# Patient Record
Sex: Male | Born: 1965 | Hispanic: No | Smoking: Former smoker
Health system: Southern US, Community
[De-identification: ages and names within clinical notes are randomized; demographics above are authoritative.]

## PROBLEM LIST (undated history)

## (undated) DIAGNOSIS — E113419 Type 2 diabetes mellitus with severe nonproliferative diabetic retinopathy with macular edema, unspecified eye: Secondary | ICD-10-CM

## (undated) DIAGNOSIS — E119 Type 2 diabetes mellitus without complications: Secondary | ICD-10-CM

## (undated) DIAGNOSIS — R269 Unspecified abnormalities of gait and mobility: Secondary | ICD-10-CM

## (undated) DIAGNOSIS — E1122 Type 2 diabetes mellitus with diabetic chronic kidney disease: Secondary | ICD-10-CM

## (undated) DIAGNOSIS — Z95810 Presence of automatic (implantable) cardiac defibrillator: Secondary | ICD-10-CM

## (undated) DIAGNOSIS — N183 Chronic kidney disease, stage 3 (moderate): Secondary | ICD-10-CM

## (undated) DIAGNOSIS — Z794 Long term (current) use of insulin: Secondary | ICD-10-CM

## (undated) DIAGNOSIS — I129 Hypertensive chronic kidney disease with stage 1 through stage 4 chronic kidney disease, or unspecified chronic kidney disease: Secondary | ICD-10-CM

## (undated) DIAGNOSIS — G2581 Restless legs syndrome: Secondary | ICD-10-CM

## (undated) DIAGNOSIS — E1142 Type 2 diabetes mellitus with diabetic polyneuropathy: Secondary | ICD-10-CM

## (undated) DIAGNOSIS — I499 Cardiac arrhythmia, unspecified: Secondary | ICD-10-CM

## (undated) DIAGNOSIS — E1140 Type 2 diabetes mellitus with diabetic neuropathy, unspecified: Secondary | ICD-10-CM

## (undated) DIAGNOSIS — E785 Hyperlipidemia, unspecified: Secondary | ICD-10-CM

## (undated) DIAGNOSIS — E669 Obesity, unspecified: Secondary | ICD-10-CM

## (undated) DIAGNOSIS — M21371 Foot drop, right foot: Secondary | ICD-10-CM

## (undated) DIAGNOSIS — G5620 Lesion of ulnar nerve, unspecified upper limb: Secondary | ICD-10-CM

## (undated) DIAGNOSIS — F32A Depression, unspecified: Secondary | ICD-10-CM

## (undated) DIAGNOSIS — F329 Major depressive disorder, single episode, unspecified: Secondary | ICD-10-CM

## (undated) DIAGNOSIS — E1169 Type 2 diabetes mellitus with other specified complication: Secondary | ICD-10-CM

## (undated) DIAGNOSIS — L0390 Cellulitis, unspecified: Secondary | ICD-10-CM

## (undated) DIAGNOSIS — E1165 Type 2 diabetes mellitus with hyperglycemia: Secondary | ICD-10-CM

## (undated) DIAGNOSIS — G4762 Sleep related leg cramps: Secondary | ICD-10-CM

## (undated) DIAGNOSIS — K859 Acute pancreatitis without necrosis or infection, unspecified: Secondary | ICD-10-CM

## (undated) DIAGNOSIS — F419 Anxiety disorder, unspecified: Secondary | ICD-10-CM

## (undated) DIAGNOSIS — I2510 Atherosclerotic heart disease of native coronary artery without angina pectoris: Secondary | ICD-10-CM

## (undated) DIAGNOSIS — Z8739 Personal history of other diseases of the musculoskeletal system and connective tissue: Secondary | ICD-10-CM

## (undated) DIAGNOSIS — R4020 Unspecified coma: Secondary | ICD-10-CM

## (undated) DIAGNOSIS — E7800 Pure hypercholesterolemia, unspecified: Secondary | ICD-10-CM

## (undated) DIAGNOSIS — I4901 Ventricular fibrillation: Secondary | ICD-10-CM

## (undated) DIAGNOSIS — R51 Headache: Secondary | ICD-10-CM

## (undated) DIAGNOSIS — N189 Chronic kidney disease, unspecified: Secondary | ICD-10-CM

## (undated) DIAGNOSIS — G4733 Obstructive sleep apnea (adult) (pediatric): Secondary | ICD-10-CM

## (undated) DIAGNOSIS — Z950 Presence of cardiac pacemaker: Secondary | ICD-10-CM

## (undated) DIAGNOSIS — I255 Ischemic cardiomyopathy: Secondary | ICD-10-CM

## (undated) DIAGNOSIS — N184 Chronic kidney disease, stage 4 (severe): Secondary | ICD-10-CM

## (undated) DIAGNOSIS — G5601 Carpal tunnel syndrome, right upper limb: Secondary | ICD-10-CM

## (undated) DIAGNOSIS — H3320 Serous retinal detachment, unspecified eye: Secondary | ICD-10-CM

## (undated) DIAGNOSIS — I10 Essential (primary) hypertension: Secondary | ICD-10-CM

## (undated) DIAGNOSIS — G629 Polyneuropathy, unspecified: Secondary | ICD-10-CM

## (undated) DIAGNOSIS — G4761 Periodic limb movement disorder: Secondary | ICD-10-CM

## (undated) DIAGNOSIS — M21372 Foot drop, left foot: Secondary | ICD-10-CM

## (undated) DIAGNOSIS — E1111 Type 2 diabetes mellitus with ketoacidosis with coma: Secondary | ICD-10-CM

## (undated) DIAGNOSIS — E079 Disorder of thyroid, unspecified: Secondary | ICD-10-CM

## (undated) DIAGNOSIS — I5022 Chronic systolic (congestive) heart failure: Secondary | ICD-10-CM

## (undated) DIAGNOSIS — E1129 Type 2 diabetes mellitus with other diabetic kidney complication: Secondary | ICD-10-CM

## (undated) HISTORY — DX: Major depressive disorder, single episode, unspecified: F32.9

## (undated) HISTORY — DX: Pure hypercholesterolemia, unspecified: E78.00

## (undated) HISTORY — PX: OTHER SURGICAL HISTORY: SHX169

## (undated) HISTORY — DX: Ventricular fibrillation: I49.01

## (undated) HISTORY — DX: Anxiety disorder, unspecified: F41.9

## (undated) HISTORY — DX: Hypertensive chronic kidney disease with stage 1 through stage 4 chronic kidney disease, or unspecified chronic kidney disease: I12.9

## (undated) HISTORY — DX: Unspecified coma: R40.20

## (undated) HISTORY — DX: Hyperlipidemia, unspecified: E78.5

## (undated) HISTORY — DX: Personal history of other diseases of the musculoskeletal system and connective tissue: Z87.39

## (undated) HISTORY — DX: Lesion of ulnar nerve, unspecified upper limb: G56.20

## (undated) HISTORY — DX: Chronic kidney disease, stage 4 (severe): N18.4

## (undated) HISTORY — PX: CARDIAC DEFIBRILLATOR PLACEMENT: SHX171

## (undated) HISTORY — DX: Foot drop, right foot: M21.371

## (undated) HISTORY — PX: CORONARY ARTERY BYPASS GRAFT: SHX141

## (undated) HISTORY — DX: Type 2 diabetes mellitus with diabetic neuropathy, unspecified: E11.40

## (undated) HISTORY — DX: Chronic systolic (congestive) heart failure: I50.22

## (undated) HISTORY — DX: Acute pancreatitis without necrosis or infection, unspecified: K85.90

## (undated) HISTORY — DX: Unspecified abnormalities of gait and mobility: R26.9

## (undated) HISTORY — DX: Type 2 diabetes mellitus with severe nonproliferative diabetic retinopathy with macular edema, unspecified eye: E11.3419

## (undated) HISTORY — DX: Atherosclerotic heart disease of native coronary artery without angina pectoris: I25.10

## (undated) HISTORY — PX: CATARACT EXTRACTION: SUR2

## (undated) HISTORY — DX: Type 2 diabetes mellitus with diabetic chronic kidney disease: E11.22

## (undated) HISTORY — DX: Cellulitis, unspecified: L03.90

## (undated) HISTORY — DX: Depression, unspecified: F32.A

## (undated) HISTORY — DX: Long term (current) use of insulin: Z79.4

## (undated) HISTORY — DX: Type 2 diabetes mellitus with hyperglycemia: E11.65

## (undated) HISTORY — PX: CARDIAC SURGERY: SHX584

## (undated) HISTORY — DX: Sleep related leg cramps: G47.62

## (undated) HISTORY — DX: Ischemic cardiomyopathy: I25.5

## (undated) HISTORY — PX: RETINAL DETACHMENT SURGERY: SHX105

## (undated) HISTORY — PX: APPENDECTOMY: SHX54

## (undated) HISTORY — DX: Periodic limb movement disorder: G47.61

## (undated) HISTORY — DX: Chronic kidney disease, unspecified: N18.9

## (undated) HISTORY — DX: Type 2 diabetes mellitus with diabetic polyneuropathy: E11.42

## (undated) HISTORY — DX: Polyneuropathy, unspecified: G62.9

## (undated) HISTORY — DX: Obstructive sleep apnea (adult) (pediatric): G47.33

## (undated) HISTORY — DX: Carpal tunnel syndrome, right upper limb: G56.01

## (undated) HISTORY — DX: Obesity, unspecified: E66.9

## (undated) HISTORY — DX: Restless legs syndrome: G25.81

## (undated) HISTORY — PX: PACEMAKER PLACEMENT: SHX43

## (undated) HISTORY — DX: Serous retinal detachment, unspecified eye: H33.20

## (undated) HISTORY — DX: Type 2 diabetes mellitus without complications: E11.9

## (undated) HISTORY — DX: Essential (primary) hypertension: I10

## (undated) HISTORY — DX: Type 2 diabetes mellitus with ketoacidosis with coma: E11.11

## (undated) HISTORY — DX: Type 2 diabetes mellitus with other diabetic kidney complication: E11.29

## (undated) HISTORY — DX: Chronic kidney disease, stage 3 (moderate): N18.3

## (undated) HISTORY — DX: Foot drop, left foot: M21.372

## (undated) HISTORY — DX: Type 2 diabetes mellitus with other specified complication: E11.69

## (undated) HISTORY — PX: ULNAR NERVE TRANSPOSITION: SHX2595

---

## 1996-03-06 HISTORY — PX: TONSILLECTOMY: SUR1361

## 2003-02-19 ENCOUNTER — Ambulatory Visit (HOSPITAL_COMMUNITY): Admission: RE | Admit: 2003-02-19 | Discharge: 2003-02-19 | Payer: Self-pay | Admitting: Family Medicine

## 2004-02-15 ENCOUNTER — Ambulatory Visit: Payer: Self-pay | Admitting: Family Medicine

## 2004-05-16 ENCOUNTER — Ambulatory Visit: Payer: Self-pay | Admitting: Family Medicine

## 2005-09-27 ENCOUNTER — Ambulatory Visit: Payer: Self-pay | Admitting: Family Medicine

## 2005-09-28 ENCOUNTER — Encounter (INDEPENDENT_AMBULATORY_CARE_PROVIDER_SITE_OTHER): Payer: Self-pay | Admitting: *Deleted

## 2005-09-28 LAB — CONVERTED CEMR LAB: PSA: 0.31 ng/mL

## 2005-09-29 ENCOUNTER — Ambulatory Visit (HOSPITAL_COMMUNITY): Admission: RE | Admit: 2005-09-29 | Discharge: 2005-09-29 | Payer: Self-pay | Admitting: Family Medicine

## 2005-11-01 ENCOUNTER — Ambulatory Visit: Payer: Self-pay | Admitting: Family Medicine

## 2006-03-06 DIAGNOSIS — K859 Acute pancreatitis without necrosis or infection, unspecified: Secondary | ICD-10-CM

## 2006-03-06 HISTORY — DX: Acute pancreatitis without necrosis or infection, unspecified: K85.90

## 2006-08-06 ENCOUNTER — Ambulatory Visit: Payer: Self-pay | Admitting: Family Medicine

## 2007-02-22 ENCOUNTER — Encounter (INDEPENDENT_AMBULATORY_CARE_PROVIDER_SITE_OTHER): Payer: Self-pay | Admitting: *Deleted

## 2007-03-07 ENCOUNTER — Encounter: Payer: Self-pay | Admitting: Family Medicine

## 2007-07-10 DIAGNOSIS — E785 Hyperlipidemia, unspecified: Secondary | ICD-10-CM | POA: Insufficient documentation

## 2007-07-10 DIAGNOSIS — F17200 Nicotine dependence, unspecified, uncomplicated: Secondary | ICD-10-CM

## 2008-05-04 ENCOUNTER — Ambulatory Visit: Payer: Self-pay | Admitting: Family Medicine

## 2008-05-04 DIAGNOSIS — Z794 Long term (current) use of insulin: Secondary | ICD-10-CM

## 2008-05-04 DIAGNOSIS — E1165 Type 2 diabetes mellitus with hyperglycemia: Secondary | ICD-10-CM

## 2008-05-04 DIAGNOSIS — F418 Other specified anxiety disorders: Secondary | ICD-10-CM | POA: Insufficient documentation

## 2008-05-04 DIAGNOSIS — IMO0001 Reserved for inherently not codable concepts without codable children: Secondary | ICD-10-CM | POA: Insufficient documentation

## 2008-05-04 LAB — HM DIABETES FOOT EXAM

## 2008-05-04 LAB — CONVERTED CEMR LAB: Glucose, Bld: 375 mg/dL

## 2008-05-05 ENCOUNTER — Encounter: Payer: Self-pay | Admitting: Family Medicine

## 2008-05-05 LAB — CONVERTED CEMR LAB: Microalb, Ur: 7.92 mg/dL — ABNORMAL HIGH (ref 0.00–1.89)

## 2008-05-07 ENCOUNTER — Encounter: Payer: Self-pay | Admitting: Family Medicine

## 2008-05-08 ENCOUNTER — Encounter: Payer: Self-pay | Admitting: Family Medicine

## 2008-05-11 LAB — CONVERTED CEMR LAB
ALT: 17 units/L (ref 0–53)
Alkaline Phosphatase: 78 units/L (ref 39–117)
BUN: 17 mg/dL (ref 6–23)
Band Neutrophils: 0 % (ref 0–10)
Basophils Absolute: 0 10*3/uL (ref 0.0–0.1)
Cholesterol: 250 mg/dL — ABNORMAL HIGH (ref 0–200)
Creatinine, Ser: 0.98 mg/dL (ref 0.40–1.50)
Eosinophils Absolute: 0 10*3/uL (ref 0.0–0.7)
Eosinophils Relative: 1 % (ref 0–5)
Indirect Bilirubin: 0.6 mg/dL (ref 0.0–0.9)
MCHC: 33.3 g/dL (ref 30.0–36.0)
MCV: 82.7 fL (ref 78.0–100.0)
Platelets: 172 10*3/uL (ref 150–400)
Total Protein: 7.4 g/dL (ref 6.0–8.3)
Triglycerides: 288 mg/dL — ABNORMAL HIGH (ref <150)
WBC: 7.7 10*3/uL (ref 4.0–10.5)

## 2008-06-02 ENCOUNTER — Encounter: Payer: Self-pay | Admitting: Family Medicine

## 2008-06-15 ENCOUNTER — Telehealth: Payer: Self-pay | Admitting: Family Medicine

## 2008-06-22 ENCOUNTER — Telehealth: Payer: Self-pay | Admitting: Family Medicine

## 2009-08-23 ENCOUNTER — Ambulatory Visit: Payer: Self-pay | Admitting: Internal Medicine

## 2009-08-23 ENCOUNTER — Inpatient Hospital Stay (HOSPITAL_COMMUNITY): Admission: AD | Admit: 2009-08-23 | Discharge: 2009-09-09 | Payer: Self-pay | Admitting: Cardiology

## 2009-08-23 ENCOUNTER — Encounter: Payer: Self-pay | Admitting: Family Medicine

## 2009-08-23 ENCOUNTER — Encounter: Payer: Self-pay | Admitting: Cardiology

## 2009-08-23 ENCOUNTER — Ambulatory Visit: Payer: Self-pay | Admitting: Pulmonary Disease

## 2009-08-24 ENCOUNTER — Ambulatory Visit: Payer: Self-pay | Admitting: Cardiothoracic Surgery

## 2009-08-24 ENCOUNTER — Encounter: Payer: Self-pay | Admitting: Cardiovascular Disease

## 2009-08-25 ENCOUNTER — Encounter: Payer: Self-pay | Admitting: Cardiothoracic Surgery

## 2009-08-25 ENCOUNTER — Ambulatory Visit: Payer: Self-pay | Admitting: Vascular Surgery

## 2009-08-26 ENCOUNTER — Encounter: Payer: Self-pay | Admitting: Cardiovascular Disease

## 2009-08-26 ENCOUNTER — Encounter: Payer: Self-pay | Admitting: Cardiology

## 2009-08-26 DIAGNOSIS — I4901 Ventricular fibrillation: Secondary | ICD-10-CM

## 2009-08-26 HISTORY — DX: Ventricular fibrillation: I49.01

## 2009-08-27 ENCOUNTER — Encounter: Payer: Self-pay | Admitting: Cardiology

## 2009-08-30 ENCOUNTER — Encounter: Payer: Self-pay | Admitting: Cardiology

## 2009-09-01 HISTORY — PX: OTHER SURGICAL HISTORY: SHX169

## 2009-09-08 ENCOUNTER — Encounter: Payer: Self-pay | Admitting: Family Medicine

## 2009-09-09 ENCOUNTER — Encounter: Payer: Self-pay | Admitting: Internal Medicine

## 2009-09-14 ENCOUNTER — Ambulatory Visit: Payer: Self-pay | Admitting: Family Medicine

## 2009-09-14 DIAGNOSIS — R5381 Other malaise: Secondary | ICD-10-CM

## 2009-09-14 DIAGNOSIS — R5383 Other fatigue: Secondary | ICD-10-CM

## 2009-09-15 ENCOUNTER — Encounter: Payer: Self-pay | Admitting: Family Medicine

## 2009-09-20 ENCOUNTER — Encounter: Admission: RE | Admit: 2009-09-20 | Discharge: 2009-09-20 | Payer: Self-pay | Admitting: Cardiothoracic Surgery

## 2009-09-20 ENCOUNTER — Ambulatory Visit: Payer: Self-pay | Admitting: Cardiothoracic Surgery

## 2009-09-20 ENCOUNTER — Encounter: Payer: Self-pay | Admitting: Cardiovascular Disease

## 2009-09-20 ENCOUNTER — Encounter: Payer: Self-pay | Admitting: Internal Medicine

## 2009-09-20 DIAGNOSIS — I2510 Atherosclerotic heart disease of native coronary artery without angina pectoris: Secondary | ICD-10-CM | POA: Insufficient documentation

## 2009-09-23 ENCOUNTER — Ambulatory Visit: Payer: Self-pay

## 2009-09-23 ENCOUNTER — Ambulatory Visit: Payer: Self-pay | Admitting: Internal Medicine

## 2009-09-24 LAB — CONVERTED CEMR LAB
CO2: 32 meq/L (ref 19–32)
Calcium: 8.6 mg/dL (ref 8.4–10.5)
Creatinine, Ser: 1.4 mg/dL (ref 0.4–1.5)
Glucose, Bld: 223 mg/dL — ABNORMAL HIGH (ref 70–99)
Sodium: 138 meq/L (ref 135–145)

## 2009-09-27 ENCOUNTER — Telehealth (INDEPENDENT_AMBULATORY_CARE_PROVIDER_SITE_OTHER): Payer: Self-pay | Admitting: *Deleted

## 2009-09-30 ENCOUNTER — Ambulatory Visit: Payer: Self-pay | Admitting: Cardiothoracic Surgery

## 2009-09-30 ENCOUNTER — Encounter: Admission: RE | Admit: 2009-09-30 | Discharge: 2009-09-30 | Payer: Self-pay | Admitting: Cardiothoracic Surgery

## 2009-09-30 ENCOUNTER — Encounter: Payer: Self-pay | Admitting: Family Medicine

## 2009-10-08 ENCOUNTER — Telehealth: Payer: Self-pay | Admitting: Family Medicine

## 2009-10-08 ENCOUNTER — Telehealth: Payer: Self-pay | Admitting: Internal Medicine

## 2009-10-11 ENCOUNTER — Telehealth: Payer: Self-pay | Admitting: Internal Medicine

## 2009-10-11 ENCOUNTER — Telehealth: Payer: Self-pay | Admitting: Family Medicine

## 2009-10-27 ENCOUNTER — Encounter: Payer: Self-pay | Admitting: Family Medicine

## 2009-11-02 ENCOUNTER — Ambulatory Visit: Payer: Self-pay | Admitting: Cardiovascular Disease

## 2009-11-12 ENCOUNTER — Telehealth: Payer: Self-pay | Admitting: Family Medicine

## 2009-11-12 ENCOUNTER — Telehealth: Payer: Self-pay | Admitting: Internal Medicine

## 2009-11-18 ENCOUNTER — Encounter: Payer: Self-pay | Admitting: Family Medicine

## 2009-11-25 ENCOUNTER — Ambulatory Visit: Payer: Self-pay | Admitting: Cardiovascular Disease

## 2009-12-06 ENCOUNTER — Encounter: Payer: Self-pay | Admitting: Family Medicine

## 2009-12-08 LAB — CONVERTED CEMR LAB
AST: 17 units/L (ref 0–37)
CO2: 27 meq/L (ref 19–32)
Calcium: 9 mg/dL (ref 8.4–10.5)
Chloride: 100 meq/L (ref 96–112)
Glucose, Bld: 99 mg/dL (ref 70–99)
HDL: 30.5 mg/dL — ABNORMAL LOW (ref >39.00)
Potassium: 4.6 meq/L (ref 3.5–5.1)
Sodium: 137 meq/L (ref 135–145)
Total Bilirubin: 0.3 mg/dL (ref 0.3–1.2)
Total CHOL/HDL Ratio: 5

## 2009-12-28 ENCOUNTER — Ambulatory Visit (HOSPITAL_COMMUNITY): Admission: RE | Admit: 2009-12-28 | Discharge: 2009-12-28 | Payer: Self-pay | Admitting: Cardiovascular Disease

## 2009-12-28 ENCOUNTER — Ambulatory Visit: Payer: Self-pay | Admitting: Cardiovascular Disease

## 2009-12-28 ENCOUNTER — Encounter: Payer: Self-pay | Admitting: Cardiovascular Disease

## 2009-12-28 ENCOUNTER — Ambulatory Visit: Payer: Self-pay

## 2009-12-28 DIAGNOSIS — I472 Ventricular tachycardia: Secondary | ICD-10-CM

## 2009-12-28 DIAGNOSIS — I10 Essential (primary) hypertension: Secondary | ICD-10-CM | POA: Insufficient documentation

## 2009-12-30 ENCOUNTER — Encounter: Payer: Self-pay | Admitting: Cardiovascular Disease

## 2010-01-04 ENCOUNTER — Ambulatory Visit: Payer: Self-pay | Admitting: Family Medicine

## 2010-01-14 ENCOUNTER — Telehealth (INDEPENDENT_AMBULATORY_CARE_PROVIDER_SITE_OTHER): Payer: Self-pay | Admitting: *Deleted

## 2010-03-06 DIAGNOSIS — L0390 Cellulitis, unspecified: Secondary | ICD-10-CM

## 2010-03-06 HISTORY — DX: Cellulitis, unspecified: L03.90

## 2010-03-10 ENCOUNTER — Ambulatory Visit
Admission: RE | Admit: 2010-03-10 | Discharge: 2010-03-10 | Payer: Self-pay | Source: Home / Self Care | Attending: Internal Medicine | Admitting: Internal Medicine

## 2010-03-10 ENCOUNTER — Encounter: Payer: Self-pay | Admitting: Internal Medicine

## 2010-03-10 DIAGNOSIS — Z95810 Presence of automatic (implantable) cardiac defibrillator: Secondary | ICD-10-CM | POA: Insufficient documentation

## 2010-04-05 NOTE — Progress Notes (Signed)
Summary: medication assistance  Phone Note Call from Patient   Summary of Call: Patient would like for [place] to try to use the Health Dept. to get him assistance for his medication.  Please advise. Initial call taken by: Curtis Sites,  November 12, 2009 11:30 AM  Follow-up for Phone Call        returned call, no answer Follow-up by: Adella Hare LPN,  November 12, 2009 11:43 AM  Additional Follow-up for Phone Call Additional follow up Details #1::        patient aware will refer Additional Follow-up by: Adella Hare LPN,  November 12, 2009 1:08 PM    Prescriptions: LANTUS 100 UNIT/ML SOLN (INSULIN GLARGINE) 30 units at bedtime  #90 day supp x 3   Entered by:   Adella Hare LPN   Authorized by:   Syliva Overman MD   Signed by:   Adella Hare LPN on 95/28/4132   Method used:   Printed then faxed to ...       [facility] (retail)       [address]       [place], [place]  [address]       Ph: [phone]       Fax: [phone]   RxID:   [id number] LANTUS 100 UNIT/ML SOLN (INSULIN GLARGINE) 30 units at bedtime  #90 day supp x 3   Entered by:   Adella Hare LPN   Authorized by:   Syliva Overman MD   Signed by:   Adella Hare LPN on 60/63/0160   Method used:   Electronically to        [facility] (retail)       [address]       [place], [place]  [address]       Ph: [phone]       Fax: [phone]   RxID:   [id number]  [facility] cancelled

## 2010-04-05 NOTE — Letter (Signed)
Summary: OFFICE NOTES  OFFICE NOTES   Imported By: Lind Guest 09/27/2009 16:41:50  _____________________________________________________________________  External Attachment:    Type:   Image     Comment:   External Document

## 2010-04-05 NOTE — Letter (Signed)
Summary: RX ASSISTANCE  RX ASSISTANCE   Imported By: Lind Guest 10/27/2009 10:59:35  _____________________________________________________________________  External Attachment:    Type:   Image     Comment:   External Document

## 2010-04-05 NOTE — Letter (Signed)
Summary: Discharge Summary  Discharge Summary   Imported By: Lind Guest 09/15/2009 11:37:02  _____________________________________________________________________  External Attachment:    Type:   Image     Comment:   External Document

## 2010-04-05 NOTE — Letter (Signed)
Summary: rx assistance  rx assistance   Imported By: Lind Guest 12/07/2009 13:39:33  _____________________________________________________________________  External Attachment:    Type:   Image     Comment:   External Document

## 2010-04-05 NOTE — Medication Information (Signed)
Summary: Physician Orders   Physician Orders   Imported By: Roderic Ovens 10/04/2009 12:27:03  _____________________________________________________________________  External Attachment:    Type:   Image     Comment:   External Document

## 2010-04-05 NOTE — Letter (Signed)
Summary: crestor  crestor   Imported By: Lind Guest 01/05/2010 11:33:38  _____________________________________________________________________  External Attachment:    Type:   Image     Comment:   External Document

## 2010-04-05 NOTE — Progress Notes (Signed)
Summary: cost of meds is high can they be any gentric  Phone Note Call from Patient Call back at Home Phone [phone] Call back at [phone]    Caller: Patient Reason for Call: Talk to Nurse Details for Reason: The cost of meds is high. can they be any gentric for  crestor, METOPROLOL, AMIODARONE.  Summary of Call: ok for Simvastatin 10mg  daily  Initial call taken by: Lorne Skeens,  October 08, 2009 9:14 AM  Follow-up for Phone Call        Can we change cholesterol med to generic.  No insurance and meds are costing him a ton.  Call in to [facility] in [facility], RN, BSN  October 08, 2009 12:27 PM ok to change to Simvastatin 10mg  daily  called into [facility] in [place] pt aware Dennis Bast, RN, BSN  October 08, 2009 4:39 PM    New/Updated Medications: SIMVASTATIN 10 MG TABS (SIMVASTATIN) one by mouth daily Prescriptions: SIMVASTATIN 10 MG TABS (SIMVASTATIN) one by mouth daily  #30 x 11   Entered by:   Dennis Bast, RN, BSN   Authorized by:   Laren Boom, MD, [facility]   Signed by:   Dennis Bast, RN, BSN on 10/08/2009   Method used:   Electronically to        [facility]  [address] (retail)       [address]       [place], [place]  [address]       Ph: [phone]       Fax: [phone]   RxID:   [id number]

## 2010-04-05 NOTE — Letter (Signed)
Summary: DEMO  DEMO   Imported By: Lind Guest 09/27/2009 16:42:40  _____________________________________________________________________  External Attachment:    Type:   Image     Comment:   External Document

## 2010-04-05 NOTE — Assessment & Plan Note (Signed)
Summary: f up from hos   Vital Signs:  Patient profile:   45 year old male Height:      69 inches Weight:      259 pounds BMI:     38.39 O2 Sat:      95 % Pulse rate:   83 / minute Pulse rhythm:   regular Resp:     16 per minute BP sitting:   120 / 70  (right arm)  Vitals Entered By: Everitt Amber LPN (September 14, 2009 1:57 PM)  Nutrition Counseling: Patient's BMI is greater than 25 and therefore counseled on weight management options. CC: Hospital follow up   CC:  Hospital follow up.  History of Present Illness: pt in for re-esttablishmentof care. He was recently hospitalised for chest pain , which led to a dx of cAD requiring CABG x 4. he wenton to have v fib, and he did have an iCD placed.' He is still experienceing significant fatigue, which is not a surprise, he willstart cardiac rehab once cleared to do so by his cardiologist. He has not been following his meds , labs or healthcare much in the past over 3 years due to lack of health care coverage. When he was last seen by me his diabetes was uncontrolled , as was his lipid status. He was using nicotine products up to the time of his recent hospitalisation. though he reports 'being shaken" by the recnet events, he states since he has in the past been in a near death situation...diabetic coma and pancreatitis, he will not let this stop him from believing that he can recover full function.   Current Medications (verified): 1)  Glipizide 10 Mg Tabs (Glipizide) .... Take 1 Tablet By Mouth Two Times A Day 2)  Amiodarone Hcl 200 Mg Tabs (Amiodarone Hcl) .... Take 1 Tablet By Mouth Two Times A Day 3)  Aspirin 325 Mg Tabs (Aspirin) .... Take 1 Tablet By Mouth Once A Day 4)  Lantus 100 Unit/ml Soln (Insulin Glargine) .... 30 Units At Bedtime 5)  Metoprolol Succinate 25 Mg Xr24h-Tab (Metoprolol Succinate) .... 1/2 Tab Two Times A Day 6)  Oxycodone Hcl 5 Mg Tabs (Oxycodone Hcl) .... Take One Tab Q 3 Hrs As Needed 7)  Crestor 40 Mg Tabs  (Rosuvastatin Calcium) .... Take 1 Tab By Mouth At Bedtime  Allergies (verified): No Known Drug Allergies  Review of Systems      See HPI General:  Complains of fatigue and weakness; denies chills and fever. Eyes:  Denies discharge and red eye. ENT:  Denies hoarseness and nasal congestion. Resp:  Complains of shortness of breath; denies cough and sputum productive. GI:  Denies abdominal pain, constipation, diarrhea, nausea, and vomiting. GU:  Denies dysuria and urinary frequency. MS:  Denies joint pain and stiffness. Derm:  Denies itching, lesion(s), and rash. Neuro:  Denies headaches, seizures, sensation of room spinning, and tingling. Psych:  Complains of anxiety; denies depression, mental problems, suicidal thoughts/plans, thoughts of violence, and unusual visions or sounds. Endo:  Denies excessive thirst and excessive urination. Heme:  Denies abnormal bruising and bleeding. Allergy:  Denies hives or rash and itching eyes.  Physical Exam  General:  Well-developed,overweight,in no acute distress; alert,appropriate and cooperative throughout examination HEENT: No facial asymmetry,  EOMI, No sinus tenderness, TM's Clear, oropharynx  pink and moist.   Chest: Clear to auscultation bilaterally.  CVS: S1, S2, No murmurs, No S3.   Abd: Soft, Nontender.  MS: Adequate ROM spine, hips, shoulders and knees.  Ext: No edema.   CNS: CN 2-12 intact, power tone and sensation normal throughout.   Skin: Intact, no visible lesions or rashes.  Psych: Good eye contact, normal affect.  Memory intact, not anxious or depressed appearing.    Impression & Recommendations:  Problem # 1:  FATIGUE (ICD-780.79) Assessment Deteriorated  Orders: T-Basic Metabolic Panel [id number])  Problem # 2:  DEPRESSION (ICD-311) Assessment: Improved  The following medications were removed from the medication list:    Fluoxetine Hcl 10 Mg Tabs (Fluoxetine hcl) .Marland Kitchen... Take 1 tablet by mouth once a  day  Problem # 3:  NICOTINE ADDICTION (ICD-305.1) Assessment: Comment Only pt using nicotine free substitute in the opast 2 weeks  Problem # 4:  OBESITY (ICD-278.00) Assessment: Comment Only  Ht: 69 (09/14/2009)   Wt: 259 (09/14/2009)   BMI: 38.39 (09/14/2009), low carb diet with reduced calories recommended  Problem # 5:  CAD (ICD-414.00) Assessment: Comment Only  His updated medication list for this problem includes:    Amiodarone Hcl 200 Mg Tabs (Amiodarone hcl) .Marland Kitchen... Take 1 tablet by mouth two times a day    Aspirin 325 Mg Tabs (Aspirin) .Marland Kitchen... Take 1 tablet by mouth once a day    Metoprolol Succinate 25 Mg Xr24h-tab (Metoprolol succinate) .Marland Kitchen... 1/2 tab two times a day recent 3 vessel ds  has ahd cABG and ICD placement secondary to v fib arrest in hiospital  Complete Medication List: 1)  Glipizide 10 Mg Tabs (Glipizide) .... Take 1 tablet by mouth two times a day 2)  Amiodarone Hcl 200 Mg Tabs (Amiodarone hcl) .... Take 1 tablet by mouth two times a day 3)  Aspirin 325 Mg Tabs (Aspirin) .... Take 1 tablet by mouth once a day 4)  Lantus 100 Unit/ml Soln (Insulin glargine) .... 30 units at bedtime 5)  Metoprolol Succinate 25 Mg Xr24h-tab (Metoprolol succinate) .... 1/2 tab two times a day 6)  Oxycodone Hcl 5 Mg Tabs (Oxycodone hcl) .... Take one tab q 3 hrs as needed 7)  Crestor 40 Mg Tabs (Rosuvastatin calcium) .... Take 1 tab by mouth at bedtime  Other Orders: T-Hepatic Function [phone]) T-Lipid Profile [phone]) T- Hemoglobin A1C ([id number])  Patient Instructions: 1)  Please schedule a follow-up appointment in 2.5 months. 2)  You need to lose weight. Consider a lower calorie diet and regular exercise.  3)  Check your blood sugars regularly. If your readings are usually above250: or below 70 you should contact our office. 4)  Continue meds from the hospital 5)  BMP prior to visit, ICD-9: 6)  Hepatic Panel prior to visit, ICD-9:   7)  Lipid Panel prior to visit,  ICD-9:   fasting labs in 2.5 months 8)  HbgA1C prior to visit, ICD-9: 9)  I wil send for crestor for you and attempt to get lantus also we will call when we have samples available 10)  i am thankful that you recivered from so serious an illness

## 2010-04-05 NOTE — Letter (Signed)
Summary: HISTORY AND PHYSICAL  HISTORY AND PHYSICAL   Imported By: Lind Guest 09/27/2009 16:41:25  _____________________________________________________________________  External Attachment:    Type:   Image     Comment:   External Document

## 2010-04-05 NOTE — Letter (Signed)
Summary: CONSULTS  CONSULTS   Imported By: Lind Guest 09/27/2009 16:40:44  _____________________________________________________________________  External Attachment:    Type:   Image     Comment:   External Document

## 2010-04-05 NOTE — Procedures (Signed)
Summary: [facility] per pt call/lg   Current Medications (verified): 1)  Glipizide 10 Mg Tabs (Glipizide) .... Take 1 Tablet By Mouth Two Times A Day 2)  Amiodarone Hcl 200 Mg Tabs (Amiodarone Hcl) .... Take 1 Tablet By Mouth Two Times A Day 3)  Aspirin 325 Mg Tabs (Aspirin) .... Take 1 Tablet By Mouth Once A Day 4)  Lantus 100 Unit/ml Soln (Insulin Glargine) .... 30 Units At Bedtime 5)  Metoprolol Succinate 25 Mg Xr24h-Tab (Metoprolol Succinate) .... 1/2 Tab Two Times A Day 6)  Oxycodone Hcl 5 Mg Tabs (Oxycodone Hcl) .... Take One Tab Q 3 Hrs As Needed 7)  Crestor 40 Mg Tabs (Rosuvastatin Calcium) .... Take 1 Tab By Mouth At Bedtime 8)  Furosemide 80 Mg Tabs (Furosemide) .... Take 1 Tablet By Mouth Two Times A Day  Allergies (verified): No Known Drug Allergies   ICD Specifications Following MD:  Lewayne Bunting, MD     ICD Vendor:  [facility]     ICD Model Number:  E102     ICD Serial Number:  [id number] ICD DOI:  09/08/2009      Lead 1:    Location: RA     DOI: 09/08/2009     Status: active Lead 2:    Location: RV     DOI: 09/08/2009     Model #: 1601     Serial #: [id number]     Status: active  Indications::  VF   ICD Follow Up Battery Voltage:  GOOD V     Charge Time:  8.1 seconds     Battery Est. Longevity:  8.5 YRS Underlying rhythm:  SR   ICD Device Measurements Atrium:  Amplitude: 1.6 mV, Impedance: 463 ohms, Threshold: 0.5 V at 0.4 msec Right Ventricle:  Amplitude: 20.4 mV, Impedance: 543 ohms, Threshold: 0.9 V at 0.4 msec Shock Impedance: 42 ohms   Episodes MS Episodes:  0     Shock:  0     ATP:  0     Nonsustained:  0     Atrial Therapies:  0 Atrial Pacing:  <1%     Ventricular Pacing:  <1%  Brady Parameters Mode DDD     Lower Rate Limit:  50     Upper Rate Limit 120 PAV 260-400     Sensed AV Delay:  260-400  Tachy Zones VF:  210     VT:  180     Next Cardiology Appt Due:  12/20/2009 Tech Comments:  WOUND CHECK--STERI STRIPS REMOVED BY PT.  NO SWELLING OR REDNESS AT  SITE.  NORMAL DEVICE FUNCTION.  NO CHANGES MADE.  ROV IN 12-20-09 W/GT.  PT TO SCHEDULE OV W/DR COOPER FOR HOSP FUP.  Vella Kohler  September 23, 2009 3:35 PM

## 2010-04-05 NOTE — Progress Notes (Signed)
Summary: rx sent to wrong pharmacy  Phone Note Call from Patient   Caller: Patient Reason for Call: Talk to Nurse Summary of Call: pt's rx for simvastatin was sent to the wrong pharmacy-[facility] to [facility] [place] Initial call taken by: Glynda Jaeger,  October 11, 2009 9:51 AM    Prescriptions: SIMVASTATIN 10 MG TABS (SIMVASTATIN) one by mouth daily  #30 x 11   Entered by:   Laurance Flatten CMA   Authorized by:   Laren Boom, MD, [facility]   Signed by:   Laurance Flatten CMA on 10/12/2009   Method used:   Electronically to        [facility]  [address] (retail)       [address]       [place], [place]  [address]       Ph: [phone]       Fax: [phone]   RxID:   [id number]

## 2010-04-05 NOTE — Letter (Signed)
Summary: [facility]   [facility]   Imported By: Marylou Mccoy 10/18/2009 14:08:26  _____________________________________________________________________  External Attachment:    Type:   Image     Comment:   External Document

## 2010-04-05 NOTE — Progress Notes (Signed)
Summary: refill  Phone Note Refill Request Message from:  Patient on November 12, 2009 8:09 AM  Refills Requested: Medication #1:  METOPROLOL TARTRATE 25 MG TABS Take  1/2  tablet by mouth twice a day  Medication #2:  AMIODARONE HCL 200 MG TABS Take one tablet by mouth twice a day. send to [facility] [phone]  Initial call taken by: Judie Grieve,  November 12, 2009 8:10 AM    Prescriptions: AMIODARONE HCL 200 MG TABS (AMIODARONE HCL) Take one tablet by mouth twice a day  #60 x 5   Entered by:   Laurance Flatten CMA   Authorized by:   Laren Boom, MD, [facility]   Signed by:   Laurance Flatten CMA on 11/12/2009   Method used:   Electronically to        [facility] (retail)       [address]       [place], [place]  [address]       Ph: [phone]       Fax: [phone]   RxID:   [id number] METOPROLOL TARTRATE 25 MG TABS (METOPROLOL TARTRATE) Take  1/2  tablet by mouth twice a day  #30 x 5   Entered by:   Laurance Flatten CMA   Authorized by:   Laren Boom, MD, [facility]   Signed by:   Laurance Flatten CMA on 11/12/2009   Method used:   Electronically to        [facility] (retail)       [address]       [place], [place]  [address]       Ph: [phone]       Fax: [phone]   RxID:   [id number]

## 2010-04-05 NOTE — Progress Notes (Signed)
Summary: lantus samples  Phone Note Call from Patient   Summary of Call: [phone] wanted to know if lantus samples came in. Initial call taken by: Curtis Sites,  October 08, 2009 9:21 AM  Follow-up for Phone Call        there are no lantus samples here. I left Marijean Niemann a message to call him as soon as they come Follow-up by: Everitt Amber LPN,  October 08, 2009 10:22 AM

## 2010-04-05 NOTE — Miscellaneous (Signed)
Summary: Orders Update  Clinical Lists Changes  Orders: Added new Test order of TLB-BMP (Basic Metabolic Panel-BMET) ([id number]) - Signed 

## 2010-04-05 NOTE — Letter (Signed)
Summary: Discharge Summary  Discharge Summary   Imported By: Lind Guest 09/15/2009 11:39:10  _____________________________________________________________________  External Attachment:    Type:   Image     Comment:   External Document

## 2010-04-05 NOTE — Cardiovascular Report (Signed)
Summary: Office Visit   Office Visit   Imported By: Roderic Ovens 10/05/2009 15:28:45  _____________________________________________________________________  External Attachment:    Type:   Image     Comment:   External Document

## 2010-04-05 NOTE — Letter (Signed)
Summary: LABS  LABS   Imported By: Lind Guest 09/27/2009 16:42:14  _____________________________________________________________________  External Attachment:    Type:   Image     Comment:   External Document

## 2010-04-05 NOTE — Letter (Signed)
Summary: Historic Patient File  Historic Patient File   Imported By: Lind Guest 09/27/2009 15:49:29  _____________________________________________________________________  External Attachment:    Type:   Image     Comment:   External Document

## 2010-04-05 NOTE — Consult Note (Signed)
Summary: Consultation Report  Consultation Report   Imported By: Debby Freiberg 08/28/2009 12:13:54  _____________________________________________________________________  External Attachment:    Type:   Image     Comment:   External Document

## 2010-04-05 NOTE — Assessment & Plan Note (Signed)
Summary: ROV   Visit Type:  Follow-up Primary Sadae Arrazola:  Matthew Overman MD  CC:  none.  History of Present Illness: 45 year-old male presents for hospital follow-up evaluation after sustaining an out-of-hospital MI. He presented with post-MI CHF and ultimately underwent cardiac cath demonstrating severe 3 vessel CAD. While he was awaiting CABG (for plavix washout), he had a VF arrest leading to multiorgan dysfunction/mechanical ventilation, etc. He was treated aggressively and ultimately was taken for CABG electively following his recovery from the VF arrest. He then underwent ICD placement after CABG for secondary prevention of VT/VF. He has done well since hospital discharge and has no specific complaints at present. He denies chest pain, dyspnea, orthopnea, or PND.  Current Medications (verified): 1)  Glipizide 10 Mg Tabs (Glipizide) .... Take 1 Tablet By Mouth Two Times A Day 2)  Amiodarone Hcl 200 Mg Tabs (Amiodarone Hcl) .... Take 1 Tablet By Mouth Two Times A Day 3)  Aspirin 325 Mg Tabs (Aspirin) .... Take 1 Tablet By Mouth Once A Day 4)  Lantus 100 Unit/ml Soln (Insulin Glargine) .... 30 Units At Bedtime 5)  Metoprolol Tartrate 25 Mg Tabs (Metoprolol Tartrate) .... Take  1/2  Tablet By Mouth Twice A Day 6)  Crestor 40 Mg Tabs (Rosuvastatin Calcium) .... Take One Tablet By Mouth Daily. 7)  L-Carnitine 250 Mg Caps (Levocarnitine) .... Take 1 Capsule By Mouth Once A Day 8)  Multivitamins  Tabs (Multiple Vitamin) .... Take 1 Tablet By Mouth Once A Day 9)  B Complex  Tabs (B Complex Vitamins) .... Take 1 Tablet By Mouth Once A Day  Allergies (verified): No Known Drug Allergies  Past History:  Past medical history reviewed for relevance to current acute and chronic problems.  Past Medical History: Reviewed history from 10/28/2009 and no changes required.  1. Ventricular fibrillation at rest, initial event August 26, 2009.   2. Acute respiratory failure in setting of cardiac arrest  and       pulmonary edema.   3. Encephalopathy, postanoxic agitation.   4. Acute on chronic renal insufficiency postoperatively.   5. Acute on chronic blood loss anemia postoperatively.   6. Volume overload postoperatively.   7. Sleep apnea.   8. History of diabetic ketoacidosis occurring in 1994 where he spent       30 days in a coma.  He developed infection of his leg and       developing pancreatitis during that time.      Review of Systems       Negative except as per HPI   Vital Signs:  Patient profile:   45 year old male Height:      69 inches Weight:      244 pounds BMI:     36.16 Pulse rate:   64 / minute Pulse rhythm:   regular Resp:     18 per minute BP sitting:   130 / 90  (left arm) Cuff size:   large  Vitals Entered By: Vikki Ports (November 02, 2009 12:09 PM)  Physical Exam  General:  Pt is alert and oriented, in no acute distress. HEENT: normal Neck: normal carotid upstrokes without bruits, JVP normal Lungs: CTA CV: RRR without murmur or gallop Abd: soft, NT, positive BS, no bruit, no organomegaly Ext: no clubbing, cyanosis, or edema. peripheral pulses 2+ and equal Skin: warm and dry without rash    EKG  Procedure date:  11/02/2009  Findings:      NSR 65 bpm,  age-indeterminate inferior MI, age-indeterminate lateral infarct.   ICD Specifications Following MD:  Matthew Bunting, MD     ICD Vendor:  [facility] [employer]     ICD Model Number:  E102     ICD Serial Number:  [id number] ICD DOI:  09/08/2009      Lead 1:    Location: RA     DOI: 09/08/2009     Status: active Lead 2:    Location: RV     DOI: 09/08/2009     Model #: [id number]     Serial #: [id number]     Status: active  Indications::  VF   Brady Parameters Mode DDD     Lower Rate Limit:  50     Upper Rate Limit 120 PAV 260-400     Sensed AV Delay:  260-400  Tachy Zones VF:  210     VT:  180     Impression & Recommendations:  Problem # 1:  CAD (ICD-414.00) Pt with recent out of hospital MI,  followed by VF arrest in setting multivessel CAD, ultimately treated with CABG and ICD. He is doing remarkably well at present. Recommend add Lisinopril 5 mg daily for risk reduction in setting of MI and diabetes. Otherwise continue current medical program.  Will reassess LV function with a repeat echocardiogram in 2 months.  His updated medication list for this problem includes:    Aspirin 325 Mg Tabs (Aspirin) .Marland Kitchen... Take 1 tablet by mouth once a day    Metoprolol Tartrate 25 Mg Tabs (Metoprolol tartrate) .Marland Kitchen... Take  1/2  tablet by mouth twice a day    Lisinopril 5 Mg Tabs (Lisinopril) .Marland Kitchen... Take one tablet by mouth daily  Orders: EKG w/ Interpretation (93000) Echocardiogram (Echo)  Problem # 2:  HYPERLIPIDEMIA (ICD-272.4) Need to check lipids and LFT's. Statin was started during his recent hospitalization.  His updated medication list for this problem includes:    Simvastatin 10 Mg Tabs (Simvastatin) .Marland Kitchen... Take one tablet by mouth daily at bedtime  Patient Instructions: 1)  Your physician has recommended you make the following change in your medication:  START Lisinopril 5mg  once a day 2)  Your physician recommends that you schedule a follow-up appointment in: 2 MONTHS 3)  Your physician recommends that you return for a FASTING LIPID, LIVER and BMP in 2 WEEKS (412, 272.0)--Nothing to eat or drink after midnight, lab opens at 8:30  4)  Your physician has requested that you have an echocardiogram in 2 MONTHS.  Echocardiography is a painless test that uses sound waves to create images of your heart. It provides your doctor with information about the size and shape of your heart and how well your heart's chambers and valves are working.  This procedure takes approximately one hour. There are no restrictions for this procedure. Prescriptions: LISINOPRIL 5 MG TABS (LISINOPRIL) Take one tablet by mouth daily  #90 x 3   Entered by:   Julieta Gutting, RN, BSN   Authorized by:   Norva Karvonen,  MD   Signed by:   Julieta Gutting, RN, BSN on 11/02/2009   Method used:   Electronically to        [facility] (retail)       [address]       [place], [place]  [address]       Ph: [phone]       Fax: [phone]   RxID:  [id number]  

## 2010-04-05 NOTE — Assessment & Plan Note (Signed)
Summary: office visit   Vital Signs:  Patient profile:   45 year old male Height:      69 inches Weight:      248.25 pounds BMI:     36.79 O2 Sat:      98 % on Room air Pulse rate:   68 / minute Pulse rhythm:   regular BP sitting:   136 / 84  (left arm)  Vitals Entered By: Mauricia Area CMA (January 04, 2010 10:11 AM)  Nutrition Counseling: Patient's BMI is greater than 25 and therefore counseled on weight management options.  O2 Flow:  Room air CC: follow up   Primary Care Provider:  Syliva Overman MD  CC:  follow up.  History of Present Illness: Pt doing much better, he has had a few episodes of intermittent dizzy spells and does note exertional fatigue however.  Denies recent fever or chills. Denies sinus pressure, nasal congestion , ear pain or sore throat. Denies chest congestion, or cough productive of sputum. Denies , palpitations, PND, orthopnea or leg swelling. Denies abdominal pain, nausea, vomitting, diarrhea or constipation. Denies change in bowel movements or bloody stool. Denies dysuria , frequency, incontinence or hesitancy. Denies  joint pain, swelling, or reduced mobility. Denies headaches, vertigo, seizures. Denies depression, anxiety or insomnia. Denies  rash, lesions, or itch.     Current Medications (verified): 1)  Glipizide 10 Mg Tabs (Glipizide) .... Take 1 Tablet By Mouth Two Times A Day 2)  Aspirin 325 Mg Tabs (Aspirin) .... Take 1 Tablet By Mouth Once A Day 3)  Lantus 100 Unit/ml Soln (Insulin Glargine) .... 30 Units At Bedtime 4)  Metoprolol Tartrate 25 Mg Tabs (Metoprolol Tartrate) .... Take  1/2  Tablet By Mouth Twice A Day 5)  Simvastatin 10 Mg Tabs (Simvastatin) .... Take One Tablet By Mouth Daily At Bedtime 6)  L-Carnitine 250 Mg Caps (Levocarnitine) .... Take 1 Capsule By Mouth Once A Day 7)  Multivitamins  Tabs (Multiple Vitamin) .... Take 1 Tablet By Mouth Once A Day 8)  B Complex  Tabs (B Complex Vitamins) .... Take 1 Tablet By  Mouth Once A Day 9)  Lisinopril 10 Mg Tabs (Lisinopril) .... Take One Tablet By Mouth Daily 10)  Amiodarone Hcl 200 Mg Tabs (Amiodarone Hcl) .... Take One Half Tablet By Mouth Twice A Day 11)  Cinnamon .... 2 Tab Daily 12)  Chromium Piclonate .Marland Kitchen.. 1 Tab Daily  Allergies (verified): No Known Drug Allergies  Review of Systems      See HPI General:  Complains of fatigue. Eyes:  Complains of vision loss-both eyes; denies discharge and eye pain; repports that he notices improvement in his vision.thinks his blood sugars are better. Resp:  Complains of cough; 1 month ago pt had a bad cough with fever or chills, still hS A RPRODUCTIVE COUGH AT TIME THIS IS WHITE, no recent frever or chills. Endo:  Denies excessive thirst and excessive urination; bedtime sugars ar ea t times just a little over 200, oftenat times around 120, has had  hypoglycemic . Heme:  Denies abnormal bruising and bleeding. Allergy:  Denies hives or rash and itching eyes.  Physical Exam  General:  Well-developedobese,in no acute distress; alert,appropriate and cooperative throughout examination HEENT: No facial asymmetry,  EOMI, No sinus tenderness, TM's Clear, oropharynx  pink and moist.   Chest: Clear to auscultation bilaterally.  CVS: S1, S2, No murmurs, No S3.   Abd: Soft, Nontender.  MS: Adequate ROM spine, hips, shoulders and knees.  Ext: No edema.   CNS: CN 2-12 intact, power tone and sensation normal throughout.   Skin: Intact, no visible lesions or rashes.  Psych: Good eye contact, normal affect.  Memory intact, not anxious or depressed appearing.    Impression & Recommendations:  Problem # 1:  HYPERTENSION, BENIGN (ICD-401.1) Assessment Improved  His updated medication list for this problem includes:    Metoprolol Tartrate 25 Mg Tabs (Metoprolol tartrate) .Marland Kitchen... Take  1/2  tablet by mouth twice a day    Lisinopril 10 Mg Tabs (Lisinopril) .Marland Kitchen... Take one tablet by mouth daily  Orders: T-Basic Metabolic  Panel ([id number])  BP today: 136/84 Prior BP: 160/90 (12/28/2009)  Labs Reviewed: K+: 4.6 (11/25/2009) Creat: : 1.2 (11/25/2009)   Chol: 143 (11/25/2009)   HDL: 30.50 (11/25/2009)   LDL: 87 (11/25/2009)   TG: 126.0 (11/25/2009)  Problem # 2:  DIABETES MELLITUS, TYPE II, ON INSULIN (ICD-250.00) Assessment: Improved  His updated medication list for this problem includes:    Glipizide 10 Mg Tabs (Glipizide) .Marland Kitchen... Take 1 tablet by mouth two times a day    Aspirin 325 Mg Tabs (Aspirin) .Marland Kitchen... Take 1 tablet by mouth once a day    Lantus 100 Unit/ml Soln (Insulin glargine) .Marland KitchenMarland KitchenMarland KitchenMarland Kitchen 30 units at bedtime    Lisinopril 10 Mg Tabs (Lisinopril) .Marland Kitchen... Take one tablet by mouth daily  Orders: T- Hemoglobin A1C ([id number])  Problem # 3:  HYPERLIPIDEMIA (ICD-272.4) Assessment: Improved  The following medications were removed from the medication list:    Simvastatin 10 Mg Tabs (Simvastatin) .Marland Kitchen... Take one tablet by mouth daily at bedtime His updated medication list for this problem includes:    Crestor 10 Mg Tabs (Rosuvastatin calcium) .Marland Kitchen... Take 1 tab by mouth at bedtime  Labs Reviewed: SGOT: 17 (11/25/2009)   SGPT: 22 (11/25/2009)   HDL:30.50 (11/25/2009), 37 (05/08/2008)  LDL:87 (11/25/2009), 155 (29/56/2130)  Chol:143 (11/25/2009), 250 (05/08/2008)  Trig:126.0 (11/25/2009), 288 (05/08/2008)  Problem # 4:  NICOTINE ADDICTION (ICD-305.1) Assessment: Unchanged  Complete Medication List: 1)  Glipizide 10 Mg Tabs (Glipizide) .... Take 1 tablet by mouth two times a day 2)  Aspirin 325 Mg Tabs (Aspirin) .... Take 1 tablet by mouth once a day 3)  Lantus 100 Unit/ml Soln (Insulin glargine) .... 30 units at bedtime 4)  Metoprolol Tartrate 25 Mg Tabs (Metoprolol tartrate) .... Take  1/2  tablet by mouth twice a day 5)  L-carnitine 250 Mg Caps (Levocarnitine) .... Take 1 capsule by mouth once a day 6)  Multivitamins Tabs (Multiple vitamin) .... Take 1 tablet by mouth once a day 7)  B Complex Tabs (B  complex vitamins) .... Take 1 tablet by mouth once a day 8)  Lisinopril 10 Mg Tabs (Lisinopril) .... Take one tablet by mouth daily 9)  Amiodarone Hcl 200 Mg Tabs (Amiodarone hcl) .... Take one half tablet by mouth twice a day 10)  Cinnamon  .... 2 tab daily 11)  Chromium Piclonate  .Marland Kitchen.. 1 tab daily 12)  Crestor 10 Mg Tabs (Rosuvastatin calcium) .... Take 1 tab by mouth at bedtime  Other Orders: T-Hepatic Function [phone]) T-Lipid Profile [phone])  Patient Instructions: 1)  Please schedule a follow-up appointment in 4 months. 2)  BMP prior to visit, ICD-9: 3)  Hepatic Panel prior to visit, ICD-9: 4)  Lipid Panel prior to visit, ICD-9:  fasting in december  5)  HbgA1C prior to visit, ICD-9: 6)  pls stop the chewing tobacco. 7)  pls change from simvastatin to crestor which is  a stronger and more effective, check at the health dept Prescriptions: CRESTOR 10 MG TABS (ROSUVASTATIN CALCIUM) Take 1 tab by mouth at bedtime  #90 x 1   Entered and Authorized by:   Syliva Overman MD   Signed by:   Syliva Overman MD on 01/04/2010   Method used:   Print then Give to Patient   RxID:   [id number]    Orders Added: 1)  Est. Patient Level III [83419] 2)  T-Basic Metabolic Panel [62229-79892] 3)  T-Hepatic Function [80076-22960] 4)  T-Lipid Profile [80061-22930] 5)  T- Hemoglobin A1C [83036-23375]    crestor 10mg  two boxes given lot [id number] exp 03/14  Appended Document: office visit Crestor 2 boxes lot [id number] exp 02/14  Lantus 7 boxes lot [id number] exp 01/14

## 2010-04-05 NOTE — Assessment & Plan Note (Signed)
Summary: 2 month rov   Visit Type:  2 months follow up Primary Provider:  Syliva Overman MD  CC:  No complaints.  History of Present Illness: 45 year-old male presents for follow-up evaluation after sustaining an out-of-hospital MI. He presented with post-MI CHF and ultimately underwent cardiac cath demonstrating severe 3 vessel CAD. While he was awaiting CABG (for plavix washout), he had a VF arrest leading to multiorgan dysfunction/mechanical ventilation, etc. He was treated aggressively and ultimately was taken for CABG electively following his recovery from the VF arrest. He then underwent ICD placement after CABG for secondary prevention of VT/VF.   He had a URI a few weeks ago, but otherwise is doing well. Denies chest pain, dyspnea, or edema. Denies orthopnea or PND. No palpitations or syncope.  Current Medications (verified): 1)  Glipizide 10 Mg Tabs (Glipizide) .... Take 1 Tablet By Mouth Two Times A Day 2)  Aspirin 325 Mg Tabs (Aspirin) .... Take 1 Tablet By Mouth Once A Day 3)  Lantus 100 Unit/ml Soln (Insulin Glargine) .... 30 Units At Bedtime 4)  Metoprolol Tartrate 25 Mg Tabs (Metoprolol Tartrate) .... Take  1/2  Tablet By Mouth Twice A Day 5)  Simvastatin 10 Mg Tabs (Simvastatin) .... Take One Tablet By Mouth Daily At Bedtime 6)  L-Carnitine 250 Mg Caps (Levocarnitine) .... Take 1 Capsule By Mouth Once A Day 7)  Multivitamins  Tabs (Multiple Vitamin) .... Take 1 Tablet By Mouth Once A Day 8)  B Complex  Tabs (B Complex Vitamins) .... Take 1 Tablet By Mouth Once A Day 9)  Lisinopril 5 Mg Tabs (Lisinopril) .... Take One Tablet By Mouth Daily 10)  Amiodarone Hcl 200 Mg Tabs (Amiodarone Hcl) .... Take One Tablet By Mouth Twice A Day  Allergies (verified): No Known Drug Allergies  Past History:  Past medical history reviewed for relevance to current acute and chronic problems.  Past Medical History: Reviewed history from 10/28/2009 and no changes required.  1.  Ventricular fibrillation at rest, initial event August 26, 2009.   2. Acute respiratory failure in setting of cardiac arrest and       pulmonary edema.   3. Encephalopathy, postanoxic agitation.   4. Acute on chronic renal insufficiency postoperatively.   5. Acute on chronic blood loss anemia postoperatively.   6. Volume overload postoperatively.   7. Sleep apnea.   8. History of diabetic ketoacidosis occurring in 1994 where he spent       30 days in a coma.  He developed infection of his leg and       developing pancreatitis during that time.      Review of Systems       Negative except as per HPI   Vital Signs:  Patient profile:   45 year old male Height:      69 inches Weight:      245.50 pounds BMI:     36.39 Pulse rate:   58 / minute Resp:     18 per minute BP sitting:   160 / 90  (left arm) Cuff size:   large  Vitals Entered By: Vikki Ports (December 28, 2009 10:58 AM)  Physical Exam  General:  Pt is alert and oriented, obese male, in no acute distress. HEENT: normal Neck: normal carotid upstrokes without bruits, JVP normal Lungs: CTA CV: RRR without murmur or gallop Abd: soft, NT, positive BS, no bruit, no organomegaly Ext: no clubbing, cyanosis, or edema. peripheral pulses 2+ and equal Skin: warm  and dry without rash    EKG  Procedure date:  12/28/2009  Findings:      Sinus brady 58 bpm, left axis age-indeterminate inferior MI.   ICD Specifications Following MD:  Lewayne Bunting, MD     ICD Vendor:  [facility] [employer]     ICD Model Number:  E102     ICD Serial Number:  [id number] ICD DOI:  09/08/2009      Lead 1:    Location: RA     DOI: 09/08/2009     Status: active Lead 2:    Location: RV     DOI: 09/08/2009     Model #: [id number]     Serial #: [id number]     Status: active  Indications::  VF   Brady Parameters Mode DDD     Lower Rate Limit:  50     Upper Rate Limit 120 PAV 260-400     Sensed AV Delay:  260-400  Tachy Zones VF:  210     VT:  180       Impression & Recommendations:  Problem # 1:  CAD (ICD-414.00)  Stable without angina. Continue ASA, metoprolol, and increase lisinopril to 10 mg daily.  His updated medication list for this problem includes:    Aspirin 325 Mg Tabs (Aspirin) .Marland Kitchen... Take 1 tablet by mouth once a day    Metoprolol Tartrate 25 Mg Tabs (Metoprolol tartrate) .Marland Kitchen... Take  1/2  tablet by mouth twice a day    Lisinopril 5 Mg Tabs (Lisinopril) .Marland Kitchen... Take one tablet by mouth daily  Orders: EKG w/ Interpretation (93000)  Problem # 2:  HYPERLIPIDEMIA (ICD-272.4) Advised crestor at last visit but he cannot afford this. Continue simvastatin at low dose as he is on Amiodarone.  His updated medication list for this problem includes:    Simvastatin 10 Mg Tabs (Simvastatin) .Marland Kitchen... Take one tablet by mouth daily at bedtime  CHOL: 143 (11/25/2009)   LDL: 87 (11/25/2009)   HDL: 30.50 (11/25/2009)   TG: 126.0 (11/25/2009)  Problem # 3:  HYPERTENSION, BENIGN (ICD-401.1)  Repeat BP on my check 160/90. He reports white-coat HTN. Increase lisinopril and asked pt to obtain home BP cuff.   His updated medication list for this problem includes:    Aspirin 325 Mg Tabs (Aspirin) .Marland Kitchen... Take 1 tablet by mouth once a day    Metoprolol Tartrate 25 Mg Tabs (Metoprolol tartrate) .Marland Kitchen... Take  1/2  tablet by mouth twice a day    Lisinopril 10 Mg Tabs (Lisinopril) .Marland Kitchen... Take one tablet by mouth daily  BP today: 160/90 Prior BP: 130/90 (11/02/2009)  Labs Reviewed: K+: 4.6 (11/25/2009) Creat: : 1.2 (11/25/2009)   Chol: 143 (11/25/2009)   HDL: 30.50 (11/25/2009)   LDL: 87 (11/25/2009)   TG: 126.0 (11/25/2009)  Orders: EKG w/ Interpretation (93000)  Patient Instructions: 1)  Your physician recommends that you return for a FASTING Lipid, Liver and TSH in DECEMBER---Nothing to eat or drink after midnight (412, 401.9, 272.0) (ORDER GIVEN TO PT) 2)  Your physician has recommended you make the following change in your medication: INCREASE  Lisinopril to 10mg  once a day 3)  Your physician has requested that you regularly monitor and record your blood pressure readings at home.  Please use the same machine at the same time of day to check your readings and record them to bring to your follow-up visit. 4)  Your physician recommends that you schedule a follow-up appointment in: 4 MONTHS Prescriptions: LISINOPRIL 10 MG  TABS (LISINOPRIL) Take one tablet by mouth daily  #90 x 3   Entered by:   Julieta Gutting, RN, BSN   Authorized by:   Norva Karvonen, MD   Signed by:   Julieta Gutting, RN, BSN on 12/28/2009   Method used:   Electronically to        [facility] (retail)       [address]       [place], [place]  [address]       Ph: [phone]       Fax: [phone]   RxID:   [id number]

## 2010-04-05 NOTE — Letter (Signed)
Summary: [facility] office notes  [facility] office notes   Imported By: Curtis Sites 10/05/2009 11:41:52  _____________________________________________________________________  External Attachment:    Type:   Image     Comment:   External Document

## 2010-04-05 NOTE — Progress Notes (Signed)
  Phone Note Call from Patient   Caller: Patient Summary of Call: patient often calls for lantus samples, are we supposed to be getting samples for him? i did not think we were accepting lantus samples anymore Initial call taken by: Adella Hare LPN,  October 11, 2009 11:13 AM  Follow-up for Phone Call        I was waiting to see if [facility] came here if they did I was going to get some for him...bUT he also needso be giovenm th form fro m the manufacturer, he may qualify for free meds Follow-up by: Syliva Overman MD,  October 11, 2009 12:26 PM  Additional Follow-up for Phone Call Additional follow up Details #1::        we are not anticipating any samples, what do i need to tell this patient? Additional Follow-up by: Adella Hare LPN,  October 12, 2009 9:49 AM    Additional Follow-up for Phone Call Additional follow up Details #2::    exactl;y that and see if the company has application that he can fill out himself Follow-up by: Syliva Overman MD,  October 12, 2009 4:42 PM  Additional Follow-up for Phone Call Additional follow up Details #3:: Details for Additional Follow-up Action Taken: Mailed him an assistance application Additional Follow-up by: Everitt Amber LPN,  October 14, 2009 3:51 PM

## 2010-04-05 NOTE — Letter (Signed)
Summary: ASSISTANCE PROGRAM  ASSISTANCE PROGRAM   Imported By: Lind Guest 11/18/2009 09:07:07  _____________________________________________________________________  External Attachment:    Type:   Image     Comment:   External Document

## 2010-04-05 NOTE — Letter (Signed)
Summary: MISC  MISC   Imported By: Lind Guest 09/27/2009 15:48:25  _____________________________________________________________________  External Attachment:    Type:   Image     Comment:   External Document

## 2010-04-05 NOTE — Progress Notes (Signed)
  DDS request recieved sent to [facility]. Cala Bradford Mesiemore  September 27, 2009 10:04 AM

## 2010-04-05 NOTE — Miscellaneous (Signed)
Summary: Device preload  Clinical Lists Changes  Observations: Added new observation of ICD INDICATN: VF (09/23/2009 8:12) Added new observation of ICDLEADSTAT2: active (09/23/2009 8:12) Added new observation of ICDLEADSER2: 332951  (09/23/2009 8:12) Added new observation of ICDLEADMOD2: 0184  (09/23/2009 8:12) Added new observation of ICDLEADLOC2: RV  (09/23/2009 8:12) Added new observation of ICDLEADSTAT1: active  (09/23/2009 8:12) Added new observation of ICDLEADLOC1: RA  (09/23/2009 8:12) Added new observation of ICDLEADDOI2: 09/08/2009  (09/23/2009 8:12) Added new observation of ICDLEADDOI1: 09/08/2009  (09/23/2009 8:12) Added new observation of ICD IMPL DTE: 09/08/2009  (09/23/2009 8:12) Added new observation of ICD SERL#: [id number]  (09/23/2009 8:12) Added new observation of ICD MODL#: E102  (09/23/2009 8:12) Added new observation of ICDMANUFACTR: [employer]  (09/23/2009 8:12) Added new observation of ICD MD: Lewayne Bunting, MD  (09/23/2009 8:12)       ICD Specifications Following MD:  Lewayne Bunting, MD     ICD Vendor:  [facility] [employer]     ICD Model Number:  E102     ICD Serial Number:  [id number] ICD DOI:  09/08/2009      Lead 1:    Location: RA     DOI: 09/08/2009     Status: active Lead 2:    Location: RV     DOI: 09/08/2009     Model #: [id number]     Serial #: [id number]     Status: active  Indications::  VF

## 2010-04-05 NOTE — Progress Notes (Signed)
  Phone Note Other Incoming   Caller: DR SIMPSON Summary of Call: PLEASE CALL PT AND VERIFY THAT HE RECEIVED THE SAMPLES GIVEN TO HIS PARENTS ON 01/13/2010, AND DOCUMENT QUANTITY AND LOT NUMBERS, FOR CRESTOR AND LANTUS Initial call taken by: Syliva Overman MD,  January 14, 2010 5:40 AM  Follow-up for Phone Call        Patient recieved samples, and I have documented Crestor and Lantus lot numbers,expiration dates and amounts given. Follow-up by: Mauricia Area CMA,  January 14, 2010 8:57 AM

## 2010-04-07 NOTE — Cardiovascular Report (Signed)
Summary: Office Visit   Office Visit   Imported By: Roderic Ovens 03/29/2010 13:44:08  _____________________________________________________________________  External Attachment:    Type:   Image     Comment:   External Document

## 2010-04-07 NOTE — Assessment & Plan Note (Signed)
Summary: F3M   Visit Type:  Follow-up Primary Provider:  Syliva Overman MD  CC:  occasional dizziness balance is off.  History of Present Illness: Matthew [name] returns today for followup.  He is a pleasant 45 yo man with premature CAD, s/p MI, h/o VF arrest outside the setting of his MI, as well as sinus bradycardia.  He is s/p DDD ICD.  Since his ICD implant several months ago he has done well.  He has started back exercising and had no problems.  He admits to dietary indiscretion and has gained some weight over the holidays. No ICD shocks or syncope.  Current Medications (verified): 1)  Glipizide 10 Mg Tabs (Glipizide) .... Take 1 Tablet By Mouth Two Times A Day 2)  Aspirin 325 Mg Tabs (Aspirin) .... Take 1 Tablet By Mouth Once A Day 3)  Lantus 100 Unit/ml Soln (Insulin Glargine) .... 30 Units At Bedtime 4)  Metoprolol Tartrate 25 Mg Tabs (Metoprolol Tartrate) .... Take  1/2  Tablet By Mouth Twice A Day 5)  L-Carnitine 250 Mg Caps (Levocarnitine) .... Take 1 Capsule By Mouth Once A Day 6)  Multivitamins  Tabs (Multiple Vitamin) .... Take 1 Tablet By Mouth Once A Day 7)  B Complex  Tabs (B Complex Vitamins) .... Take 1 Tablet By Mouth Once A Day 8)  Lisinopril 10 Mg Tabs (Lisinopril) .... Take One Tablet By Mouth Daily 9)  Amiodarone Hcl 200 Mg Tabs (Amiodarone Hcl) .... Take One Half Tablet By Mouth Once Daily 10)  Cinnamon .... 2 Tab Daily 11)  Chromium Piclonate .Marland Kitchen.. 1 Tab Daily 12)  Crestor 10 Mg Tabs (Rosuvastatin Calcium) .... Take 1 Tab By Mouth At Bedtime  Allergies (verified): No Known Drug Allergies  Past History:  Past Medical History: Last updated: 10/28/2009  1. Ventricular fibrillation at rest, initial event August 26, 2009.   2. Acute respiratory failure in setting of cardiac arrest and       pulmonary edema.   3. Encephalopathy, postanoxic agitation.   4. Acute on chronic renal insufficiency postoperatively.   5. Acute on chronic blood loss anemia postoperatively.     6. Volume overload postoperatively.   7. Sleep apnea.   8. History of diabetic ketoacidosis occurring in 1994 where he spent       30 days in a coma.  He developed infection of his leg and       developing pancreatitis during that time.      Past Surgical History: Last updated: 10/28/2009  Coronary artery  bypass grafting x4 on September 01, 2009.    Tonsillectomy (1998)  Appendectomy   mid 1990's  Status post tonsillectomy.   right sided abdominal cyst removal.   Review of Systems  The patient denies chest pain, syncope, dyspnea on exertion, and peripheral edema.    Vital Signs:  Patient profile:   45 year old male Weight:      252 pounds BMI:     37.35 O2 Sat:      99 % on Room air Pulse rate:   69 / minute BP sitting:   172 / 94  (right arm)  Vitals Entered By: Dreama Saa, CNA (March 10, 2010 9:41 AM)  O2 Flow:  Room air  Physical Exam  General:  Well-developed obese,in no acute distress; alert,appropriate and cooperative throughout examination HEENT: No facial asymmetry,  EOMI, No sinus tenderness, TM's Clear, oropharynx  pink and moist. Well healed ICD incision.  Chest: Clear to auscultation bilaterally.  CVS: S1, S2, No murmurs, No S3.   Abd: Soft, Nontender.  MS: Adequate ROM spine, hips, shoulders and knees.  Ext: No edema.   CNS: CN 2-12 intact, power tone and sensation normal throughout.   Skin: Intact, no visible lesions or rashes.  Psych: Good eye contact, normal affect.  Memory intact, not anxious or depressed appearing.     ICD Specifications Following MD:  Matthew Bunting, MD     ICD Vendor:  [facility] [employer]     ICD Model Number:  E102     ICD Serial Number:  [id number] ICD DOI:  09/08/2009      Lead 1:    Location: RA     DOI: 09/08/2009     Status: active Lead 2:    Location: RV     DOI: 09/08/2009     Model #: 1191     Serial #: [id number]     Status: active  Indications::  VF   ICD Follow Up Remote Check?  No Charge Time:  8.4 seconds      Battery Est. Longevity:  10.5 years Underlying rhythm:  SR ICD Dependent:  No       ICD Device Measurements Atrium:  Amplitude: 4.5 mV, Impedance: 505 ohms, Threshold: 0.5 V at 0.4 msec Right Ventricle:  Amplitude: 17.7 mV, Impedance: 512 ohms, Threshold: 0.9 V at 0.4 msec Shock Impedance: 54 ohms   Episodes MS Episodes:  0     Percent Mode Switch:  0     Coumadin:  No Shock:  0     ATP:  0     Nonsustained:  0     Atrial Pacing:  <1%     Ventricular Pacing:  <1%  Brady Parameters Mode DDD     Lower Rate Limit:  50     Upper Rate Limit 120 PAV 260-400     Sensed AV Delay:  260-400  Tachy Zones VF:  210     VT:  180     Next Remote Date:  06/09/2010     Next Cardiology Appt Due:  03/14/2011 Tech Comments:  Outputs reprogrammed for chronic thresholds.  Device function normal.  Latitude transmissions every 3 months.  ROV 1 year with Dr. Ladona Ridgel in [place]. Altha Harm, LPN  March 10, 2010 10:32 AM  MD Comments:  Agree with above.  Impression & Recommendations:  Problem # 1:  VENTRICULAR TACHYCARDIA (ICD-427.1) He has had no recurrent symptoms and I have asked him to reduce his amiodarone to 100 mg daily. His updated medication list for this problem includes:    Aspirin 325 Mg Tabs (Aspirin) .Marland Kitchen... Take 1 tablet by mouth once a day    Metoprolol Tartrate 25 Mg Tabs (Metoprolol tartrate) .Marland Kitchen... Take  1/2  tablet by mouth twice a day    Lisinopril 10 Mg Tabs (Lisinopril) .Marland Kitchen... Take one tablet by mouth daily    Amiodarone Hcl 200 Mg Tabs (Amiodarone hcl) .Marland Kitchen... Take one half tablet by mouth once daily  Problem # 2:  AUTOMATIC IMPLANTABLE CARDIAC DEFIBRILLATOR SITU (ICD-V45.02) His device is working normally. Will recheck in several months.  Problem # 3:  CAD (ICD-414.00) He denies anginal symptoms. Continue meds as below. His updated medication list for this problem includes:    Aspirin 325 Mg Tabs (Aspirin) .Marland Kitchen... Take 1 tablet by mouth once a day    Metoprolol Tartrate 25 Mg Tabs  (Metoprolol tartrate) .Marland Kitchen... Take  1/2  tablet by mouth twice a day  Lisinopril 10 Mg Tabs (Lisinopril) .Marland Kitchen... Take one tablet by mouth daily  Patient Instructions: 1)  Your physician recommends that you schedule a follow-up appointment in: 1 year 2)  Your physician has recommended you make the following change in your medication: Decrease Amiodarone to 200mg  by mouth once daily  Prescriptions: AMIODARONE HCL 200 MG TABS (AMIODARONE HCL) Take one half tablet by mouth once daily  #30 x 11   Entered by:   Larita Fife Via LPN   Authorized by:   Laren Boom, MD, [facility]   Signed by:   Larita Fife Via LPN on 19/14/7829   Method used:   Electronically to        [facility] (retail)       [address]       [place], [place]  [address]       Ph: [phone]       Fax: [phone]   RxID:   [id number]

## 2010-04-15 ENCOUNTER — Ambulatory Visit: Payer: Self-pay | Admitting: Cardiovascular Disease

## 2010-04-26 ENCOUNTER — Encounter: Payer: Self-pay | Admitting: Family Medicine

## 2010-04-28 ENCOUNTER — Ambulatory Visit: Payer: Self-pay | Admitting: Cardiovascular Disease

## 2010-05-03 NOTE — Letter (Signed)
Summary: rx assistance  rx assistance   Imported By: Lind Guest 04/26/2010 16:37:51  _____________________________________________________________________  External Attachment:    Type:   Image     Comment:   External Document

## 2010-05-05 ENCOUNTER — Ambulatory Visit: Payer: Self-pay | Admitting: Family Medicine

## 2010-05-22 LAB — CBC
HCT: 21.5 % — ABNORMAL LOW (ref 39.0–52.0)
HCT: 22.7 % — ABNORMAL LOW (ref 39.0–52.0)
HCT: 23.8 % — ABNORMAL LOW (ref 39.0–52.0)
HCT: 24.5 % — ABNORMAL LOW (ref 39.0–52.0)
HCT: 25.3 % — ABNORMAL LOW (ref 39.0–52.0)
HCT: 25.4 % — ABNORMAL LOW (ref 39.0–52.0)
HCT: 22.4 % — ABNORMAL LOW (ref 39.0–52.0)
HCT: 25.1 % — ABNORMAL LOW (ref 39.0–52.0)
HCT: 26 % — ABNORMAL LOW (ref 39.0–52.0)
HCT: 29.9 % — ABNORMAL LOW (ref 39.0–52.0)
HCT: 35.8 % — ABNORMAL LOW (ref 39.0–52.0)
HCT: 38.9 % — ABNORMAL LOW (ref 39.0–52.0)
HCT: 39.3 % (ref 39.0–52.0)
HCT: 40.5 % (ref 39.0–52.0)
HCT: 43.8 % (ref 39.0–52.0)
Hemoglobin: 7.4 g/dL — ABNORMAL LOW (ref 13.0–17.0)
Hemoglobin: 7.7 g/dL — ABNORMAL LOW (ref 13.0–17.0)
Hemoglobin: 8.1 g/dL — ABNORMAL LOW (ref 13.0–17.0)
Hemoglobin: 8.3 g/dL — ABNORMAL LOW (ref 13.0–17.0)
Hemoglobin: 8.5 g/dL — ABNORMAL LOW (ref 13.0–17.0)
Hemoglobin: 8.6 g/dL — ABNORMAL LOW (ref 13.0–17.0)
Hemoglobin: 10.1 g/dL — ABNORMAL LOW (ref 13.0–17.0)
Hemoglobin: 12.1 g/dL — ABNORMAL LOW (ref 13.0–17.0)
Hemoglobin: 14.7 g/dL (ref 13.0–17.0)
Hemoglobin: 7.7 g/dL — ABNORMAL LOW (ref 13.0–17.0)
Hemoglobin: 8.7 g/dL — ABNORMAL LOW (ref 13.0–17.0)
Hemoglobin: 9.1 g/dL — ABNORMAL LOW (ref 13.0–17.0)
MCH: 29 pg (ref 26.0–34.0)
MCH: 29.1 pg (ref 26.0–34.0)
MCH: 29.2 pg (ref 26.0–34.0)
MCH: 29.3 pg (ref 26.0–34.0)
MCH: 29.3 pg (ref 26.0–34.0)
MCH: 29.4 pg (ref 26.0–34.0)
MCH: 28.6 pg (ref 26.0–34.0)
MCH: 28.8 pg (ref 26.0–34.0)
MCH: 29 pg (ref 26.0–34.0)
MCH: 29.1 pg (ref 26.0–34.0)
MCH: 29.1 pg (ref 26.0–34.0)
MCH: 29.1 pg (ref 26.0–34.0)
MCH: 29.4 pg (ref 26.0–34.0)
MCH: 29.5 pg (ref 26.0–34.0)
MCHC: 33.5 g/dL (ref 30.0–36.0)
MCHC: 33.9 g/dL (ref 30.0–36.0)
MCHC: 33.9 g/dL (ref 30.0–36.0)
MCHC: 33.9 g/dL (ref 30.0–36.0)
MCHC: 34.1 g/dL (ref 30.0–36.0)
MCHC: 34.3 g/dL (ref 30.0–36.0)
MCHC: 33.5 g/dL (ref 30.0–36.0)
MCHC: 33.7 g/dL (ref 30.0–36.0)
MCHC: 34.2 g/dL (ref 30.0–36.0)
MCHC: 34.6 g/dL (ref 30.0–36.0)
MCHC: 34.7 g/dL (ref 30.0–36.0)
MCHC: 34.7 g/dL (ref 30.0–36.0)
MCHC: 34.8 g/dL (ref 30.0–36.0)
MCV: 85.3 fL (ref 78.0–100.0)
MCV: 85.7 fL (ref 78.0–100.0)
MCV: 85.7 fL (ref 78.0–100.0)
MCV: 86.2 fL (ref 78.0–100.0)
MCV: 86.6 fL (ref 78.0–100.0)
MCV: 86.9 fL (ref 78.0–100.0)
MCV: 83.6 fL (ref 78.0–100.0)
MCV: 84.1 fL (ref 78.0–100.0)
MCV: 84.7 fL (ref 78.0–100.0)
MCV: 85.3 fL (ref 78.0–100.0)
MCV: 85.4 fL (ref 78.0–100.0)
MCV: 85.8 fL (ref 78.0–100.0)
Platelets: 136 10*3/uL — ABNORMAL LOW (ref 150–400)
Platelets: 149 10*3/uL — ABNORMAL LOW (ref 150–400)
Platelets: 150 10*3/uL (ref 150–400)
Platelets: 186 10*3/uL (ref 150–400)
Platelets: 248 10*3/uL (ref 150–400)
Platelets: 334 10*3/uL (ref 150–400)
Platelets: 137 10*3/uL — ABNORMAL LOW (ref 150–400)
Platelets: 150 10*3/uL (ref 150–400)
Platelets: 158 10*3/uL (ref 150–400)
Platelets: 201 10*3/uL (ref 150–400)
Platelets: 244 10*3/uL (ref 150–400)
Platelets: 246 10*3/uL (ref 150–400)
Platelets: 273 10*3/uL (ref 150–400)
RBC: 2.51 MIL/uL — ABNORMAL LOW (ref 4.22–5.81)
RBC: 2.65 MIL/uL — ABNORMAL LOW (ref 4.22–5.81)
RBC: 2.77 MIL/uL — ABNORMAL LOW (ref 4.22–5.81)
RBC: 2.83 MIL/uL — ABNORMAL LOW (ref 4.22–5.81)
RBC: 2.92 MIL/uL — ABNORMAL LOW (ref 4.22–5.81)
RBC: 2.95 MIL/uL — ABNORMAL LOW (ref 4.22–5.81)
RBC: 2.64 MIL/uL — ABNORMAL LOW (ref 4.22–5.81)
RBC: 2.99 MIL/uL — ABNORMAL LOW (ref 4.22–5.81)
RBC: 3.11 MIL/uL — ABNORMAL LOW (ref 4.22–5.81)
RBC: 3.43 MIL/uL — ABNORMAL LOW (ref 4.22–5.81)
RBC: 3.96 MIL/uL — ABNORMAL LOW (ref 4.22–5.81)
RBC: 4.11 MIL/uL — ABNORMAL LOW (ref 4.22–5.81)
RBC: 4.18 MIL/uL — ABNORMAL LOW (ref 4.22–5.81)
RBC: 4.55 MIL/uL (ref 4.22–5.81)
RBC: 5.13 MIL/uL (ref 4.22–5.81)
RDW: 13.5 % (ref 11.5–15.5)
RDW: 14.3 % (ref 11.5–15.5)
RDW: 14.4 % (ref 11.5–15.5)
RDW: 14.5 % (ref 11.5–15.5)
RDW: 14.7 % (ref 11.5–15.5)
RDW: 15 % (ref 11.5–15.5)
RDW: 12.9 % (ref 11.5–15.5)
RDW: 13 % (ref 11.5–15.5)
RDW: 13.1 % (ref 11.5–15.5)
RDW: 13.1 % (ref 11.5–15.5)
RDW: 13.2 % (ref 11.5–15.5)
RDW: 13.2 % (ref 11.5–15.5)
RDW: 13.2 % (ref 11.5–15.5)
WBC: 12.2 10*3/uL — ABNORMAL HIGH (ref 4.0–10.5)
WBC: 14.2 10*3/uL — ABNORMAL HIGH (ref 4.0–10.5)
WBC: 16.9 10*3/uL — ABNORMAL HIGH (ref 4.0–10.5)
WBC: 17.6 10*3/uL — ABNORMAL HIGH (ref 4.0–10.5)
WBC: 17.9 10*3/uL — ABNORMAL HIGH (ref 4.0–10.5)
WBC: 18.1 10*3/uL — ABNORMAL HIGH (ref 4.0–10.5)
WBC: 11.6 10*3/uL — ABNORMAL HIGH (ref 4.0–10.5)
WBC: 12.6 10*3/uL — ABNORMAL HIGH (ref 4.0–10.5)
WBC: 14.2 10*3/uL — ABNORMAL HIGH (ref 4.0–10.5)
WBC: 14.3 10*3/uL — ABNORMAL HIGH (ref 4.0–10.5)
WBC: 16 10*3/uL — ABNORMAL HIGH (ref 4.0–10.5)
WBC: 16.6 10*3/uL — ABNORMAL HIGH (ref 4.0–10.5)
WBC: 17.2 10*3/uL — ABNORMAL HIGH (ref 4.0–10.5)
WBC: 8.4 10*3/uL (ref 4.0–10.5)
WBC: 9 10*3/uL (ref 4.0–10.5)

## 2010-05-22 LAB — URINALYSIS, ROUTINE W REFLEX MICROSCOPIC
Bilirubin Urine: NEGATIVE
Glucose, UA: 100 mg/dL — AB
Ketones, ur: NEGATIVE mg/dL
Nitrite: NEGATIVE
Protein, ur: NEGATIVE mg/dL
Specific Gravity, Urine: 1.008 (ref 1.005–1.030)
Urobilinogen, UA: 1 mg/dL (ref 0.0–1.0)
pH: 7.5 (ref 5.0–8.0)

## 2010-05-22 LAB — BLOOD GAS, ARTERIAL
Acid-Base Excess: 0.9 mmol/L (ref 0.0–2.0)
Acid-Base Excess: 1 mmol/L (ref 0.0–2.0)
Acid-base deficit: 1.1 mmol/L (ref 0.0–2.0)
Acid-base deficit: 1.3 mmol/L (ref 0.0–2.0)
Acid-base deficit: 4.7 mmol/L — ABNORMAL HIGH (ref 0.0–2.0)
Bicarbonate: 17.5 mEq/L — ABNORMAL LOW (ref 20.0–24.0)
Bicarbonate: 21 mEq/L (ref 20.0–24.0)
Bicarbonate: 22.4 mEq/L (ref 20.0–24.0)
Bicarbonate: 24.2 mEq/L — ABNORMAL HIGH (ref 20.0–24.0)
Bicarbonate: 25.1 mEq/L — ABNORMAL HIGH (ref 20.0–24.0)
Bicarbonate: 28.3 mEq/L — ABNORMAL HIGH (ref 20.0–24.0)
Drawn by: 280971
Drawn by: 280971
Drawn by: 280971
FIO2: 0.21 %
FIO2: 0.5 %
FIO2: 0.5 %
FIO2: 1 %
MECHVT: 600 mL
MECHVT: 700 mL
O2 Saturation: 100 %
O2 Saturation: 98.7 %
O2 Saturation: 99.5 %
O2 Saturation: 99.5 %
O2 Saturation: 99.6 %
PEEP: 0.5 cmH2O
PEEP: 0.5 cmH2O
PEEP: 0.5 cmH2O
PEEP: 5 cmH2O
Patient temperature: 89
Patient temperature: 91.4
Patient temperature: 98.6
RATE: 12 resp/min
RATE: 14 resp/min
RATE: 20 resp/min
TCO2: 18.2 mmol/L (ref 0–100)
TCO2: 22.2 mmol/L (ref 0–100)
TCO2: 25.3 mmol/L (ref 0–100)
TCO2: 26.4 mmol/L (ref 0–100)
pCO2 arterial: 20.6 mmHg — ABNORMAL LOW (ref 35.0–45.0)
pCO2 arterial: 26.2 mmHg — ABNORMAL LOW (ref 35.0–45.0)
pCO2 arterial: 30.9 mmHg — ABNORMAL LOW (ref 35.0–45.0)
pCO2 arterial: 39.2 mmHg (ref 35.0–45.0)
pCO2 arterial: 44.3 mmHg (ref 35.0–45.0)
pH, Arterial: 7.347 — ABNORMAL LOW (ref 7.350–7.450)
pH, Arterial: 7.421 (ref 7.350–7.450)
pH, Arterial: 7.465 — ABNORMAL HIGH (ref 7.350–7.450)
pH, Arterial: 7.549 — ABNORMAL HIGH (ref 7.350–7.450)
pH, Arterial: 7.616 (ref 7.350–7.450)
pO2, Arterial: 109 mmHg — ABNORMAL HIGH (ref 80.0–100.0)
pO2, Arterial: 148 mmHg — ABNORMAL HIGH (ref 80.0–100.0)
pO2, Arterial: 185 mmHg — ABNORMAL HIGH (ref 80.0–100.0)
pO2, Arterial: 199 mmHg — ABNORMAL HIGH (ref 80.0–100.0)
pO2, Arterial: 239 mmHg — ABNORMAL HIGH (ref 80.0–100.0)
pO2, Arterial: 253 mmHg — ABNORMAL HIGH (ref 80.0–100.0)

## 2010-05-22 LAB — COMPREHENSIVE METABOLIC PANEL
ALT: 56 U/L — ABNORMAL HIGH (ref 0–53)
AST: 29 U/L (ref 0–37)
Albumin: 2.2 g/dL — ABNORMAL LOW (ref 3.5–5.2)
Albumin: 3.2 g/dL — ABNORMAL LOW (ref 3.5–5.2)
Alkaline Phosphatase: 361 U/L — ABNORMAL HIGH (ref 39–117)
Alkaline Phosphatase: 103 U/L (ref 39–117)
Alkaline Phosphatase: 84 U/L (ref 39–117)
BUN: 29 mg/dL — ABNORMAL HIGH (ref 6–23)
BUN: 13 mg/dL (ref 6–23)
BUN: 15 mg/dL (ref 6–23)
BUN: 20 mg/dL (ref 6–23)
CO2: 27 mEq/L (ref 19–32)
CO2: 17 mEq/L — ABNORMAL LOW (ref 19–32)
CO2: 21 mEq/L (ref 19–32)
CO2: 27 mEq/L (ref 19–32)
Calcium: 8.4 mg/dL (ref 8.4–10.5)
Chloride: 98 mEq/L (ref 96–112)
Chloride: 102 mEq/L (ref 96–112)
Chloride: 103 mEq/L (ref 96–112)
Chloride: 96 mEq/L (ref 96–112)
Creatinine, Ser: 2.11 mg/dL — ABNORMAL HIGH (ref 0.4–1.5)
Creatinine, Ser: 1.05 mg/dL (ref 0.4–1.5)
Creatinine, Ser: 1.19 mg/dL (ref 0.4–1.5)
Creatinine, Ser: 1.23 mg/dL (ref 0.4–1.5)
GFR calc Af Amer: 41 mL/min — ABNORMAL LOW (ref >60)
GFR calc non Af Amer: 34 mL/min — ABNORMAL LOW (ref >60)
GFR calc non Af Amer: >60 mL/min (ref >60)
GFR calc non Af Amer: >60 mL/min (ref >60)
GFR calc non Af Amer: >60 mL/min (ref >60)
Glucose, Bld: 94 mg/dL (ref 70–99)
Glucose, Bld: 165 mg/dL — ABNORMAL HIGH (ref 70–99)
Glucose, Bld: 168 mg/dL — ABNORMAL HIGH (ref 70–99)
Potassium: 4.1 mEq/L (ref 3.5–5.1)
Potassium: 4 mEq/L (ref 3.5–5.1)
Potassium: 4.9 mEq/L (ref 3.5–5.1)
Sodium: 134 mEq/L — ABNORMAL LOW (ref 135–145)
Total Bilirubin: 0.7 mg/dL (ref 0.3–1.2)
Total Bilirubin: 0.4 mg/dL (ref 0.3–1.2)
Total Bilirubin: 0.6 mg/dL (ref 0.3–1.2)
Total Bilirubin: 1.2 mg/dL (ref 0.3–1.2)
Total Protein: 6 g/dL (ref 6.0–8.3)

## 2010-05-22 LAB — BASIC METABOLIC PANEL
BUN: 16 mg/dL (ref 6–23)
BUN: 20 mg/dL (ref 6–23)
BUN: 22 mg/dL (ref 6–23)
BUN: 23 mg/dL (ref 6–23)
BUN: 26 mg/dL — ABNORMAL HIGH (ref 6–23)
BUN: 28 mg/dL — ABNORMAL HIGH (ref 6–23)
BUN: 32 mg/dL — ABNORMAL HIGH (ref 6–23)
BUN: 10 mg/dL (ref 6–23)
BUN: 10 mg/dL (ref 6–23)
BUN: 13 mg/dL (ref 6–23)
BUN: 14 mg/dL (ref 6–23)
BUN: 15 mg/dL (ref 6–23)
BUN: 15 mg/dL (ref 6–23)
BUN: 16 mg/dL (ref 6–23)
BUN: 16 mg/dL (ref 6–23)
BUN: 16 mg/dL (ref 6–23)
BUN: 17 mg/dL (ref 6–23)
BUN: 17 mg/dL (ref 6–23)
BUN: 8 mg/dL (ref 6–23)
CO2: 24 mEq/L (ref 19–32)
CO2: 25 mEq/L (ref 19–32)
CO2: 26 mEq/L (ref 19–32)
CO2: 27 mEq/L (ref 19–32)
CO2: 27 mEq/L (ref 19–32)
CO2: 28 mEq/L (ref 19–32)
CO2: 29 mEq/L (ref 19–32)
CO2: 20 mEq/L (ref 19–32)
CO2: 22 mEq/L (ref 19–32)
CO2: 22 mEq/L (ref 19–32)
CO2: 23 mEq/L (ref 19–32)
CO2: 23 mEq/L (ref 19–32)
CO2: 24 mEq/L (ref 19–32)
CO2: 25 mEq/L (ref 19–32)
CO2: 26 mEq/L (ref 19–32)
CO2: 26 mEq/L (ref 19–32)
CO2: 27 mEq/L (ref 19–32)
CO2: 27 mEq/L (ref 19–32)
CO2: 28 mEq/L (ref 19–32)
Calcium: 7.9 mg/dL — ABNORMAL LOW (ref 8.4–10.5)
Calcium: 7.9 mg/dL — ABNORMAL LOW (ref 8.4–10.5)
Calcium: 8.1 mg/dL — ABNORMAL LOW (ref 8.4–10.5)
Calcium: 8.1 mg/dL — ABNORMAL LOW (ref 8.4–10.5)
Calcium: 8.2 mg/dL — ABNORMAL LOW (ref 8.4–10.5)
Calcium: 8.2 mg/dL — ABNORMAL LOW (ref 8.4–10.5)
Calcium: 8.2 mg/dL — ABNORMAL LOW (ref 8.4–10.5)
Calcium: 8.1 mg/dL — ABNORMAL LOW (ref 8.4–10.5)
Calcium: 8.2 mg/dL — ABNORMAL LOW (ref 8.4–10.5)
Calcium: 8.2 mg/dL — ABNORMAL LOW (ref 8.4–10.5)
Calcium: 8.2 mg/dL — ABNORMAL LOW (ref 8.4–10.5)
Calcium: 8.2 mg/dL — ABNORMAL LOW (ref 8.4–10.5)
Calcium: 8.4 mg/dL (ref 8.4–10.5)
Calcium: 8.4 mg/dL (ref 8.4–10.5)
Calcium: 8.4 mg/dL (ref 8.4–10.5)
Calcium: 8.4 mg/dL (ref 8.4–10.5)
Calcium: 8.5 mg/dL (ref 8.4–10.5)
Calcium: 8.7 mg/dL (ref 8.4–10.5)
Calcium: 8.7 mg/dL (ref 8.4–10.5)
Calcium: 8.7 mg/dL (ref 8.4–10.5)
Calcium: 8.7 mg/dL (ref 8.4–10.5)
Calcium: 8.8 mg/dL (ref 8.4–10.5)
Chloride: 104 mEq/L (ref 96–112)
Chloride: 92 mEq/L — ABNORMAL LOW (ref 96–112)
Chloride: 93 mEq/L — ABNORMAL LOW (ref 96–112)
Chloride: 94 mEq/L — ABNORMAL LOW (ref 96–112)
Chloride: 97 mEq/L (ref 96–112)
Chloride: 98 mEq/L (ref 96–112)
Chloride: 98 mEq/L (ref 96–112)
Chloride: 100 mEq/L (ref 96–112)
Chloride: 105 mEq/L (ref 96–112)
Chloride: 106 mEq/L (ref 96–112)
Chloride: 106 mEq/L (ref 96–112)
Chloride: 106 mEq/L (ref 96–112)
Chloride: 106 mEq/L (ref 96–112)
Chloride: 108 mEq/L (ref 96–112)
Chloride: 98 mEq/L (ref 96–112)
Chloride: 99 mEq/L (ref 96–112)
Creatinine, Ser: 1.53 mg/dL — ABNORMAL HIGH (ref 0.4–1.5)
Creatinine, Ser: 1.61 mg/dL — ABNORMAL HIGH (ref 0.4–1.5)
Creatinine, Ser: 1.87 mg/dL — ABNORMAL HIGH (ref 0.4–1.5)
Creatinine, Ser: 1.92 mg/dL — ABNORMAL HIGH (ref 0.4–1.5)
Creatinine, Ser: 2.4 mg/dL — ABNORMAL HIGH (ref 0.4–1.5)
Creatinine, Ser: 2.43 mg/dL — ABNORMAL HIGH (ref 0.4–1.5)
Creatinine, Ser: 2.49 mg/dL — ABNORMAL HIGH (ref 0.4–1.5)
Creatinine, Ser: 0.89 mg/dL (ref 0.4–1.5)
Creatinine, Ser: 0.93 mg/dL (ref 0.4–1.5)
Creatinine, Ser: 0.95 mg/dL (ref 0.4–1.5)
Creatinine, Ser: 0.96 mg/dL (ref 0.4–1.5)
Creatinine, Ser: 1.01 mg/dL (ref 0.4–1.5)
Creatinine, Ser: 1.03 mg/dL (ref 0.4–1.5)
Creatinine, Ser: 1.11 mg/dL (ref 0.4–1.5)
Creatinine, Ser: 1.11 mg/dL (ref 0.4–1.5)
Creatinine, Ser: 1.16 mg/dL (ref 0.4–1.5)
Creatinine, Ser: 1.27 mg/dL (ref 0.4–1.5)
Creatinine, Ser: 1.36 mg/dL (ref 0.4–1.5)
Creatinine, Ser: 1.37 mg/dL (ref 0.4–1.5)
Creatinine, Ser: 1.39 mg/dL (ref 0.4–1.5)
GFR calc Af Amer: 34 mL/min — ABNORMAL LOW (ref >60)
GFR calc Af Amer: 35 mL/min — ABNORMAL LOW (ref >60)
GFR calc Af Amer: 36 mL/min — ABNORMAL LOW (ref >60)
GFR calc Af Amer: 46 mL/min — ABNORMAL LOW (ref >60)
GFR calc Af Amer: 47 mL/min — ABNORMAL LOW (ref >60)
GFR calc Af Amer: 56 mL/min — ABNORMAL LOW (ref >60)
GFR calc Af Amer: 60 mL/min — ABNORMAL LOW (ref >60)
GFR calc Af Amer: 54 mL/min — ABNORMAL LOW (ref >60)
GFR calc Af Amer: >60 mL/min (ref >60)
GFR calc Af Amer: >60 mL/min (ref >60)
GFR calc Af Amer: >60 mL/min (ref >60)
GFR calc Af Amer: >60 mL/min (ref >60)
GFR calc Af Amer: >60 mL/min (ref >60)
GFR calc Af Amer: >60 mL/min (ref >60)
GFR calc Af Amer: >60 mL/min (ref >60)
GFR calc Af Amer: >60 mL/min (ref >60)
GFR calc Af Amer: >60 mL/min (ref >60)
GFR calc Af Amer: >60 mL/min (ref >60)
GFR calc non Af Amer: 28 mL/min — ABNORMAL LOW (ref >60)
GFR calc non Af Amer: 29 mL/min — ABNORMAL LOW (ref >60)
GFR calc non Af Amer: 29 mL/min — ABNORMAL LOW (ref >60)
GFR calc non Af Amer: 38 mL/min — ABNORMAL LOW (ref >60)
GFR calc non Af Amer: 39 mL/min — ABNORMAL LOW (ref >60)
GFR calc non Af Amer: 47 mL/min — ABNORMAL LOW (ref >60)
GFR calc non Af Amer: 49 mL/min — ABNORMAL LOW (ref >60)
GFR calc non Af Amer: >60 mL/min (ref >60)
GFR calc non Af Amer: >60 mL/min (ref >60)
GFR calc non Af Amer: >60 mL/min (ref >60)
GFR calc non Af Amer: >60 mL/min (ref >60)
GFR calc non Af Amer: >60 mL/min (ref >60)
GFR calc non Af Amer: >60 mL/min (ref >60)
GFR calc non Af Amer: >60 mL/min (ref >60)
GFR calc non Af Amer: >60 mL/min (ref >60)
Glucose, Bld: 156 mg/dL — ABNORMAL HIGH (ref 70–99)
Glucose, Bld: 175 mg/dL — ABNORMAL HIGH (ref 70–99)
Glucose, Bld: 215 mg/dL — ABNORMAL HIGH (ref 70–99)
Glucose, Bld: 56 mg/dL — ABNORMAL LOW (ref 70–99)
Glucose, Bld: 92 mg/dL (ref 70–99)
Glucose, Bld: 94 mg/dL (ref 70–99)
Glucose, Bld: 95 mg/dL (ref 70–99)
Glucose, Bld: 152 mg/dL — ABNORMAL HIGH (ref 70–99)
Glucose, Bld: 158 mg/dL — ABNORMAL HIGH (ref 70–99)
Glucose, Bld: 160 mg/dL — ABNORMAL HIGH (ref 70–99)
Glucose, Bld: 202 mg/dL — ABNORMAL HIGH (ref 70–99)
Glucose, Bld: 218 mg/dL — ABNORMAL HIGH (ref 70–99)
Glucose, Bld: 232 mg/dL — ABNORMAL HIGH (ref 70–99)
Glucose, Bld: 240 mg/dL — ABNORMAL HIGH (ref 70–99)
Glucose, Bld: 245 mg/dL — ABNORMAL HIGH (ref 70–99)
Glucose, Bld: 246 mg/dL — ABNORMAL HIGH (ref 70–99)
Glucose, Bld: 305 mg/dL — ABNORMAL HIGH (ref 70–99)
Glucose, Bld: 536 mg/dL — ABNORMAL HIGH (ref 70–99)
Potassium: 4.1 mEq/L (ref 3.5–5.1)
Potassium: 4.3 mEq/L (ref 3.5–5.1)
Potassium: 4.3 mEq/L (ref 3.5–5.1)
Potassium: 4.8 mEq/L (ref 3.5–5.1)
Potassium: 4.9 mEq/L (ref 3.5–5.1)
Potassium: 5 mEq/L (ref 3.5–5.1)
Potassium: 5.1 mEq/L (ref 3.5–5.1)
Potassium: 3.1 mEq/L — ABNORMAL LOW (ref 3.5–5.1)
Potassium: 3.5 mEq/L (ref 3.5–5.1)
Potassium: 3.7 mEq/L (ref 3.5–5.1)
Potassium: 3.8 mEq/L (ref 3.5–5.1)
Potassium: 3.9 mEq/L (ref 3.5–5.1)
Potassium: 4 mEq/L (ref 3.5–5.1)
Potassium: 4.3 mEq/L (ref 3.5–5.1)
Potassium: 4.3 mEq/L (ref 3.5–5.1)
Potassium: 4.8 mEq/L (ref 3.5–5.1)
Potassium: 4.8 mEq/L (ref 3.5–5.1)
Sodium: 125 mEq/L — ABNORMAL LOW (ref 135–145)
Sodium: 126 mEq/L — ABNORMAL LOW (ref 135–145)
Sodium: 127 mEq/L — ABNORMAL LOW (ref 135–145)
Sodium: 131 mEq/L — ABNORMAL LOW (ref 135–145)
Sodium: 132 mEq/L — ABNORMAL LOW (ref 135–145)
Sodium: 133 mEq/L — ABNORMAL LOW (ref 135–145)
Sodium: 138 mEq/L (ref 135–145)
Sodium: 132 mEq/L — ABNORMAL LOW (ref 135–145)
Sodium: 132 mEq/L — ABNORMAL LOW (ref 135–145)
Sodium: 132 mEq/L — ABNORMAL LOW (ref 135–145)
Sodium: 134 mEq/L — ABNORMAL LOW (ref 135–145)
Sodium: 136 mEq/L (ref 135–145)
Sodium: 137 mEq/L (ref 135–145)
Sodium: 138 mEq/L (ref 135–145)
Sodium: 140 mEq/L (ref 135–145)
Sodium: 144 mEq/L (ref 135–145)

## 2010-05-22 LAB — GLUCOSE, CAPILLARY
Glucose-Capillary: 100 mg/dL — ABNORMAL HIGH (ref 70–99)
Glucose-Capillary: 113 mg/dL — ABNORMAL HIGH (ref 70–99)
Glucose-Capillary: 120 mg/dL — ABNORMAL HIGH (ref 70–99)
Glucose-Capillary: 123 mg/dL — ABNORMAL HIGH (ref 70–99)
Glucose-Capillary: 135 mg/dL — ABNORMAL HIGH (ref 70–99)
Glucose-Capillary: 141 mg/dL — ABNORMAL HIGH (ref 70–99)
Glucose-Capillary: 171 mg/dL — ABNORMAL HIGH (ref 70–99)
Glucose-Capillary: 174 mg/dL — ABNORMAL HIGH (ref 70–99)
Glucose-Capillary: 178 mg/dL — ABNORMAL HIGH (ref 70–99)
Glucose-Capillary: 215 mg/dL — ABNORMAL HIGH (ref 70–99)
Glucose-Capillary: 228 mg/dL — ABNORMAL HIGH (ref 70–99)
Glucose-Capillary: 243 mg/dL — ABNORMAL HIGH (ref 70–99)
Glucose-Capillary: 259 mg/dL — ABNORMAL HIGH (ref 70–99)
Glucose-Capillary: 49 mg/dL — ABNORMAL LOW (ref 70–99)
Glucose-Capillary: 71 mg/dL (ref 70–99)
Glucose-Capillary: 86 mg/dL (ref 70–99)
Glucose-Capillary: 97 mg/dL (ref 70–99)
Glucose-Capillary: 100 mg/dL — ABNORMAL HIGH (ref 70–99)
Glucose-Capillary: 101 mg/dL — ABNORMAL HIGH (ref 70–99)
Glucose-Capillary: 101 mg/dL — ABNORMAL HIGH (ref 70–99)
Glucose-Capillary: 104 mg/dL — ABNORMAL HIGH (ref 70–99)
Glucose-Capillary: 104 mg/dL — ABNORMAL HIGH (ref 70–99)
Glucose-Capillary: 116 mg/dL — ABNORMAL HIGH (ref 70–99)
Glucose-Capillary: 118 mg/dL — ABNORMAL HIGH (ref 70–99)
Glucose-Capillary: 120 mg/dL — ABNORMAL HIGH (ref 70–99)
Glucose-Capillary: 122 mg/dL — ABNORMAL HIGH (ref 70–99)
Glucose-Capillary: 129 mg/dL — ABNORMAL HIGH (ref 70–99)
Glucose-Capillary: 131 mg/dL — ABNORMAL HIGH (ref 70–99)
Glucose-Capillary: 135 mg/dL — ABNORMAL HIGH (ref 70–99)
Glucose-Capillary: 144 mg/dL — ABNORMAL HIGH (ref 70–99)
Glucose-Capillary: 150 mg/dL — ABNORMAL HIGH (ref 70–99)
Glucose-Capillary: 151 mg/dL — ABNORMAL HIGH (ref 70–99)
Glucose-Capillary: 151 mg/dL — ABNORMAL HIGH (ref 70–99)
Glucose-Capillary: 153 mg/dL — ABNORMAL HIGH (ref 70–99)
Glucose-Capillary: 154 mg/dL — ABNORMAL HIGH (ref 70–99)
Glucose-Capillary: 156 mg/dL — ABNORMAL HIGH (ref 70–99)
Glucose-Capillary: 157 mg/dL — ABNORMAL HIGH (ref 70–99)
Glucose-Capillary: 158 mg/dL — ABNORMAL HIGH (ref 70–99)
Glucose-Capillary: 158 mg/dL — ABNORMAL HIGH (ref 70–99)
Glucose-Capillary: 160 mg/dL — ABNORMAL HIGH (ref 70–99)
Glucose-Capillary: 165 mg/dL — ABNORMAL HIGH (ref 70–99)
Glucose-Capillary: 168 mg/dL — ABNORMAL HIGH (ref 70–99)
Glucose-Capillary: 168 mg/dL — ABNORMAL HIGH (ref 70–99)
Glucose-Capillary: 169 mg/dL — ABNORMAL HIGH (ref 70–99)
Glucose-Capillary: 171 mg/dL — ABNORMAL HIGH (ref 70–99)
Glucose-Capillary: 174 mg/dL — ABNORMAL HIGH (ref 70–99)
Glucose-Capillary: 175 mg/dL — ABNORMAL HIGH (ref 70–99)
Glucose-Capillary: 181 mg/dL — ABNORMAL HIGH (ref 70–99)
Glucose-Capillary: 191 mg/dL — ABNORMAL HIGH (ref 70–99)
Glucose-Capillary: 193 mg/dL — ABNORMAL HIGH (ref 70–99)
Glucose-Capillary: 193 mg/dL — ABNORMAL HIGH (ref 70–99)
Glucose-Capillary: 195 mg/dL — ABNORMAL HIGH (ref 70–99)
Glucose-Capillary: 201 mg/dL — ABNORMAL HIGH (ref 70–99)
Glucose-Capillary: 216 mg/dL — ABNORMAL HIGH (ref 70–99)
Glucose-Capillary: 217 mg/dL — ABNORMAL HIGH (ref 70–99)
Glucose-Capillary: 219 mg/dL — ABNORMAL HIGH (ref 70–99)
Glucose-Capillary: 224 mg/dL — ABNORMAL HIGH (ref 70–99)
Glucose-Capillary: 253 mg/dL — ABNORMAL HIGH (ref 70–99)
Glucose-Capillary: 261 mg/dL — ABNORMAL HIGH (ref 70–99)
Glucose-Capillary: 272 mg/dL — ABNORMAL HIGH (ref 70–99)
Glucose-Capillary: 292 mg/dL — ABNORMAL HIGH (ref 70–99)
Glucose-Capillary: 308 mg/dL — ABNORMAL HIGH (ref 70–99)
Glucose-Capillary: 327 mg/dL — ABNORMAL HIGH (ref 70–99)
Glucose-Capillary: 347 mg/dL — ABNORMAL HIGH (ref 70–99)
Glucose-Capillary: 411 mg/dL — ABNORMAL HIGH (ref 70–99)
Glucose-Capillary: 86 mg/dL (ref 70–99)
Glucose-Capillary: 87 mg/dL (ref 70–99)
Glucose-Capillary: 98 mg/dL (ref 70–99)

## 2010-05-22 LAB — POCT I-STAT, CHEM 8
BUN: 11 mg/dL (ref 6–23)
BUN: 16 mg/dL (ref 6–23)
Calcium, Ion: 1.17 mmol/L (ref 1.12–1.32)
Chloride: 106 mEq/L (ref 96–112)
Chloride: 99 mEq/L (ref 96–112)
Creatinine, Ser: 1 mg/dL (ref 0.4–1.5)
Creatinine, Ser: 1.5 mg/dL (ref 0.4–1.5)
Glucose, Bld: 189 mg/dL — ABNORMAL HIGH (ref 70–99)
Potassium: 4.7 mEq/L (ref 3.5–5.1)
Sodium: 138 mEq/L (ref 135–145)
TCO2: 22 mmol/L (ref 0–100)

## 2010-05-22 LAB — MAGNESIUM
Magnesium: 2.5 mg/dL (ref 1.5–2.5)
Magnesium: 1.7 mg/dL (ref 1.5–2.5)
Magnesium: 1.8 mg/dL (ref 1.5–2.5)
Magnesium: 1.8 mg/dL (ref 1.5–2.5)
Magnesium: 1.9 mg/dL (ref 1.5–2.5)
Magnesium: 2.2 mg/dL (ref 1.5–2.5)
Magnesium: 2.5 mg/dL (ref 1.5–2.5)
Magnesium: 2.6 mg/dL — ABNORMAL HIGH (ref 1.5–2.5)

## 2010-05-22 LAB — TSH
TSH: 1.836 u[IU]/mL (ref 0.350–4.500)
TSH: 0.235 u[IU]/mL — ABNORMAL LOW (ref 0.350–4.500)

## 2010-05-22 LAB — CARDIAC PANEL(CRET KIN+CKTOT+MB+TROPI)
Relative Index: INVALID (ref 0.0–2.5)
Total CK: 253 U/L — ABNORMAL HIGH (ref 7–232)
Troponin I: 2.3 ng/mL (ref 0.00–0.06)
Troponin I: 4.05 ng/mL (ref 0.00–0.06)

## 2010-05-22 LAB — PROTIME-INR
INR: 1.07 (ref 0.00–1.49)
INR: 1.1 (ref 0.00–1.49)
INR: 1.49 (ref 0.00–1.49)
Prothrombin Time: 13.8 seconds (ref 11.6–15.2)
Prothrombin Time: 14 seconds (ref 11.6–15.2)
Prothrombin Time: 14.1 seconds (ref 11.6–15.2)
Prothrombin Time: 14.1 seconds (ref 11.6–15.2)
Prothrombin Time: 17.9 seconds — ABNORMAL HIGH (ref 11.6–15.2)

## 2010-05-22 LAB — DIFFERENTIAL
Basophils Absolute: 0 10*3/uL (ref 0.0–0.1)
Basophils Absolute: 0.1 10*3/uL (ref 0.0–0.1)
Basophils Relative: 0 % (ref 0–1)
Basophils Relative: 0 % (ref 0–1)
Eosinophils Relative: 0 % (ref 0–5)
Lymphocytes Relative: 4 % — ABNORMAL LOW (ref 12–46)
Lymphocytes Relative: 6 % — ABNORMAL LOW (ref 12–46)
Lymphs Abs: 0.8 10*3/uL (ref 0.7–4.0)
Lymphs Abs: 1.5 10*3/uL (ref 0.7–4.0)
Monocytes Absolute: 0.6 10*3/uL (ref 0.1–1.0)
Monocytes Absolute: 0.8 10*3/uL (ref 0.1–1.0)
Monocytes Relative: 4 % (ref 3–12)
Neutro Abs: 11 10*3/uL — ABNORMAL HIGH (ref 1.7–7.7)
Neutro Abs: 11.5 10*3/uL — ABNORMAL HIGH (ref 1.7–7.7)
Neutro Abs: 14.4 10*3/uL — ABNORMAL HIGH (ref 1.7–7.7)
Neutrophils Relative %: 84 % — ABNORMAL HIGH (ref 43–77)
Neutrophils Relative %: 90 % — ABNORMAL HIGH (ref 43–77)

## 2010-05-22 LAB — CARBOXYHEMOGLOBIN
Methemoglobin: 1.5 % (ref 0.0–1.5)
O2 Saturation: 62.2 %
Total hemoglobin: 12.1 g/dL — ABNORMAL LOW (ref 13.5–18.0)

## 2010-05-22 LAB — POCT I-STAT 4, (NA,K, GLUC, HGB,HCT)
Glucose, Bld: 115 mg/dL — ABNORMAL HIGH (ref 70–99)
Glucose, Bld: 206 mg/dL — ABNORMAL HIGH (ref 70–99)
Glucose, Bld: 220 mg/dL — ABNORMAL HIGH (ref 70–99)
Glucose, Bld: 241 mg/dL — ABNORMAL HIGH (ref 70–99)
Glucose, Bld: 244 mg/dL — ABNORMAL HIGH (ref 70–99)
HCT: 31 % — ABNORMAL LOW (ref 39.0–52.0)
Hemoglobin: 8.2 g/dL — ABNORMAL LOW (ref 13.0–17.0)
Hemoglobin: 8.8 g/dL — ABNORMAL LOW (ref 13.0–17.0)
Hemoglobin: 9.5 g/dL — ABNORMAL LOW (ref 13.0–17.0)
Potassium: 3.5 mEq/L (ref 3.5–5.1)
Potassium: 3.9 mEq/L (ref 3.5–5.1)
Potassium: 4.7 mEq/L (ref 3.5–5.1)
Sodium: 134 mEq/L — ABNORMAL LOW (ref 135–145)
Sodium: 135 mEq/L (ref 135–145)
Sodium: 136 mEq/L (ref 135–145)
Sodium: 139 mEq/L (ref 135–145)
Sodium: 140 mEq/L (ref 135–145)

## 2010-05-22 LAB — POCT I-STAT 3, ART BLOOD GAS (G3+)
Acid-Base Excess: 2 mmol/L (ref 0.0–2.0)
Acid-Base Excess: 2 mmol/L (ref 0.0–2.0)
Acid-base deficit: 2 mmol/L (ref 0.0–2.0)
Bicarbonate: 22.1 mEq/L (ref 20.0–24.0)
Bicarbonate: 25.1 mEq/L — ABNORMAL HIGH (ref 20.0–24.0)
Bicarbonate: 25.4 mEq/L — ABNORMAL HIGH (ref 20.0–24.0)
Bicarbonate: 27.8 mEq/L — ABNORMAL HIGH (ref 20.0–24.0)
O2 Saturation: 100 %
O2 Saturation: 84 %
O2 Saturation: 88 %
O2 Saturation: 91 %
O2 Saturation: 92 %
Patient temperature: 37.5
TCO2: 23 mmol/L (ref 0–100)
TCO2: 27 mmol/L (ref 0–100)
TCO2: 27 mmol/L (ref 0–100)
TCO2: 28 mmol/L (ref 0–100)
pCO2 arterial: 38.8 mmHg (ref 35.0–45.0)
pCO2 arterial: 39.5 mmHg (ref 35.0–45.0)
pCO2 arterial: 56.5 mmHg — ABNORMAL HIGH (ref 35.0–45.0)
pH, Arterial: 7.256 — ABNORMAL LOW (ref 7.350–7.450)
pH, Arterial: 7.389 (ref 7.350–7.450)
pH, Arterial: 7.417 (ref 7.350–7.450)
pH, Arterial: 7.434 (ref 7.350–7.450)
pO2, Arterial: 51 mmHg — ABNORMAL LOW (ref 80.0–100.0)
pO2, Arterial: 57 mmHg — ABNORMAL LOW (ref 80.0–100.0)
pO2, Arterial: 57 mmHg — ABNORMAL LOW (ref 80.0–100.0)
pO2, Arterial: 64 mmHg — ABNORMAL LOW (ref 80.0–100.0)

## 2010-05-22 LAB — TYPE AND SCREEN: Antibody Screen: NEGATIVE

## 2010-05-22 LAB — LACTIC ACID, PLASMA
Lactic Acid, Venous: 0.7 mmol/L (ref 0.5–2.2)
Lactic Acid, Venous: 1.2 mmol/L (ref 0.5–2.2)
Lactic Acid, Venous: 2.1 mmol/L (ref 0.5–2.2)

## 2010-05-22 LAB — URINE MICROSCOPIC-ADD ON

## 2010-05-22 LAB — PHOSPHORUS
Phosphorus: 2.6 mg/dL (ref 2.3–4.6)
Phosphorus: 4.2 mg/dL (ref 2.3–4.6)

## 2010-05-22 LAB — MRSA PCR SCREENING
MRSA by PCR: NEGATIVE
MRSA by PCR: NEGATIVE

## 2010-05-22 LAB — CULTURE, RESPIRATORY W GRAM STAIN

## 2010-05-22 LAB — HEPARIN LEVEL (UNFRACTIONATED)
Heparin Unfractionated: 0.17 IU/mL — ABNORMAL LOW (ref 0.30–0.70)
Heparin Unfractionated: 0.2 IU/mL — ABNORMAL LOW (ref 0.30–0.70)
Heparin Unfractionated: 0.24 IU/mL — ABNORMAL LOW (ref 0.30–0.70)
Heparin Unfractionated: 0.33 IU/mL (ref 0.30–0.70)
Heparin Unfractionated: 0.38 IU/mL (ref 0.30–0.70)
Heparin Unfractionated: 0.41 IU/mL (ref 0.30–0.70)

## 2010-05-22 LAB — PLATELET INHIBITION P2Y12
P2Y12 % Inhibition: 0 %
Platelet Function  P2Y12: 363 [PRU] (ref 194–418)
Platelet Function Baseline: 305 [PRU] (ref 194–418)

## 2010-05-22 LAB — CREATININE, SERUM
Creatinine, Ser: 1.52 mg/dL — ABNORMAL HIGH (ref 0.4–1.5)
GFR calc Af Amer: >60 mL/min (ref >60)
GFR calc non Af Amer: 50 mL/min — ABNORMAL LOW (ref >60)

## 2010-05-22 LAB — APTT
aPTT: 31 seconds (ref 24–37)
aPTT: 35 seconds (ref 24–37)
aPTT: 36 seconds (ref 24–37)
aPTT: 53 seconds — ABNORMAL HIGH (ref 24–37)

## 2010-05-22 LAB — HEMOGLOBIN A1C: Mean Plasma Glucose: 280 mg/dL — ABNORMAL HIGH (ref <117)

## 2010-05-22 LAB — POCT CARDIAC MARKERS
CKMB, poc: 3.6 ng/mL (ref 1.0–8.0)
Myoglobin, poc: 462 ng/mL (ref 12–200)

## 2010-05-22 LAB — CULTURE, BLOOD (ROUTINE X 2)
Culture: NO GROWTH
Report Status: 6252011

## 2010-05-22 LAB — HEMOGLOBIN AND HEMATOCRIT, BLOOD: HCT: 24.8 % — ABNORMAL LOW (ref 39.0–52.0)

## 2010-05-22 LAB — LIPID PANEL
Cholesterol: 190 mg/dL (ref 0–200)
LDL Cholesterol: 131 mg/dL — ABNORMAL HIGH (ref 0–99)

## 2010-05-22 LAB — BRAIN NATRIURETIC PEPTIDE: Pro B Natriuretic peptide (BNP): 483 pg/mL — ABNORMAL HIGH (ref 0.0–100.0)

## 2010-05-22 LAB — PLATELET COUNT: Platelets: 175 10*3/uL (ref 150–400)

## 2010-05-24 ENCOUNTER — Ambulatory Visit: Payer: Self-pay | Admitting: Family Medicine

## 2010-05-28 ENCOUNTER — Encounter: Payer: Self-pay | Admitting: Cardiovascular Disease

## 2010-06-07 ENCOUNTER — Ambulatory Visit: Payer: Self-pay | Admitting: Cardiovascular Disease

## 2010-06-09 ENCOUNTER — Ambulatory Visit (INDEPENDENT_AMBULATORY_CARE_PROVIDER_SITE_OTHER): Payer: Self-pay | Admitting: *Deleted

## 2010-06-09 DIAGNOSIS — I4901 Ventricular fibrillation: Secondary | ICD-10-CM

## 2010-06-09 DIAGNOSIS — R0989 Other specified symptoms and signs involving the circulatory and respiratory systems: Secondary | ICD-10-CM

## 2010-06-09 DIAGNOSIS — Z95810 Presence of automatic (implantable) cardiac defibrillator: Secondary | ICD-10-CM

## 2010-06-14 NOTE — Progress Notes (Signed)
icd remote check  

## 2010-06-24 ENCOUNTER — Encounter: Payer: Self-pay | Admitting: Family Medicine

## 2010-06-27 ENCOUNTER — Encounter: Payer: Self-pay | Admitting: Family Medicine

## 2010-06-28 ENCOUNTER — Encounter: Payer: Self-pay | Admitting: Family Medicine

## 2010-06-28 ENCOUNTER — Ambulatory Visit (INDEPENDENT_AMBULATORY_CARE_PROVIDER_SITE_OTHER): Payer: Self-pay | Admitting: Family Medicine

## 2010-06-28 VITALS — BP 148/82 | HR 65 | Resp 18 | Ht 69.0 in | Wt 256.1 lb

## 2010-06-28 DIAGNOSIS — R5381 Other malaise: Secondary | ICD-10-CM

## 2010-06-28 DIAGNOSIS — F329 Major depressive disorder, single episode, unspecified: Secondary | ICD-10-CM

## 2010-06-28 DIAGNOSIS — I2510 Atherosclerotic heart disease of native coronary artery without angina pectoris: Secondary | ICD-10-CM

## 2010-06-28 DIAGNOSIS — E785 Hyperlipidemia, unspecified: Secondary | ICD-10-CM

## 2010-06-28 DIAGNOSIS — F17200 Nicotine dependence, unspecified, uncomplicated: Secondary | ICD-10-CM

## 2010-06-28 DIAGNOSIS — E119 Type 2 diabetes mellitus without complications: Secondary | ICD-10-CM

## 2010-06-28 DIAGNOSIS — R5383 Other fatigue: Secondary | ICD-10-CM

## 2010-06-28 DIAGNOSIS — F3289 Other specified depressive episodes: Secondary | ICD-10-CM

## 2010-06-28 MED ORDER — ROSUVASTATIN CALCIUM 10 MG PO TABS: 10.0000 mg | ORAL_TABLET | Freq: Every day | ORAL | Status: DC

## 2010-06-28 MED ORDER — VENLAFAXINE HCL ER 37.5 MG PO CP24: 37.5000 mg | ORAL_CAPSULE | Freq: Every day | ORAL | Status: DC

## 2010-06-28 NOTE — Patient Instructions (Addendum)
F/u in 4months.  hBA1C , lipid, bMP  asap   It is important that you exercise regularly at least 30 minutes 5 times a week. If you develop chest pain, have severe difficulty breathing, or feel very tired, stop exercising immediately and seek medical attention   A healthy diet is rich in fruit, vegetables and whole grains. Poultry fish, nuts and beans are a healthy choice for protein rather then red meat. A low sodium diet and drinking 64 ounces of water daily is generally recommended. Oils and sweet should be limited. Carbohydrates especially for those who are diabetic or overweight, should be limited to 34-45 gram per meal. It is important to eat on a regular schedule, at least 3 times daily. Snacks should be primarily fruits, vegetables or nuts.   New med for depression

## 2010-07-03 ENCOUNTER — Encounter: Payer: Self-pay | Admitting: *Deleted

## 2010-07-04 ENCOUNTER — Ambulatory Visit: Payer: Self-pay | Admitting: Cardiovascular Disease

## 2010-07-10 NOTE — Assessment & Plan Note (Signed)
New diagnosis, pt neither suicidal nor homicidal, a lot of this is situational. Will start med

## 2010-07-10 NOTE — Assessment & Plan Note (Signed)
improved on med. Pt to continue same and follow a low fat diet

## 2010-07-10 NOTE — Assessment & Plan Note (Signed)
Appears stable at this time, however pt never developed chest pain even with an mI

## 2010-07-10 NOTE — Progress Notes (Signed)
  Subjective:    Patient ID: Matthew [name], male    DOB: 1965/06/14, 45 y.o.   MRN: [id number]  HPI  HYPERTENSION Disease Monitoring Blood pressure range-unknown Chest pain- no      Dyspnea- occasionally Medications Compliance- good2no Lightheadedness- no   Edema- no   DIABETES Disease Monitoring Blood Sugar ranges-not testing regularly Polyuria- no New Visual problems- no Medications Compliance- poorHypoglycemic symptoms- no   HYPERLIPIDEMIA Disease Monitoring See symptoms for Hypertension Medications Compliance- fair RUQ pain- no  Muscle aches- no Pt still dipping snuff, states he has been doing this since he was an early teen and little chance of cessation, but is cutting down. Now reports depression over his health, social circumstances, parents' failing health Review of Systems    Denies recent fever or chills. Denies sinus pressure, nasal congestion, ear pain or sore throat. Denies chest congestion, productive cough or wheezing. Denies chest pains, palpitations, paroxysmal nocturnal dyspnea, orthopnea and leg swelling Denies abdominal pain, nausea, vomiting,diarrhea or constipation.  Denies rectal bleeding or change in bowel movement. Denies dysuria, frequency, hesitancy or incontinence. Denies joint pain, swelling and limitation in mobility. Denies headaches, seizure, numbness, or tingling. . Denies skin break down or rash.     Objective:   Physical Exam Patient alert and oriented and in no Cardiopulmonary distress.  HEENT: No facial asymmetry, EOMI, no sinus tenderness, TM's clear, Oropharynx pink and moist.  Neck supple no adenopathy.  Chest: Clear to auscultation bilaterally.  CVS: S1, S2 no murmurs, no S3.  ABD: Soft non tender. Bowel sounds normal.  Ext: No edema  MS: Adequate ROM spine, shoulders, hips and knees.  Skin: Intact, no ulcerations or rash noted.  Psych: Good eye contact, normal affect. Memory intact not anxious but  depressed  appearing and tearful at times CNS: CN 2-12 intact, power, tone and sensation normal throughout.        Assessment & Plan:

## 2010-07-10 NOTE — Assessment & Plan Note (Signed)
Reports decreased use , needs to quit

## 2010-07-10 NOTE — Assessment & Plan Note (Signed)
Uncontrolled, updated lab data needed

## 2010-07-19 NOTE — Assessment & Plan Note (Signed)
OFFICE VISIT   Matthew [name], Matthew [name]  DOB:  1965/06/24                                        September 20, 2009  CHART #:  [id number]   HISTORY:  The patient returns to the office today because of he had  called the office and had noted increased swelling in his lower  extremities.  Dr. Cornelius Moras had started him on 40 of Lasix at the end of last  week.  He has continued to note weeping from his lower extremities.  We  had him come to the office today with a chest x-ray.  He has had some  shortness of breath with exertion, but considering his long complicated  course, he has been getting around relatively well.  He has noted  primarily lower extremity edema bilaterally with weeping serous fluid  both from the vein harvest sites and also even from areas of skin  breakdown in the right leg.   PHYSICAL EXAMINATION:  Today, his blood pressure 138/77, pulse is 79,  respiratory rate is 18, and O2 sats 94%.  His sternum is stable and well  healed.  His defibrillator site has a large number of Steri-Strips on  it, which were not removed, but does not appear to be draining or  infected.  He has slight decreased breath sounds over the left base.  The right is clear.  Both lower extremities have tight edema below the  knees bilaterally and to some degree in the thighs.  There is some  redness around the left endovein harvest site with small amount of  serous fluid draining.   DIAGNOSTIC TESTS:  Chest x-ray done today shows some slight increase of  the left pleural effusion compared to the film at discharge, though it  is not dramatically worse.   The patient had lab work done last week.  Sodium was 137, K was 5.3,  chloride 102, CO2 28, glucose 242, BUN 18, and creatinine 1.32.  The  patient notes that his fingerstick glucoses, they are running around 120  in the mornings and between 150 and 160 in the afternoons.   IMPRESSION:  I have increased the patient's doses of Lasix to  80 mg p.o.  twice a day for 7 days and then reduced it to 80 mg once a day until we  change it.  He was given a prescription for 40 tablets.  In addition, he  was given a prescription for Keflex 500 mg p.o. q.8 h. for 7 days and  given order for later this week on September 23, 2009, when he goes to the  [place] office for wound check on his defibrillator to have a BMET drawn  and check his potassium and creatinine.  I plan to see him back with a  followup chest x-ray on Thursday, September 30, 2009.   Sheliah Plane, MD  Electronically Signed   EG/MEDQ  D:  09/20/2009  T:  09/21/2009  Job:  [id number]   cc:   Veverly Fells. Excell Seltzer, MD

## 2010-07-19 NOTE — Assessment & Plan Note (Signed)
OFFICE VISIT   Matthew [name]  DOB:  1965-03-25                                        September 30, 2009  CHART #:  [id number]   HISTORY:  The patient returns to the office today after his long  complicated course after presenting with out-of-hospital myocardial  infarction, in-hospital cardiac arrest, and subsequent coronary artery  bypass grafting x4 on September 01, 2009.  The patient was seen in the office  about 10 days ago with significant pulmonary vascular overload and had  his Lasix dose increased.  BMET was obtained in Matthew [name] last week  to check his creatinine and potassium showed a creatinine of 1.4 and a K  of 4.3.  Previously, he had been running high potassiums and he also had  a defibrillator placed.  He notes that Dr. Ladona Ridgel is released him to  drive as far as defibrillator is concerned.  Today, he is much improved  from when he was seen 10 days ago.  He still has mild edema at the  ankles, but his legs and calves are markedly improved.  His overall  functional ability is better without significant shortness of breath.   PHYSICAL EXAMINATION:  Today, his blood pressure 130/79, pulse 71,  respiratory rate is 18, O2 sats 98%.  His sternum is stable and well  healed.  Lungs are clear bilaterally.  Lower extremities as noted to  have edema at the ankles, but otherwise much improved.  His endovein  harvest sites are healing well.  The erythema that was at the one left  thigh is now resolved.   The patient has now cut his Lasix dose of 80 mg back to once a day.  He  will complete his current prescription and discontinue should he have  increasing edema, after that he will call to request further  instructions.   PA and lateral chest x-ray again shows defibrillator in good position  with significant improvement on the left pleural effusion and was  present 10 days before.   Overall, the patient has made good progress over the past 10 days,  since  he was last seen in the office.  He was interested in returning to work  with light duty and no lifting, which is okay.  I have warned him about  any heavy lifting over 10-15 pounds for 3 months.  Overall, I am pleased  with his progress especially considering his long and complicated  course.  I have not made him a return appointment to see him but he is  continuing to follow up at [name].  I will be glad to see  him at his or their request at anytime.   Matthew Plane, MD  Electronically Signed   EG/MEDQ  D:  09/30/2009  T:  10/01/2009  Job:  [id number]   cc:   Matthew Fells. Excell Seltzer, MD  Matthew [name], M.D.

## 2010-09-08 ENCOUNTER — Ambulatory Visit (INDEPENDENT_AMBULATORY_CARE_PROVIDER_SITE_OTHER): Payer: Self-pay | Admitting: *Deleted

## 2010-09-08 ENCOUNTER — Other Ambulatory Visit: Payer: Self-pay | Admitting: Internal Medicine

## 2010-09-08 DIAGNOSIS — Z95810 Presence of automatic (implantable) cardiac defibrillator: Secondary | ICD-10-CM

## 2010-09-08 DIAGNOSIS — I472 Ventricular tachycardia, unspecified: Secondary | ICD-10-CM

## 2010-09-08 DIAGNOSIS — I4901 Ventricular fibrillation: Secondary | ICD-10-CM

## 2010-09-10 LAB — REMOTE ICD DEVICE
ATRIAL PACING ICD: 0 pct
CHARGE TIME: 8.5 s
DEV-0020ICD: NEGATIVE
DEVICE MODEL ICD: 168307
FVT: 0
TOT-0006: 20120405000000
TZAT-0001FASTVT: 1
TZAT-0001FASTVT: 2
TZAT-0013FASTVT: 2
TZAT-0018FASTVT: NEGATIVE
TZST-0001FASTVT: 4
TZST-0001FASTVT: 5
TZST-0001FASTVT: 6
TZST-0001FASTVT: 7
TZST-0003FASTVT: 26 J
TZST-0003FASTVT: 36 J
TZST-0003FASTVT: 41 J
TZST-0003FASTVT: 41 J
VENTRICULAR PACING ICD: 0 pct

## 2010-09-12 ENCOUNTER — Emergency Department (HOSPITAL_COMMUNITY): Payer: Self-pay

## 2010-09-12 ENCOUNTER — Encounter (HOSPITAL_COMMUNITY): Payer: Self-pay

## 2010-09-12 ENCOUNTER — Emergency Department (HOSPITAL_COMMUNITY)
Admission: EM | Admit: 2010-09-12 | Discharge: 2010-09-12 | Disposition: A | Discharge status: Home / Self Care | Payer: Self-pay | Attending: Emergency Medicine | Admitting: Emergency Medicine

## 2010-09-12 DIAGNOSIS — Z951 Presence of aortocoronary bypass graft: Secondary | ICD-10-CM | POA: Insufficient documentation

## 2010-09-12 DIAGNOSIS — L0291 Cutaneous abscess, unspecified: Secondary | ICD-10-CM | POA: Insufficient documentation

## 2010-09-12 DIAGNOSIS — E119 Type 2 diabetes mellitus without complications: Secondary | ICD-10-CM | POA: Insufficient documentation

## 2010-09-12 DIAGNOSIS — I4901 Ventricular fibrillation: Secondary | ICD-10-CM | POA: Insufficient documentation

## 2010-09-12 DIAGNOSIS — L0390 Cellulitis, unspecified: Secondary | ICD-10-CM | POA: Insufficient documentation

## 2010-09-12 LAB — DIFFERENTIAL
Basophils Absolute: 0 10*3/uL (ref 0.0–0.1)
Lymphocytes Relative: 15 % (ref 12–46)
Monocytes Relative: 8 % (ref 3–12)
Neutrophils Relative %: 76 % (ref 43–77)

## 2010-09-12 LAB — CBC
HCT: 35.5 % — ABNORMAL LOW (ref 39.0–52.0)
Platelets: 179 10*3/uL (ref 150–400)
RDW: 12.6 % (ref 11.5–15.5)
WBC: 11.8 10*3/uL — ABNORMAL HIGH (ref 4.0–10.5)

## 2010-09-12 LAB — BASIC METABOLIC PANEL
Calcium: 9.4 mg/dL (ref 8.4–10.5)
GFR calc Af Amer: >60 mL/min (ref >60)
GFR calc non Af Amer: >60 mL/min (ref >60)
Potassium: 4.3 mEq/L (ref 3.5–5.1)
Sodium: 134 mEq/L — ABNORMAL LOW (ref 135–145)

## 2010-09-12 LAB — SEDIMENTATION RATE: Sed Rate: 105 mm/hr — ABNORMAL HIGH (ref 0–16)

## 2010-09-12 MED ORDER — CLINDAMYCIN PHOSPHATE 900 MG/6ML IV SOLN
900.0000 mg | Freq: Once | INTRAVENOUS | Status: AC
  Administered 2010-09-12: 900 mg via INTRAVENOUS
  Filled 2010-09-12: qty 6

## 2010-09-12 MED ORDER — KETOROLAC TROMETHAMINE 30 MG/ML IJ SOLN
30.0000 mg | Freq: Once | INTRAMUSCULAR | Status: AC
  Administered 2010-09-12: 30 mg via INTRAVENOUS
  Filled 2010-09-12: qty 1

## 2010-09-12 MED ORDER — HYDROCODONE-ACETAMINOPHEN 5-325 MG PO TABS: 1.0000 | ORAL_TABLET | ORAL | Status: AC | PRN

## 2010-09-12 MED ORDER — SODIUM CHLORIDE 0.9 % IV SOLN
Freq: Once | INTRAVENOUS | Status: AC
  Administered 2010-09-12: 15:00:00 via INTRAVENOUS

## 2010-09-12 MED ORDER — CLINDAMYCIN HCL 300 MG PO CAPS: 300.0000 mg | ORAL_CAPSULE | Freq: Four times a day (QID) | ORAL | Status: AC

## 2010-09-12 NOTE — ED Notes (Signed)
Pt given diabetic tray. Assisted up and to bathroom. No acute distress noted. Pt able to ambulate independently.

## 2010-09-12 NOTE — ED Notes (Signed)
Pt IV site checked, no inflammation or redness noted. Antibiotic complete. NS at [facility]. No acute distress. Pain 5/10 with joint motion in L elbow.

## 2010-09-12 NOTE — ED Notes (Signed)
Pt sent by Urgent care for eval of possibly Septic Elbow. Pt presents with red and swollen left elbow. Pt states approx 1 week ago Saturday pt was bit by insect in the middle of night. Pt symptoms have progressed until pt couldn't tolerate symptoms anymore.

## 2010-09-12 NOTE — ED Notes (Signed)
.  Pt resting in bed. No acute distress noted. Parents at bedside. Medications administered per order. Pt reports pain 5/10 mainly with mvmt of the L elbow. No adverse reaction to medications.

## 2010-09-12 NOTE — ED Provider Notes (Addendum)
History     Chief Complaint  Patient presents with  . Insect Bite   HPI Comments: Patient woke with several sores on his left posterior elbow when sleeping in a cabin one week ago,  At first felt these were insect bites.  The area has become increasingly tender, red and swollen.    Patient is a 45 y.o. male presenting with abscess.  Abscess  This is a new problem. Episode onset: 1 week ago. The onset was gradual. The problem has been gradually worsening. The abscess is present on the left arm. The problem is severe. The abscess is characterized by redness and swelling. Pertinent negatives include no fever.    Past Medical History  Diagnosis Date  . Ventricular fibrillation 08/26/2009  . Acute respiratory failure   . Cardiac arrest   . Pulmonary edema   . Encephalopathy     postanoxic agitation  . Acute on chronic renal insufficiency     postoperatively   . Postoperative anemia due to chronic blood loss   . Volume overload     postoperatively   . Sleep apnea   . Diabetic coma with ketoacidosis     Occured in 1994 where he spent 30 days in a coma . He develpoed infection of his leg and developing pancreatitis during that  time   . Diabetes mellitus     Past Surgical History  Procedure Date  . Coronary artery bypass grafting x4 june 29,2011    x 4  . Tonsillectomy 1998  . Appendectomy mid 1990  . Status  post  tonsillectomy   . Right sided abdominal cyst removal     Family History  Problem Relation Age of Onset  . Stroke      family history   . Diabetes      family history   . Diabetes Mother   . Hypertension Mother   . Hyperlipidemia Mother   . Stroke Father   . Diabetes Sister   . Diabetes Sister     History  Substance Use Topics  . Smoking status: Never Smoker   . Smokeless tobacco: Current User    Types: Snuff  . Alcohol Use: No      Review of Systems  Constitutional: Negative for fever.  HENT: Negative.   Respiratory: Negative.   Cardiovascular:  Negative.   Gastrointestinal: Negative.   Skin: Positive for color change.  Neurological: Negative.   All other systems reviewed and are negative.    Physical Exam  BP 166/85  Pulse 78  Temp(Src) 99.4 F (37.4 C) (Oral)  Resp 20  Ht 5\' 10"  (1.778 m)  Wt 250 lb (113.399 kg)  BMI 35.87 kg/m2  SpO2 98%  Physical Exam  Constitutional: He is oriented to person, place, and time. He appears well-developed and well-nourished.  HENT:  Head: Normocephalic.  Neck: Normal range of motion. Neck supple.  Cardiovascular: Normal rate.   Pulmonary/Chest: Effort normal and breath sounds normal.  Musculoskeletal: He exhibits edema and tenderness.       Arms:      Tender, increased warmth and erythema consistent with cellulitis,  Induration without fluctuance posterior proximal forearm.  Patient does display full flexion at elbow and can pronate and supinate.  Tight sensation with full extension of elbow.  Neurological: He is alert and oriented to person, place, and time.  Skin: There is erythema.    ED Course  Procedures  MDM Spoke with Dr. Hilda Lias regarding care of patient.  Since patient has  ROM,  Not concerned for joint involvement.  Will see in close followup.  Requests sed rate and wound culture,  Recommends posterior splint and sling for comfort,  clinamycin.  Will see pt in office tomorrow.      Candis Musa, PA 09/12/10 2052  Medical screening examination/treatment/procedure(s) were conducted as a shared visit with non-physician practitioner(s) and myself.  I personally evaluated the patient during the encounter   Sunnie Nielsen, MD 10/04/10 [phone]

## 2010-09-15 ENCOUNTER — Encounter (HOSPITAL_COMMUNITY): Payer: Self-pay

## 2010-09-15 LAB — CULTURE, ROUTINE-ABSCESS

## 2010-09-16 NOTE — Progress Notes (Signed)
icd remote check  

## 2010-09-16 NOTE — ED Notes (Signed)
Results received from [facility]. (+) [facility]NC, >/= ED prescription for clindamycin(Cleocin) -> RESISTANT to the same.  Chart to MD office for review.

## 2010-10-28 ENCOUNTER — Encounter: Payer: Self-pay | Admitting: Family Medicine

## 2010-10-31 ENCOUNTER — Encounter: Payer: Self-pay | Admitting: Family Medicine

## 2010-10-31 ENCOUNTER — Other Ambulatory Visit: Payer: Self-pay | Admitting: Internal Medicine

## 2010-10-31 ENCOUNTER — Ambulatory Visit (INDEPENDENT_AMBULATORY_CARE_PROVIDER_SITE_OTHER): Payer: Self-pay | Admitting: Family Medicine

## 2010-10-31 VITALS — BP 138/78 | HR 74 | Ht 69.0 in | Wt 259.0 lb

## 2010-10-31 DIAGNOSIS — F329 Major depressive disorder, single episode, unspecified: Secondary | ICD-10-CM

## 2010-10-31 DIAGNOSIS — E785 Hyperlipidemia, unspecified: Secondary | ICD-10-CM

## 2010-10-31 DIAGNOSIS — I10 Essential (primary) hypertension: Secondary | ICD-10-CM

## 2010-10-31 DIAGNOSIS — I2510 Atherosclerotic heart disease of native coronary artery without angina pectoris: Secondary | ICD-10-CM

## 2010-10-31 DIAGNOSIS — M549 Dorsalgia, unspecified: Secondary | ICD-10-CM

## 2010-10-31 DIAGNOSIS — E119 Type 2 diabetes mellitus without complications: Secondary | ICD-10-CM

## 2010-10-31 DIAGNOSIS — R079 Chest pain, unspecified: Secondary | ICD-10-CM

## 2010-10-31 LAB — GLUCOSE, POCT (MANUAL RESULT ENTRY): POC Glucose: 334

## 2010-10-31 MED ORDER — INSULIN ASPART 100 UNIT/ML ~~LOC~~ SOLN
5.0000 [IU] | Freq: Once | SUBCUTANEOUS | Status: AC
  Administered 2010-10-31: 5 [IU] via SUBCUTANEOUS

## 2010-10-31 NOTE — Patient Instructions (Signed)
F/u in 4 month  You are being referred to cardiology for re -evaluation of your heart based on symptoms you have been having.  Fasting lipid, hepatic and hBA1C as soon as possible.  Pls contact the dept for assistance as discussed

## 2010-10-31 NOTE — Assessment & Plan Note (Signed)
Unstable, increased fatigue with recurrent chest pain and light headedness, needs card re-eval

## 2010-10-31 NOTE — Progress Notes (Signed)
  Subjective:    Patient ID: Matthew [name], male    DOB: [birth year], 45 y.o.   MRN: [id number]  HPI Pt reports that he has not been doing well in the past month. srtates he had 5 spiders bite him, had severe cellulitis of the left forearm which involved the left elbow, he went both to urgent care and orthopedics. 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 C/o poor sleep, on avg 4 hours, long work hours, stressed emotionally Denies sinus pressure, nasal congestion, ear pain or sore throat. Denies chest congestion, productive cough or wheezing. C/o intermittent  chest pains,daily at work with stressed  Out, lightheaded, sometime diaphoresis,palpitations and leg swelling, uses one pillow, and no pND Denies abdominal pain, nausea, vomiting,diarrhea or constipation.   Denies dysuria, frequency, hesitancy or incontinence. Increased  Back pain and stiffness and limitation in mobility for 1 month. Denies headaches, seizures, numbness, or tingling. C/o excessive tearfullness, depression, anxiety and insomnia.overwhelmed due to no insurance Denies skin break down or rash.     Objective:   Physical Exam  Patient alert and oriented and in no cardiopulmonary distress.Anxious and tearful  HEENT: No facial asymmetry, EOMI, no sinus tenderness,  oropharynx pink and moist.  Neck supple no adenopathy.  Chest: Clear to auscultation bilaterally.  CVS: S1, S2 no murmurs, no S3.  ABD: Soft non tender. Bowel sounds normal.  Ext: No edema  MS: Adequate ROM spine, shoulders, hips and knees.  Skin: cracked skin on soles of feet, no active sign of infection  Psych: Good eye contact, normal affect. Memory intact not anxious or depressed appearing.  CNS: CN 2-12 intact, power, tone and sensation normal throughout.       Assessment &  Plan:

## 2010-11-01 ENCOUNTER — Encounter: Payer: Self-pay | Admitting: Physician Assistant

## 2010-11-01 ENCOUNTER — Encounter (INDEPENDENT_AMBULATORY_CARE_PROVIDER_SITE_OTHER): Payer: Self-pay | Admitting: *Deleted

## 2010-11-01 ENCOUNTER — Ambulatory Visit (INDEPENDENT_AMBULATORY_CARE_PROVIDER_SITE_OTHER): Payer: Self-pay | Admitting: Physician Assistant

## 2010-11-01 VITALS — BP 150/90 | HR 73 | Resp 14 | Ht 69.0 in | Wt 257.0 lb

## 2010-11-01 DIAGNOSIS — I2510 Atherosclerotic heart disease of native coronary artery without angina pectoris: Secondary | ICD-10-CM

## 2010-11-01 DIAGNOSIS — I10 Essential (primary) hypertension: Secondary | ICD-10-CM

## 2010-11-01 DIAGNOSIS — I5022 Chronic systolic (congestive) heart failure: Secondary | ICD-10-CM

## 2010-11-01 DIAGNOSIS — R079 Chest pain, unspecified: Secondary | ICD-10-CM

## 2010-11-01 DIAGNOSIS — I472 Ventricular tachycardia: Secondary | ICD-10-CM

## 2010-11-01 DIAGNOSIS — I4901 Ventricular fibrillation: Secondary | ICD-10-CM

## 2010-11-01 DIAGNOSIS — E785 Hyperlipidemia, unspecified: Secondary | ICD-10-CM

## 2010-11-01 DIAGNOSIS — I509 Heart failure, unspecified: Secondary | ICD-10-CM | POA: Insufficient documentation

## 2010-11-01 LAB — BASIC METABOLIC PANEL
CO2: 28 mEq/L (ref 19–32)
Glucose, Bld: 86 mg/dL (ref 70–99)
Potassium: 4 mEq/L (ref 3.5–5.1)
Sodium: 139 mEq/L (ref 135–145)

## 2010-11-01 MED ORDER — NITROGLYCERIN 0.4 MG SL SUBL: 0.4000 mg | SUBLINGUAL_TABLET | SUBLINGUAL | Status: DC | PRN

## 2010-11-01 MED ORDER — METOPROLOL TARTRATE 25 MG PO TABS: 12.5000 mg | ORAL_TABLET | Freq: Two times a day (BID) | ORAL | Status: DC

## 2010-11-01 NOTE — Assessment & Plan Note (Signed)
He is describing a constellation of symptoms including chest pain and shortness of breath as well as erratic heartbeat and near syncope.  My biggest concern is for episodes of ventricular tachycardia.  We were unable to interrogate his device in the office today as there was no staff available.  I discussed his case further with Dr. Excell Seltzer.  We will arrange for him to have a stress Myoview later this week.  We will also arrange for him to have his device interrogated at that visit.  If he is having significant amounts of ventricular tachycardia, we could consider proceeding directly to cardiac catheterization to rule out ischemia as a cause.  I have asked him to increase his Lopressor to twice daily dosing.  Followup in 2 weeks.

## 2010-11-01 NOTE — Assessment & Plan Note (Signed)
He reports "white coat HTN."  His BP was better yesterday at PCPs office.  Increase lopressor as noted.

## 2010-11-01 NOTE — Assessment & Plan Note (Signed)
Proceed with myoview as noted.  Continue ASA and statin.  Follow up in 2 weeks with me or Dr. Excell Seltzer.

## 2010-11-01 NOTE — Progress Notes (Signed)
History of Present Illness: Primary Cardiologist: Dr. Tonny Bollman Primary Electrophysiologist:  Dr. Lewayne Bunting  Matthew [name] is a 45 y.o. male who returns for follow up at the request of his PCP.  He has a history of CAD, status post out of hospital myocardial infarction 6/11.  At that time he presented with post MI systolic heart failure.  He had 3 vessel CAD on cardiac catheterization.  While awaiting CABG, he suffered a ventricular fibrillation arrest that resulted in ventilator dependent respiratory failure and multiorgan system failure.  He eventually recovered and underwent bypass with a L-LAD+Dx, S-OM+dCFX.  He is status post dual-chamber ICD.  He also has a history of diabetes, hyperlipidemia, hypertension and sleep apnea.  Last echo 10/11: Inferoseptal and apical hypokinesis, EF 35%, moderate left atrial enlargement.  He has a very stressful job.  He has been working 20+ hrs a day at times.  He works at a [employer].  He feels that over the last 6 mos or so, he has noted chest tightness across his chest.  It comes on with emotional stress and is associated with dyspnea and nausea.  He has felt near syncopal on a couple of occasions and noted tingling in his hands and feet.  He was able to monitor his pulse one day and it was ranging from 40-120 on his machine.  He denies any shocks from his defibrillator.  He has not had exertional chest pain.  He does note increased DOE.  He is class 2.  No orthopnea or PND.  He does note occasional pedal edema.  His energy level is down.  Of note, he has gained a lot of weight.  He is unsure how his diabetes is controlled.  His sugar was over 300 yesterday at his PCPs office.  No recent A1Cs are available for review.    Past Medical History  Diagnosis Date  . Cardiac arrest - ventricular fibrillation 08/26/2009  . CAD (coronary artery disease)     a. OOH MI 6/11; presented with acute CHF; hosp course c/b VF arrest with VDRF, etc;   b s/p CABG:  L-LAD/Dx, S-OM/dCFX  . Ischemic cardiomyopathy     echo 10/11:  inf-septal and apical HK, EF 35%, mod LAE  . CKD (chronic kidney disease)   . Sleep apnea   . Diabetic coma with ketoacidosis     Occured in 1994 where he spent 30 days in a coma . He develpoed infection of his leg and developing pancreatitis during that  time   . DM2 (diabetes mellitus, type 2)   . Cellulitis 2012    spider bite  . HTN (hypertension)   . HLD (hyperlipidemia)   . Chronic systolic heart failure     Current Outpatient Prescriptions  Medication Sig Dispense Refill  . amiodarone (PACERONE) 200 MG tablet Take 1/2 tablet once a day      . aspirin 325 MG tablet Take 325 mg by mouth daily.        Marland Kitchen b complex vitamins tablet Take 1 tablet by mouth daily.        . Chromium Picolinate 200 MCG CAPS Take 1 capsule by mouth daily.        . Cinnamon 500 MG TABS Take 2 tablets by mouth daily.        Marland Kitchen glipiZIDE (GLUCOTROL) 10 MG tablet Take 10 mg by mouth 2 (two) times daily before a meal.        . insulin glargine (LANTUS) 100  UNIT/ML injection Inject 30 Units into the skin at bedtime.        Marland Kitchen lisinopril (PRINIVIL,ZESTRIL) 10 MG tablet Take 10 mg by mouth daily.        . metoprolol tartrate (LOPRESSOR) 25 MG tablet Take 0.5 tablets (12.5 mg total) by mouth 2 (two) times daily.  30 tablet  6  . Multiple Vitamin (MULTIVITAMIN) tablet Take 1 tablet by mouth daily.        . Omega-3 Fatty Acids (FISH OIL BURP-LESS PO) Take 1 tablet by mouth daily.        . rosuvastatin (CRESTOR) 10 MG tablet Take 10 mg by mouth daily.        Marland Kitchen DISCONTD: metoprolol tartrate (LOPRESSOR) 25 MG tablet Take 1/2 tablet by mouth daily      . DISCONTD: metoprolol tartrate (LOPRESSOR) 25 MG tablet Take 0.5 tablets (12.5 mg total) by mouth 2 (two) times daily. Take 1/2 tablet by mouth daily  30 tablet  6  . nitroGLYCERIN (NITROSTAT) 0.4 MG SL tablet Place 1 tablet (0.4 mg total) under the tongue every 5 (five) minutes as needed for chest pain.  25  tablet  11    Allergies: No Known Allergies  Social history:  Nonsmoker.  No alcohol or drugs  Family history:  Father had a stroke  ROS:  Please see the history of present illness.  He has had a dry cough.  He has had some headaches.  He notes some water brash symptoms off and on.  No dysphagia.  Otherwise, all other systems reviewed and negative.   Vital Signs: BP 150/90  Pulse 73  Resp 14  Ht 5\' 9"  (1.753 m)  Wt 257 lb (116.574 kg)  BMI 37.95 kg/m2 Repeat BP by me on left: 150/80  PHYSICAL EXAM: Well nourished, well developed, in no acute distress HEENT: normal Neck: no JVD Vascular: No carotid bruits Cardiac:  normal S1, S2; RRR; no murmur; No S3 Lungs:  clear to auscultation bilaterally, no wheezing, rhonchi or rales Abd: soft, nontender, no hepatomegaly Ext: Trace bilateral edema Skin: warm and dry Neuro:  CNs 2-12 intact, no focal abnormalities noted Psych: Normal affect  EKG:  Sinus rhythm, heart rate 73, left axis deviation, nonspecific ST-T wave changes, no significant change when compared to prior tracing  ASSESSMENT AND PLAN:

## 2010-11-01 NOTE — Patient Instructions (Addendum)
Your physician recommends that you schedule a follow-up appointment in: 2 WEEKS WITH DR. Excell Seltzer, IF NOT AVAILABLE THEN SCHEDULE WITH SCOTT WEAVER, PA-C SAME DAY DR. Excell Seltzer IS IN THE OFFICE.  PT NEEDS A DEVICE CHECK THE SAME DAY HE IS HAVING GXT MYOVIEW ; PLEASE HAVE DEVICE CHECKED BEFORE DOING THE GXT MYOVIEW, PER SCOTT WEAVER, PA-C.    Your physician has requested that you have en exercise stress myoview DX [phone]. For further information please visit [email or web address] Please follow instruction sheet, as given.  Your physician recommends that you return for lab work in: TODAY BMET, BNP 786.50, 428.22

## 2010-11-01 NOTE — Assessment & Plan Note (Addendum)
He remains on amiodarone and metoprolol.  Increase metoprolol to BID.  Continue amiodarone.  Interrogate device . . . .someone from industry was able to come in and interrogate his device.  Interrogation demonstrated:  No NSVT or VTach or high rate atrial episodes.

## 2010-11-01 NOTE — Assessment & Plan Note (Signed)
He does not appear volume overloaded.  Check a BMET and BNP today.  Start Lasix if needed.  Consider changing to Coreg in the future (will see what his EF is on the [facility]).  He does note a cough.  May need to consider changing his ACE to an ARB.  Also, consider spironolactone in the future as well.

## 2010-11-01 NOTE — Assessment & Plan Note (Signed)
Managed by PCP.  Goal LDL less than 70.  

## 2010-11-02 ENCOUNTER — Telehealth: Payer: Self-pay | Admitting: *Deleted

## 2010-11-02 LAB — ICD DEVICE OBSERVATION
AL THRESHOLD: 0.3 V
BAMS-0002: 8 ms
CHARGE TIME: 8.493 s
DEVICE MODEL ICD: 168307
RV LEAD AMPLITUDE: 17.3 mv
TOT-0001: 2
TZAT-0001FASTVT: 1
TZAT-0001FASTVT: 2
TZAT-0004FASTVT: 8
TZAT-0012FASTVT: 220 ms
TZAT-0013FASTVT: 2
TZAT-0020FASTVT: 1 ms
TZON-0004FASTVT: 2.5
TZST-0001FASTVT: 3
TZST-0001FASTVT: 5
TZST-0001FASTVT: 6
TZST-0001FASTVT: 8
TZST-0003FASTVT: 26 J
TZST-0003FASTVT: 41 J
TZST-0003FASTVT: 41 J

## 2010-11-02 NOTE — Assessment & Plan Note (Signed)
Deteriorated, but want no medication, not suicidal or homicidal

## 2010-11-02 NOTE — Telephone Encounter (Signed)
pt aware of lab results today.  Matthew [name]  

## 2010-11-02 NOTE — Assessment & Plan Note (Signed)
Uncontrolled, pt's compliance as far as diet , blood sugar monitoring and probably medication also is sub optimal. Overwhelmed with psychosocial issues

## 2010-11-02 NOTE — Assessment & Plan Note (Signed)
Multiple symptoms of concern for cardiac decompensation. EKG shows sinus rhythm with no acute ischemia. Urgebnt cardiology referral needed however, based on his established disease

## 2010-11-03 ENCOUNTER — Ambulatory Visit (HOSPITAL_COMMUNITY): Payer: Self-pay | Attending: Cardiovascular Disease | Admitting: Radiology

## 2010-11-03 DIAGNOSIS — I25810 Atherosclerosis of coronary artery bypass graft(s) without angina pectoris: Secondary | ICD-10-CM

## 2010-11-03 DIAGNOSIS — R0602 Shortness of breath: Secondary | ICD-10-CM

## 2010-11-03 DIAGNOSIS — R0609 Other forms of dyspnea: Secondary | ICD-10-CM

## 2010-11-03 DIAGNOSIS — R55 Syncope and collapse: Secondary | ICD-10-CM

## 2010-11-03 DIAGNOSIS — E119 Type 2 diabetes mellitus without complications: Secondary | ICD-10-CM

## 2010-11-03 DIAGNOSIS — R079 Chest pain, unspecified: Secondary | ICD-10-CM | POA: Insufficient documentation

## 2010-11-03 DIAGNOSIS — I2510 Atherosclerotic heart disease of native coronary artery without angina pectoris: Secondary | ICD-10-CM

## 2010-11-03 MED ORDER — TECHNETIUM TC 99M TETROFOSMIN IV KIT
11.0000 | PACK | Freq: Once | INTRAVENOUS | Status: AC | PRN
  Administered 2010-11-03: 11 via INTRAVENOUS

## 2010-11-03 MED ORDER — REGADENOSON 0.4 MG/5ML IV SOLN
0.4000 mg | Freq: Once | INTRAVENOUS | Status: AC
  Administered 2010-11-03: 0.4 mg via INTRAVENOUS

## 2010-11-03 MED ORDER — TECHNETIUM TC 99M TETROFOSMIN IV KIT
33.0000 | PACK | Freq: Once | INTRAVENOUS | Status: AC | PRN
  Administered 2010-11-03: 33 via INTRAVENOUS

## 2010-11-03 NOTE — Progress Notes (Signed)
[facility] SITE 3 NUCLEAR MED [address] [place] [address] [phone]  Cardiology Nuclear Med Study  Matthew [name] is a 45 y.o. male [id number] September 06, 1965   Nuclear Med Background Indication for Stress Test:  Evaluation for Ischemia and Graft Patency History: 06/11 CABGx4, Defibrillator, 10/11Echo: EF 35%, 06/11Heart Catheterization: 3V CAD and 06/11 Myocardial Infarction; Cardiac Arrest Cardiac Risk Factors: Hypertension, IDDM Type 2 and Lipids  Symptoms:  Chest Tightness with Exertion, DOE, Nausea, Near Syncope, Palpitations, Rapid HR and SOB   Nuclear Pre-Procedure Caffeine/Decaff Intake:  None NPO After: 11:00pm   Lungs: clear IV 0.9% NS with Angio Cath:  20g  IV Site: R Hand  IV Started by:  Bonnita Levan, RN  Chest Size (in):  54 Cup Size: n/a  Height: 5\' 9"  (1.753 m)  Weight:  258 lb (117.028 kg)  BMI:  Body mass index is 38.10 kg/(m^2). Tech Comments:  Patient held Metoprolol    Nuclear Med Study 1 or 2 day study: 1 day  Stress Test Type:  Eugenie Birks  Reading MD: Dietrich Pates, MD  Order Authorizing Provider:  S.Weaver/M.Cooper  Resting Radionuclide: Technetium 46m Tetrofosmin  Resting Radionuclide Dose: 11.0 mCi   Stress Radionuclide:  Technetium 1m Tetrofosmin  Stress Radionuclide Dose: 33.0 mCi           Stress Protocol Rest HR: 69 Stress HR: 80  Rest BP: 158/62 Stress BP: 130/62  Exercise Time (min): n/a METS: n/a   Predicted Max HR: 174 bpm % Max HR: 45.98 bpm Rate Pressure Product: 21308   Dose of Adenosine (mg):  n/a Dose of Lexiscan: 0.4 mg  Dose of Atropine (mg): n/a Dose of Dobutamine: n/a mcg/kg/min (at max HR)  Stress Test Technologist: Milana Na, EMT-P  Nuclear Technologist:  Doyne Keel, CNMT     Rest Procedure:  Myocardial perfusion imaging was performed at rest 45 minutes following the intravenous administration of Technetium 67m Tetrofosmin. Rest ECG: NSR  Stress Procedure:  The patient received IV  Lexiscan 0.4 mg over 15-seconds.  Technetium 56m Tetrofosmin injected at 30-seconds.  There were no significant changes with Lexiscan.  Quantitative spect images were obtained after a 45 minute delay. Stress ECG: No significant change from baseline ECG  QPS Raw Data Images:  Soft tissue (diaphragm) underlies the inferior wall. Stress Images: Defect in the inferior/inferolateral wall (base, mid, minimally distal).  Otherwise normal perfusion. Rest Images: Mild improvement in the distal region; otherwise no significant change. Subtraction (SDS):  No significant ischemia. Transient Ischemic Dilatation (Normal <1.22):  1.37 Lung/Heart Ratio (Normal <0.45):  0.41  Quantitative Gated Spect Images QGS EDV:  148 ml QGS ESV:  69 ml QGS cine images:  Basal inferior hypokinesis. QGS EF: 53%  Impression Exercise Capacity:  Lexiscan with no exercise. BP Response:  Normal blood pressure response. Clinical Symptoms:  Mild chest pain/dyspnea. ECG Impression:  No significant ST segment change suggestive of ischemia. Comparison with Prior Nuclear Study: No prior study.  Overall Impression:  Myoview scan with evidence of scar with minimal periinfarct ishemia in the inferior/inferolateral wall.  Cannot exclude some concomitant soft tissue attenuation.

## 2010-11-09 ENCOUNTER — Encounter: Payer: Self-pay | Admitting: Family Medicine

## 2010-11-15 ENCOUNTER — Encounter: Payer: Self-pay | Admitting: Family Medicine

## 2010-11-15 ENCOUNTER — Encounter: Payer: Self-pay | Admitting: *Deleted

## 2010-11-15 ENCOUNTER — Ambulatory Visit (INDEPENDENT_AMBULATORY_CARE_PROVIDER_SITE_OTHER): Payer: Self-pay | Admitting: Family Medicine

## 2010-11-15 VITALS — BP 164/92 | HR 64 | Resp 16 | Ht 69.0 in | Wt 259.4 lb

## 2010-11-15 DIAGNOSIS — I10 Essential (primary) hypertension: Secondary | ICD-10-CM

## 2010-11-15 DIAGNOSIS — E669 Obesity, unspecified: Secondary | ICD-10-CM

## 2010-11-15 DIAGNOSIS — I5022 Chronic systolic (congestive) heart failure: Secondary | ICD-10-CM

## 2010-11-15 DIAGNOSIS — E119 Type 2 diabetes mellitus without complications: Secondary | ICD-10-CM

## 2010-11-15 DIAGNOSIS — F17200 Nicotine dependence, unspecified, uncomplicated: Secondary | ICD-10-CM

## 2010-11-15 DIAGNOSIS — I2510 Atherosclerotic heart disease of native coronary artery without angina pectoris: Secondary | ICD-10-CM

## 2010-11-15 MED ORDER — INSULIN ASPART 100 UNIT/ML ~~LOC~~ SOLN: 3.0000 [IU] | Freq: Once | SUBCUTANEOUS | Status: DC

## 2010-11-15 MED ORDER — LISINOPRIL 20 MG PO TABS: 20.0000 mg | ORAL_TABLET | Freq: Every day | ORAL | Status: DC

## 2010-11-15 NOTE — Progress Notes (Signed)
  Subjective:    Patient ID: Matthew [name], male    DOB: [birth year], 45 y.o.   MRN: [id number]  HPI One day ago felt queezy , light headed, felt 'off" left work took NTG tablets for relief of chest pressure. Feels tired and incapable of work at this time. Reports increased exertional fatigue with chest pain. Denies PND , otrhopnea or dyspnea     Review of Systems See HPI Denies recent fever or chills. Denies sinus pressure, nasal congestion, ear pain or sore throat. Denies chest congestion, productive cough or wheezing. Denies abdominal pain, nausea, vomiting,diarrhea or constipation.   Denies dysuria, frequency, hesitancy or incontinence. Denies joint pain, swelling and limitation in mobility. Denies headaches, seizures, numbness, or tingling. Mild to moderate anxiety and depression over life stresses as well as his health, on no medication, not suicidal or homicidal, wants no medication. Denies skin break down or rash.        Objective:   Physical Exam Patient alert and oriented and in no cardiopulmonary distress.Ill appearing  HEENT: No facial asymmetry, EOMI, no sinus tenderness,  oropharynx pink and moist.  Neck supple no adenopathy.No JVD  Chest: Clear to auscultation bilaterally.decreased air entry  CVS: S1, S2 no murmurs, no S3.  ABD: Soft non tender. Bowel sounds normal.  Ext: No edema  MS: Adequate ROM spine, shoulders, hips and knees.  Skin: Intact, no ulcerations or rash noted.  Psych: Good eye contactflat affect. Memory intact , both anxious and depressed appearing.  CNS: CN 2-12 intact, power, tone and sensation normal throughout.        Assessment & Plan:

## 2010-11-15 NOTE — Patient Instructions (Addendum)
F/u as before.  You need to increase the lantus to start 40 units at bedtime, after 2 weeks if fasting average above 140 then increase to 45 units.  Dose increase on lisinopril effective today, ok to take two 10mg  tabs once daily till done.  Work excuse to return 11/21/2010.  pls continue to reduce snuff use

## 2010-11-18 ENCOUNTER — Encounter: Payer: Self-pay | Admitting: *Deleted

## 2010-11-18 ENCOUNTER — Encounter: Payer: Self-pay | Admitting: Cardiovascular Disease

## 2010-11-18 ENCOUNTER — Ambulatory Visit (INDEPENDENT_AMBULATORY_CARE_PROVIDER_SITE_OTHER): Payer: Self-pay | Admitting: Cardiovascular Disease

## 2010-11-18 VITALS — BP 152/90 | HR 68

## 2010-11-18 DIAGNOSIS — I2510 Atherosclerotic heart disease of native coronary artery without angina pectoris: Secondary | ICD-10-CM

## 2010-11-18 DIAGNOSIS — E785 Hyperlipidemia, unspecified: Secondary | ICD-10-CM

## 2010-11-18 DIAGNOSIS — I10 Essential (primary) hypertension: Secondary | ICD-10-CM

## 2010-11-18 DIAGNOSIS — I472 Ventricular tachycardia: Secondary | ICD-10-CM

## 2010-11-18 LAB — CBC WITH DIFFERENTIAL/PLATELET
Basophils Relative: 0 % (ref 0–1)
Eosinophils Absolute: 0.1 10*3/uL (ref 0.0–0.7)
Eosinophils Relative: 1 % (ref 0–5)
Hemoglobin: 13.2 g/dL (ref 13.0–17.0)
MCH: 28.2 pg (ref 26.0–34.0)
MCHC: 34.3 g/dL (ref 30.0–36.0)
MCV: 82.3 fL (ref 78.0–100.0)
Monocytes Absolute: 0.7 10*3/uL (ref 0.1–1.0)
Monocytes Relative: 7 % (ref 3–12)
Neutrophils Relative %: 66 % (ref 43–77)

## 2010-11-18 LAB — PROTIME-INR: Prothrombin Time: 13.3 seconds (ref 11.6–15.2)

## 2010-11-18 LAB — BASIC METABOLIC PANEL
BUN: 17 mg/dL (ref 6–23)
Glucose, Bld: 274 mg/dL — ABNORMAL HIGH (ref 70–99)
Potassium: 4.8 mEq/L (ref 3.5–5.3)

## 2010-11-18 NOTE — Assessment & Plan Note (Signed)
No device therapies have been delivered. The patient remains on low-dose amiodarone.

## 2010-11-18 NOTE — Assessment & Plan Note (Signed)
The patient is on a statin drug. His last LDL cholesterol in our system one year ago was 49

## 2010-11-18 NOTE — Patient Instructions (Signed)
Your physician recommends that you schedule a follow-up appointment in: 4 weeks  Your physician has requested that you have a cardiac catheterization. Cardiac catheterization is used to diagnose and/or treat various heart conditions. Doctors may recommend this procedure for a number of different reasons. The most common reason is to evaluate chest pain. Chest pain can be a symptom of coronary artery disease (CAD), and cardiac catheterization can show whether plaque is narrowing or blocking your heart's arteries. This procedure is also used to evaluate the valves, as well as measure the blood flow and oxygen levels in different parts of your heart. For further information please visit [email or web address] Please follow instruction sheet, as given. Scheduled for Sept. 18, 2012

## 2010-11-18 NOTE — Assessment & Plan Note (Signed)
The control is suboptimal. Will review his hemodynamics and blood pressure again next week at the time of cardiac catheterization and likely will recommend medication adjustments at that time.

## 2010-11-18 NOTE — Assessment & Plan Note (Addendum)
The patient is now greater than one year out from coronary bypass surgery. He has diabetes and is at risk for early graft failure. His blood pressure, diabetes, and weight are all under poor control. The new onset of cardiovascular symptoms is concerning. I have recommended diagnostic cardiac catheterization plus or minus PCI if required. Risks, alternatives, and indications of the procedure were reviewed in detail with the patient who agrees to proceed. He will continue on his current medical program. If he develops chest pain unresponsive to nitroglycerin he understands to call 911.  Of note, the patient had a lax scan Myoview stress that showed inferior infarction with peri-infarct ischemia. While overall this was a low risk study, his symptoms are of enough concern that I think cardiac catheter is warranted.

## 2010-11-18 NOTE — Progress Notes (Signed)
HPI:  This is a 45 year old gentleman with coronary artery disease presenting for followup evaluation. The patient presented one year ago with an out-of-hospital MI. He ultimately was found to have severe three-vessel coronary artery disease he underwent coronary bypass surgery. He was waiting for bypass surgery he had a ventricular fibrillation arrest and went into cardiogenic shock. He ultimately got through multivessel bypass surgery and then had ICD implantation after his LV function did not recover.  The patient has had no shocks were device therapies.  He has done well until the last several weeks when he has developed recurrent shortness of breath, chest pressure, diaphoresis, and fatigue. He has not had typical exertional chest pressure, but has had sudden onset on a few different occasions of a feeling of pressure across his anterior chest. He has taken nitroglycerin with some relief. He also complains of left leg edema, left arm tingling, and increasing stress and anxiety.  Outpatient Encounter Prescriptions as of 11/18/2010  Medication Sig Dispense Refill  . amiodarone (PACERONE) 200 MG tablet Take 1/2 tablet once a day      . aspirin 325 MG tablet Take 325 mg by mouth daily.        Marland Kitchen b complex vitamins tablet Take 1 tablet by mouth daily.        . Chromium Picolinate 200 MCG CAPS Take 1 capsule by mouth daily.        . Cinnamon 500 MG TABS Take 2 tablets by mouth daily.        Marland Kitchen glipiZIDE (GLUCOTROL) 10 MG tablet Take 10 mg by mouth 2 (two) times daily before a meal.        . insulin glargine (LANTUS) 100 UNIT/ML injection Inject 30 Units into the skin at bedtime.        Marland Kitchen lisinopril (PRINIVIL,ZESTRIL) 20 MG tablet Take 1 tablet (20 mg total) by mouth daily.  30 tablet  11  . metoprolol tartrate (LOPRESSOR) 25 MG tablet Take 0.5 tablets (12.5 mg total) by mouth 2 (two) times daily.  30 tablet  6  . Multiple Vitamin (MULTIVITAMIN) tablet Take 1 tablet by mouth daily.        . nitroGLYCERIN  (NITROSTAT) 0.4 MG SL tablet Place 1 tablet (0.4 mg total) under the tongue every 5 (five) minutes as needed for chest pain.  25 tablet  11  . Omega-3 Fatty Acids (FISH OIL BURP-LESS PO) Take 1 tablet by mouth daily.        . rosuvastatin (CRESTOR) 10 MG tablet Take 10 mg by mouth daily.         Facility-Administered Encounter Medications as of 11/18/2010  Medication Dose Route Frequency Provider Last Rate Last Dose  . insulin aspart (novoLOG) injection 3 Units  3 Units Subcutaneous Once Syliva Overman, MD        No Known Allergies  Past Medical History  Diagnosis Date  . Cardiac arrest - ventricular fibrillation 08/26/2009  . CAD (coronary artery disease)     a. OOH MI 6/11; presented with acute CHF; hosp course c/b VF arrest with VDRF, etc;   b s/p CABG: L-LAD/Dx, S-OM/dCFX  . Ischemic cardiomyopathy     echo 10/11:  inf-septal and apical HK, EF 35%, mod LAE  . CKD (chronic kidney disease)   . Sleep apnea   . Diabetic coma with ketoacidosis     Occured in 1994 where he spent 30 days in a coma . He develpoed infection of his leg and developing pancreatitis during that  time   . DM2 (diabetes mellitus, type 2)   . Cellulitis 2012    spider bite  . HTN (hypertension)   . HLD (hyperlipidemia)   . Chronic systolic heart failure     ROS: Negative except as per HPI  BP 152/90  Pulse 68  PHYSICAL EXAM: Pt is alert and oriented, obese male in NAD HEENT: normal Neck: JVP - normal, carotids 2+= without bruits Lungs: CTA bilaterally CV: RRR without murmur or gallop Abd: soft, NT, Positive BS, no hepatomegaly Ext: no C/C/E, distal pulses intact and equal Skin: warm/dry no rash  EKG:  Normal sinus rhythm with left axis deviation, T wave abnormality consider lateral ischemia. Heart rate 68 beats per minute.  ASSESSMENT AND PLAN:

## 2010-11-22 ENCOUNTER — Ambulatory Visit (HOSPITAL_COMMUNITY): Admission: RE | Admit: 2010-11-22 | Payer: Self-pay | Source: Ambulatory Visit | Admitting: Cardiology

## 2010-11-22 ENCOUNTER — Inpatient Hospital Stay (HOSPITAL_BASED_OUTPATIENT_CLINIC_OR_DEPARTMENT_OTHER)
Admission: RE | Admit: 2010-11-22 | Discharge: 2010-11-22 | Disposition: A | Discharge status: Home / Self Care | Payer: Self-pay | Source: Ambulatory Visit | Attending: Cardiology | Admitting: Cardiology

## 2010-11-22 DIAGNOSIS — R42 Dizziness and giddiness: Secondary | ICD-10-CM | POA: Insufficient documentation

## 2010-11-22 DIAGNOSIS — I2510 Atherosclerotic heart disease of native coronary artery without angina pectoris: Secondary | ICD-10-CM | POA: Insufficient documentation

## 2010-11-22 DIAGNOSIS — E119 Type 2 diabetes mellitus without complications: Secondary | ICD-10-CM | POA: Insufficient documentation

## 2010-11-22 DIAGNOSIS — Z794 Long term (current) use of insulin: Secondary | ICD-10-CM | POA: Insufficient documentation

## 2010-11-22 DIAGNOSIS — R0602 Shortness of breath: Secondary | ICD-10-CM | POA: Insufficient documentation

## 2010-11-22 DIAGNOSIS — Z951 Presence of aortocoronary bypass graft: Secondary | ICD-10-CM | POA: Insufficient documentation

## 2010-11-22 DIAGNOSIS — R079 Chest pain, unspecified: Secondary | ICD-10-CM | POA: Insufficient documentation

## 2010-11-23 LAB — POCT I-STAT GLUCOSE: Glucose, Bld: 246 mg/dL — ABNORMAL HIGH (ref 70–99)

## 2010-12-04 NOTE — Assessment & Plan Note (Signed)
Uncontrolled, pt absolutely needs to have updated labs, dose increase of lantus discussed

## 2010-12-04 NOTE — Assessment & Plan Note (Signed)
Reports deterioration based on symptoms, needs to be re evaluated by cardiology

## 2010-12-04 NOTE — Assessment & Plan Note (Signed)
Still using nicotine daily , but reports reduced use

## 2010-12-04 NOTE — Assessment & Plan Note (Signed)
Reports increased fatigue an the need to use nTG, taken out of work with close card re eval

## 2010-12-04 NOTE — Assessment & Plan Note (Signed)
unchanged Patient re-educated about  the importance of commitment to a  minimum of 150 minutes of exercise per week. The importance of healthy food choices with portion control discussed. Encouraged to start a food diary, count calories and to consider  joining a support group. Sample diet sheets offered. Goals set by the patient for the next several months.    

## 2010-12-08 ENCOUNTER — Encounter: Payer: Self-pay | Admitting: Internal Medicine

## 2010-12-08 ENCOUNTER — Other Ambulatory Visit: Payer: Self-pay | Admitting: Internal Medicine

## 2010-12-08 ENCOUNTER — Encounter: Payer: Self-pay | Admitting: *Deleted

## 2010-12-09 ENCOUNTER — Ambulatory Visit (INDEPENDENT_AMBULATORY_CARE_PROVIDER_SITE_OTHER): Payer: Self-pay | Admitting: *Deleted

## 2010-12-09 DIAGNOSIS — I472 Ventricular tachycardia: Secondary | ICD-10-CM

## 2010-12-09 DIAGNOSIS — Z95810 Presence of automatic (implantable) cardiac defibrillator: Secondary | ICD-10-CM

## 2010-12-15 NOTE — Cardiovascular Report (Signed)
[name], Matthew [name]               ACCOUNT NO.:  [id number]  MEDICAL RECORD NO.:  [id number]  LOCATION:  [facility]                         FACILITY:  [facility]  PHYSICIAN:  Arturo Morton. Riley Kill, MD, FACCDATE OF BIRTH:  1966/02/27  DATE OF PROCEDURE: DATE OF DISCHARGE:                           CARDIAC CATHETERIZATION   INDICATIONS:  This is a 45 year old gentleman who has coronary artery disease, who presented a year ago with an out-of-hospital myocardial infarction, had a catheterization and was referred for bypass surgery. Awaiting for bypass surgery, he had ventricular fibrillation arrest and went into cardiogenic shock.  He ultimately recovered, and underwent early revascularization surgery.  At that time, he had an internal mammary placed to the diagonal and LAD and a vein graft to the circumflex system with 2 sequential marginals.  After this, the patient had about a 50% right coronary stenosis at that time.  His LV had subsequently recovered.  He does have a nuclear scan that shows some peri-infarct ischemia as well as ejection fraction above 50%.  The patient has had dizziness.  He has had some shortness of breath as well as some mild chest discomfort.  In reference to this, it was recommended that he undergo cardiac cath.  The patient does have insulin-requiring diabetes mellitus.  Risks, benefits, and alternatives were discussed and the patient consented to proceed.  PROCEDURES: 1. Left heart catheterization. 2. Selective coronary arteriography. 3. Saphenous vein graft angiography. 4. Selective left internal mammary angiography.  DESCRIPTION OF PROCEDURE:  The patient was brought to the catheterization laboratory.  His laboratories were reviewed and we discussed the case with him.  He was agreeable to proceed.  Through an anterior puncture, femoral artery was entered and a 4-French sheath was placed.  We were able to take views of the left coronary artery and right coronary  artery.  Internal mammary was injected with a 3D RC catheter.  The left vein graft required a left bypass catheter and even this did not quite reach.  However, good angiographic views were obtained.  We measured LV pressures, but because of the patient's diabetes and mild elevation of serum creatinine, we elected not to do ventriculography.  There were no major complications.  HEMODYNAMIC DATA: 1. The central aortic pressure is 114/71, mean 90. 2. LV pressure 141/80. 3. There was not a gradient pullback across aortic valve.  ANGIOGRAPHIC DATA: 1. The left main is free of critical disease. 2. The LAD has severe disease.  There is a 90% stenosis after the     takeoff the smaller first diagonal.  The first diagonal is a small-     caliber vessel that has a typical diabetic appearance, perhaps up     to 70% narrowing proximally, but it is difficult to grade because     of the small caliber and diffuse nature of the disease.  The large     diagonal branch has evidence of competitive filling distally and     has a high-grade proximal stenosis of 95% proximally.  The LAD, as     noted, is diseased. 3. The sequential internal mammary graft to the diagonal and LAD are     widely patent.  There is excellent runoff into the diagonal and LAD     system. 4. The circumflex provides a tiny ramus intermedius without     significant disease.  There is a tiny first marginal and the     circumflex is totally occluded. 5. The saphenous vein graft is a large-caliber graft that goes to 2     marginal branches.  The graft as well as the branches are widely     patent. 6. The right coronary artery is a dominant vessel providing posterior     ascending and small posterolateral system.  There is a 60%-70% area     of focal stenosis in the RCA.  This is proximal to the proximal mid     junction.  The old films are not available to review at the present     time because of the power outage at [facility].  CONCLUSION: 1. Continued patency of the internal mammary to the diagonal and LAD. 2. Continued patency to two branches of the circumflex marginal. 3. Moderate stenosis involving the proximal right coronary artery.  DISPOSITION:  The patient has a variety of symptoms.  These include shortness of breath.  He has had some chest discomfort.  It is not clear that his diabetes is well controlled.  I will review the films with Dr. Excell Seltzer before making a final decision, and we will make arrangements for followup.     Arturo Morton. Riley Kill, MD, [facility]     TDS/MEDQ  D:  11/22/2010  T:  11/22/2010  Job:  [id number]  cc:   CV Laboratory  Electronically Signed by Shawnie Pons MD [facility] on 12/15/2010 05:38:52 AM

## 2010-12-17 LAB — REMOTE ICD DEVICE
ATRIAL PACING ICD: 0 pct
CHARGE TIME: 8.5 s
DEV-0020ICD: NEGATIVE
DEVICE MODEL ICD: 168307
PACEART VT: 0
RV LEAD AMPLITUDE: 16.4 mv
TZAT-0013FASTVT: 2
TZAT-0018FASTVT: NEGATIVE
TZAT-0018FASTVT: NEGATIVE
TZST-0001FASTVT: 3
TZST-0001FASTVT: 4
TZST-0001FASTVT: 7
TZST-0001FASTVT: 8
TZST-0003FASTVT: 26 J
TZST-0003FASTVT: 41 J
TZST-0003FASTVT: 41 J
VENTRICULAR PACING ICD: 0 pct
VF: 0

## 2010-12-19 NOTE — Progress Notes (Signed)
icd remote check  

## 2010-12-21 ENCOUNTER — Encounter: Payer: Self-pay | Admitting: Cardiovascular Disease

## 2010-12-21 ENCOUNTER — Ambulatory Visit (INDEPENDENT_AMBULATORY_CARE_PROVIDER_SITE_OTHER): Payer: Self-pay | Admitting: Cardiovascular Disease

## 2010-12-21 DIAGNOSIS — I2510 Atherosclerotic heart disease of native coronary artery without angina pectoris: Secondary | ICD-10-CM

## 2010-12-21 DIAGNOSIS — I10 Essential (primary) hypertension: Secondary | ICD-10-CM

## 2010-12-21 DIAGNOSIS — E785 Hyperlipidemia, unspecified: Secondary | ICD-10-CM

## 2010-12-21 MED ORDER — HYDROCHLOROTHIAZIDE 25 MG PO TABS: 25.0000 mg | ORAL_TABLET | Freq: Every day | ORAL | Status: DC

## 2010-12-21 NOTE — Assessment & Plan Note (Signed)
Blood pressure control is suboptimal. He is taking lisinopril and metoprolol. Recommend add hydrochlorothiazide 25 mg daily and followup metabolic panel in 2 weeks.

## 2010-12-21 NOTE — Assessment & Plan Note (Signed)
The patient's bypass grafts are patent. He has moderate RCA stenosis which can be managed medically at this time. I do not think his symptoms are related to ischemia or obstructive coronary disease.

## 2010-12-21 NOTE — Patient Instructions (Signed)
Your physician wants you to follow-up in: 4 MONTHS.  You will receive a reminder letter in the mail two months in advance. If you don't receive a letter, please call our office to schedule the follow-up appointment.  Your physician recommends that you return for lab work in: 2 WEEKS (BMP 401.1, 414.01)  Your physician has recommended you make the following change in your medication: START HCTZ 25mg  one by mouth daily

## 2010-12-21 NOTE — Assessment & Plan Note (Signed)
Lipids have been at goal with last LDL 87.

## 2010-12-21 NOTE — Progress Notes (Signed)
HPI:  This is a 45 year old gentleman presenting for followup evaluation. He has coronary artery disease and underwent multivessel coronary bypass surgery in June 2011. He was treated with a LIMA to LAD and diagonal, and a vein graft to the obtuse marginal system. He did not have hemodynamically significant stenoses in the right coronary artery so this vessel was not bypassed. He presented recently with recurrent chest pain and shortness of breath. He underwent repeat cardiac catheterization demonstrating patency of his bypass grafts. There was moderate stenosis noted in the right coronary artery. Otherwise there is no significant change in his coronary anatomy.  He continues to feel poorly. He complains of decreased energy and fatigue, exercise intolerance, shortness of breath, and lightheadedness. He has not had recurrent chest pain. He complains of chronic edema of the left leg ever since his bypass. He denies orthopnea or PND. He has occasional resting palpitations. His work schedule has been erratic as he has shifted between daytime work at nighttime work.  Outpatient Encounter Prescriptions as of 12/21/2010  Medication Sig Dispense Refill  . amiodarone (PACERONE) 200 MG tablet Take 1/2 tablet once a day      . aspirin 325 MG tablet Take 325 mg by mouth daily.        Marland Kitchen b complex vitamins tablet Take 1 tablet by mouth daily.        . Chromium Picolinate 200 MCG CAPS Take 1 capsule by mouth daily.        . Cinnamon 500 MG TABS Take 2 tablets by mouth daily.        Marland Kitchen glipiZIDE (GLUCOTROL) 10 MG tablet Take 10 mg by mouth 2 (two) times daily before a meal.        . insulin glargine (LANTUS) 100 UNIT/ML injection Inject 30 Units into the skin at bedtime.        Marland Kitchen lisinopril (PRINIVIL,ZESTRIL) 20 MG tablet Take 1 tablet (20 mg total) by mouth daily.  30 tablet  11  . metoprolol tartrate (LOPRESSOR) 25 MG tablet Take 0.5 tablets (12.5 mg total) by mouth 2 (two) times daily.  30 tablet  6  . Multiple  Vitamin (MULTIVITAMIN) tablet Take 1 tablet by mouth daily.        . nitroGLYCERIN (NITROSTAT) 0.4 MG SL tablet Place 1 tablet (0.4 mg total) under the tongue every 5 (five) minutes as needed for chest pain.  25 tablet  11  . Omega-3 Fatty Acids (FISH OIL BURP-LESS PO) Take 1 tablet by mouth daily.        . rosuvastatin (CRESTOR) 10 MG tablet Take 10 mg by mouth daily.         Facility-Administered Encounter Medications as of 12/21/2010  Medication Dose Route Frequency Provider Last Rate Last Dose  . insulin aspart (novoLOG) injection 3 Units  3 Units Subcutaneous Once Syliva Overman, MD        No Known Allergies  Past Medical History  Diagnosis Date  . Cardiac arrest - ventricular fibrillation 08/26/2009  . CAD (coronary artery disease)     a. OOH MI 6/11; presented with acute CHF; hosp course c/b VF arrest with VDRF, etc;   b s/p CABG: L-LAD/Dx, S-OM/dCFX  . Ischemic cardiomyopathy     echo 10/11:  inf-septal and apical HK, EF 35%, mod LAE  . CKD (chronic kidney disease)   . Sleep apnea   . Diabetic coma with ketoacidosis     Occured in 1994 where he spent 30 days in a coma .  He develpoed infection of his leg and developing pancreatitis during that  time   . DM2 (diabetes mellitus, type 2)   . Cellulitis 2012    spider bite  . HTN (hypertension)   . HLD (hyperlipidemia)   . Chronic systolic heart failure     ROS: Negative except as per HPI  BP 160/89  Pulse 70  Ht 5\' 9"  (1.753 m)  Wt 265 lb 6.4 oz (120.385 kg)  BMI 39.19 kg/m2  PHYSICAL EXAM: Pt is alert and oriented, morbidly obese male in NAD HEENT: normal Neck: JVP - normal, carotids 2+= without bruits Lungs: CTA bilaterally CV: RRR without murmur or gallop Abd: soft, NT, Positive BS, no hepatomegaly Ext: trace edema on the right lower extremity, 1+ edema on the left. Skin: warm/dry no rash  ASSESSMENT AND PLAN:

## 2011-01-04 ENCOUNTER — Ambulatory Visit (INDEPENDENT_AMBULATORY_CARE_PROVIDER_SITE_OTHER): Payer: Self-pay | Admitting: *Deleted

## 2011-01-04 DIAGNOSIS — I10 Essential (primary) hypertension: Secondary | ICD-10-CM

## 2011-01-04 LAB — BASIC METABOLIC PANEL
BUN: 31 mg/dL — ABNORMAL HIGH (ref 6–23)
CO2: 25 mEq/L (ref 19–32)
Calcium: 9 mg/dL (ref 8.4–10.5)
GFR: 55.02 mL/min — ABNORMAL LOW (ref >60.00)
Glucose, Bld: 486 mg/dL — ABNORMAL HIGH (ref 70–99)

## 2011-01-23 ENCOUNTER — Telehealth: Payer: Self-pay | Admitting: Family Medicine

## 2011-01-23 NOTE — Telephone Encounter (Signed)
Feels bad, has all weekend. Coughing and bringing up whitish yellow phlegm. Eyes burning and has low grade temp. Persistent deep cough and no energy. Advised urgent care

## 2011-01-25 ENCOUNTER — Other Ambulatory Visit: Payer: Self-pay | Admitting: Family Medicine

## 2011-02-13 ENCOUNTER — Ambulatory Visit (INDEPENDENT_AMBULATORY_CARE_PROVIDER_SITE_OTHER): Payer: Self-pay | Admitting: *Deleted

## 2011-02-13 ENCOUNTER — Telehealth: Payer: Self-pay | Admitting: Family Medicine

## 2011-02-13 DIAGNOSIS — I10 Essential (primary) hypertension: Secondary | ICD-10-CM

## 2011-02-13 DIAGNOSIS — R05 Cough: Secondary | ICD-10-CM

## 2011-02-13 LAB — BASIC METABOLIC PANEL
BUN: 28 mg/dL — ABNORMAL HIGH (ref 6–23)
Calcium: 9.1 mg/dL (ref 8.4–10.5)
Chloride: 103 mEq/L (ref 96–112)
Creatinine, Ser: 1.4 mg/dL (ref 0.4–1.5)

## 2011-02-15 ENCOUNTER — Ambulatory Visit (HOSPITAL_COMMUNITY)
Admission: RE | Admit: 2011-02-15 | Discharge: 2011-02-15 | Disposition: A | Discharge status: Home / Self Care | Payer: Self-pay | Source: Ambulatory Visit | Attending: Family Medicine | Admitting: Family Medicine

## 2011-02-15 DIAGNOSIS — R059 Cough, unspecified: Secondary | ICD-10-CM | POA: Insufficient documentation

## 2011-02-15 DIAGNOSIS — R05 Cough: Secondary | ICD-10-CM

## 2011-02-15 NOTE — Telephone Encounter (Signed)
Called pt left message .

## 2011-02-15 NOTE — Telephone Encounter (Signed)
Patient aware. Order sent 

## 2011-02-15 NOTE — Telephone Encounter (Signed)
Cough not quite as frequent. Still a deep cough, still able to hear congestion in chest, more of a off white color. Some bodyaches, no fever. Went to urgent care 3 weeks ago (been going on since thanksgiving) gave zpak and that did not take care of it.

## 2011-02-15 NOTE — Telephone Encounter (Signed)
Called patient left message

## 2011-02-15 NOTE — Telephone Encounter (Signed)
I recommend a CXR if he agrees pls order

## 2011-02-17 ENCOUNTER — Other Ambulatory Visit: Payer: Self-pay

## 2011-02-17 ENCOUNTER — Other Ambulatory Visit: Payer: Self-pay | Admitting: Family Medicine

## 2011-02-17 DIAGNOSIS — R05 Cough: Secondary | ICD-10-CM

## 2011-02-17 MED ORDER — CHLORPHENIRAMINE-HYDROCODONE 8-10 MG/5ML PO LQCR: 5.0000 mL | Freq: Two times a day (BID) | ORAL | Status: DC | PRN

## 2011-02-17 NOTE — Progress Notes (Signed)
Sent in and left message with patient

## 2011-02-20 ENCOUNTER — Telehealth: Payer: Self-pay | Admitting: Cardiovascular Disease

## 2011-02-20 NOTE — Telephone Encounter (Signed)
New problem:  Test results.  

## 2011-02-20 NOTE — Telephone Encounter (Signed)
Notified pt of lab results.  Waymon Budge, LPN

## 2011-02-23 ENCOUNTER — Telehealth: Payer: Self-pay

## 2011-02-23 NOTE — Telephone Encounter (Signed)
Called in to [place]. Left message making patient aware

## 2011-02-23 NOTE — Telephone Encounter (Signed)
pls send in apothecary 8 ounces, no refills 1 teasp every 8 hrs as needed

## 2011-02-23 NOTE — Telephone Encounter (Signed)
Still coughing and couldn't afford the tussionex. Wants to know if we can send in something cheaper, like apothecary syrup

## 2011-02-27 ENCOUNTER — Encounter: Payer: Self-pay | Admitting: Family Medicine

## 2011-03-06 ENCOUNTER — Encounter: Payer: Self-pay | Admitting: Family Medicine

## 2011-03-10 ENCOUNTER — Encounter: Payer: Self-pay | Admitting: Family Medicine

## 2011-03-10 ENCOUNTER — Encounter: Payer: Self-pay | Admitting: Internal Medicine

## 2011-03-13 ENCOUNTER — Ambulatory Visit (INDEPENDENT_AMBULATORY_CARE_PROVIDER_SITE_OTHER): Payer: Self-pay | Admitting: Family Medicine

## 2011-03-13 VITALS — BP 132/76 | HR 76 | Resp 18

## 2011-03-13 DIAGNOSIS — I2510 Atherosclerotic heart disease of native coronary artery without angina pectoris: Secondary | ICD-10-CM

## 2011-03-13 DIAGNOSIS — I10 Essential (primary) hypertension: Secondary | ICD-10-CM

## 2011-03-13 DIAGNOSIS — J40 Bronchitis, not specified as acute or chronic: Secondary | ICD-10-CM

## 2011-03-13 DIAGNOSIS — E119 Type 2 diabetes mellitus without complications: Secondary | ICD-10-CM

## 2011-03-13 MED ORDER — METOPROLOL TARTRATE 25 MG PO TABS: 25.0000 mg | ORAL_TABLET | Freq: Two times a day (BID) | ORAL | Status: DC

## 2011-03-13 MED ORDER — DOXYCYCLINE HYCLATE 100 MG PO TABS: 100.0000 mg | ORAL_TABLET | Freq: Two times a day (BID) | ORAL | Status: AC

## 2011-03-13 MED ORDER — BENZONATATE 100 MG PO CAPS: 100.0000 mg | ORAL_CAPSULE | Freq: Four times a day (QID) | ORAL | Status: DC | PRN

## 2011-03-13 NOTE — Patient Instructions (Signed)
F/u in 3 months.  Please get HBa1C as soon as possible.  Decongestants and antibiotic, doxycycline have been prescribed for your cough.   STOP lisinopril, I think this is contributing to the cough.  Dose increase on metoprolol to 25 mg one twice daily. Do not increase dose if you are taking pacerone, and call back and let me know   Increase lantus to 60 units daily.  Check blood sugars at least twice daily until they are controlled please, log book is provided.  All the best for 2013!

## 2011-03-14 MED ORDER — HYDROCHLOROTHIAZIDE 25 MG PO TABS: 25.0000 mg | ORAL_TABLET | Freq: Every day | ORAL | Status: DC

## 2011-03-14 MED ORDER — GLIPIZIDE 10 MG PO TABS: 10.0000 mg | ORAL_TABLET | Freq: Two times a day (BID) | ORAL | Status: DC

## 2011-03-14 MED ORDER — ROSUVASTATIN CALCIUM 10 MG PO TABS: 10.0000 mg | ORAL_TABLET | Freq: Every day | ORAL | Status: DC

## 2011-03-26 NOTE — Assessment & Plan Note (Addendum)
Uncontrolled non compliant with diet, not testing, currently financially challenged, but the situation is improving

## 2011-03-27 ENCOUNTER — Encounter: Payer: Self-pay | Admitting: Family Medicine

## 2011-03-27 NOTE — Progress Notes (Signed)
  Subjective:    Patient ID: Matthew [name], male    DOB: 01/02/1966, 46 y.o.   MRN: [id number]  HPI The PT is here for follow up and re-evaluation of chronic medical conditions, medication management and review of any available recent lab and radiology data.  Preventive health is updated, specifically  Cancer screening and Immunization.   Questions or concerns regarding consultations or procedures which the PT has had in the interim are  addressed.  C/o chronic cough which is dry and tickle in back of throat, no fever or chills, at times he produces dark green sputum. Still not testing blood sugars regulalrly, short on supplies, eating habits are still undesirable also, he plans to work on both   Review of Systems See HPI Denies recent fever or chills. Denies sinus pressure, nasal congestion, ear pain or sore throat.  Intermittent chest pains, recently had negative cardiac eval for recurrent disease Denies abdominal pain, nausea, vomiting,diarrhea or constipation.   Denies dysuria, frequency, hesitancy or incontinence. Denies joint pain, swelling and limitation in mobility. Denies headaches, seizures, numbness, or tingling. Denies depression, anxiety or insomnia. Denies skin break down or rash.        Objective:   Physical Exam Patient alert and oriented and in no cardiopulmonary distress.  HEENT: No facial asymmetry, EOMI, no sinus tenderness,  oropharynx pink and moist.  Neck supple no adenopathy.  Chest: Decreased air entry in bases with few crackles CVS: S1, S2 no murmurs, no S3.  ABD: Soft non tender. Bowel sounds normal.  Ext: No edema  MS: Adequate ROM spine, shoulders, hips and knees.  Skin: Intact, no ulcerations or rash noted.  Psych: Good eye contact, normal affect. Memory intact not anxious or depressed appearing.  CNS: CN 2-12 intact, power, tone and sensation normal throughout.        Assessment & Plan:

## 2011-04-11 ENCOUNTER — Ambulatory Visit (INDEPENDENT_AMBULATORY_CARE_PROVIDER_SITE_OTHER): Payer: 59 | Admitting: *Deleted

## 2011-04-11 ENCOUNTER — Encounter: Payer: Self-pay | Admitting: Internal Medicine

## 2011-04-11 DIAGNOSIS — I472 Ventricular tachycardia, unspecified: Secondary | ICD-10-CM

## 2011-04-11 DIAGNOSIS — Z95810 Presence of automatic (implantable) cardiac defibrillator: Secondary | ICD-10-CM

## 2011-04-20 NOTE — Progress Notes (Signed)
Remote icd check  

## 2011-05-03 ENCOUNTER — Encounter: Payer: Self-pay | Admitting: *Deleted

## 2011-05-11 ENCOUNTER — Encounter: Payer: Self-pay | Admitting: Family Medicine

## 2011-05-11 ENCOUNTER — Ambulatory Visit (HOSPITAL_COMMUNITY)
Admission: RE | Admit: 2011-05-11 | Discharge: 2011-05-11 | Disposition: A | Discharge status: Home / Self Care | Payer: 59 | Source: Ambulatory Visit | Attending: Family Medicine | Admitting: Family Medicine

## 2011-05-11 ENCOUNTER — Ambulatory Visit (INDEPENDENT_AMBULATORY_CARE_PROVIDER_SITE_OTHER): Payer: 59 | Admitting: Family Medicine

## 2011-05-11 VITALS — BP 130/74 | HR 81 | Resp 18 | Ht 69.0 in | Wt 267.1 lb

## 2011-05-11 DIAGNOSIS — F329 Major depressive disorder, single episode, unspecified: Secondary | ICD-10-CM

## 2011-05-11 DIAGNOSIS — J0100 Acute maxillary sinusitis, unspecified: Secondary | ICD-10-CM

## 2011-05-11 DIAGNOSIS — J40 Bronchitis, not specified as acute or chronic: Secondary | ICD-10-CM | POA: Insufficient documentation

## 2011-05-11 DIAGNOSIS — E119 Type 2 diabetes mellitus without complications: Secondary | ICD-10-CM

## 2011-05-11 DIAGNOSIS — E669 Obesity, unspecified: Secondary | ICD-10-CM

## 2011-05-11 DIAGNOSIS — E785 Hyperlipidemia, unspecified: Secondary | ICD-10-CM

## 2011-05-11 DIAGNOSIS — I10 Essential (primary) hypertension: Secondary | ICD-10-CM

## 2011-05-11 MED ORDER — CLARITHROMYCIN 500 MG PO TABS: 500.0000 mg | ORAL_TABLET | Freq: Two times a day (BID) | ORAL | Status: AC

## 2011-05-11 MED ORDER — BENZONATATE 100 MG PO CAPS: 100.0000 mg | ORAL_CAPSULE | Freq: Four times a day (QID) | ORAL | Status: DC | PRN

## 2011-05-11 MED ORDER — CEFTRIAXONE SODIUM 1 G IJ SOLR
500.0000 mg | Freq: Once | INTRAMUSCULAR | Status: AC
  Administered 2011-05-11: 500 mg via INTRAMUSCULAR

## 2011-05-11 NOTE — Progress Notes (Signed)
  Subjective:    Patient ID: Matthew [name], male    DOB: 1965/06/10, 46 y.o.   MRN: [id number]  HPI  4 day h/o voice loss, fatigue, cough , productive sputum, sore throat,facial pressure ear pressure, difficulty breathing due to nasal congestion, feels as if he will fall backward when  He goes stand, chills, and fever. States co worker advised that her child had recently been diagnosed with whooping cough. Blood sugars remain uncontrolled. Concern voiced about reduced quality and quantity of ejaculate in recent months . Improvement in depression since he has again got improved job stability and health insurance coverage  Review of Systems See HPI Denies chest pains, palpitations and leg swelling Denies abdominal pain, nausea, vomiting,diarrhea or constipation.   Denies dysuria, frequency, hesitancy or incontinence. Denies joint pain, swelling and limitation in mobility. Denies headaches, seizures, numbness, or tingling. Denies uncontrolled  Depression or  anxiety has  Insomnia from excessive cough interfering with sleep Denies skin break down or rash.         Objective:   Physical Exam Patient alert and oriented and in no cardiopulmonary distress.  HEENT: No facial asymmetry, EOMI, left maxillary  sinus tenderness,  oropharynx pink and moist.  Neck supple no adenopathy.  Chest: decreased air entry, scattered crackles, no wheezes  CVS: S1, S2 no murmurs, no S3.  ABD: Soft non tender. Bowel sounds normal.  Ext: No edema  MS: Adequate ROM spine, shoulders, hips and knees.  Skin: Intact, no ulcerations or rash noted.  Psych: Good eye contact, normal affect. Memory intact not anxious or depressed appearing.  CNS: CN 2-12 intact, power, tone and sensation normal throughout.        Assessment & Plan:

## 2011-05-11 NOTE — Patient Instructions (Signed)
F/u in 2 month  You are being treated for sinusitis and bronchitis.  Rocephin 500mg  iM in the office, decongestants and antibiotics are sent to the pharmacy.  CXR today.  cBC and diff, cmp and EGFR, lipid, hBA1C , TSh , PSA today, non fasting  You will get a coupon for crestor and also for the insulin you use  Work excuse from 03/07 to return 05/15/2011  I will check into your specific concerns re need for colonscopy and the other and get back to you

## 2011-05-12 DIAGNOSIS — J0100 Acute maxillary sinusitis, unspecified: Secondary | ICD-10-CM | POA: Insufficient documentation

## 2011-05-12 LAB — CBC WITH DIFFERENTIAL/PLATELET
Basophils Absolute: 0 10*3/uL (ref 0.0–0.1)
Basophils Relative: 1 % (ref 0–1)
Eosinophils Absolute: 0.1 10*3/uL (ref 0.0–0.7)
Eosinophils Relative: 2 % (ref 0–5)
HCT: 40.1 % (ref 39.0–52.0)
Hemoglobin: 13.4 g/dL (ref 13.0–17.0)
Lymphocytes Relative: 33 % (ref 12–46)
Lymphs Abs: 2.4 10*3/uL (ref 0.7–4.0)
MCH: 26.9 pg (ref 26.0–34.0)
MCHC: 33.4 g/dL (ref 30.0–36.0)
MCV: 80.5 fL (ref 78.0–100.0)
Monocytes Absolute: 0.6 10*3/uL (ref 0.1–1.0)
Monocytes Relative: 8 % (ref 3–12)
Neutro Abs: 4.2 10*3/uL (ref 1.7–7.7)
Neutrophils Relative %: 57 % (ref 43–77)
Platelets: 139 10*3/uL — ABNORMAL LOW (ref 150–400)
RBC: 4.98 MIL/uL (ref 4.22–5.81)
RDW: 13.6 % (ref 11.5–15.5)
WBC: 7.3 10*3/uL (ref 4.0–10.5)

## 2011-05-12 LAB — COMPLETE METABOLIC PANEL WITHOUT GFR
ALT: 23 U/L (ref 0–53)
AST: 20 U/L (ref 0–37)
Albumin: 3.9 g/dL (ref 3.5–5.2)
Alkaline Phosphatase: 89 U/L (ref 39–117)
BUN: 24 mg/dL — ABNORMAL HIGH (ref 6–23)
CO2: 26 meq/L (ref 19–32)
Calcium: 9.1 mg/dL (ref 8.4–10.5)
Chloride: 98 meq/L (ref 96–112)
Creat: 1.51 mg/dL — ABNORMAL HIGH (ref 0.50–1.35)
GFR, Est African American: 63 mL/min
GFR, Est Non African American: 54 mL/min — ABNORMAL LOW
Glucose, Bld: 341 mg/dL — ABNORMAL HIGH (ref 70–99)
Potassium: 4.2 meq/L (ref 3.5–5.3)
Sodium: 136 meq/L (ref 135–145)
Total Bilirubin: 0.5 mg/dL (ref 0.3–1.2)
Total Protein: 6.6 g/dL (ref 6.0–8.3)

## 2011-05-12 LAB — LIPID PANEL
Cholesterol: 168 mg/dL (ref 0–200)
HDL: 30 mg/dL — ABNORMAL LOW
LDL Cholesterol: 67 mg/dL (ref 0–99)
Total CHOL/HDL Ratio: 5.6 ratio
Triglycerides: 353 mg/dL — ABNORMAL HIGH
VLDL: 71 mg/dL — ABNORMAL HIGH (ref 0–40)

## 2011-05-12 LAB — HEMOGLOBIN A1C
Hgb A1c MFr Bld: 12.9 % — ABNORMAL HIGH
Mean Plasma Glucose: 324 mg/dL — ABNORMAL HIGH

## 2011-05-12 LAB — TSH: TSH: 1.219 u[IU]/mL (ref 0.350–4.500)

## 2011-05-12 NOTE — Assessment & Plan Note (Signed)
Uncontrolled IDDM deteriorating, complicated by nephropathy and  CAD refer to endo for management

## 2011-05-12 NOTE — Assessment & Plan Note (Signed)
Persistently elevated triglycerides, low fat diet discussed and encouraged. Continue crestor

## 2011-05-12 NOTE — Assessment & Plan Note (Signed)
Acute sinus infection, antibiotic prescribed

## 2011-05-12 NOTE — Progress Notes (Signed)
Addended by: Kandis Fantasia B on: 05/12/2011 02:56 PM   Modules accepted: Orders

## 2011-05-12 NOTE — Assessment & Plan Note (Signed)
Controlled, no change in medication  

## 2011-05-12 NOTE — Assessment & Plan Note (Signed)
Acute bronchitis, h/o recent second hand exposure to pertussis, will prescribe biaxin

## 2011-05-12 NOTE — Assessment & Plan Note (Signed)
Improved, work situation has improved

## 2011-05-31 ENCOUNTER — Encounter: Payer: Self-pay | Admitting: Internal Medicine

## 2011-06-07 ENCOUNTER — Encounter: Payer: Self-pay | Admitting: Internal Medicine

## 2011-07-11 ENCOUNTER — Ambulatory Visit (INDEPENDENT_AMBULATORY_CARE_PROVIDER_SITE_OTHER): Payer: 59 | Admitting: Family Medicine

## 2011-07-11 ENCOUNTER — Encounter: Payer: Self-pay | Admitting: Physician Assistant

## 2011-07-11 ENCOUNTER — Encounter: Payer: Self-pay | Admitting: Family Medicine

## 2011-07-11 VITALS — BP 140/90 | HR 83 | Resp 16 | Ht 69.0 in | Wt 272.0 lb

## 2011-07-11 DIAGNOSIS — F329 Major depressive disorder, single episode, unspecified: Secondary | ICD-10-CM

## 2011-07-11 DIAGNOSIS — I2510 Atherosclerotic heart disease of native coronary artery without angina pectoris: Secondary | ICD-10-CM

## 2011-07-11 DIAGNOSIS — E785 Hyperlipidemia, unspecified: Secondary | ICD-10-CM

## 2011-07-11 DIAGNOSIS — R079 Chest pain, unspecified: Secondary | ICD-10-CM

## 2011-07-11 DIAGNOSIS — E1065 Type 1 diabetes mellitus with hyperglycemia: Secondary | ICD-10-CM

## 2011-07-11 DIAGNOSIS — I10 Essential (primary) hypertension: Secondary | ICD-10-CM

## 2011-07-11 MED ORDER — VENLAFAXINE HCL ER 37.5 MG PO CP24: 37.5000 mg | ORAL_CAPSULE | Freq: Every day | ORAL | Status: DC

## 2011-07-11 NOTE — Progress Notes (Signed)
  Subjective:    Patient ID: Matthew [name], male    DOB: 22-Feb-1966, 46 y.o.   MRN: [id number]  HPI Pt in with primary c/o recurrent substernal and left chest pain on 2 separate occasions this past weekend requiring NTG for relief. He has anxiety about this as he has established CAD and has had CABG. C/o increased and uncontrolled stress and anxiety on the job, poor concentration, feels overwhelmed and anxious and has increased chest pain while on the job also, this has worsened in the past 2 to 4 weeks Reports fluctuation in his sugars, improve however, but has also had episodes when it dropped below 70, still working with consitent eating   Review of Systems See HPI Denies recent fever or chills. Denies sinus pressure, nasal congestion, ear pain or sore throat. Denies chest congestion, productive cough or wheezing. Denies abdominal pain, nausea, vomiting,diarrhea or constipation.   Denies dysuria, frequency, hesitancy or incontinence. Denies joint pain, swelling and limitation in mobility. Denies headaches, seizures, numbness, or tingling.  Denies skin break down or rash.        Objective:   Physical Exam Patient alert and oriented and in no cardiopulmonary distress.  HEENT: No facial asymmetry, EOMI, no sinus tenderness,  oropharynx pink and moist.  Neck supple no adenopathy.  Chest: Clear to auscultation bilaterally.No reproducible chest wall pain  CVS: S1, S2 no murmurs, no S3. EKG in office: NSR, no acute ischemia  ABD: Soft non tender. Bowel sounds normal.  Ext: No edema  MS: Adequate ROM spine, shoulders, hips and knees.  Skin: Intact, no ulcerations or rash noted.  Psych: Good eye contact, normal affect. Memory intact  anxious and  depressed appearing.  CNS: CN 2-12 intact, power, tone and sensation normal throughout.        Assessment & Plan:

## 2011-07-11 NOTE — Assessment & Plan Note (Addendum)
Substernal; burning recurrent , high CAD risk

## 2011-07-11 NOTE — Patient Instructions (Addendum)
F/U in 2 months    I am happy that you are seeing the endocrinologist  About your diabetes, this wil help  If you have severe chest pain, that does not respond to nitroglycerin you need to go to the emergency dept.  You will be out on medical leave from May 7 to return May 13,2013.  Medication is sent in for anxiety  You are being referred for cardiology re eval on an urgent basis, since you have had severe chest pain on 2 occasions in the past 4 days, last Friday and Monday night when you needed NTG

## 2011-07-13 ENCOUNTER — Encounter: Payer: Self-pay | Admitting: Physician Assistant

## 2011-07-13 ENCOUNTER — Ambulatory Visit (INDEPENDENT_AMBULATORY_CARE_PROVIDER_SITE_OTHER): Payer: 59 | Admitting: Physician Assistant

## 2011-07-13 VITALS — BP 136/78 | HR 83 | Ht 69.0 in | Wt 271.0 lb

## 2011-07-13 DIAGNOSIS — F419 Anxiety disorder, unspecified: Secondary | ICD-10-CM

## 2011-07-13 DIAGNOSIS — I2510 Atherosclerotic heart disease of native coronary artery without angina pectoris: Secondary | ICD-10-CM

## 2011-07-13 DIAGNOSIS — R079 Chest pain, unspecified: Secondary | ICD-10-CM

## 2011-07-13 DIAGNOSIS — I2589 Other forms of chronic ischemic heart disease: Secondary | ICD-10-CM

## 2011-07-13 DIAGNOSIS — F411 Generalized anxiety disorder: Secondary | ICD-10-CM

## 2011-07-13 DIAGNOSIS — I255 Ischemic cardiomyopathy: Secondary | ICD-10-CM

## 2011-07-13 DIAGNOSIS — I10 Essential (primary) hypertension: Secondary | ICD-10-CM

## 2011-07-13 LAB — BASIC METABOLIC PANEL
BUN: 19 mg/dL (ref 6–23)
CO2: 28 mEq/L (ref 19–32)
Chloride: 102 mEq/L (ref 96–112)
Creatinine, Ser: 1.3 mg/dL (ref 0.4–1.5)
Glucose, Bld: 249 mg/dL — ABNORMAL HIGH (ref 70–99)

## 2011-07-13 LAB — CBC WITH DIFFERENTIAL/PLATELET
Eosinophils Relative: 0.9 % (ref 0.0–5.0)
Lymphocytes Relative: 18.6 % (ref 12.0–46.0)
Lymphs Abs: 1.8 10*3/uL (ref 0.7–4.0)
MCHC: 32.9 g/dL (ref 30.0–36.0)
MCV: 82.4 fl (ref 78.0–100.0)
Monocytes Relative: 6.4 % (ref 3.0–12.0)
Neutro Abs: 7.1 10*3/uL (ref 1.4–7.7)
Platelets: 143 10*3/uL — ABNORMAL LOW (ref 150.0–400.0)
RBC: 5 Mil/uL (ref 4.22–5.81)

## 2011-07-13 LAB — TROPONIN I: Troponin I: <0.3 ng/mL (ref <0.30)

## 2011-07-13 MED ORDER — FAMOTIDINE 20 MG PO TABS: ORAL_TABLET | ORAL | Status: DC

## 2011-07-13 NOTE — Patient Instructions (Signed)
Your physician recommends that you schedule a follow-up appointment in: 2 WEEKS WITH DR. Excell Seltzer  Your physician has recommended you make the following change in your medication: START IMDUR 30 MG DAILY; START PEPCID 20 MG TAKE 1 TABLET TWICE DAILY X 3 WEEKS THEN TAKE ONLY AS NEEDED FOR CHEST DISCOMFORT AND HEARTBURN  Your physician recommends that you return for lab work in: BMET, CBC W/DIFF, AND STAT TROPONIN I  NO OTHER CHANGES WERE MADE TODAY

## 2011-07-13 NOTE — Progress Notes (Signed)
[address] [place], [place]  [address] Phone: [phone] Fax:  [phone]  Date:  07/13/2011   Name:  Matthew [name]   DOB:  03-05-1966   MRN:  [id number]  PCP:  Syliva Overman, MD, MD  Primary Cardiologist:  Dr. Tonny Bollman  Primary Electrophysiologist:  Dr. Lewayne Bunting    History of Present Illness: Matthew [name] is a 46 y.o. male who returns for evaluation of chest pain.    He has a history of CAD, s/p OOH MI 6/11. At that time he presented with post MI systolic CHF. He had 3 vessel CAD on cardiac catheterization. While awaiting CABG, he suffered a VF arrest that resulted in VDRF and multiorgan system failure.  After recovery underwent CABG (L-LAD+Dx, S-OM+dCFX). He is status post dual-chamber ICD.  Other PMHx: DM2, hyperlipidemia, HTN and OSA.   Last echo 10/11: Inferoseptal and apical hypokinesis, EF 35%, moderate left atrial enlargement.   Presented in 10/2010 with CP and dyspnea.  Nuclear scan 8/12:  Inferior and inf-lat scar with minimal peri-infarct ischemia, EF 53%. Set up for cardiac cath.  LHC 9/12:  LAD 90%, pD1 (small) 70%, prox large Dx 95%, L-LAD and Dx ok, CFX occluded, S-OM and CFX ok, RCA prox to prox/mid 60-70%.  Medical management was recommended by Dr. Tonny Bollman.  Last seen in 12/2010 with plans for 4 mos follow up.     Saw PCP 2 days ago and noted CP.  Referred back for follow up.  States that he had pain in his chest last Friday and again on Monday.  His work is somewhat stressful.  He describes his pain as an ache.  He took nitroglycerin x2 with relief.  His symptoms on Monday were somewhat worse.  He had burning that persisted for several hours after taking nitroglycerin.  He did note some nausea and diaphoresis as well as left hand tingling and shortness of breath.  He denies any exertional chest pain.  He denies exertional shortness of breath.  Denies syncope.  Denies orthopnea, PND.  He has some mild pedal edema, left  greater than the right.  No recent travels.  No hemoptysis.  Potassium  Date/Time Value Range Status  05/11/2011  4:28 PM 4.2  3.5-5.3 (mEq/L) Final     Creat  Date/Time Value Range Status  05/11/2011  4:28 PM 1.51* 0.50-1.35 (mg/dL) Final     Creatinine, Ser  Date/Time Value Range Status  02/13/2011  2:52 PM 1.4  0.4-1.5 (mg/dL) Final     ALT  Date/Time Value Range Status  05/11/2011  4:28 PM 23  0-53 (U/L) Final     TSH  Date/Time Value Range Status  05/11/2011  4:28 PM 1.219  0.350-4.500 (uIU/mL) Final     Hemoglobin  Date/Time Value Range Status  05/11/2011  4:28 PM 13.4  13.0-17.0 (g/dL) Final    Past Medical History  Diagnosis Date  . Cardiac arrest - ventricular fibrillation 08/26/2009  . CAD (coronary artery disease)     a. OOH MI 6/11; presented with acute CHF; hosp course c/b VF arrest with VDRF, etc;   b s/p CABG: L-LAD/Dx, S-OM/dCFX;  c.Nuclear scan 8/12:  Inferior and inf-lat scar with minimal peri-infarct ischemia, EF 53%.; d.LHC 9/12:  LAD 90%, pD1 (small) 70%, prox large Dx 95%, L-LAD and Dx ok, CFX occluded, S-OM and CFX ok, RCA prox to prox/mid 60-70%.  Medical management     . Ischemic cardiomyopathy     echo 10/11:  inf-septal and apical HK, EF 35%, mod LAE  . CKD (chronic kidney disease)   . Sleep apnea   . Diabetic coma with ketoacidosis     Occured in 1994 where he spent 30 days in a coma . He develpoed infection of his leg and developing pancreatitis during that  time   . DM2 (diabetes mellitus, type 2)   . Cellulitis 2012    spider bite  . HTN (hypertension)   . HLD (hyperlipidemia)   . Chronic systolic heart failure     Current Outpatient Prescriptions  Medication Sig Dispense Refill  . aspirin 325 MG tablet Take 325 mg by mouth daily.        Marland Kitchen b complex vitamins tablet Take 1 tablet by mouth daily.        . benzonatate (TESSALON PERLES) 100 MG capsule Take 1 capsule (100 mg total) by mouth every 6 (six) hours as needed for cough.  30 capsule  0    . chlorpheniramine-hydrocodone (TUSSIONEX) 8-10 MG/5ML suspension Take 5 mLs by mouth every 12 (twelve) hours as needed for cough.  120 mL  0  . chlorpheniramine-hydrocodone (TUSSIONEX) 8-10 MG/5ML suspension Take 5 mLs by mouth every 12 (twelve) hours as needed for cough.  120 mL  0  . Chromium Picolinate 200 MCG CAPS Take 1 capsule by mouth daily.        . Cinnamon 500 MG TABS Take 2 tablets by mouth daily.        Marland Kitchen glipiZIDE (GLUCOTROL) 10 MG tablet Take 1 tablet (10 mg total) by mouth 2 (two) times daily before a meal.  180 tablet  2  . hydrochlorothiazide (HYDRODIURIL) 25 MG tablet Take 1 tablet (25 mg total) by mouth daily.  90 tablet  3  . insulin glargine (LANTUS SOLOSTAR) 100 UNIT/ML injection Inject 60 Units into the skin at bedtime.  10 mL  12  . metoprolol tartrate (LOPRESSOR) 25 MG tablet Take 1 tablet (25 mg total) by mouth 2 (two) times daily.  60 tablet  11  . Multiple Vitamin (MULTIVITAMIN) tablet Take 1 tablet by mouth daily.        . nitroGLYCERIN (NITROSTAT) 0.4 MG SL tablet Place 1 tablet (0.4 mg total) under the tongue every 5 (five) minutes as needed for chest pain.  25 tablet  11  . Omega-3 Fatty Acids (FISH OIL BURP-LESS PO) Take 1 tablet by mouth daily.        . rosuvastatin (CRESTOR) 10 MG tablet Take 1 tablet (10 mg total) by mouth daily.  90 tablet  3  . venlafaxine XR (EFFEXOR XR) 37.5 MG 24 hr capsule Take 1 capsule (37.5 mg total) by mouth daily.  30 capsule  2   Current Facility-Administered Medications  Medication Dose Route Frequency Provider Last Rate Last Dose  . insulin aspart (novoLOG) injection 3 Units  3 Units Subcutaneous Once Kerri Perches, MD        Allergies: Allergies  Allergen Reactions  . Ace Inhibitors Cough    History  Substance Use Topics  . Smoking status: Never Smoker   . Smokeless tobacco: Current User    Types: Snuff  . Alcohol Use: No     ROS:  Please see the history of present illness.   He does note frequent belching  and water brash symptoms.  Denies melena or hematochezia.  Denies weight loss.  Denies dysphagia or odynophagia.  All other systems reviewed and negative.   PHYSICAL EXAM: VS:  BP 136/78  Pulse 83  Ht 5\' 9"  (1.753 m)  Wt 271 lb (122.925 kg)  BMI 40.02 kg/m2 Well nourished, well developed, in no acute distress HEENT: normal Neck: no JVD Cardiac:  normal S1, S2; RRR; no murmur Lungs:  clear to auscultation bilaterally, no wheezing, rhonchi or rales Abd: soft, nontender, no hepatomegaly Ext: no edema Skin: warm and dry Neuro:  CNs 2-12 intact, no focal abnormalities noted  EKG:  Sinus rhythm, heart rate 83, left axis deviation, nonspecific ST-T wave changes, no change when compared to prior tracing   ASSESSMENT AND PLAN:  1.  Chest pain   -  Somewhat atypical.  Did have prolonged symptoms on Monday.  PCP took out of work due to stress and started Effexor.  Also notes water brash.   -  Check Troponin today.  Send to ED if +   -  May have small vessel disease.  Start Imdur 30 mg QD.  He does not take PDE inhibitors.     -  Start Pepcid 20 mg BID.  Take for 3-4 weeks, then PRN.  Consider referral to GI.     -  Follow up in 2 weeks with Dr. Tonny Bollman   2.  Coronary artery disease   -  Symptoms atypical.   -  Cath in late 2012 with patent grafts and mod RCA disease - for med Rx.   -  Start Imdur 30 mg QD and follow up in 2 weeks.   -  Check Troponin and send to hospital if abnormal.    3.  Anxiety    -  Likely culprit.   -  Agree with SSRI.  4.  Ischemic cardiomyopathy   -  EF improved on nuclear study.   -  No signs of volume overload.  5.  Hypertension    -  Controlled.  Continue current therapy.   6.  VTach s/p AICD   -  Follow up with Dr. Lewayne Bunting as scheduled.     SignedTereso Newcomer, PA-C  11:20 AM 07/13/2011

## 2011-07-14 ENCOUNTER — Telehealth: Payer: Self-pay | Admitting: *Deleted

## 2011-07-14 ENCOUNTER — Telehealth: Payer: Self-pay | Admitting: Internal Medicine

## 2011-07-14 MED ORDER — ISOSORBIDE MONONITRATE ER 30 MG PO TB24: 30.0000 mg | ORAL_TABLET | Freq: Every day | ORAL | Status: DC

## 2011-07-14 NOTE — Telephone Encounter (Signed)
Patient saw Tereso Newcomer yesterday. I will forward to [employer].

## 2011-07-14 NOTE — Telephone Encounter (Signed)
pt notified of lab results and to watch dietary K+ appt w/PA 5/21

## 2011-07-14 NOTE — Telephone Encounter (Signed)
rx sent in today for imdur 30 mg daily.pt aware rx sent in today.

## 2011-07-14 NOTE — Telephone Encounter (Signed)
rx sent in today for imdur 30 mg daily.pt aware rx sent in today. 

## 2011-07-14 NOTE — Telephone Encounter (Signed)
Message copied by Tarri Fuller on Fri Jul 14, 2011 11:53 AM ------      Message from: [place], [place] T      Created: Thu Jul 13, 2011  5:35 PM       Creatinine ok      K+ high normal        -  Decrease dietary K+      Hgb ok      Plt count stable      Tereso Newcomer, PA-C  5:35 PM 07/13/2011

## 2011-07-14 NOTE — Telephone Encounter (Signed)
Please return call to patient at [phone] regarding imdore med that was prescribed 07/13/11 by [facility]. Patient also received new RX for pepcide which has been filled already, but imdore RX ordered at the same time was not.    Verified preferred is correct [facility]

## 2011-07-16 NOTE — Assessment & Plan Note (Signed)
Increased and uncontrolled, pt to start medication, wok excuse for  5 days also. Not suicidal or homicidal, but focus and concentration is poor

## 2011-07-16 NOTE — Assessment & Plan Note (Signed)
Recent inc in nTG use, aggravated by emotional and mental stress, but high risk for ACS, work excuse and cardilogy re eval

## 2011-07-16 NOTE — Assessment & Plan Note (Signed)
Uncotroled, low fat diet encouraged, will consider increasing dose of crestor

## 2011-07-16 NOTE — Assessment & Plan Note (Addendum)
Uncontrolled, no change in medication  

## 2011-07-16 NOTE — Assessment & Plan Note (Signed)
Being treated by endo and reports improvement in blood sugars , and generally feels  better

## 2011-07-19 ENCOUNTER — Encounter: Payer: Self-pay | Admitting: Internal Medicine

## 2011-07-19 ENCOUNTER — Ambulatory Visit (INDEPENDENT_AMBULATORY_CARE_PROVIDER_SITE_OTHER): Payer: 59 | Admitting: Internal Medicine

## 2011-07-19 VITALS — BP 122/78 | HR 92 | Ht 69.0 in | Wt 268.0 lb

## 2011-07-19 DIAGNOSIS — I472 Ventricular tachycardia: Secondary | ICD-10-CM

## 2011-07-19 DIAGNOSIS — I5022 Chronic systolic (congestive) heart failure: Secondary | ICD-10-CM

## 2011-07-19 DIAGNOSIS — Z95810 Presence of automatic (implantable) cardiac defibrillator: Secondary | ICD-10-CM

## 2011-07-19 LAB — ICD DEVICE OBSERVATION
AL AMPLITUDE: 3.1 mv
ATRIAL PACING ICD: 1 pct
HV IMPEDENCE: 57 Ohm
RV LEAD IMPEDENCE ICD: 525 Ohm
TZAT-0013FASTVT: 2
TZAT-0018FASTVT: NEGATIVE
TZST-0001FASTVT: 3
TZST-0001FASTVT: 7
TZST-0001FASTVT: 8
TZST-0003FASTVT: 26 J
TZST-0003FASTVT: 41 J
TZST-0003FASTVT: 41 J
VENTRICULAR PACING ICD: 1 pct

## 2011-07-19 NOTE — Patient Instructions (Signed)
Your physician wants you to follow-up in: December with Dr. Ladona Ridgel. You will receive a reminder letter in the mail two months in advance. If you don't receive a letter, please call our office to schedule the follow-up appointment.  Your physician recommends that you continue on your current medications as directed. Please refer to the Current Medication list given to you today.

## 2011-07-19 NOTE — Assessment & Plan Note (Signed)
His device is working normally. We'll plan recheck in several months. Estimated battery longevity is 10 years.

## 2011-07-19 NOTE — Progress Notes (Signed)
HPI [name] returns today for followup. He is a very pleasant 46 year old man with an ischemic cardiomyopathy, diabetes, a history of pancreatitis, dyslipidemia, and morbid obesity. He is status post prophylactic ICD implantation. The patient denies chest pain or shortness of breath. He has had no ICD shocks. He admits to weight gain and difficulty controlling his diabetes. Allergies  Allergen Reactions  . Ace Inhibitors Cough     Current Outpatient Prescriptions  Medication Sig Dispense Refill  . aspirin 325 MG tablet Take 325 mg by mouth daily.        Marland Kitchen b complex vitamins tablet Take 1 tablet by mouth daily.        . Chromium Picolinate 200 MCG CAPS Take 1 capsule by mouth daily.        . Cinnamon 500 MG TABS Take 2 tablets by mouth daily.        . famotidine (PEPCID) 20 MG tablet Take 1 tablet twice daily x 3 weeks the take only as needed for chest discomfort and heartburn  60 tablet  3  . hydrochlorothiazide (HYDRODIURIL) 25 MG tablet Take 1 tablet (25 mg total) by mouth daily.  90 tablet  3  . insulin glargine (LANTUS SOLOSTAR) 100 UNIT/ML injection Inject 60 Units into the skin at bedtime.  10 mL  12  . insulin lispro (HUMALOG) 100 UNIT/ML injection Inject 20 Units into the skin 3 (three) times daily before meals.      . isosorbide mononitrate (IMDUR) 30 MG 24 hr tablet Take 1 tablet (30 mg total) by mouth daily.  30 tablet  11  . Liraglutide (VICTOZA) 18 MG/3ML SOLN Inject 1.8 mLs into the skin daily.      . metoprolol tartrate (LOPRESSOR) 25 MG tablet Take 1 tablet (25 mg total) by mouth 2 (two) times daily.  60 tablet  11  . Multiple Vitamin (MULTIVITAMIN) tablet Take 1 tablet by mouth daily.        . nitroGLYCERIN (NITROSTAT) 0.4 MG SL tablet Place 1 tablet (0.4 mg total) under the tongue every 5 (five) minutes as needed for chest pain.  25 tablet  11  . Omega-3 Fatty Acids (FISH OIL BURP-LESS PO) Take 1 tablet by mouth daily.        . rosuvastatin (CRESTOR) 10 MG tablet Take 1  tablet (10 mg total) by mouth daily.  90 tablet  3  . venlafaxine XR (EFFEXOR XR) 37.5 MG 24 hr capsule Take 1 capsule (37.5 mg total) by mouth daily.  30 capsule  2     Past Medical History  Diagnosis Date  . Cardiac arrest - ventricular fibrillation 08/26/2009  . CAD (coronary artery disease)     a. OOH MI 6/11; presented with acute CHF; hosp course c/b VF arrest with VDRF, etc;   b s/p CABG: L-LAD/Dx, S-OM/dCFX;  c.Nuclear scan 8/12:  Inferior and inf-lat scar with minimal peri-infarct ischemia, EF 53%.; d.LHC 9/12:  LAD 90%, pD1 (small) 70%, prox large Dx 95%, L-LAD and Dx ok, CFX occluded, S-OM and CFX ok, RCA prox to prox/mid 60-70%.  Medical management     . Ischemic cardiomyopathy     echo 10/11:  inf-septal and apical HK, EF 35%, mod LAE  . CKD (chronic kidney disease)   . Sleep apnea   . Diabetic coma with ketoacidosis     Occured in 1994 where he spent 30 days in a coma . He develpoed infection of his leg and developing pancreatitis during that  time   .  DM2 (diabetes mellitus, type 2)   . Cellulitis 2012    spider bite  . HTN (hypertension)   . HLD (hyperlipidemia)   . Chronic systolic heart failure     ROS:   All systems reviewed and negative except as noted in the HPI.   Past Surgical History  Procedure Date  . Coronary artery bypass grafting x4 june 29,2011    x 4  . Tonsillectomy 1998  . Appendectomy mid 1990  . Status  post  tonsillectomy   . Right sided abdominal cyst removal      Family History  Problem Relation Age of Onset  . Stroke      family history   . Diabetes      family history   . Diabetes Mother   . Hypertension Mother   . Hyperlipidemia Mother   . Stroke Father   . Diabetes Sister   . Diabetes Sister      History   Social History  . Marital Status: Divorced    Spouse Name: N/A    Number of Children: N/A  . Years of Education: N/A   Occupational History  . Not on file.   Social History Main Topics  . Smoking status: Never  Smoker   . Smokeless tobacco: Current User    Types: Snuff  . Alcohol Use: No  . Drug Use: No  . Sexually Active: Not on file   Other Topics Concern  . Not on file   Social History Narrative  . No narrative on file     BP 122/78  Pulse 92  Ht 5\' 9"  (1.753 m)  Wt 268 lb (121.564 kg)  BMI 39.58 kg/m2  Physical Exam:  Well appearing obese, middle-age man NAD HEENT: Unremarkable Neck:  No JVD, no thyromegally Lungs:  Clear with no wheezes, rales, or rhonchi. HEART:  Regular rate rhythm, no murmurs, no rubs, no clicks Abd:  soft, obese, positive bowel sounds, no organomegally, no rebound, no guarding Ext:  2 plus pulses, no edema, no cyanosis, no clubbing Skin:  No rashes no nodules Neuro:  CN II through XII intact, motor grossly intact  DEVICE  Normal device function.  See PaceArt for details.   Assess/Plan:

## 2011-07-19 NOTE — Assessment & Plan Note (Signed)
He has had no sustained ventricular arrhythmias. We'll follow up. No change in medical therapy.

## 2011-07-19 NOTE — Assessment & Plan Note (Signed)
His symptoms are currently well-controlled. His heart failure is class II. He will continue his current medical therapy and maintain a low-sodium diet.

## 2011-07-21 ENCOUNTER — Encounter: Payer: Self-pay | Admitting: Family Medicine

## 2011-07-21 ENCOUNTER — Ambulatory Visit (INDEPENDENT_AMBULATORY_CARE_PROVIDER_SITE_OTHER): Payer: 59 | Admitting: Family Medicine

## 2011-07-21 VITALS — BP 148/90 | HR 82 | Resp 16 | Ht 69.0 in | Wt 269.0 lb

## 2011-07-21 DIAGNOSIS — F329 Major depressive disorder, single episode, unspecified: Secondary | ICD-10-CM

## 2011-07-21 DIAGNOSIS — R079 Chest pain, unspecified: Secondary | ICD-10-CM

## 2011-07-21 DIAGNOSIS — F419 Anxiety disorder, unspecified: Secondary | ICD-10-CM

## 2011-07-21 DIAGNOSIS — F3289 Other specified depressive episodes: Secondary | ICD-10-CM

## 2011-07-21 DIAGNOSIS — F439 Reaction to severe stress, unspecified: Secondary | ICD-10-CM

## 2011-07-21 DIAGNOSIS — F411 Generalized anxiety disorder: Secondary | ICD-10-CM

## 2011-07-21 DIAGNOSIS — Z733 Stress, not elsewhere classified: Secondary | ICD-10-CM

## 2011-07-21 MED ORDER — LORAZEPAM 1 MG PO TABS: 1.0000 mg | ORAL_TABLET | Freq: Two times a day (BID) | ORAL | Status: DC | PRN

## 2011-07-21 NOTE — Progress Notes (Signed)
  Subjective:    Patient ID: Matthew [name], male    DOB: 1966/03/03, 46 y.o.   MRN: [id number]  HPI Patient presents with increased anxiety for the past week. He had 2 episodes of anxiety attacks during his job. Things are very stressful between him and his boss at work. He was started on Effexor about 2 weeks ago. His anxiety attacks causes chest palpitations and difficulty breathing, he states at times this is difficult to tell if it is his anxiety or true cardiac pain because of his previous heart attacks. He feels stressed to the max. His parents had to pick him up from work yesterday after an anxiety attack,He is afraid his work will suffer and then he will lose his job. Treated for anxiety and depression in the past after his heart attack and heart surgery  Review of Systems  - per above  GEN- denies fatigue, fever, weight loss,weakness, recent illness HEENT- denies eye drainage, change in vision, nasal discharge, CVS- denies chest pain, +palpitations RESP- denies SOB, cough, wheeze ABD- denies N/V, change in stools, abd pain Neuro- denies headache, dizziness, syncope, seizure activity          Objective:   Physical Exam GEN- NAD, alert and oriented x3,obese CVS- RRR, no murmur RESP-CTAB EXT- Trace pedal  edema Pulses- Radial, DP- 2+ Psych- stressed appearing, anxious appearing, not depressed, normal speech, good eye contact, no hallucinations, no apparent SI, normal thought process       Assessment & Plan:

## 2011-07-21 NOTE — Patient Instructions (Signed)
Continue effexor Use ativan at bedtime, you can take an extra during the day as needed for anxiety  F/U in 1 week Out of work for 1 week

## 2011-07-23 ENCOUNTER — Encounter: Payer: Self-pay | Admitting: Family Medicine

## 2011-07-23 DIAGNOSIS — F439 Reaction to severe stress, unspecified: Secondary | ICD-10-CM | POA: Insufficient documentation

## 2011-07-23 DIAGNOSIS — F419 Anxiety disorder, unspecified: Secondary | ICD-10-CM | POA: Insufficient documentation

## 2011-07-23 NOTE — Assessment & Plan Note (Addendum)
Ativan to aide in stress and sleep, rtc 1 week for recheck before going back to work  approx 20 minutes spent on couseling

## 2011-07-23 NOTE — Assessment & Plan Note (Signed)
Recently started on effexor, will continue at current dose, add low dose benzo, out of work to [employer]. Pt not harmful to self

## 2011-07-23 NOTE — Assessment & Plan Note (Signed)
His chest pain and palpitations have been in setting of his anxiety, he was seen by his cardiologist a few days ago, with good report

## 2011-07-25 ENCOUNTER — Ambulatory Visit: Payer: 59 | Admitting: Physician Assistant

## 2011-07-25 ENCOUNTER — Ambulatory Visit: Payer: 59 | Admitting: Nurse Practitioner

## 2011-07-28 ENCOUNTER — Encounter: Payer: Self-pay | Admitting: Family Medicine

## 2011-07-28 ENCOUNTER — Ambulatory Visit (INDEPENDENT_AMBULATORY_CARE_PROVIDER_SITE_OTHER): Payer: 59 | Admitting: Family Medicine

## 2011-07-28 VITALS — BP 150/90 | HR 95 | Resp 16 | Ht 69.0 in | Wt 277.4 lb

## 2011-07-28 DIAGNOSIS — Z733 Stress, not elsewhere classified: Secondary | ICD-10-CM

## 2011-07-28 DIAGNOSIS — F419 Anxiety disorder, unspecified: Secondary | ICD-10-CM

## 2011-07-28 DIAGNOSIS — F329 Major depressive disorder, single episode, unspecified: Secondary | ICD-10-CM

## 2011-07-28 DIAGNOSIS — I10 Essential (primary) hypertension: Secondary | ICD-10-CM

## 2011-07-28 DIAGNOSIS — F439 Reaction to severe stress, unspecified: Secondary | ICD-10-CM

## 2011-07-28 DIAGNOSIS — G4700 Insomnia, unspecified: Secondary | ICD-10-CM | POA: Insufficient documentation

## 2011-07-28 DIAGNOSIS — F3289 Other specified depressive episodes: Secondary | ICD-10-CM

## 2011-07-28 DIAGNOSIS — F411 Generalized anxiety disorder: Secondary | ICD-10-CM

## 2011-07-28 MED ORDER — ZOLPIDEM TARTRATE 10 MG PO TABS: 10.0000 mg | ORAL_TABLET | Freq: Every evening | ORAL | Status: DC | PRN

## 2011-07-28 NOTE — Assessment & Plan Note (Signed)
Trial of ambien at bedtime

## 2011-07-28 NOTE — Assessment & Plan Note (Signed)
Unchanged, continue effexor and ativan

## 2011-07-28 NOTE — Assessment & Plan Note (Signed)
Elevated BP today, he is quite anxious about returning to work, no change in meds,

## 2011-07-28 NOTE — Progress Notes (Signed)
  Subjective:    Patient ID: Matthew [name], male    DOB: 05/02/1965, 46 y.o.   MRN: [id number]  HPI  Patient here to follow by last visit. He was seen one week ago and taken out of work secondary to stress and anxiety. He was started on Effexor recently. Ativan was added at her last visit to help calm him down as well as aid in sleep. He still cannot sleep often wakes up in the middle night thinking about his job and things that he may be missing. He is also afraid he may lose his job. He's been trying to call and check in however it appears that he's been getting negative for marks from his boss and another coworker. He still feels very anxious about going to work. He understands that he needs to find a new job because his current situation is not good for his health.   Review of Systems -per above      Objective:   Physical Exam GEN- NAD, alert and oriented x3,obese Psych- stressed appearing, anxious appearing, not depressed, normal speech, good eye contact, no hallucinations, no apparent SI, normal thought process   15 minute spent on counseling      Assessment & Plan:

## 2011-07-28 NOTE — Patient Instructions (Signed)
Try the ambien for sleep  You can use the ativan during the day, try breaking it in half Continue your other medications F/U 2 weeks

## 2011-07-28 NOTE — Assessment & Plan Note (Addendum)
Unchanged, no SI, he will f/u in 2 weeks if no improvement with environment and how he is handling things will discuss counseling

## 2011-07-28 NOTE — Assessment & Plan Note (Signed)
While he did have some time to himself this past week to reflect on things he was still quite stressed and anxious about returning to work and often having nightmares regarding work situations. I advised him I cannot fix his work situation. He wants to go back with work restrictions stating his environment needs to stay calm and low stress. As I cannot find any concrete restrictions regarding stress,  I will send him back with reduced part-time work PE sent back and so this will be a less stressful environment.

## 2011-08-02 ENCOUNTER — Telehealth: Payer: Self-pay | Admitting: Family Medicine

## 2011-08-03 ENCOUNTER — Telehealth: Payer: Self-pay | Admitting: Family Medicine

## 2011-08-03 NOTE — Telephone Encounter (Signed)
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

## 2011-08-07 ENCOUNTER — Ambulatory Visit: Payer: 59 | Admitting: Physician Assistant

## 2011-08-07 ENCOUNTER — Ambulatory Visit: Payer: 59 | Admitting: Nurse Practitioner

## 2011-08-11 ENCOUNTER — Ambulatory Visit (INDEPENDENT_AMBULATORY_CARE_PROVIDER_SITE_OTHER): Payer: 59 | Admitting: Family Medicine

## 2011-08-11 ENCOUNTER — Encounter: Payer: Self-pay | Admitting: Family Medicine

## 2011-08-11 VITALS — BP 144/86 | HR 84 | Resp 18 | Ht 69.0 in | Wt 276.0 lb

## 2011-08-11 DIAGNOSIS — F411 Generalized anxiety disorder: Secondary | ICD-10-CM

## 2011-08-11 DIAGNOSIS — G4700 Insomnia, unspecified: Secondary | ICD-10-CM

## 2011-08-11 DIAGNOSIS — F329 Major depressive disorder, single episode, unspecified: Secondary | ICD-10-CM

## 2011-08-11 DIAGNOSIS — I10 Essential (primary) hypertension: Secondary | ICD-10-CM

## 2011-08-11 DIAGNOSIS — F419 Anxiety disorder, unspecified: Secondary | ICD-10-CM

## 2011-08-11 DIAGNOSIS — F3289 Other specified depressive episodes: Secondary | ICD-10-CM

## 2011-08-11 MED ORDER — LORAZEPAM 1 MG PO TABS: 1.0000 mg | ORAL_TABLET | Freq: Two times a day (BID) | ORAL | Status: DC | PRN

## 2011-08-11 NOTE — Patient Instructions (Signed)
Continue current medications Keep previous appointment in July with Dr.Simpson

## 2011-08-13 ENCOUNTER — Encounter: Payer: Self-pay | Admitting: Family Medicine

## 2011-08-13 NOTE — Assessment & Plan Note (Signed)
BP improved, no change to meds

## 2011-08-13 NOTE — Assessment & Plan Note (Signed)
Doing well with time off, we will have to see what happens when he returns to work,we discussed setting limits and taking mental breaks as needed. He will continue ativan as needed. If he deteriorates in work setting would refer to psych for counseling/therapy

## 2011-08-13 NOTE — Progress Notes (Signed)
  Subjective:    Patient ID: Matthew [name], male    DOB: October 26, 1965, 46 y.o.   MRN: [id number]  HPI  Pt here to f/u anxiety, doing well on effexor, ativan helps him relax he does not need to use very often, Remus Loffler gets him about 5 hours of sleep interrupted. He did not receive letter from last week and decided to stay off another week, he plans to return with full duties on Monday.  He hit his leg a week ago and has a scab, would like me to look at this, no pus from area.  Review of Systems   GEN- denies fatigue, fever, weight loss,weakness, recent illness HEENT- denies eye drainage, change in vision, nasal discharge, CVS- denies chest pain, palpitations RESP- denies SOB, cough, wheeze ABD- denies N/V, change in stools, abd pain Psych-+anxiety,denies depression      Objective:   Physical Exam GEN- NAD, alert and oriented x 3  Skin- RIght skin- dime size scab with mild erythema surrounding, no fluctuance, no drainage, non tender Psych-normal affect, not overly anxious, appears more relaxed today       Assessment & Plan:

## 2011-08-13 NOTE — Assessment & Plan Note (Signed)
Sleep improved continue [place]

## 2011-08-13 NOTE — Assessment & Plan Note (Signed)
Continue effexor, he is doing much better on this currently

## 2011-08-14 NOTE — Telephone Encounter (Signed)
Faxed would not go through gave to father on 6.10.13 to give to son

## 2011-08-22 ENCOUNTER — Ambulatory Visit: Payer: 59 | Admitting: Physician Assistant

## 2011-09-08 ENCOUNTER — Other Ambulatory Visit: Payer: Self-pay | Admitting: Family Medicine

## 2011-09-18 ENCOUNTER — Encounter: Payer: Self-pay | Admitting: Family Medicine

## 2011-09-18 ENCOUNTER — Ambulatory Visit (INDEPENDENT_AMBULATORY_CARE_PROVIDER_SITE_OTHER): Payer: 59 | Admitting: Family Medicine

## 2011-09-18 VITALS — BP 140/90 | HR 90 | Resp 16 | Ht 69.0 in

## 2011-09-18 DIAGNOSIS — I10 Essential (primary) hypertension: Secondary | ICD-10-CM

## 2011-09-18 DIAGNOSIS — F419 Anxiety disorder, unspecified: Secondary | ICD-10-CM

## 2011-09-18 DIAGNOSIS — F411 Generalized anxiety disorder: Secondary | ICD-10-CM

## 2011-09-18 DIAGNOSIS — E1065 Type 1 diabetes mellitus with hyperglycemia: Secondary | ICD-10-CM

## 2011-09-18 DIAGNOSIS — F329 Major depressive disorder, single episode, unspecified: Secondary | ICD-10-CM

## 2011-09-18 DIAGNOSIS — E785 Hyperlipidemia, unspecified: Secondary | ICD-10-CM

## 2011-09-18 DIAGNOSIS — I5022 Chronic systolic (congestive) heart failure: Secondary | ICD-10-CM

## 2011-09-18 MED ORDER — ROSUVASTATIN CALCIUM 10 MG PO TABS: 10.0000 mg | ORAL_TABLET | Freq: Every day | ORAL | Status: DC

## 2011-09-18 MED ORDER — VENLAFAXINE HCL ER 75 MG PO CP24: ORAL_CAPSULE | ORAL | Status: DC

## 2011-09-18 MED ORDER — LOSARTAN POTASSIUM 50 MG PO TABS: 50.0000 mg | ORAL_TABLET | Freq: Every day | ORAL | Status: DC

## 2011-09-18 MED ORDER — BUSPIRONE HCL 5 MG PO TABS: 5.0000 mg | ORAL_TABLET | Freq: Three times a day (TID) | ORAL | Status: DC

## 2011-09-18 MED ORDER — ZOLPIDEM TARTRATE 10 MG PO TABS: 10.0000 mg | ORAL_TABLET | Freq: Every evening | ORAL | Status: DC | PRN

## 2011-09-18 MED ORDER — LORAZEPAM 1 MG PO TABS: 1.0000 mg | ORAL_TABLET | Freq: Two times a day (BID) | ORAL | Status: DC | PRN

## 2011-09-18 NOTE — Patient Instructions (Addendum)
F/u in 8 to 10 weeks.  Dose increase on effexor, also new medication for anxiety , buspar 3 times daily, not habit form. Use ativan sparingly , only if needed for uncontrolled anxiety. Goal is to discontinue the ativan  Blood pressure is elevated, losartan daily for this and renal protection.  It is important that you exercise regularly at least 30 minutes 5 times a week. If you develop chest pain, have severe difficulty breathing, or feel very tired, stop exercising immediately and seek medical attention   A healthy diet is rich in fruit, vegetables and whole grains. Poultry fish, nuts and beans are a healthy choice for protein rather then red meat. A low sodium diet and drinking 64 ounces of water daily is generally recommended. Oils and sweet should be limited. Carbohydrates especially for those who are diabetic or overweight, should be limited to 34-45 gram per meal. It is important to eat on a regular schedule, at least 3 times daily. Snacks should be primarily fruits, vegetables or nuts.  Crestor samples are provided

## 2011-09-22 NOTE — Assessment & Plan Note (Addendum)
Elevated at this visit, losartan is added for improved control

## 2011-09-22 NOTE — Assessment & Plan Note (Signed)
Uncontrolled and somewhat disabling, start buspar

## 2011-09-22 NOTE — Assessment & Plan Note (Signed)
Improved on medication , but still has difficulty dealing with the stress of life, will increase medication dose

## 2011-09-22 NOTE — Assessment & Plan Note (Signed)
Reports marked fluctuations in blood sugar still, he is currently followed by endo

## 2011-09-25 NOTE — Progress Notes (Signed)
  Subjective:    Patient ID: Matthew [name], male    DOB: 1965-12-31, 46 y.o.   MRN: [id number]  HPI Pt is in for f/u of his chronic medical conditions. He reports improvement in his blood sugars now that he is in the care of an endocrinologist, but still finds it challenging to be diligent with both diet and blood sugar testing as required. He has no complaint today of chest pain or excessive fatigue. He still is experiencing uncontrolled anxiety and depression issues. He denies suicidal or homicidal thought but feel "on edge" and is easily stressed on the job in particular , but overall the challenges of life are still overwhelming.   Review of Systems See HPI Denies recent fever or chills. Denies sinus pressure, nasal congestion, ear pain or sore throat. Denies chest congestion, productive cough or wheezing. Denies chest pains, palpitations and leg swelling Denies abdominal pain, nausea, vomiting,diarrhea or constipation.   Denies dysuria, frequency, hesitancy or incontinence. Denies joint pain, swelling and limitation in mobility. Denies headaches, seizures, numbness, or tingling. . Denies skin break down or rash.        Objective:   Physical Exam Patient alert and oriented and in no cardiopulmonary distress.  HEENT: No facial asymmetry, EOMI, no sinus tenderness,  oropharynx pink and moist.  Neck supple no adenopathy.  Chest: Clear to auscultation bilaterally.  CVS: S1, S2 no murmurs, no S3.  ABD: Soft non tender. Bowel sounds normal.  Ext: No edema  MS: Adequate ROM spine, shoulders, hips and knees.  Skin: Intact, no ulcerations or rash noted.  Psych: Good eye contact, normal affect. Memory intact mildly  anxious and  depressed appearing.  CNS: CN 2-12 intact, power, tone and sensation normal throughout.        Assessment & Plan:

## 2011-09-25 NOTE — Assessment & Plan Note (Signed)
Stable at this time 

## 2011-09-25 NOTE — Assessment & Plan Note (Signed)
Elevated triglycerides, low fat diet discussed and encouraged, crestor samples provided

## 2011-09-27 ENCOUNTER — Other Ambulatory Visit (HOSPITAL_COMMUNITY): Payer: Self-pay | Admitting: "Endocrinology

## 2011-09-27 DIAGNOSIS — E0590 Thyrotoxicosis, unspecified without thyrotoxic crisis or storm: Secondary | ICD-10-CM

## 2011-09-28 ENCOUNTER — Ambulatory Visit (HOSPITAL_COMMUNITY): Payer: 59

## 2011-09-29 ENCOUNTER — Other Ambulatory Visit (HOSPITAL_COMMUNITY): Payer: 59

## 2011-10-02 ENCOUNTER — Encounter (HOSPITAL_COMMUNITY)
Admission: RE | Admit: 2011-10-02 | Discharge: 2011-10-02 | Disposition: A | Discharge status: Home / Self Care | Payer: 59 | Source: Ambulatory Visit | Attending: "Endocrinology | Admitting: "Endocrinology

## 2011-10-02 ENCOUNTER — Encounter (HOSPITAL_COMMUNITY): Payer: Self-pay

## 2011-10-02 DIAGNOSIS — R946 Abnormal results of thyroid function studies: Secondary | ICD-10-CM | POA: Insufficient documentation

## 2011-10-02 DIAGNOSIS — E0590 Thyrotoxicosis, unspecified without thyrotoxic crisis or storm: Secondary | ICD-10-CM | POA: Insufficient documentation

## 2011-10-02 MED ORDER — SODIUM IODIDE I 131 CAPSULE
7.1000 | Freq: Once | INTRAVENOUS | Status: AC | PRN
  Administered 2011-10-02: 7.1 via ORAL

## 2011-10-03 ENCOUNTER — Encounter (HOSPITAL_COMMUNITY)
Admission: RE | Admit: 2011-10-03 | Discharge: 2011-10-03 | Disposition: A | Discharge status: Home / Self Care | Payer: 59 | Source: Ambulatory Visit | Attending: "Endocrinology | Admitting: "Endocrinology

## 2011-10-03 MED ORDER — SODIUM PERTECHNETATE TC 99M INJECTION
10.0000 | Freq: Once | INTRAVENOUS | Status: AC | PRN
  Administered 2011-10-03: 10 via INTRAVENOUS

## 2011-10-19 ENCOUNTER — Ambulatory Visit (INDEPENDENT_AMBULATORY_CARE_PROVIDER_SITE_OTHER): Payer: 59 | Admitting: *Deleted

## 2011-10-19 ENCOUNTER — Encounter: Payer: Self-pay | Admitting: Internal Medicine

## 2011-10-19 DIAGNOSIS — I472 Ventricular tachycardia: Secondary | ICD-10-CM

## 2011-10-19 DIAGNOSIS — I4729 Other ventricular tachycardia: Secondary | ICD-10-CM

## 2011-10-20 LAB — REMOTE ICD DEVICE
AL AMPLITUDE: 1.9 mv
ATRIAL PACING ICD: 0 pct
CHARGE TIME: 8.7 s
DEV-0020ICD: NEGATIVE
DEVICE MODEL ICD: 168307
HV IMPEDENCE: 54 Ohm
PACEART VT: 0
RV LEAD AMPLITUDE: 15.1 mv
TZAT-0001FASTVT: 1
TZAT-0013FASTVT: 2
TZAT-0018FASTVT: NEGATIVE
TZAT-0018FASTVT: NEGATIVE
TZST-0001FASTVT: 3
TZST-0001FASTVT: 4
TZST-0001FASTVT: 8
TZST-0003FASTVT: 26 J
TZST-0003FASTVT: 41 J
TZST-0003FASTVT: 41 J
VENTRICULAR PACING ICD: 0 pct

## 2011-10-30 ENCOUNTER — Encounter (HOSPITAL_COMMUNITY): Payer: Self-pay | Admitting: *Deleted

## 2011-10-30 ENCOUNTER — Emergency Department (HOSPITAL_COMMUNITY)
Admission: EM | Admit: 2011-10-30 | Discharge: 2011-10-30 | Disposition: A | Discharge status: Home / Self Care | Payer: 59 | Attending: Emergency Medicine | Admitting: Emergency Medicine

## 2011-10-30 ENCOUNTER — Emergency Department (HOSPITAL_COMMUNITY): Payer: 59

## 2011-10-30 DIAGNOSIS — E119 Type 2 diabetes mellitus without complications: Secondary | ICD-10-CM | POA: Insufficient documentation

## 2011-10-30 DIAGNOSIS — R209 Unspecified disturbances of skin sensation: Secondary | ICD-10-CM | POA: Insufficient documentation

## 2011-10-30 DIAGNOSIS — Z9089 Acquired absence of other organs: Secondary | ICD-10-CM | POA: Insufficient documentation

## 2011-10-30 DIAGNOSIS — I129 Hypertensive chronic kidney disease with stage 1 through stage 4 chronic kidney disease, or unspecified chronic kidney disease: Secondary | ICD-10-CM | POA: Insufficient documentation

## 2011-10-30 DIAGNOSIS — Z794 Long term (current) use of insulin: Secondary | ICD-10-CM | POA: Insufficient documentation

## 2011-10-30 DIAGNOSIS — N189 Chronic kidney disease, unspecified: Secondary | ICD-10-CM | POA: Insufficient documentation

## 2011-10-30 DIAGNOSIS — E079 Disorder of thyroid, unspecified: Secondary | ICD-10-CM | POA: Insufficient documentation

## 2011-10-30 DIAGNOSIS — R42 Dizziness and giddiness: Secondary | ICD-10-CM | POA: Insufficient documentation

## 2011-10-30 DIAGNOSIS — Z951 Presence of aortocoronary bypass graft: Secondary | ICD-10-CM | POA: Insufficient documentation

## 2011-10-30 DIAGNOSIS — I2510 Atherosclerotic heart disease of native coronary artery without angina pectoris: Secondary | ICD-10-CM | POA: Insufficient documentation

## 2011-10-30 DIAGNOSIS — Z7982 Long term (current) use of aspirin: Secondary | ICD-10-CM | POA: Insufficient documentation

## 2011-10-30 DIAGNOSIS — E785 Hyperlipidemia, unspecified: Secondary | ICD-10-CM | POA: Insufficient documentation

## 2011-10-30 HISTORY — DX: Disorder of thyroid, unspecified: E07.9

## 2011-10-30 LAB — COMPREHENSIVE METABOLIC PANEL
ALT: 49 U/L (ref 0–53)
CO2: 25 mEq/L (ref 19–32)
Calcium: 9.6 mg/dL (ref 8.4–10.5)
Chloride: 99 mEq/L (ref 96–112)
Creatinine, Ser: 1.2 mg/dL (ref 0.50–1.35)
GFR calc Af Amer: 82 mL/min — ABNORMAL LOW (ref >90)
GFR calc non Af Amer: 70 mL/min — ABNORMAL LOW (ref >90)
Glucose, Bld: 213 mg/dL — ABNORMAL HIGH (ref 70–99)
Sodium: 132 mEq/L — ABNORMAL LOW (ref 135–145)
Total Bilirubin: 0.4 mg/dL (ref 0.3–1.2)

## 2011-10-30 LAB — CBC WITH DIFFERENTIAL/PLATELET
Eosinophils Relative: 2 % (ref 0–5)
HCT: 34.7 % — ABNORMAL LOW (ref 39.0–52.0)
Lymphocytes Relative: 25 % (ref 12–46)
Lymphs Abs: 1.8 10*3/uL (ref 0.7–4.0)
MCV: 79.8 fL (ref 78.0–100.0)
Monocytes Absolute: 0.5 10*3/uL (ref 0.1–1.0)
Neutro Abs: 4.8 10*3/uL (ref 1.7–7.7)
RBC: 4.35 MIL/uL (ref 4.22–5.81)
WBC: 7.2 10*3/uL (ref 4.0–10.5)

## 2011-10-30 MED ORDER — LORAZEPAM 2 MG/ML IJ SOLN
0.5000 mg | Freq: Once | INTRAMUSCULAR | Status: AC
  Administered 2011-10-30: 0.5 mg via INTRAVENOUS
  Filled 2011-10-30 (×2): qty 1

## 2011-10-30 MED ORDER — METOCLOPRAMIDE HCL 5 MG/ML IJ SOLN
10.0000 mg | Freq: Once | INTRAMUSCULAR | Status: AC
  Administered 2011-10-30: 10 mg via INTRAVENOUS
  Filled 2011-10-30: qty 2

## 2011-10-30 MED ORDER — ONDANSETRON HCL 4 MG PO TABS: 4.0000 mg | ORAL_TABLET | Freq: Four times a day (QID) | ORAL | Status: AC | PRN

## 2011-10-30 MED ORDER — ONDANSETRON HCL 4 MG/2ML IJ SOLN
4.0000 mg | Freq: Once | INTRAMUSCULAR | Status: AC
  Administered 2011-10-30: 4 mg via INTRAVENOUS
  Filled 2011-10-30: qty 2

## 2011-10-30 MED ORDER — MECLIZINE HCL 25 MG PO TABS
25.0000 mg | ORAL_TABLET | Freq: Four times a day (QID) | ORAL | Status: AC | PRN
Start: 2011-10-30 — End: 2011-11-09

## 2011-10-30 MED ORDER — MECLIZINE HCL 12.5 MG PO TABS
25.0000 mg | ORAL_TABLET | Freq: Once | ORAL | Status: AC
  Administered 2011-10-30: 25 mg via ORAL
  Filled 2011-10-30: qty 2

## 2011-10-30 MED ORDER — DIPHENHYDRAMINE HCL 50 MG/ML IJ SOLN
25.0000 mg | Freq: Once | INTRAMUSCULAR | Status: AC
  Administered 2011-10-30: 25 mg via INTRAVENOUS
  Filled 2011-10-30: qty 1
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
## 2011-10-30 NOTE — ED Notes (Addendum)
Patient relates being under a lot of work stress now.  He has had slight difficulty getting words out correctly for couple days.  Numbness in face and hands started last evening and has continued today.  He has had recent hx of some "anxiety attacks" and has been worked up by Dr. Fransico Him for hyperthyroidism.  He has an AICD pacer.

## 2011-10-30 NOTE — ED Notes (Signed)
Pt with numbness to face and to hands, started yesterday, unable to focus as usual

## 2011-10-30 NOTE — ED Notes (Signed)
Patient with no complaints at this time. Respirations even and unlabored. Skin warm/dry. Discharge instructions reviewed with patient at this time. Patient given opportunity to voice concerns/ask questions. IV removed per policy and band-aid applied to site. Patient discharged at this time and left Emergency Department with steady gait.  

## 2011-10-30 NOTE — ED Notes (Signed)
Patient states numbness is better.

## 2011-10-31 ENCOUNTER — Ambulatory Visit: Payer: 59 | Admitting: Family Medicine

## 2011-11-01 ENCOUNTER — Encounter: Payer: Self-pay | Admitting: Family Medicine

## 2011-11-01 ENCOUNTER — Ambulatory Visit (INDEPENDENT_AMBULATORY_CARE_PROVIDER_SITE_OTHER): Payer: 59 | Admitting: Family Medicine

## 2011-11-01 VITALS — BP 122/80 | HR 99 | Resp 15 | Ht 69.0 in | Wt 269.4 lb

## 2011-11-01 DIAGNOSIS — R209 Unspecified disturbances of skin sensation: Secondary | ICD-10-CM

## 2011-11-01 DIAGNOSIS — F439 Reaction to severe stress, unspecified: Secondary | ICD-10-CM

## 2011-11-01 DIAGNOSIS — F329 Major depressive disorder, single episode, unspecified: Secondary | ICD-10-CM

## 2011-11-01 DIAGNOSIS — F419 Anxiety disorder, unspecified: Secondary | ICD-10-CM

## 2011-11-01 DIAGNOSIS — E1065 Type 1 diabetes mellitus with hyperglycemia: Secondary | ICD-10-CM

## 2011-11-01 DIAGNOSIS — R200 Anesthesia of skin: Secondary | ICD-10-CM | POA: Insufficient documentation

## 2011-11-01 DIAGNOSIS — E785 Hyperlipidemia, unspecified: Secondary | ICD-10-CM

## 2011-11-01 DIAGNOSIS — F411 Generalized anxiety disorder: Secondary | ICD-10-CM

## 2011-11-01 DIAGNOSIS — R42 Dizziness and giddiness: Secondary | ICD-10-CM

## 2011-11-01 DIAGNOSIS — F430 Acute stress reaction: Secondary | ICD-10-CM

## 2011-11-01 DIAGNOSIS — R0989 Other specified symptoms and signs involving the circulatory and respiratory systems: Secondary | ICD-10-CM

## 2011-11-01 DIAGNOSIS — I10 Essential (primary) hypertension: Secondary | ICD-10-CM

## 2011-11-01 DIAGNOSIS — R202 Paresthesia of skin: Secondary | ICD-10-CM | POA: Insufficient documentation

## 2011-11-01 NOTE — Patient Instructions (Addendum)
F/\U in October, please call with any concerns prior to this.  You are referred to ENT re dizziness.  You are referred for ultrasound of neck arteries to ensure no blockage   You are referred to hand surgeon re weakness , numbness and tingling.  You are referred to [facility] re stress, anxiety and ability to work.  Please call Dr Fransico Him re low blood sugars, if you have difficulty with a response , if you notify this office I will directly contact him also on your behalf.  Re permanent disabilty this is through social services

## 2011-11-01 NOTE — Assessment & Plan Note (Addendum)
pls refer to ENT in [facility] asap appt for vertigo x 6 months which is disabling

## 2011-11-01 NOTE — Progress Notes (Signed)
  Subjective:    Patient ID: Matthew [name], male    DOB: [birth year], 46 y.o.   MRN: [id number]  HPI Intermittent recurrent vertigo x 6 months, has not seen ENT , up to this time and has not filled script for meclizine  Hears and feels pulsations in head and neck and arms  states he  Had  low sugar at work last Friday , nearly passedout, wants to getout of work for his medical conditions, which are overwhelming and uncontrolled, also has significant amt of stress on the job , anxiety and depression remain and are disabling.He is not suicidal or homicidal Numbness in hands and feet, shakes hands for relief waking up at night x 3 month, also wekaness in hands noted   Review of Systems See HPI Denies recent fever or chills. Denies sinus pressure, nasal congestion, ear pain or sore throat. Denies chest congestion, productive cough or wheezing. Intermittent  chest pains,denies  Palpitations, PND , orthopnea  and leg swelling Denies abdominal pain, nausea, vomiting,diarrhea or constipation.   Denies dysuria, frequency, hesitancy or incontinence. Denies joint pain, swelling and limitation in mobility. Denies skin break down or rash.        Objective:   Physical Exam Patient alert and oriented and in no cardiopulmonary distress.  HEENT: No facial asymmetry, EOMI, no sinus tenderness,  oropharynx pink and moist.  Neck supple no adenopathy.  Chest: Clear to auscultation bilaterally.  CVS: S1, S2 no murmurs, no S3.  ABD: Soft non tender. Bowel sounds normal.  Ext: No edema  MS: Adequate ROM spine, shoulders, hips and knees.Tinnel's sign positive bilaterally, also weakness in grip bilaterally and wasting of thenar eminence  Skin: Intact, no ulcerations or rash noted.  Psych: Good eye contact, blunted  affect. Memory intact  anxious and  depressed appearing.  CNS: CN 2-12 intact       Assessment & Plan:

## 2011-11-02 ENCOUNTER — Ambulatory Visit (HOSPITAL_COMMUNITY)
Admission: RE | Admit: 2011-11-02 | Discharge: 2011-11-02 | Disposition: A | Discharge status: Home / Self Care | Payer: 59 | Source: Ambulatory Visit | Attending: Family Medicine | Admitting: Family Medicine

## 2011-11-02 DIAGNOSIS — R0989 Other specified symptoms and signs involving the circulatory and respiratory systems: Secondary | ICD-10-CM | POA: Insufficient documentation

## 2011-11-02 DIAGNOSIS — E119 Type 2 diabetes mellitus without complications: Secondary | ICD-10-CM | POA: Insufficient documentation

## 2011-11-03 ENCOUNTER — Telehealth: Payer: Self-pay | Admitting: Family Medicine

## 2011-11-06 NOTE — Assessment & Plan Note (Signed)
Reports improvement in blood sugars, now followed by endo

## 2011-11-06 NOTE — Assessment & Plan Note (Signed)
Controlled, no change in medication DASH diet and commitment to daily physical activity for a minimum of 30 minutes discussed and encouraged, as a part of hypertension management. The importance of attaining a healthy weight is also discussed.  

## 2011-11-06 NOTE — Assessment & Plan Note (Signed)
Uncontrolled interfering with ability to work, will refer to behavioral health

## 2011-11-06 NOTE — Assessment & Plan Note (Signed)
Uncontrolled will refer to behavioral health, pt reports diminished ability to work

## 2011-11-06 NOTE — Telephone Encounter (Signed)
noted 

## 2011-11-06 NOTE — Assessment & Plan Note (Signed)
Deteriorating with weakness and pt states pain awakens him at night at times, refer to hand surgeon

## 2011-11-06 NOTE — Assessment & Plan Note (Signed)
Hyperlipidemia:Low fat diet discussed and encouraged.  Will f/u most recent labs

## 2011-11-06 NOTE — Assessment & Plan Note (Signed)
Carotid doppler to further evaluate , pt has symptoms suggestive of cerebrovascular disease , and has established CAD

## 2011-11-13 ENCOUNTER — Ambulatory Visit (INDEPENDENT_AMBULATORY_CARE_PROVIDER_SITE_OTHER): Payer: 59 | Admitting: Psychiatry

## 2011-11-13 ENCOUNTER — Encounter (HOSPITAL_COMMUNITY): Payer: Self-pay | Admitting: Psychiatry

## 2011-11-13 DIAGNOSIS — F411 Generalized anxiety disorder: Secondary | ICD-10-CM

## 2011-11-13 DIAGNOSIS — F419 Anxiety disorder, unspecified: Secondary | ICD-10-CM

## 2011-11-17 NOTE — Patient Instructions (Signed)
Discussed orally 
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
## 2011-11-20 ENCOUNTER — Ambulatory Visit: Payer: 59 | Admitting: Family Medicine

## 2011-11-20 ENCOUNTER — Ambulatory Visit (INDEPENDENT_AMBULATORY_CARE_PROVIDER_SITE_OTHER): Payer: 59 | Admitting: Psychiatry

## 2011-11-20 DIAGNOSIS — F329 Major depressive disorder, single episode, unspecified: Secondary | ICD-10-CM

## 2011-11-20 DIAGNOSIS — F419 Anxiety disorder, unspecified: Secondary | ICD-10-CM

## 2011-11-20 DIAGNOSIS — F411 Generalized anxiety disorder: Secondary | ICD-10-CM

## 2011-11-20 DIAGNOSIS — F32A Depression, unspecified: Secondary | ICD-10-CM

## 2011-11-21 NOTE — Patient Instructions (Signed)
Discussed orally 

## 2011-11-21 NOTE — Progress Notes (Signed)
Patient:  Matthew [name]   DOB: 1965-03-10  MR Number: [id number]  Location: [facility]:  [address] [place],  [place], [address]  Start: Monday 11/20/2011 9:00 AM End: Monday 11/19/2011 9:50 AM  Provider/Observer:     Florencia Reasons, MSW, LCSW   Chief Complaint:      Chief Complaint  Patient presents with  . Anxiety  . Depression    Reason For Service:     The patient was referred for services by primary care physician Dr. Lodema Hong due to patient experiencing anxiety and stress. The patient reports working at [employer] where the work demands are stressful and unpredictable. 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 Patient reports this is third time he has worked for [employer]. He is salaried and works very long hours and states that he always does whatever is asked. He reports being suspended 2 weeks ago without any explanation and then suddenly being asked to return to work after customers started requesting to work only with patient rather than other employees. Patient reports experiencing significant anxiety and episodes at work that include breaking out in a sweat, shaking, heart beating fast, dizziness, breathing difficulty, difficulty talking, and hands going numb. He states the more he tries to control it, the worse it gets. He reports these episodes began about 6 or 7 months ago and are debilitating. Patient reports a history of having 2 heart attacks resulting in quadruple bypass surgery 2 years ago and now has a defibrillator and a pacemaker. Patient fears he is having heart problems again when he is having these episodes. Patient is seen for follow up appointment today.   Interventions Strategy:  Supportive therapy  Participation Level:   Active  Participation Quality:  Appropriate      Behavioral Observation:  Casual, Alert, and Tearful.   Current Psychosocial Factors: The  patient is working a very stressful work environment.  Content of Session:   Reviewing symptoms, establishing rapport, exploring relaxation techniques, exploring options, discussing possible referral for medication evaluation  Current Status:   Patient reports continued anxiety, depression, sleep difficulty, racing thoughts, memory difficulty, excessive worrying, low energy, and poor concentration.  Patient Progress:   Poor. Patient reports continued stress and anxiety regarding his job. He also is experiencing nightmares about his job and reports dreading to go to work due to stress. Per patient's report, the owner and office manager both give the patient unreasonable assignments and deadlines. He also is on call 24 /7 and has to carry a cell phone/pager that he is expected to respond to immediately per patient's report. He reports living in constant fear of being reprimanded and losing his job. He's also been asked by one of co-owners to do unethical actions. Patient reports wanting to look for another job but having difficulty accessing the Internet unless he uses his parents' computer. He reports not wanting to disturb his parents. Therapist works with patient to explore ways to adjust his schedule to allow time for job hunting. Therapist also works with patient to explore relaxation techniques such as diaphragmatic breathing and meditation. Therapist and patient also discuss possible referral for medication for evaluation and will discuss more at next appointment.  Target Goals:   Establishing rapport, decrease anxiety  Last Reviewed:     Goals Addressed Today:    Establishing rapport, decrease anxiety  Impression/Diagnosis: The patient presents with significant symptoms of anxiety that  began 6 or 7 months ago. Patient reports having episodes where he experiences breaking out in a sweat, shaking, heart beating fast, dizziness, breathing difficulty, difficulty talking, and hands going numb.  Patient also experiences sadness, mood swings, and sleep difficulty. Patient also presents with multiple health issues including hypertension, diabetes, hyper lipidemia, thyroid disease, and chronic systolic heart failure. Diagnoses: Anxiety disorder, rule out panic disorder without agoraphobia, depressive disorder NOS     Diagnosis:  Axis I:  1. Anxiety disorder   2. Depressive disorder             Axis II: Deferred

## 2011-11-27 ENCOUNTER — Ambulatory Visit (INDEPENDENT_AMBULATORY_CARE_PROVIDER_SITE_OTHER): Payer: 59 | Admitting: Psychiatry

## 2011-11-27 DIAGNOSIS — F329 Major depressive disorder, single episode, unspecified: Secondary | ICD-10-CM

## 2011-11-27 DIAGNOSIS — F411 Generalized anxiety disorder: Secondary | ICD-10-CM

## 2011-11-27 DIAGNOSIS — F419 Anxiety disorder, unspecified: Secondary | ICD-10-CM

## 2011-11-27 NOTE — Progress Notes (Signed)
Patient:  Matthew [name]   DOB: [birth year]  MR Number: [id number]  Location: [facility]:  [address] [place],  [place], [address]  Start: Monday 11/27/2011 9:00 AM End: Monday 11/27/2011 9:50 AM  Provider/Observer:     Florencia Reasons, MSW, LCSW   Chief Complaint:      Chief Complaint  Patient presents with  . Anxiety  . Depression    Reason For Service:     The patient was referred for services by primary care physician Dr. Lodema Hong due to patient experiencing anxiety and stress. The patient reports working at [employer] where the work demands are stressful and unpredictable. 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 Patient reports this is third time he has worked for [employer]. He is salaried and works very long hours and states that he always does whatever is asked. He reports being suspended 2 weeks ago without any explanation and then suddenly being asked to return to work after customers started requesting to work only with patient rather than other employees. Patient reports experiencing significant anxiety and episodes at work that include breaking out in a sweat, shaking, heart beating fast, dizziness, breathing difficulty, difficulty talking, and hands going numb. He states the more he tries to control it, the worse it gets. He reports these episodes began about 6 or 7 months ago and are debilitating. Patient reports a history of having 2 heart attacks resulting in quadruple bypass surgery 2 years ago and now has a defibrillator and a pacemaker. Patient fears he is having heart problems again when he is having these episodes. Patient is seen for follow up appointment today.   Interventions Strategy:  Supportive therapy  Participation Level:   Active  Participation Quality:  Appropriate      Behavioral Observation:  Casual, Alert, and Tearful.   Current Psychosocial Factors: The  patient is working a very stressful work environment.  Content of Session:   Reviewing symptoms, exploring relaxation techniques, exploring options, discussing referral for medication evaluation  Current Status:   Patient reports continued anxiety, depression, sleep difficulty, racing thoughts, memory difficulty, excessive worrying, low energy, and poor concentration.  Patient Progress:   Poor. Patient reports continued stress and anxiety regarding his job. He reports trying to use diaphragmatic breathing as well as meditation but this has provided only minimal relief. The patient continues to struggle with scheduling time for job hunting. 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 The patient agrees to see Dr. Lolly Mustache for a medication evaluation.  He does not wish to continue pursuing psychotherapy at this time.  He and therapist agree  to close patient's case. He is encouraged to contact this practice should he decided to pursue psychotherapy in the future  Target Goals:    decrease anxiety  Last Reviewed:     Goals Addressed Today:    decrease anxiety  Impression/Diagnosis: The patient presents with significant symptoms of anxiety that began 6 or 7 months ago. Patient reports having episodes where he experiences breaking out in a sweat, shaking, heart beating fast, dizziness, breathing difficulty, difficulty talking, and hands going numb. Patient also experiences sadness, mood swings, and sleep difficulty. Patient also presents with multiple health issues including hypertension, diabetes, hyper lipidemia, thyroid disease, and chronic systolic heart failure. Diagnoses:  Anxiety disorder, rule out panic disorder without agoraphobia, depressive disorder NOS     Diagnosis:  Axis I:  1. Anxiety disorder   2. Depressive disorder             Axis II: Deferred

## 2011-11-27 NOTE — Patient Instructions (Signed)
Discussed orally 

## 2011-12-14 ENCOUNTER — Ambulatory Visit (HOSPITAL_COMMUNITY): Payer: Self-pay | Admitting: Psychiatry

## 2011-12-20 ENCOUNTER — Ambulatory Visit: Payer: 59 | Admitting: Family Medicine

## 2011-12-27 ENCOUNTER — Encounter: Payer: Self-pay | Admitting: Family Medicine

## 2012-01-02 ENCOUNTER — Ambulatory Visit (HOSPITAL_COMMUNITY): Payer: 59 | Admitting: Psychiatry

## 2012-01-25 ENCOUNTER — Ambulatory Visit (INDEPENDENT_AMBULATORY_CARE_PROVIDER_SITE_OTHER): Payer: 59 | Admitting: *Deleted

## 2012-01-25 DIAGNOSIS — I472 Ventricular tachycardia: Secondary | ICD-10-CM

## 2012-01-25 DIAGNOSIS — I4729 Other ventricular tachycardia: Secondary | ICD-10-CM

## 2012-01-25 DIAGNOSIS — Z95810 Presence of automatic (implantable) cardiac defibrillator: Secondary | ICD-10-CM

## 2012-01-31 ENCOUNTER — Encounter: Payer: Self-pay | Admitting: Pulmonary Disease

## 2012-02-02 ENCOUNTER — Ambulatory Visit (INDEPENDENT_AMBULATORY_CARE_PROVIDER_SITE_OTHER): Payer: 59 | Admitting: Pulmonary Disease

## 2012-02-02 ENCOUNTER — Encounter: Payer: Self-pay | Admitting: Pulmonary Disease

## 2012-02-02 VITALS — BP 118/64 | HR 65 | Temp 98.3°F | Ht 69.0 in | Wt 265.0 lb

## 2012-02-02 DIAGNOSIS — G4733 Obstructive sleep apnea (adult) (pediatric): Secondary | ICD-10-CM

## 2012-02-02 HISTORY — DX: Obstructive sleep apnea (adult) (pediatric): G47.33

## 2012-02-02 LAB — REMOTE ICD DEVICE
AL AMPLITUDE: 1.6 mv
AL IMPEDENCE ICD: 479 Ohm
CHARGE TIME: 8.7 s
DEVICE MODEL ICD: 168307
HV IMPEDENCE: 54 Ohm
PACEART VT: 0
RV LEAD AMPLITUDE: 14.2 mv
RV LEAD IMPEDENCE ICD: 502 Ohm
TOT-0006: 20130815000000
TZAT-0001FASTVT: 1
TZAT-0001FASTVT: 2
TZAT-0013FASTVT: 2
TZAT-0018FASTVT: NEGATIVE
TZON-0003FASTVT: 333.3 ms
TZST-0001FASTVT: 5
TZST-0001FASTVT: 6
TZST-0003FASTVT: 26 J
TZST-0003FASTVT: 36 J
TZST-0003FASTVT: 41 J

## 2012-02-02 NOTE — Patient Instructions (Addendum)
Will set up for a sleep study, and will arrange followup once the results are available.  

## 2012-02-02 NOTE — Progress Notes (Signed)
  Subjective:    Patient ID: Matthew [name], male    DOB: 1965/08/06, 46 y.o.   MRN: [id number]  HPI The patient is a 46 year old male who been asked to see for possible obstructive sleep apnea.  The patient has been having issues with fuzziness during the day as well as decreased cognition.  He has also had some atypical headaches.  He gives a history for loud snoring, and has been told by observers that he has an abnormal breathing pattern during sleep.  He will typically awaken to 3 times a night, and feels unrested in the mornings at least 70% of the time.  He does note some sleep pressure at times during the day with activity, and this is most common after lunch.  He denies any sleepiness issues in the evening, but can get sleepy driving longer distances.  The patient states that his weight is up 40 pounds over the last 2 years, and his Epworth score today is abnormal at 14  Sleep Questionnaire: What time do you typically go to bed?( Between what hours) 11 pm How long does it take you to fall asleep? 30 minutes How many times during the night do you wake up? 3 What time do you get out of bed to start your day? 0630 Do you drive or operate heavy machinery in your occupation? No How much has your weight changed (up or down) over the past two years? (In pounds) 40 lb (18.144 kg) Have you ever had a sleep study before? No Do you currently use CPAP? No Do you wear oxygen at any time? No    Review of Systems  Constitutional: Positive for unexpected weight change. Negative for fever.  HENT: Positive for sore throat, sneezing, trouble swallowing and dental problem. Negative for ear pain, nosebleeds, congestion, rhinorrhea, postnasal drip and sinus pressure.   Eyes: Negative for redness and itching.  Respiratory: Positive for cough and shortness of breath. Negative for chest tightness and wheezing.   Cardiovascular: Positive for leg swelling. Negative for palpitations.  Gastrointestinal: Negative for  nausea and vomiting.  Genitourinary: Negative for dysuria.  Musculoskeletal: Positive for joint swelling.  Skin: Negative for rash.  Neurological: Positive for headaches.  Hematological: Does not bruise/bleed easily.  Psychiatric/Behavioral: Positive for dysphoric mood. The patient is nervous/anxious.        Objective:   Physical Exam Constitutional:  Obese male, no acute distress  HENT:  Nares patent without discharge  Oropharynx without exudate, palate and uvula are elongated.   Eyes:  Perrla, eomi, no scleral icterus  Neck:  No JVD, no TMG  Cardiovascular:  Normal rate, regular rhythm, no rubs or gallops.  No murmurs        Intact distal pulses  Pulmonary :  Normal breath sounds, no stridor or respiratory distress   No rales, rhonchi, or wheezing  Abdominal:  Soft, nondistended, bowel sounds present.  No tenderness noted.   Musculoskeletal:  1+ lower extremity edema noted.  Lymph Nodes:  No cervical lymphadenopathy noted  Skin:  No cyanosis noted  Neurologic:  Mildly sleepy, appropriate, moves all 4 extremities without obvious deficit.         Assessment & Plan:

## 2012-02-02 NOTE — Assessment & Plan Note (Signed)
The patient's history is suggestive of clinically significant sleep disordered breathing.  He is also obese with a large neck, and has abnormal upper airway anatomy.  A long discussion with him about the pathophysiology of sleep apnea, including its impact to his quality of life and cardiovascular health.  At this point, I think he needs to have a sleep study done for evaluation, and the patient is agreeable.

## 2012-02-12 ENCOUNTER — Other Ambulatory Visit: Payer: Self-pay | Admitting: Orthopedic Surgery

## 2012-02-14 ENCOUNTER — Encounter (HOSPITAL_COMMUNITY): Payer: Self-pay | Admitting: Respiratory Therapy

## 2012-02-16 ENCOUNTER — Ambulatory Visit (HOSPITAL_COMMUNITY): Payer: 59 | Admitting: Psychiatry

## 2012-02-19 ENCOUNTER — Encounter (HOSPITAL_COMMUNITY)
Admission: RE | Admit: 2012-02-19 | Discharge: 2012-02-19 | Disposition: A | Discharge status: Home / Self Care | Payer: 59 | Source: Ambulatory Visit | Attending: Orthopedic Surgery | Admitting: Orthopedic Surgery

## 2012-02-19 ENCOUNTER — Encounter (HOSPITAL_COMMUNITY): Payer: Self-pay

## 2012-02-19 HISTORY — DX: Anxiety disorder, unspecified: F41.9

## 2012-02-19 HISTORY — DX: Headache: R51

## 2012-02-19 LAB — BASIC METABOLIC PANEL
Chloride: 101 mEq/L (ref 96–112)
Creatinine, Ser: 1.46 mg/dL — ABNORMAL HIGH (ref 0.50–1.35)
GFR calc Af Amer: 64 mL/min — ABNORMAL LOW (ref >90)
GFR calc non Af Amer: 55 mL/min — ABNORMAL LOW (ref >90)
Potassium: 4.9 mEq/L (ref 3.5–5.1)

## 2012-02-19 LAB — CBC
HCT: 39 % (ref 39.0–52.0)
MCHC: 34.9 g/dL (ref 30.0–36.0)
Platelets: 144 10*3/uL — ABNORMAL LOW (ref 150–400)
RDW: 13.8 % (ref 11.5–15.5)
WBC: 9.4 10*3/uL (ref 4.0–10.5)

## 2012-02-19 LAB — SURGICAL PCR SCREEN
MRSA, PCR: NEGATIVE
Staphylococcus aureus: POSITIVE — AB

## 2012-02-19 MED ORDER — CHLORHEXIDINE GLUCONATE 4 % EX LIQD: 60.0000 mL | Freq: Once | CUTANEOUS | Status: DC

## 2012-02-19 NOTE — Pre-Procedure Instructions (Signed)
20 CRIT [address]MSKI  02/19/2012   Your procedure is scheduled on:  Thursday December 19 at 1530 PM  Report to [facility] at 1330 PM.  Call this number if you have problems the morning of surgery: [phone]   Remember:   Do not eat food or drink:After Midnight.Wednesday      Take these medicines the morning of surgery with A SIP OF WATER: Buspar, Isosorbide mononitrate ,Lorazepam, Metoprolol,and Effexor XR.  Stop Aspirin and herbal medicines. No insulin.   Do not wear jewelry.  Do not wear lotions. You may wear deodorant.   Men may shave face and neck.  Do not bring valuables to the hospital.  Contacts, dentures or bridgework may not be worn into surgery.  Leave suitcase in the car. After surgery it may be brought to your room.  For patients admitted to the hospital, checkout time is 11:00 AM the day of discharge.   Patients discharged the day of surgery will not be allowed to drive home.    Special Instructions: Shower using CHG 2 nights before surgery and the night before surgery.  If you shower the day of surgery use CHG.  Use special wash - you have one bottle of CHG for all showers.  You should use approximately 1/3 of the bottle for each shower.   Please read over the following fact sheets that you were given: Pain Booklet, Coughing and Deep Breathing, MRSA Information and Surgical Site Infection Prevention

## 2012-02-19 NOTE — Progress Notes (Signed)
Dr Lewayne Bunting [phone]. Orders faxed to office for ICD. Pt surgery 02/22/12. Device [employer].

## 2012-02-20 NOTE — Consult Note (Addendum)
Anesthesia chart review: Patient is a 46 year old male scheduled for right carpal, release and right cubital tunnel release, ulnar nerve decompression and anterior transposition as needed on 02/22/2012 by Dr. Amanda Pea.   History includes nonsmoker, morbid obesity (BMI 40), CAD s/p CABG (sequential LIMA to DIAG and LAD, sequential SVG to OM and distal CX) 09/01/09, ischemic cardiomyopathy, v-fib arrest 08/2009 s/p dual chamber [employer] ICD 09/08/09, chronic systolic CHF, HTN, CKD, DM type 2, HLD, anxiety, thyroid disease (not specified), diabetic coma and pancreatitis in '94.  He is currently being evaluated by Dr. Marcelyn Bruins for possible OSA, but it does not appear that he has had a formal sleep study as of yet. PCP is Dr. Syliva Overman.  Cardiologist is Dr. Lewayne Bunting, last visit on 07/21/11.  His ICD device was working normally, there was no sustained ventricular arrhythmias, and CHF class II symptoms were felt well controlled. No change in medical therapy was made.  (He has also been seen by Dr. Tonny Bollman, last visit 12/21/10 following a cardiac cath that showed new moderate stenosis in the RCA, but otherwise no significant change in his anatomy.  Medical therapy was recommended at that time.)  Dr. Ladona Ridgel cleared patient for this procedure with "low risk."  EKG on 10/30/11 showed NSR, LAD, possible lateral infarct (age undetermined).  Overall, I think it is stable when compared to his last EKG at [facility] on 10/30/11.  Cardiac cath on 11/22/10 showed: 1. Continued patency of the internal mammary to the diagonal and LAD.  2. Continued patency to two branches of the circumflex marginal.  3. Moderate stenosis involving the proximal right coronary artery, 60-70% at the proximal to mid proximal junction. Medical therapy was ultimately recommended.  Nuclear stress test on 11/04/10 showed: Myoview scan with evidence of scar with minimal periinfarct ishemia in the  inferior/inferolateral wall. Cannot exclude some concomitant soft tissue attenuation.  EF 53%.  Basal inferior hypokinesis.  It was felt to be a low risk study.  Echo on 12/28/09 showed inferoseptal and apical hypokinesis,  estimated EF 35%. Left atrium moderately dilated. No atrial septal defect or PFO noted. Trivial mitral regurgitation. Mild tricuspid regurgitation.  Chest x-ray on 10/30/2011 showed low lung volumes without acute findings.  Pre-operative labs noted.  Cr 1.46, stable since 02/2011.  Non-fasting glucose 302.  His last A1C on 05/11/11 was elevated at 12.9.   I reviewed history with Anesthesiologist Dr. Noreene Larsson (prior to receiving clearance note).  If no new CV/CHF symptoms then anticipate patient can proceed.    Shonna Chock, PA-C 02/20/12 1800

## 2012-02-21 MED ORDER — CEFAZOLIN SODIUM-DEXTROSE 2-3 GM-% IV SOLR
2.0000 g | INTRAVENOUS | Status: AC
  Administered 2012-02-22: 2 g via INTRAVENOUS
  Filled 2012-02-21: qty 50

## 2012-02-22 ENCOUNTER — Encounter (HOSPITAL_COMMUNITY): Payer: Self-pay | Admitting: Vascular Surgery

## 2012-02-22 ENCOUNTER — Encounter (HOSPITAL_COMMUNITY)
Admission: RE | Disposition: A | Discharge status: Home / Self Care | Payer: Self-pay | Source: Ambulatory Visit | Attending: Orthopedic Surgery

## 2012-02-22 ENCOUNTER — Ambulatory Visit (HOSPITAL_COMMUNITY): Payer: 59 | Admitting: Vascular Surgery

## 2012-02-22 ENCOUNTER — Encounter (HOSPITAL_COMMUNITY): Payer: Self-pay | Admitting: *Deleted

## 2012-02-22 ENCOUNTER — Ambulatory Visit (HOSPITAL_COMMUNITY)
Admission: RE | Admit: 2012-02-22 | Discharge: 2012-02-22 | Disposition: A | Discharge status: Home / Self Care | Payer: 59 | Source: Ambulatory Visit | Attending: Orthopedic Surgery | Admitting: Orthopedic Surgery

## 2012-02-22 DIAGNOSIS — G5600 Carpal tunnel syndrome, unspecified upper limb: Secondary | ICD-10-CM | POA: Insufficient documentation

## 2012-02-22 DIAGNOSIS — N189 Chronic kidney disease, unspecified: Secondary | ICD-10-CM | POA: Insufficient documentation

## 2012-02-22 DIAGNOSIS — I2510 Atherosclerotic heart disease of native coronary artery without angina pectoris: Secondary | ICD-10-CM | POA: Insufficient documentation

## 2012-02-22 DIAGNOSIS — Z794 Long term (current) use of insulin: Secondary | ICD-10-CM | POA: Insufficient documentation

## 2012-02-22 DIAGNOSIS — M2420 Disorder of ligament, unspecified site: Secondary | ICD-10-CM | POA: Insufficient documentation

## 2012-02-22 DIAGNOSIS — E119 Type 2 diabetes mellitus without complications: Secondary | ICD-10-CM | POA: Insufficient documentation

## 2012-02-22 DIAGNOSIS — Z01812 Encounter for preprocedural laboratory examination: Secondary | ICD-10-CM | POA: Insufficient documentation

## 2012-02-22 DIAGNOSIS — E785 Hyperlipidemia, unspecified: Secondary | ICD-10-CM | POA: Insufficient documentation

## 2012-02-22 DIAGNOSIS — F411 Generalized anxiety disorder: Secondary | ICD-10-CM | POA: Insufficient documentation

## 2012-02-22 DIAGNOSIS — G5610 Other lesions of median nerve, unspecified upper limb: Secondary | ICD-10-CM | POA: Insufficient documentation

## 2012-02-22 DIAGNOSIS — Z7982 Long term (current) use of aspirin: Secondary | ICD-10-CM | POA: Insufficient documentation

## 2012-02-22 DIAGNOSIS — M629 Disorder of muscle, unspecified: Secondary | ICD-10-CM | POA: Insufficient documentation

## 2012-02-22 DIAGNOSIS — Z823 Family history of stroke: Secondary | ICD-10-CM | POA: Insufficient documentation

## 2012-02-22 DIAGNOSIS — Z8249 Family history of ischemic heart disease and other diseases of the circulatory system: Secondary | ICD-10-CM | POA: Insufficient documentation

## 2012-02-22 DIAGNOSIS — I129 Hypertensive chronic kidney disease with stage 1 through stage 4 chronic kidney disease, or unspecified chronic kidney disease: Secondary | ICD-10-CM | POA: Insufficient documentation

## 2012-02-22 DIAGNOSIS — I2589 Other forms of chronic ischemic heart disease: Secondary | ICD-10-CM | POA: Insufficient documentation

## 2012-02-22 DIAGNOSIS — E079 Disorder of thyroid, unspecified: Secondary | ICD-10-CM | POA: Insufficient documentation

## 2012-02-22 DIAGNOSIS — Z833 Family history of diabetes mellitus: Secondary | ICD-10-CM | POA: Insufficient documentation

## 2012-02-22 HISTORY — PX: CARPAL TUNNEL WITH CUBITAL TUNNEL: SHX5608

## 2012-02-22 LAB — GLUCOSE, CAPILLARY: Glucose-Capillary: 238 mg/dL — ABNORMAL HIGH (ref 70–99)

## 2012-02-22 SURGERY — RELEASE, CARPAL TUNNEL AND CUBITAL TUNNEL
Anesthesia: General | Site: Wrist | Laterality: Right | Wound class: Clean

## 2012-02-22 MED ORDER — LACTATED RINGERS IV SOLN
INTRAVENOUS | Status: DC | PRN
  Administered 2012-02-22: 15:00:00 via INTRAVENOUS

## 2012-02-22 MED ORDER — PROMETHAZINE HCL 25 MG/ML IJ SOLN: 6.2500 mg | INTRAMUSCULAR | Status: DC | PRN

## 2012-02-22 MED ORDER — OXYCODONE-ACETAMINOPHEN 5-325 MG PO TABS: 2.0000 | ORAL_TABLET | ORAL | Status: DC | PRN

## 2012-02-22 MED ORDER — BUPIVACAINE HCL 0.25 % IJ SOLN
INTRAMUSCULAR | Status: DC | PRN
  Administered 2012-02-22: 5 mL

## 2012-02-22 MED ORDER — MIDAZOLAM HCL 5 MG/5ML IJ SOLN
INTRAMUSCULAR | Status: DC | PRN
  Administered 2012-02-22: 1 mg via INTRAVENOUS

## 2012-02-22 MED ORDER — ROPIVACAINE HCL 5 MG/ML IJ SOLN
INTRAMUSCULAR | Status: DC | PRN
  Administered 2012-02-22: 150 mg via EPIDURAL

## 2012-02-22 MED ORDER — PROPOFOL INFUSION 10 MG/ML OPTIME
INTRAVENOUS | Status: DC | PRN
  Administered 2012-02-22: 75 ug/kg/min via INTRAVENOUS

## 2012-02-22 MED ORDER — BUPIVACAINE HCL (PF) 0.25 % IJ SOLN
INTRAMUSCULAR | Status: AC
  Filled 2012-02-22: qty 30

## 2012-02-22 MED ORDER — ONDANSETRON HCL 4 MG/2ML IJ SOLN
INTRAMUSCULAR | Status: DC | PRN
  Administered 2012-02-22: 4 mg via INTRAVENOUS

## 2012-02-22 MED ORDER — LACTATED RINGERS IV SOLN
INTRAVENOUS | Status: DC
  Administered 2012-02-22: 15:00:00 via INTRAVENOUS

## 2012-02-22 MED ORDER — FENTANYL CITRATE 0.05 MG/ML IJ SOLN
INTRAMUSCULAR | Status: DC | PRN
  Administered 2012-02-22: 50 ug via INTRAVENOUS

## 2012-02-22 MED ORDER — FENTANYL CITRATE 0.05 MG/ML IJ SOLN
100.0000 ug | Freq: Once | INTRAMUSCULAR | Status: AC
  Administered 2012-02-22: 100 ug via INTRAVENOUS

## 2012-02-22 MED ORDER — MIDAZOLAM HCL 2 MG/2ML IJ SOLN
INTRAMUSCULAR | Status: AC
  Filled 2012-02-22: qty 2

## 2012-02-22 MED ORDER — HYDROMORPHONE HCL PF 1 MG/ML IJ SOLN: 0.2500 mg | INTRAMUSCULAR | Status: DC | PRN

## 2012-02-22 MED ORDER — BUPIVACAINE-EPINEPHRINE 0.25% -1:200000 IJ SOLN
INTRAMUSCULAR | Status: DC | PRN
  Administered 2012-02-22: 15 mL

## 2012-02-22 MED ORDER — DEXAMETHASONE SODIUM PHOSPHATE 4 MG/ML IJ SOLN
INTRAMUSCULAR | Status: DC | PRN
  Administered 2012-02-22: 10 mg

## 2012-02-22 MED ORDER — SODIUM CHLORIDE 0.45 % IV SOLN: INTRAVENOUS | Status: DC

## 2012-02-22 MED ORDER — FENTANYL CITRATE 0.05 MG/ML IJ SOLN
INTRAMUSCULAR | Status: AC
  Filled 2012-02-22: qty 2

## 2012-02-22 MED ORDER — MIDAZOLAM HCL 5 MG/ML IJ SOLN
2.0000 mg | Freq: Once | INTRAMUSCULAR | Status: AC
  Administered 2012-02-22: 2 mg via INTRAVENOUS

## 2012-02-22 MED ORDER — OXYCODONE HCL 5 MG/5ML PO SOLN: 5.0000 mg | Freq: Once | ORAL | Status: DC | PRN

## 2012-02-22 MED ORDER — BUPIVACAINE-EPINEPHRINE PF 0.25-1:200000 % IJ SOLN
INTRAMUSCULAR | Status: AC
  Filled 2012-02-22: qty 30

## 2012-02-22 MED ORDER — OXYCODONE HCL 5 MG PO TABS: 5.0000 mg | ORAL_TABLET | Freq: Once | ORAL | Status: DC | PRN

## 2012-02-22 MED ORDER — LIDOCAINE HCL (CARDIAC) 20 MG/ML IV SOLN
INTRAVENOUS | Status: DC | PRN
  Administered 2012-02-22: 50 mg via INTRAVENOUS

## 2012-02-22 SURGICAL SUPPLY — 47 items
BANDAGE ELASTIC 3 VELCRO ST LF (GAUZE/BANDAGES/DRESSINGS) ×3 IMPLANT
BANDAGE ELASTIC 4 VELCRO ST LF (GAUZE/BANDAGES/DRESSINGS) ×2 IMPLANT
BANDAGE GAUZE ELAST BULKY 4 IN (GAUZE/BANDAGES/DRESSINGS) ×2 IMPLANT
CLOTH BEACON ORANGE TIMEOUT ST (SAFETY) ×2 IMPLANT
CORDS BIPOLAR (ELECTRODE) ×2 IMPLANT
COVER SURGICAL LIGHT HANDLE (MISCELLANEOUS) ×2 IMPLANT
CUFF TOURNIQUET SINGLE 18IN (TOURNIQUET CUFF) ×2 IMPLANT
CUFF TOURNIQUET SINGLE 24IN (TOURNIQUET CUFF) IMPLANT
DRAPE SURG 17X23 STRL (DRAPES) ×2 IMPLANT
DRSG ADAPTIC 3X8 NADH LF (GAUZE/BANDAGES/DRESSINGS) ×1 IMPLANT
EVACUATOR 1/8 PVC DRAIN (DRAIN) IMPLANT
GAUZE XEROFORM 1X8 LF (GAUZE/BANDAGES/DRESSINGS) ×2 IMPLANT
GLOVE BIOGEL M STRL SZ7.5 (GLOVE) ×2 IMPLANT
GLOVE SS BIOGEL STRL SZ 8 (GLOVE) ×1 IMPLANT
GLOVE SUPERSENSE BIOGEL SZ 8 (GLOVE) ×1
GOWN PREVENTION PLUS XLARGE (GOWN DISPOSABLE) ×2 IMPLANT
GOWN STRL NON-REIN LRG LVL3 (GOWN DISPOSABLE) ×4 IMPLANT
GOWN STRL REIN XL XLG (GOWN DISPOSABLE) ×4 IMPLANT
KIT BASIN OR (CUSTOM PROCEDURE TRAY) ×2 IMPLANT
KIT ROOM TURNOVER OR (KITS) ×2 IMPLANT
LOOP VESSEL MAXI BLUE (MISCELLANEOUS) IMPLANT
NDL HYPO 25GX1X1/2 BEV (NEEDLE) IMPLANT
NEEDLE HYPO 25GX1X1/2 BEV (NEEDLE) ×2 IMPLANT
NS IRRIG 1000ML POUR BTL (IV SOLUTION) ×2 IMPLANT
PACK ORTHO EXTREMITY (CUSTOM PROCEDURE TRAY) ×2 IMPLANT
PAD ARMBOARD 7.5X6 YLW CONV (MISCELLANEOUS) ×4 IMPLANT
PAD CAST 3X4 CTTN HI CHSV (CAST SUPPLIES) IMPLANT
PAD CAST 4YDX4 CTTN HI CHSV (CAST SUPPLIES) ×2 IMPLANT
PADDING CAST COTTON 3X4 STRL (CAST SUPPLIES) ×2
PADDING CAST COTTON 4X4 STRL (CAST SUPPLIES) ×2
SOLUTION BETADINE 4OZ (MISCELLANEOUS) ×2 IMPLANT
SPONGE GAUZE 4X4 12PLY (GAUZE/BANDAGES/DRESSINGS) ×2 IMPLANT
SPONGE SCRUB IODOPHOR (GAUZE/BANDAGES/DRESSINGS) ×2 IMPLANT
STOCKINETTE TUBULAR SYNTH 4IN (CAST SUPPLIES) ×1 IMPLANT
SUCTION FRAZIER TIP 10 FR DISP (SUCTIONS) ×1 IMPLANT
SUT PROLENE 4 0 PS 2 18 (SUTURE) ×2 IMPLANT
SUT VIC AB 2-0 CT1 27 (SUTURE) ×2
SUT VIC AB 2-0 CT1 TAPERPNT 27 (SUTURE) IMPLANT
SUT VIC AB 3-0 FS2 27 (SUTURE) ×1 IMPLANT
SYR CONTROL 10ML LL (SYRINGE) ×1 IMPLANT
SYSTEM CHEST DRAIN TLS 7FR (DRAIN) IMPLANT
TOWEL OR 17X24 6PK STRL BLUE (TOWEL DISPOSABLE) ×2 IMPLANT
TOWEL OR 17X26 10 PK STRL BLUE (TOWEL DISPOSABLE) ×2 IMPLANT
TUBE CONNECTING 12X1/4 (SUCTIONS) ×1 IMPLANT
TUBE EVACUATION TLS (MISCELLANEOUS) ×2 IMPLANT
UNDERPAD 30X30 INCONTINENT (UNDERPADS AND DIAPERS) ×2 IMPLANT
WATER STERILE IRR 1000ML POUR (IV SOLUTION) ×2 IMPLANT

## 2012-02-22 NOTE — Preoperative (Signed)
Beta Blockers   Reason not to administer Beta Blockers:Not Applicable 

## 2012-02-22 NOTE — Transfer of Care (Signed)
Immediate Anesthesia Transfer of Care Note  Patient: Matthew [name]  Procedure(s) Performed: Procedure(s) (LRB) with comments: CARPAL TUNNEL WITH CUBITAL TUNNEL (Right) - Right Carpal Tunnel Release/Right Cubital Tunnel Release and Transposition if Necessary with Flexor Pronator Release  Patient Location: PACU  Anesthesia Type:MAC and Regional  Level of Consciousness: awake, alert  and oriented  Airway & Oxygen Therapy: Patient Spontanous Breathing and Patient connected to nasal cannula oxygen  Post-op Assessment: Report given to PACU RN, Post -op Vital signs reviewed and stable and Patient moving all extremities X 4  Post vital signs: Reviewed and stable  Complications: No apparent anesthesia complications

## 2012-02-22 NOTE — Op Note (Signed)
Dict # [id number] Dominica Severin Md

## 2012-02-22 NOTE — Anesthesia Preprocedure Evaluation (Signed)
Anesthesia Evaluation    Reviewed: Allergy & Precautions, H&P , NPO status , Patient's Chart, lab work & pertinent test results  Airway       Dental   Pulmonary sleep apnea ,          Cardiovascular hypertension, Pt. on medications + CAD and + CABG + dysrhythmias Ventricular Fibrillation     Neuro/Psych PSYCHIATRIC DISORDERS Anxiety Depression    GI/Hepatic negative GI ROS, Neg liver ROS,   Endo/Other  diabetes, Type obesity  Renal/GU Renal InsufficiencyRenal disease     Musculoskeletal   Abdominal   Peds  Hematology   Anesthesia Other Findings   Reproductive/Obstetrics                           Anesthesia Physical Anesthesia Plan  ASA: III  Anesthesia Plan: MAC   Post-op Pain Management:    Induction:   Airway Management Planned:   Additional Equipment:   Intra-op Plan:   Post-operative Plan:   Informed Consent:   Plan Discussed with:   Anesthesia Plan Comments:         Anesthesia Quick Evaluation

## 2012-02-22 NOTE — H&P (Signed)
Matthew [name] is an 46 y.o. male.   Chief Complaint:Presents for R CTR and CUTR HPI: Marland KitchenMarland KitchenPatient presents for evaluation and treatment of the of their upper extremity predicament. The patient denies neck back chest or of abdominal pain. The patient notes that they have no lower extremity problems. The patient from primarily complains of the upper extremity pain noted.  Past Medical History  Diagnosis Date  . Cardiac arrest - ventricular fibrillation 08/26/2009  . CAD (coronary artery disease)     a. OOH MI 6/11; presented with acute CHF; hosp course c/b VF arrest with VDRF, etc;   b s/p CABG: L-LAD/Dx, S-OM/dCFX;  c.Nuclear scan 8/12:  Inferior and inf-lat scar with minimal peri-infarct ischemia, EF 53%.; d.LHC 9/12:  LAD 90%, pD1 (small) 70%, prox large Dx 95%, L-LAD and Dx ok, CFX occluded, S-OM and CFX ok, RCA prox to prox/mid 60-70%.  Medical management     . Ischemic cardiomyopathy     echo 10/11:  inf-septal and apical HK, EF 35%, mod LAE  . CKD (chronic kidney disease)   . Diabetic coma with ketoacidosis     Occured in 1994 where he spent 30 days in a coma . He develpoed infection of his leg and developing pancreatitis during that  time   . DM2 (diabetes mellitus, type 2)   . Cellulitis 2012    spider bite  . HTN (hypertension)   . HLD (hyperlipidemia)   . Chronic systolic heart failure   . Thyroid disease   . Coma   . Headache     sinus  . Anxiety     stress    Past Surgical History  Procedure Date  . Coronary artery bypass grafting x4 june 29,2011    x 4  . Tonsillectomy 1998  . Appendectomy mid 1990  . Right sided abdominal cyst removal     Family History  Problem Relation Age of Onset  . Cancer Maternal Grandmother   . Allergies Mother   . Diabetes Mother   . Hypertension Mother   . Hyperlipidemia Mother   . Stroke Father   . Diabetes Sister   . Diabetes Sister   . Allergies Father   . Allergies Sister    Social History:  reports that he has never smoked.  His smokeless tobacco use includes Snuff. He reports that he drinks alcohol. He reports that he does not use illicit drugs.  Allergies:  Allergies  Allergen Reactions  . Ace Inhibitors Cough    Medications Prior to Admission  Medication Sig Dispense Refill  . aspirin 325 MG tablet Take 325 mg by mouth daily.        . busPIRone (BUSPAR) 5 MG tablet Take 5 mg by mouth 2 (two) times daily.      . Chromium Picolinate 200 MCG CAPS Take 1 capsule by mouth daily.        . Cinnamon 500 MG TABS Take 2 tablets by mouth daily.        Marland Kitchen gabapentin (NEURONTIN) 300 MG capsule Take 300 mg by mouth At bedtime.      . hydrochlorothiazide (HYDRODIURIL) 25 MG tablet Take 1 tablet (25 mg total) by mouth daily.  90 tablet  3  . insulin glargine (LANTUS SOLOSTAR) 100 UNIT/ML injection Inject 60 Units into the skin at bedtime.  10 mL  12  . insulin lispro (HUMALOG) 100 UNIT/ML injection Inject 20 Units into the skin 3 (three) times daily before meals.       Marland Kitchen  isosorbide mononitrate (IMDUR) 30 MG 24 hr tablet Take 1 tablet (30 mg total) by mouth daily.  30 tablet  11  . LORazepam (ATIVAN) 1 MG tablet Take 1 tablet (1 mg total) by mouth 2 (two) times daily as needed for anxiety.  45 tablet  1  . losartan (COZAAR) 50 MG tablet Take 1 tablet (50 mg total) by mouth daily.  30 tablet  3  . metoprolol tartrate (LOPRESSOR) 25 MG tablet Take 1 tablet (25 mg total) by mouth 2 (two) times daily.  60 tablet  11  . Multiple Vitamin (MULTIVITAMIN) tablet Take 1 tablet by mouth daily.        . nitroGLYCERIN (NITROSTAT) 0.4 MG SL tablet Place 1 tablet (0.4 mg total) under the tongue every 5 (five) minutes as needed for chest pain.  25 tablet  11  . Omega-3 Fatty Acids (FISH OIL BURP-LESS PO) Take 1 tablet by mouth daily.        . rosuvastatin (CRESTOR) 10 MG tablet Take 1 tablet (10 mg total) by mouth daily.  42 tablet  0  . venlafaxine XR (EFFEXOR-XR) 75 MG 24 hr capsule Dose increase effective 09/18/2011  30 capsule  3  .  zolpidem (AMBIEN) 10 MG tablet Take 1 tablet (10 mg total) by mouth at bedtime as needed for sleep.  30 tablet  2    Results for orders placed during the hospital encounter of 02/22/12 (from the past 48 hour(s))  GLUCOSE, CAPILLARY     Status: Abnormal   Collection Time   02/22/12  1:49 PM      Component Value Range Comment   Glucose-Capillary 268 (*) 70 - 99 mg/dL   GLUCOSE, CAPILLARY     Status: Abnormal   Collection Time   02/22/12  2:52 PM      Component Value Range Comment   Glucose-Capillary 246 (*) 70 - 99 mg/dL    No results found.  Review of Systems  Constitutional: Negative.   HENT: Negative.   Respiratory: Negative.   Cardiovascular: Negative.   Gastrointestinal: Negative.   Skin: Negative.   Neurological: Negative.   Psychiatric/Behavioral: Negative.     Blood pressure 133/79, pulse 75, temperature 98.3 F (36.8 C), temperature source Oral, resp. rate 16, SpO2 100.00%. Physical Exam..The patient is alert and oriented in no acute distress the patient complains of pain in the affected upper extremity. The patient is noted to have a normal HEENT exam. Lung fields show equal chest expansion and no shortness of breath abdomen exam is nontender without distention. Lower extremity examination does not show any fracture dislocation or blood clot symptoms. Pelvis is stable neck and back are stable and nontender  Presents for CTR and CUTR rt due to chronic compression  Assessment/Plan .Marland KitchenWe are planning surgery for your upper extremity. The risk and benefits of surgery include risk of bleeding infection anesthesia damage to normal structures and failure of the surgery to accomplish its intended goals of relieving symptoms and restoring function with this in mind we'll going to proceed. I have specifically discussed with the patient the pre-and postoperative regime and the does and don'ts and risk and benefits in great detail. Risk and benefits of surgery also include risk of  dystrophy chronic nerve pain failure of the healing process to go onto completion and other inherent risks of surgery The relavent the pathophysiology of the disease/injury process, as well as the alternatives for treatment and postoperative course of action has been discussed in great detail with  the patient who desires to proceed.  We will do everything in our power to help you (the patient) restore function to the upper extremity. Is a pleasure to see this patient today.   Karen Chafe 02/22/2012, 3:23 PM

## 2012-02-22 NOTE — Anesthesia Procedure Notes (Signed)
Anesthesia Regional Block:  Supraclavicular block  Pre-Anesthetic Checklist: ,, timeout performed, Correct Patient, Correct Site, Correct Laterality, Correct Procedure, Correct Position, site marked, Risks and benefits discussed,  Surgical consent,  Pre-op evaluation,  At surgeon's request and post-op pain management  Laterality: Right  Prep: chloraprep       Needles:  Injection technique: Single-shot  Needle Type: Echogenic Stimulator Needle     Needle Length: 5cm 5 cm Needle Gauge: 22 and 22 G    Additional Needles:  Procedures: ultrasound guided (picture in chart) and nerve stimulator Supraclavicular block  Nerve Stimulator or Paresthesia:  Response: bicep contraction, 0.45 mA,   Additional Responses:   Narrative:  Start time: 02/22/2012 3:03 PM End time: 02/22/2012 3:13 PM Injection made incrementally with aspirations every 5 mL.  Performed by: Personally  Anesthesiologist: J. Adonis Huguenin, MD  Additional Notes: Functioning IV was confirmed and monitors applied.  A 50mm 22ga echogenic arrow stimulator was used. Sterile prep and drape,hand hygiene and sterile gloves were used.Ultrasound guidance: relevant anatomy identified, needle position confirmed, local anesthetic spread visualized around nerve(s)., vascular puncture avoided.  Image printed for medical record.  Negative aspiration and negative test dose prior to incremental administration of local anesthetic. The patient tolerated the procedure well.  Interscalene brachial plexus block

## 2012-02-22 NOTE — Progress Notes (Signed)
Pt alert and oriented blood sugar drawn 238 . It was 246 pre op., up to bathroom with assistance voided , feels no pain right arm remains numb able to move fingers and feel sensation,hand warm and color pink Instructions given to patient and mother using teach back

## 2012-02-22 NOTE — Anesthesia Postprocedure Evaluation (Signed)
  Anesthesia Post-op Note  Patient: Matthew [name]  Procedure(s) Performed: Procedure(s) (LRB) with comments: CARPAL TUNNEL WITH CUBITAL TUNNEL (Right) - Right Carpal Tunnel Release/Right Cubital Tunnel Release and Transposition if Necessary with Flexor Pronator Release  Patient Location: PACU  Anesthesia Type:General  Level of Consciousness: awake  Airway and Oxygen Therapy: Patient Spontanous Breathing  Post-op Pain: mild  Post-op Assessment: Post-op Vital signs reviewed  Post-op Vital Signs: Reviewed  Complications: No apparent anesthesia complications

## 2012-02-23 NOTE — Op Note (Signed)
Matthew [name], Matthew [name]               ACCOUNT NO.:  [id number]  MEDICAL RECORD NO.:  [id number]  LOCATION:                                 FACILITY:  PHYSICIAN:  Dionne Ano. Donovan Gatchel, M.D.DATE OF BIRTH:  14-May-1965  DATE OF PROCEDURE: DATE OF DISCHARGE:                              OPERATIVE REPORT   PREOPERATIVE DIAGNOSES: 1. Carpal tunnel syndrome, right upper extremity. 2. Cubital tunnel syndrome, right upper extremity.  POSTOPERATIVE DIAGNOSES: 1. Carpal tunnel syndrome, right upper extremity. 2. Cubital tunnel syndrome, right upper extremity.  PROCEDURE: 1. Right limited open carpal tunnel release. 2. Right cubital tunnel release (ulnar nerve decompression at the     elbow with resection of an anconeus epitrochlearis.  SURGEON:  Dominica Severin, M.D.  ASSISTANT:  Karie Chimera, P.A.-C.  COMPLICATION:  None.  ANESTHESIA:  Block with IV sedation.  INDICATIONS:  This is a pleasant male, 46 years of age who presents with the above-mentioned diagnosis.  I have counseled in regard to risks and benefits of surgery including risks of infection, bleeding, anesthesia, damage to normal structures, and failure of surgery to accomplish its intended goals of relieving symptoms and restoring function.  With this in mind, the patient desires to proceed.  I have discussed with him the diabetic neuropathy, multiple medical problems, and other issues contributing to his upper extremity predicament.  He understands this as a reasonable expectation of the surgery goals, findings, etc.  OPERATIVE PROCEDURE:  The patient was seen by myself and Anesthesia, and Dr. Krista Blue presided over block anesthesia.  Following this, he was taken to the operative suite and underwent a monitored anesthesia care approach for sedation and was prepped and draped in usual sterile fashion.  This was over seen by myself and Mr. Wynona Neat.  Following this he had a final time-out called.  The arm was elevated and  tourniquet was insufflated to 250 mmHg.  A 1-cm incision was made __________ transcarpal ligament.  Dissection was carried down.  Palmar fascia incised.  __________ transcarpal ligament release under 4.0 loupe magnification.  Following this, distal proximal dissection was carried out until adequate room was available for __________ device followed by sliding blunt tip scissors to the remaining leaflet of transverse carpal ligament proximally and in the portions of the antebrachial fascia.  This was all done under direct vision with 4.0 loupe magnification.  The patient tolerated this well.  He was completely released from median nerve hyperemic intact.  There were no complicating features.  Following this, I then performed very careful and cautious approach to the elbow.  Posterior medial incision was made.  Dissection was carried out and following this, I released the arcade of Struthers, medial intermuscular septum, Osbornes ligament, 2 heads of the FCU antecubital tunnel.  He had an anconeus epitrochlearis, so I went ahead and resected the anconeus.  Following this, he was placed through a range of motion. Nerve subluxation tendencies looked excellent.  I was quite pleased with this in the findings.  Following this, tourniquet was deflated.  Hemostasis was secured with bipolar electrocautery.  Wounds were closed with Prolene.  Sensorcaine was placed __________ postop analgesia as he did have some incomplete features to  his block.  I used 15 mL with epinephrine of Sensorcaine at the elbow and a 5 mL Sensorcaine without epinephrine in the wrist.  He tolerated the procedure well.  Sterile dressing was applied.  We are going to see him back in the office in 12-14 days.  Do's and do not's have been discussed and all questions have been encouraged and answered.  This was a noncomplicated carpal and cubital tunnel release performed at the wrist and elbow region respectively without  difficulty.     Dionne Ano. Amanda Pea, M.D.     [facility]  D:  02/22/2012  T:  02/23/2012  Job:  [id number]

## 2012-02-26 ENCOUNTER — Encounter (HOSPITAL_COMMUNITY): Payer: Self-pay | Admitting: Orthopedic Surgery

## 2012-03-08 ENCOUNTER — Ambulatory Visit (HOSPITAL_BASED_OUTPATIENT_CLINIC_OR_DEPARTMENT_OTHER): Payer: 59

## 2012-03-15 ENCOUNTER — Ambulatory Visit (HOSPITAL_COMMUNITY): Payer: 59 | Admitting: Psychiatry

## 2012-03-18 ENCOUNTER — Ambulatory Visit (HOSPITAL_COMMUNITY): Payer: 59 | Admitting: Psychiatry

## 2012-03-22 ENCOUNTER — Telehealth: Payer: Self-pay | Admitting: Family Medicine

## 2012-03-22 MED ORDER — OSELTAMIVIR PHOSPHATE 75 MG PO CAPS: 75.0000 mg | ORAL_CAPSULE | Freq: Two times a day (BID) | ORAL | Status: AC

## 2012-03-22 NOTE — Telephone Encounter (Signed)
C/o sore throat, weakness, cough/congestion producing phlegm (unsure of the color) chills and bodyaches. Was exposed to the flu by his parents who have both been sick recently. Wants to know if he can have some tamiflu sent in  [facility] -wants a call back

## 2012-03-22 NOTE — Telephone Encounter (Signed)
Tamiflu called in

## 2012-03-22 NOTE — Telephone Encounter (Signed)
Spoke with pt he will pick up meds if able to afford. Given red flags for er this weekend, no SOB currently, parents have flu

## 2012-04-02 ENCOUNTER — Encounter: Payer: Self-pay | Admitting: Internal Medicine

## 2012-04-04 ENCOUNTER — Other Ambulatory Visit: Payer: Self-pay | Admitting: Family Medicine

## 2012-04-05 ENCOUNTER — Ambulatory Visit (HOSPITAL_BASED_OUTPATIENT_CLINIC_OR_DEPARTMENT_OTHER): Payer: 59

## 2012-04-25 ENCOUNTER — Other Ambulatory Visit: Payer: Self-pay | Admitting: Internal Medicine

## 2012-04-25 ENCOUNTER — Ambulatory Visit (INDEPENDENT_AMBULATORY_CARE_PROVIDER_SITE_OTHER): Payer: 59 | Admitting: *Deleted

## 2012-04-25 DIAGNOSIS — I4729 Other ventricular tachycardia: Secondary | ICD-10-CM

## 2012-04-25 DIAGNOSIS — I472 Ventricular tachycardia: Secondary | ICD-10-CM

## 2012-04-25 DIAGNOSIS — Z95810 Presence of automatic (implantable) cardiac defibrillator: Secondary | ICD-10-CM

## 2012-04-29 LAB — REMOTE ICD DEVICE
AL IMPEDENCE ICD: 501 Ohm
CHARGE TIME: 8.7 s
HV IMPEDENCE: 53 Ohm
RV LEAD AMPLITUDE: 14.3 mv
TZAT-0001FASTVT: 1
TZAT-0013FASTVT: 2
TZAT-0018FASTVT: NEGATIVE
TZST-0001FASTVT: 3
TZST-0003FASTVT: 41 J
TZST-0003FASTVT: 41 J
VENTRICULAR PACING ICD: 0 pct
VF: 0

## 2012-05-15 ENCOUNTER — Encounter: Payer: Self-pay | Admitting: *Deleted

## 2012-05-16 ENCOUNTER — Encounter: Payer: Self-pay | Admitting: Internal Medicine

## 2012-06-14 ENCOUNTER — Other Ambulatory Visit: Payer: Self-pay | Admitting: Family Medicine

## 2012-06-14 ENCOUNTER — Telehealth: Payer: Self-pay | Admitting: Family Medicine

## 2012-06-14 MED ORDER — PRAVASTATIN SODIUM 80 MG PO TABS: 80.0000 mg | ORAL_TABLET | Freq: Every day | ORAL | Status: DC

## 2012-06-14 NOTE — Telephone Encounter (Signed)
Patient aware.

## 2012-06-14 NOTE — Telephone Encounter (Signed)
30 day supply only of pravastatin 80mg  written , pls send after you spk with him

## 2012-09-16 ENCOUNTER — Emergency Department (HOSPITAL_COMMUNITY): Payer: PRIVATE HEALTH INSURANCE

## 2012-09-16 ENCOUNTER — Encounter (HOSPITAL_COMMUNITY): Payer: Self-pay | Admitting: *Deleted

## 2012-09-16 ENCOUNTER — Observation Stay (HOSPITAL_COMMUNITY)
Admission: EM | Admit: 2012-09-16 | Discharge: 2012-09-17 | Disposition: A | Discharge status: Home / Self Care | Payer: PRIVATE HEALTH INSURANCE | Attending: Family Medicine | Admitting: Family Medicine

## 2012-09-16 DIAGNOSIS — I5022 Chronic systolic (congestive) heart failure: Secondary | ICD-10-CM

## 2012-09-16 DIAGNOSIS — I2589 Other forms of chronic ischemic heart disease: Secondary | ICD-10-CM | POA: Insufficient documentation

## 2012-09-16 DIAGNOSIS — I10 Essential (primary) hypertension: Secondary | ICD-10-CM | POA: Diagnosis present

## 2012-09-16 DIAGNOSIS — I2510 Atherosclerotic heart disease of native coronary artery without angina pectoris: Secondary | ICD-10-CM | POA: Diagnosis present

## 2012-09-16 DIAGNOSIS — Z951 Presence of aortocoronary bypass graft: Secondary | ICD-10-CM | POA: Insufficient documentation

## 2012-09-16 DIAGNOSIS — IMO0001 Reserved for inherently not codable concepts without codable children: Secondary | ICD-10-CM | POA: Diagnosis present

## 2012-09-16 DIAGNOSIS — E785 Hyperlipidemia, unspecified: Secondary | ICD-10-CM | POA: Insufficient documentation

## 2012-09-16 DIAGNOSIS — I509 Heart failure, unspecified: Secondary | ICD-10-CM | POA: Diagnosis present

## 2012-09-16 DIAGNOSIS — R079 Chest pain, unspecified: Principal | ICD-10-CM | POA: Diagnosis present

## 2012-09-16 DIAGNOSIS — Z95810 Presence of automatic (implantable) cardiac defibrillator: Secondary | ICD-10-CM | POA: Diagnosis present

## 2012-09-16 DIAGNOSIS — E119 Type 2 diabetes mellitus without complications: Secondary | ICD-10-CM | POA: Insufficient documentation

## 2012-09-16 LAB — BASIC METABOLIC PANEL
CO2: 29 mEq/L (ref 19–32)
Calcium: 9.5 mg/dL (ref 8.4–10.5)
Chloride: 95 mEq/L — ABNORMAL LOW (ref 96–112)
GFR calc Af Amer: 79 mL/min — ABNORMAL LOW (ref >90)
Sodium: 133 mEq/L — ABNORMAL LOW (ref 135–145)

## 2012-09-16 LAB — TROPONIN I
Troponin I: <0.3 ng/mL (ref <0.30)
Troponin I: <0.3 ng/mL (ref <0.30)

## 2012-09-16 LAB — GLUCOSE, CAPILLARY
Glucose-Capillary: 265 mg/dL — ABNORMAL HIGH (ref 70–99)
Glucose-Capillary: 350 mg/dL — ABNORMAL HIGH (ref 70–99)

## 2012-09-16 LAB — CBC
MCV: 81.8 fL (ref 78.0–100.0)
Platelets: 134 10*3/uL — ABNORMAL LOW (ref 150–400)
RBC: 5 MIL/uL (ref 4.22–5.81)
WBC: 8.5 10*3/uL (ref 4.0–10.5)

## 2012-09-16 MED ORDER — SODIUM CHLORIDE 0.9 % IJ SOLN: 3.0000 mL | Freq: Two times a day (BID) | INTRAMUSCULAR | Status: DC

## 2012-09-16 MED ORDER — ACETAMINOPHEN 650 MG RE SUPP: 650.0000 mg | Freq: Four times a day (QID) | RECTAL | Status: DC | PRN

## 2012-09-16 MED ORDER — ASPIRIN 325 MG PO TABS
325.0000 mg | ORAL_TABLET | Freq: Every day | ORAL | Status: DC
  Administered 2012-09-17: 325 mg via ORAL
  Filled 2012-09-16: qty 1

## 2012-09-16 MED ORDER — INSULIN ASPART 100 UNIT/ML ~~LOC~~ SOLN
0.0000 [IU] | Freq: Three times a day (TID) | SUBCUTANEOUS | Status: DC
  Administered 2012-09-17: 7 [IU] via SUBCUTANEOUS
  Administered 2012-09-17: 5 [IU] via SUBCUTANEOUS

## 2012-09-16 MED ORDER — ACETAMINOPHEN 325 MG PO TABS: 650.0000 mg | ORAL_TABLET | Freq: Four times a day (QID) | ORAL | Status: DC | PRN

## 2012-09-16 MED ORDER — ONDANSETRON HCL 4 MG/2ML IJ SOLN: 4.0000 mg | Freq: Four times a day (QID) | INTRAMUSCULAR | Status: DC | PRN

## 2012-09-16 MED ORDER — SODIUM CHLORIDE 0.9 % IJ SOLN: 3.0000 mL | INTRAMUSCULAR | Status: DC | PRN

## 2012-09-16 MED ORDER — NITROGLYCERIN 0.4 MG SL SUBL
0.4000 mg | SUBLINGUAL_TABLET | SUBLINGUAL | Status: DC | PRN
Start: 2012-09-16 — End: 2012-09-17

## 2012-09-16 MED ORDER — SODIUM CHLORIDE 0.9 % IJ SOLN
3.0000 mL | Freq: Two times a day (BID) | INTRAMUSCULAR | Status: DC
  Administered 2012-09-16: 3 mL via INTRAVENOUS

## 2012-09-16 MED ORDER — METHYLCELLULOSE 1 % OP SOLN
1.0000 [drp] | OPHTHALMIC | Status: DC | PRN
  Filled 2012-09-16: qty 15

## 2012-09-16 MED ORDER — HYDROCHLOROTHIAZIDE 25 MG PO TABS
25.0000 mg | ORAL_TABLET | Freq: Every day | ORAL | Status: DC
  Administered 2012-09-17: 25 mg via ORAL
  Filled 2012-09-16: qty 1

## 2012-09-16 MED ORDER — ONDANSETRON HCL 4 MG PO TABS: 4.0000 mg | ORAL_TABLET | Freq: Four times a day (QID) | ORAL | Status: DC | PRN

## 2012-09-16 MED ORDER — MORPHINE SULFATE 2 MG/ML IJ SOLN: 2.0000 mg | INTRAMUSCULAR | Status: DC | PRN

## 2012-09-16 MED ORDER — BUSPIRONE HCL 5 MG PO TABS
5.0000 mg | ORAL_TABLET | Freq: Two times a day (BID) | ORAL | Status: DC
  Administered 2012-09-16 – 2012-09-17 (×2): 5 mg via ORAL
  Filled 2012-09-16 (×2): qty 1

## 2012-09-16 MED ORDER — GABAPENTIN 300 MG PO CAPS
300.0000 mg | ORAL_CAPSULE | Freq: Every day | ORAL | Status: DC
  Administered 2012-09-16: 300 mg via ORAL
  Filled 2012-09-16: qty 1

## 2012-09-16 MED ORDER — ALUM & MAG HYDROXIDE-SIMETH 200-200-20 MG/5ML PO SUSP: 30.0000 mL | Freq: Four times a day (QID) | ORAL | Status: DC | PRN

## 2012-09-16 MED ORDER — INSULIN GLARGINE 100 UNIT/ML ~~LOC~~ SOLN
60.0000 [IU] | Freq: Every day | SUBCUTANEOUS | Status: DC
  Administered 2012-09-16: 60 [IU] via SUBCUTANEOUS
  Filled 2012-09-16 (×2): qty 0.6

## 2012-09-16 MED ORDER — INSULIN ASPART 100 UNIT/ML ~~LOC~~ SOLN: 10.0000 [IU] | Freq: Three times a day (TID) | SUBCUTANEOUS | Status: DC

## 2012-09-16 MED ORDER — ATORVASTATIN CALCIUM 20 MG PO TABS: 20.0000 mg | ORAL_TABLET | Freq: Every day | ORAL | Status: DC

## 2012-09-16 MED ORDER — ENOXAPARIN SODIUM 40 MG/0.4ML ~~LOC~~ SOLN
40.0000 mg | SUBCUTANEOUS | Status: DC
  Administered 2012-09-16: 40 mg via SUBCUTANEOUS
  Filled 2012-09-16: qty 0.4

## 2012-09-16 MED ORDER — SODIUM CHLORIDE 0.9 % IV SOLN: 250.0000 mL | INTRAVENOUS | Status: DC | PRN

## 2012-09-16 MED ORDER — LOSARTAN POTASSIUM 50 MG PO TABS
50.0000 mg | ORAL_TABLET | Freq: Every day | ORAL | Status: DC
  Administered 2012-09-17: 50 mg via ORAL
  Filled 2012-09-16: qty 1

## 2012-09-16 MED ORDER — METOPROLOL TARTRATE 25 MG PO TABS
25.0000 mg | ORAL_TABLET | Freq: Two times a day (BID) | ORAL | Status: DC
  Administered 2012-09-16 – 2012-09-17 (×2): 25 mg via ORAL
  Filled 2012-09-16 (×2): qty 1

## 2012-09-16 NOTE — ED Provider Notes (Addendum)
History  This chart was scribed for Matthew Jakes, MD by Matthew [name], ED Scribe. This [name] was seen in room [facility]2/AP[facility]2 and Matthew [name] care was started at 12:43 PM.  CSN: [id number]  Arrival date & time 09/16/12  1125   First MD Initiated Contact with [name] 09/16/12 1243     Chief Complaint  [name] presents with  . Chest Pain    [name] is a 47 y.o. male presenting with chest pain. Matthew history is provided by Matthew [name]. No language interpreter was used.  Chest Pain Pain location:  L chest Pain quality: tightness   Pain radiates to:  L arm Pain radiates to Matthew back: no   Duration:  8 hours Progression:  Improving Chronicity:  New Context: at rest   Relieved by:  Nitroglycerin and aspirin Worsened by:  Nothing tried Associated symptoms: back pain (chronic), headache (from NTG), nausea and shortness of breath   Associated symptoms: no abdominal pain, no cough, no fever and not vomiting     HPI Comments: Matthew [name] is a 47 y.o. male who presents to [facility] Emergency Department complaining of left-sided CP described as tightness with associated paresthesia in Matthew left arm, SOB and nausea upon waking around 5 AM this morning. He denies having symptoms last night. He rates his tightness a 6 out of 10 at its worst and a 2 or 3 out of 10 currently. He states that he took 2 NTG with improvement and he reports states that Matthew nausea has since resolved. He denies that he has to take NTG frequently. He has a h/o CAD, CABG and defibrillator surgery 2 years ago. He denies having any stents. He takes 325 mg ASA daily and took on this morning.  Cardiologist is Dr. Ladona [name] with Matthew [name]   PCP is Dr. Lodema [name]  Past Medical History  Diagnosis Date  . Cardiac arrest - ventricular fibrillation 08/26/2009  . CAD (coronary artery disease)     a. OOH MI 6/11; presented with acute CHF; hosp course c/b VF arrest with VDRF, etc;   b s/p CABG: L-LAD/Dx, S-OM/dCFX;  c.Nuclear scan 8/12:   Inferior and inf-lat scar with minimal peri-infarct ischemia, EF 53%.; d.LHC 9/12:  LAD 90%, pD1 (small) 70%, prox large Dx 95%, L-LAD and Dx ok, CFX occluded, S-OM and CFX ok, RCA prox to prox/mid 60-70%.  Medical management     . Ischemic cardiomyopathy     echo 10/11:  inf-septal and apical HK, EF 35%, mod LAE  . Diabetic coma with ketoacidosis     Occured in 1994 where he spent 30 days in a coma . He develpoed infection of his leg and developing pancreatitis during that  time   . DM2 (diabetes mellitus, type 2)   . Cellulitis 2012    spider bite  . HTN (hypertension)   . HLD (hyperlipidemia)   . Chronic systolic heart failure   . Thyroid disease   . Coma   . Headache(784.0)     sinus  . Anxiety     stress  . CKD (chronic kidney disease)    Past Surgical History  Procedure Laterality Date  . Coronary artery bypass grafting x4  june 29,2011    x 4  . Tonsillectomy  1998  . Appendectomy  mid 1990  . Right sided abdominal cyst removal    . Carpal tunnel with cubital tunnel  02/22/2012    Procedure: CARPAL TUNNEL WITH CUBITAL TUNNEL;  Surgeon: Matthew Severin, MD;  Location: [facility]  OR;  Service: Orthopedics;  Laterality: Right;  Right Carpal Tunnel Release/Right Cubital Tunnel Release and Transposition if Necessary with Flexor Pronator Release  . Cardiac surgery     Family History  Problem Relation Age of Onset  . Cancer Maternal Grandmother   . Allergies Mother   . Diabetes Mother   . Hypertension Mother   . Hyperlipidemia Mother   . Stroke Father   . Diabetes Sister   . Diabetes Sister   . Allergies Father   . Allergies Sister    History  Substance Use Topics  . Smoking status: Never Smoker   . Smokeless tobacco: Current User    Types: Snuff  . Alcohol Use: Yes     Comment: rarely    Review of Systems  Constitutional: Negative for fever and chills.  HENT: Positive for neck pain (ongoing). Negative for congestion, sore throat and rhinorrhea.   Eyes: Positive for  visual disturbance (retinal hemorrhage in Matthew left eye).  Respiratory: Positive for shortness of breath. Negative for cough.   Cardiovascular: Positive for chest pain and leg swelling (ongoing).  Gastrointestinal: Positive for nausea and diarrhea (yesterday, >10 episodes ). Negative for vomiting and abdominal pain.  Genitourinary: Negative for dysuria.  Musculoskeletal: Positive for back pain (chronic).  Skin: Negative for rash.  Neurological: Positive for headaches (from NTG).  Hematological: Does not bruise/bleed easily.  Psychiatric/Behavioral: Negative for confusion.    Allergies  Ace inhibitors  Home Medications   Current Outpatient Rx  Name  Route  Sig  Dispense  Refill  . aspirin 325 MG tablet   Oral   Take 325 mg by mouth daily.           . busPIRone (BUSPAR) 5 MG tablet   Oral   Take 5 mg by mouth 2 (two) times daily.         . Chromium Picolinate 200 MCG CAPS   Oral   Take 200 mcg by mouth daily.          . Cinnamon 500 MG TABS   Oral   Take 1,000 mg by mouth daily.          Marland Kitchen gabapentin (NEURONTIN) 300 MG capsule   Oral   Take 300 mg by mouth At bedtime.         . hydrochlorothiazide (HYDRODIURIL) 25 MG tablet   Oral   Take 25 mg by mouth daily.         . insulin glargine (LANTUS SOLOSTAR) 100 UNIT/ML injection   Subcutaneous   Inject 60 Units into Matthew skin at bedtime.   10 mL   12     Dose increase effective 03/13/2011   . insulin lispro (HUMALOG) 100 UNIT/ML injection   Subcutaneous   Inject 20 Units into Matthew skin 3 (three) times daily before meals.          . Liraglutide (VICTOZA) 18 MG/3ML SOPN   Subcutaneous   Inject 1.2 mg into Matthew skin daily.         Marland Kitchen LORazepam (ATIVAN) 1 MG tablet   Oral   Take 1 mg by mouth daily.         Marland Kitchen losartan (COZAAR) 50 MG tablet   Oral   Take 1 tablet (50 mg total) by mouth daily.   30 tablet   3   . methylcellulose (ARTIFICIAL TEARS) 1 % ophthalmic solution   Both Eyes   Place 1  drop into both eyes 2 (two) times daily as needed (  dry eyes).         . metoprolol tartrate (LOPRESSOR) 25 MG tablet   Oral   Take 25 mg by mouth 2 (two) times daily.         . Multiple Vitamin (MULTIVITAMIN) tablet   Oral   Take 1 tablet by mouth daily.           . nitroGLYCERIN (NITROSTAT) 0.4 MG SL tablet   Sublingual   Place 0.4 mg under Matthew tongue every 5 (five) minutes as needed for chest pain.         . Omega-3 Fatty Acids (FISH OIL BURP-LESS PO)   Oral   Take 1 tablet by mouth daily.           . rosuvastatin (CRESTOR) 10 MG tablet   Oral   Take 1 tablet (10 mg total) by mouth daily.   42 tablet   0   . metoprolol tartrate (LOPRESSOR) 25 MG tablet      TAKE ONE TABLET BY MOUTH TWICE DAILY   60 tablet   10    Triage Vitals: BP 167/93  Pulse 93  Temp(Src) 98.6 F (37 C) (Oral)  Resp 22  SpO2 99%  Physical Exam  Nursing note and vitals reviewed. Constitutional: He is oriented to person, place, and time. He appears well-developed and well-nourished. No distress.  HENT:  Head: Normocephalic and atraumatic.  Mouth/Throat: Oropharynx is clear and moist.  Eyes: Conjunctivae and EOM are normal. Pupils are equal, round, and reactive to light.  Sclera are clear  Neck: Neck supple. No tracheal deviation present.  Cardiovascular: Normal rate and regular rhythm.   No murmur heard. Pulses:      Dorsalis pedis pulses are 2+ on Matthew right side, and 2+ on Matthew left side.  Pulmonary/Chest: Effort normal and breath sounds normal. No respiratory distress. He has no wheezes. He exhibits no tenderness.  Abdominal: Soft. Bowel sounds are normal. He exhibits no distension. There is no tenderness.  Musculoskeletal: Normal range of motion. He exhibits edema (trace edema to Matthew left, no edema on Matthew right).  Lymphadenopathy:    He has no cervical adenopathy.  Neurological: He is alert and oriented to person, place, and time. No cranial nerve deficit.  Pt able to move both  sets of fingers and toes  Skin: Skin is warm and dry. No rash noted.  Psychiatric: He has a normal mood and affect. His behavior is normal.    ED Course  Procedures (including critical care time)  DIAGNOSTIC STUDIES: Oxygen Saturation is 99% on room air, normal by my interpretation.    COORDINATION OF CARE: 1:14 PM-Discussed treatment plan which includes CXR, CBC panel, BMP and troponin with pt at bedside and pt agreed to plan. Advised pt that admission is probable.   Labs Reviewed  BASIC METABOLIC PANEL - Abnormal; Notable for Matthew following:    Sodium 133 (*)    Chloride 95 (*)    Glucose, Bld 463 (*)    BUN 26 (*)    GFR calc non Af Amer 69 (*)    GFR calc Af Amer 79 (*)    All other components within normal limits  CBC - Abnormal; Notable for Matthew following:    Platelets 134 (*)    All other components within normal limits  TROPONIN I   Dg Chest 2 View  09/16/2012   *RADIOLOGY REPORT*  Clinical Data: Chest pain and pressure, history MI  CHEST - 2 VIEW  Comparison: 10/30/2011  Findings: Left subclavian AICD leads project at Matthew right atrium and right ventricle. Enlargement of cardiac silhouette post CABG. Mediastinal contours and pulmonary vascularity normal. Lungs clear. No pleural effusion, pneumothorax or acute osseous findings.  IMPRESSION: Enlargement of cardiac silhouette post CABG and AICD. No acute abnormalities.   Original Report Authenticated By: Ulyses Southward, M.D.   Results for orders placed during Matthew hospital encounter of 09/16/12  TROPONIN I      Result Value Range   Troponin I <0.30  <0.30 ng/mL  BASIC METABOLIC PANEL      Result Value Range   Sodium 133 (*) 135 - 145 mEq/L   Potassium 4.1  3.5 - 5.1 mEq/L   Chloride 95 (*) 96 - 112 mEq/L   CO2 29  19 - 32 mEq/L   Glucose, Bld 463 (*) 70 - 99 mg/dL   BUN 26 (*) 6 - 23 mg/dL   Creatinine, Ser 1.91  0.50 - 1.35 mg/dL   Calcium 9.5  8.4 - 47.8 mg/dL   GFR calc non Af Amer 69 (*) >90 mL/min   GFR calc Af Amer  79 (*) >90 mL/min  CBC      Result Value Range   WBC 8.5  4.0 - 10.5 K/uL   RBC 5.00  4.22 - 5.81 MIL/uL   Hemoglobin 14.6  13.0 - 17.0 g/dL   HCT 29.5  62.1 - 30.8 %   MCV 81.8  78.0 - 100.0 fL   MCH 29.2  26.0 - 34.0 pg   MCHC 35.7  30.0 - 36.0 g/dL   RDW 65.7  84.6 - 96.2 %   Platelets 134 (*) 150 - 400 K/uL    Date: 09/16/2012  Rate: 86  Rhythm: normal sinus rhythm and premature ventricular contractions (PVC)  QRS Axis: normal  Intervals: normal  ST/T Wave abnormalities: nonspecific ST/T changes  Conduction Disutrbances:left anterior fascicular block  Narrative Interpretation:   Old EKG Reviewed: unchanged No significant changes compared to EKG of 10/30/2011   [name] repeat blood sugar is now down to 256 without any intervention. [facility] cardiology is okay with admission here will contact Matthew hospitalist team for admission. Second troponin is pending. Current time is 4:18 PM   1. Chest pain     MDM  [name] followed by [facility] cardiology. [name] with onset of chest pain or chest tightness at 5 AM this morning. Pain has essentially resolved. [name] had some shortness of breath diaphoresis did take aspirin 325 mg at home and did take 2 sublingual nitroglycerin Matthew nitroglycerin improved a little bit. Matthew [name] arrived to Matthew emergency apartment pain was 2/10. No specific treatment given in Matthew emergency department is essentially gone now. First troponin was negative that was at noon. We'll discuss with [facility] cardiology about admission here her or whether [facility]. [name] has significant coronary artery disease had CABG 2 years ago and defibrillator pacemaker placed 2 years ago. EKG without any acute changes. Basic labs without significant abnormalities other than marked hyperglycemia. [name] is a known diabetic no evidence of acidosis.  I personally performed Matthew services described in this documentation, which was scribed in my presence. Matthew recorded information has been  reviewed and is accurate.     Matthew Jakes, MD 09/16/12 1547  Matthew Jakes, MD 09/16/12 1618  Addendum: As stated above discussed with [facility] cardiology okay for admission here. First troponin was done about 7 hours after Matthew onset of Matthew chest pain. [name] is now chest  pain-free. Blood sugar improved significantly without intervention. Blood sugar now down to 256. Discussed with hospitalist meeting team will do to obs telemetry team 1.  Matthew Jakes, MD 09/16/12 [phone]

## 2012-09-16 NOTE — ED Notes (Signed)
Onset 5 am with chest "tightness" took ntg x2 and sl relief .

## 2012-09-16 NOTE — H&P (Signed)
History and Physical  Matthew [name] RUE:[id number] DOB: 1966/02/09 DOA: 09/16/2012  Referring physician: Dr. Deretha Emory PCP: Syliva Overman, MD  Cardiologist is Dr. Ladona Ridgel with [facility]   Chief Complaint: Chest pain  HPI:  47 year old man presented to the emergency department with chest pain. His pain was eventually resolved with nitroglycerin and has not recurred. He was felt to be stable, discussed with cardiology at [facility] and referred for admission for cardiac rule out.  Patient has a history of ischemic cardiomyopathy, coronary artery disease disease, chronic systolic dysfunction, status post pacemaker placement, status post ICD insertion, CABG 2011. He has had no further cardiac events since that time that he is aware of. He woke up this morning and had some chest discomfort which he rates 6-7/10 this is associated with left arm numbness and perhaps some mild shortness of breath. He took nitroglycerin but did not get immediate relief. He came to the emergency department where eventually his chest pain subsided as did the associated nausea. He took aspirin at home this morning.  In the emergency department he was noted to be afebrile with stable vital signs. EKG was nonacute. He did not require any pain medication or nitroglycerin in the emergency department. Basic metabolic panel was unremarkable, troponin x2 negative, CBC unremarkable, chest x-ray unremarkable.  Review of Systems:  Negative for fever, recent visual changes, sore throat, rash, new muscle aches, dysuria, bleeding, n/v/abdominal pain.  Past Medical History  Diagnosis Date  . Cardiac arrest - ventricular fibrillation 08/26/2009  . CAD (coronary artery disease)     a. OOH MI 6/11; presented with acute CHF; hosp course c/b VF arrest with VDRF, etc;   b s/p CABG: L-LAD/Dx, S-OM/dCFX;  c.Nuclear scan 8/12:  Inferior and inf-lat scar with minimal peri-infarct ischemia, EF 53%.; d.LHC 9/12:  LAD 90%, pD1 (small) 70%, prox  large Dx 95%, L-LAD and Dx ok, CFX occluded, S-OM and CFX ok, RCA prox to prox/mid 60-70%.  Medical management     . Ischemic cardiomyopathy     echo 10/11:  inf-septal and apical HK, EF 35%, mod LAE  . Diabetic coma with ketoacidosis     Occured in 1994 where he spent 30 days in a coma . He develpoed infection of his leg and developing pancreatitis during that  time   . DM2 (diabetes mellitus, type 2)   . Cellulitis 2012    spider bite  . HTN (hypertension)   . HLD (hyperlipidemia)   . Chronic systolic heart failure   . Thyroid disease   . Coma   . Headache(784.0)     sinus  . Anxiety     stress  . CKD (chronic kidney disease)     Past Surgical History  Procedure Laterality Date  . Coronary artery bypass grafting x4  june 29,2011    x 4  . Tonsillectomy  1998  . Appendectomy  mid 1990  . Right sided abdominal cyst removal    . Carpal tunnel with cubital tunnel  02/22/2012    Procedure: CARPAL TUNNEL WITH CUBITAL TUNNEL;  Surgeon: Dominica Severin, MD;  Location: [facility] OR;  Service: Orthopedics;  Laterality: Right;  Right Carpal Tunnel Release/Right Cubital Tunnel Release and Transposition if Necessary with Flexor Pronator Release  . Cardiac surgery    . Pacemaker insertion    . Icd generator change      Social History:  reports that he has never smoked. His smokeless tobacco use includes Snuff. He reports that  drinks alcohol. He  reports that he does not use illicit drugs.  Allergies  Allergen Reactions  . Ace Inhibitors Cough    Family History  Problem Relation Age of Onset  . Cancer Maternal Grandmother   . Allergies Mother   . Diabetes Mother   . Hypertension Mother   . Hyperlipidemia Mother   . Stroke Father   . Diabetes Sister   . Diabetes Sister   . Allergies Father   . Allergies Sister      Prior to Admission medications   Medication Sig Start Date End Date Taking? Authorizing Provider  aspirin 325 MG tablet Take 325 mg by mouth daily.     Yes Historical  Provider, MD  busPIRone (BUSPAR) 5 MG tablet Take 5 mg by mouth 2 (two) times daily. 09/18/11 09/17/12 Yes Kerri Perches, MD  Chromium Picolinate 200 MCG CAPS Take 200 mcg by mouth daily.    Yes Historical Provider, MD  Cinnamon 500 MG TABS Take 1,000 mg by mouth daily.    Yes Historical Provider, MD  gabapentin (NEURONTIN) 300 MG capsule Take 300 mg by mouth At bedtime. 01/16/12  Yes Historical Provider, MD  hydrochlorothiazide (HYDRODIURIL) 25 MG tablet Take 25 mg by mouth daily.   Yes Historical Provider, MD  insulin glargine (LANTUS SOLOSTAR) 100 UNIT/ML injection Inject 60 Units into the skin at bedtime. 03/13/11  Yes Kerri Perches, MD  insulin lispro (HUMALOG) 100 UNIT/ML injection Inject 20 Units into the skin 3 (three) times daily before meals.    Yes Historical Provider, MD  Liraglutide (VICTOZA) 18 MG/3ML SOPN Inject 1.2 mg into the skin daily.   Yes Historical Provider, MD  LORazepam (ATIVAN) 1 MG tablet Take 1 mg by mouth daily.   Yes Historical Provider, MD  losartan (COZAAR) 50 MG tablet Take 1 tablet (50 mg total) by mouth daily. 09/18/11 09/17/12 Yes Kerri Perches, MD  methylcellulose (ARTIFICIAL TEARS) 1 % ophthalmic solution Place 1 drop into both eyes 2 (two) times daily as needed (dry eyes).   Yes Historical Provider, MD  metoprolol tartrate (LOPRESSOR) 25 MG tablet Take 25 mg by mouth 2 (two) times daily.   Yes Historical Provider, MD  Multiple Vitamin (MULTIVITAMIN) tablet Take 1 tablet by mouth daily.     Yes Historical Provider, MD  nitroGLYCERIN (NITROSTAT) 0.4 MG SL tablet Place 0.4 mg under the tongue every 5 (five) minutes as needed for chest pain. 11/01/10 09/16/12 Yes Scott Moishe Spice, PA-C  Omega-3 Fatty Acids (FISH OIL BURP-LESS PO) Take 1 tablet by mouth daily.     Yes Historical Provider, MD  rosuvastatin (CRESTOR) 10 MG tablet Take 1 tablet (10 mg total) by mouth daily. 09/18/11  Yes Kerri Perches, MD  metoprolol tartrate (LOPRESSOR) 25 MG tablet TAKE ONE  TABLET BY MOUTH TWICE DAILY 04/04/12   Kerri Perches, MD   Physical Exam: Filed Vitals:   09/16/12 1342 09/16/12 1409 09/16/12 1522 09/16/12 1608  BP: 129/77 136/67 121/78 145/89  Pulse: 80 68 76 78  Temp:      TempSrc:      Resp:  17 14 18   SpO2: 94% 95% 98% 96%    General: Examined in emergency department. Appears calm and comfortable. Well-appearing. Eyes: Pupils equal, round, react to light. Wears glasses. Normal lids, irises. ENT: Grossly normal hearing. Lips and tongue appear unremarkable. Neck: No lymphadenopathy or masses. No thyromegaly. Cardiovascular: Regular rate and rhythm. No murmur, rub, gallop. No lower extremity edema. Respiratory: Clear to auscultation bilaterally. No wheezes,  rales, rhonchi. Normal respiratory effort. Abdomen: Soft, nontender, nondistended. Skin: No rash, erythema or induration seen. Musculoskeletal: Grossly normal tone all extremities. Psychiatric: Grossly normal mood and affect. Speech fluent and appropriate. Neurologic: Grossly normal.  Wt Readings from Last 3 Encounters:  02/19/12 123.8 kg (272 lb 14.9 oz)  02/02/12 120.203 kg (265 lb)  11/01/11 122.199 kg (269 lb 6.4 oz)    Labs on Admission:  Basic Metabolic Panel:  Recent Labs Lab 09/16/12 1210  NA 133*  K 4.1  CL 95*  CO2 29  GLUCOSE 463*  BUN 26*  CREATININE 1.22  CALCIUM 9.5    CBC:  Recent Labs Lab 09/16/12 1210  WBC 8.5  HGB 14.6  HCT 40.9  MCV 81.8  PLT 134*    Cardiac Enzymes:  Recent Labs Lab 09/16/12 1210 09/16/12 1556  TROPONINI <0.30 <0.30   CBG:  Recent Labs Lab 09/16/12 1605  GLUCAP 265*     Radiological Exams on Admission: Dg Chest 2 View  09/16/2012   *RADIOLOGY REPORT*  Clinical Data: Chest pain and pressure, history MI  CHEST - 2 VIEW  Comparison: 10/30/2011  Findings: Left subclavian AICD leads project at the right atrium and right ventricle. Enlargement of cardiac silhouette post CABG. Mediastinal contours and pulmonary  vascularity normal. Lungs clear. No pleural effusion, pneumothorax or acute osseous findings.  IMPRESSION: Enlargement of cardiac silhouette post CABG and AICD. No acute abnormalities.   Original Report Authenticated By: Ulyses Southward, M.D.    EKG: Independently reviewed. Normal sinus rhythm, left anterior fascicular block. No acute changes.   Principal Problem:   Chest pain, unspecified Active Problems:   Diabetes mellitus, insulin dependent (IDDM), uncontrolled   HYPERTENSION, BENIGN   Coronary atherosclerosis of native coronary artery   AUTOMATIC IMPLANTABLE CARDIAC DEFIBRILLATOR SITU   Chronic systolic heart failure   Assessment/Plan 1. Chest pain with typical features: Currently pain-free and troponin negative x2. No acute EKG changes. Plan observation overnight, serial cardiac enzymes, cardiology consultation in the morning. 2. History of coronary artery disease, CABG 3. Ischemic cardiomyopathy, chronic systolic congestive heart failure: Appears well compensated. Continue Cozaar, beta blocker, Crestor. 4. Status post pacemaker, ICD insertion 5. Diabetes mellitus type 2: Sliding scale insulin, Lantus, meal coverage. 6. Hypertension, hyperlipidemia: Appears stable.  Code Status: Full code  DVT prophylaxis: Lovenox Family Communication: None present Disposition Plan/Anticipated LOS: Observation. 24-48 hours.  Time spent: 50 minutes  Brendia Sacks, MD  [facility] Pager [phone] 09/16/2012, 5:54 PM

## 2012-09-17 LAB — BASIC METABOLIC PANEL
Chloride: 96 mEq/L (ref 96–112)
GFR calc Af Amer: 70 mL/min — ABNORMAL LOW (ref >90)
GFR calc non Af Amer: 60 mL/min — ABNORMAL LOW (ref >90)
Potassium: 4.2 mEq/L (ref 3.5–5.1)
Sodium: 133 mEq/L — ABNORMAL LOW (ref 135–145)

## 2012-09-17 LAB — GLUCOSE, CAPILLARY

## 2012-09-17 MED ORDER — NITROGLYCERIN 0.4 MG SL SUBL: 0.4000 mg | SUBLINGUAL_TABLET | SUBLINGUAL | Status: DC | PRN

## 2012-09-17 MED ORDER — POLYVINYL ALCOHOL 1.4 % OP SOLN: 1.0000 [drp] | Freq: Every day | OPHTHALMIC | Status: DC | PRN

## 2012-09-17 NOTE — Progress Notes (Signed)
Inpatient Diabetes Program Recommendations  AACE/ADA: New Consensus Statement on Inpatient Glycemic Control (2013)  Target Ranges:  Prepandial:   less than 140 mg/dL      Peak postprandial:   less than 180 mg/dL (1-2 hours)      Critically ill patients:  140 - 180 mg/dL  Results for ZEBEDIAH, BEEZLEY (MRN [id number]) as of 09/17/2012 09:38  Ref. Range 09/16/2012 12:10 09/17/2012 02:43  Glucose Latest Range: 70-99 mg/dL 409 (H) 811 (H)   Results for DARTANYAN, DEASIS (MRN [id number]) as of 09/17/2012 09:38  Ref. Range 09/16/2012 16:05 09/16/2012 21:37 09/17/2012 07:27  Glucose-Capillary Latest Range: 70-99 mg/dL 213 (H) 086 (H) 578 (H)    Inpatient Diabetes Program Recommendations Correction (SSI): Please increase Novolog correction to Resistant scale. HgbA1C: Please order an A1C to determine glycemic control over the past 2-3 months.  Note: Patient has a history of diabetes and takes Lantus 60 units QHS, Humalog 20 units TID, and Victoza 1.2 mg daily at home for diabetes management.  Currently, patient is ordered to receive Lantus 60 units QHS, Novolog 0-9 AC, and Novolog 10 units TID meal coverage for inpatient glycemic control.  Currently patient is NPO, if patient is not ordered a diet, please change frequency of CBGs and Novolog correction to Q4H while NPO.  Please increase Novolog correction to resistant scale and order an A1C.  Last A1C in the chart was 12.9% on 05/11/11.  If hyperglycemia persists with increase in Novolog scale, will likely need to increase basal insulin.  Will continue to follow.  Thanks, Orlando Penner, RN, MSN, CCRN Diabetes Coordinator Inpatient Diabetes Program [phone]

## 2012-09-17 NOTE — Progress Notes (Signed)
[facility] PROGRESS NOTE  AURON TADROS XBJ:[id number] DOB: 14-Aug-1965 DOA: 09/16/2012 PCP: Syliva Overman, MD Cardiologist: Lewayne Bunting, MD  Assessment/Plan: 1. Chest pain with typical features: No recurrence. Pain-free. Troponins negative. No acute EKG changes. By history it appears patient has chronic stable angina. Discussed with his cardiologist Dr. Jeanelle Malling agree that no further evaluations indicated at this point and the patient in followup in the next 2 weeks. 2. History of coronary artery disease, CABG 3. History of ischemic cardiomyopathy, chronic systolic dose of heart failure: Will compensated. Continue Cozaar, beta blocker, Crestor. 4. Status post pacemaker, ICD insertion 5. Diabetes mellitus type 2: Until insulin, Lantus, meal coverage. 6. Hypertension, hyperlipidemia: Stable.   Discharge home today  Code Status: Full code DVT prophylaxis: Lovenox Family Communication: None present Disposition Plan: As above  Brendia Sacks, MD  [facility]  Pager [phone] If 7PM-7AM, please contact night-coverage at [email or web address], password [facility] 09/17/2012, 1:05 PM  LOS: 1 day   Clinical Summary: 47 year old man presented to the emergency department with chest pain. His pain was eventually resolved with nitroglycerin and has not recurred. He was felt to be stable, discussed with cardiology at [facility] and referred for admission for cardiac rule out.  Consultants:  None   Procedures:  None   HPI/Subjective: Feels well. No recurrent chest pain. No trouble breathing. Hungry. He does report intermittent angina at home for which he takes nitroglycerin.  Objective: Filed Vitals:   09/16/12 1730 09/16/12 1835 09/16/12 2156 09/17/12 0546  BP: 140/68 143/96 152/81 123/76  Pulse: 75 83 86 75  Temp:  98 F (36.7 C) 97.9 F (36.6 C) 97.6 F (36.4 C)  TempSrc:  Oral Oral Oral  Resp: 17  20 20   Height:  5\' 9"  (1.753 m)    Weight:  123.378 kg (272 lb)     SpO2: 96% 94% 92% 97%    Intake/Output Summary (Last 24 hours) at 09/17/12 1305 Last data filed at 09/17/12 0700  Gross per 24 hour  Intake    240 ml  Output      0 ml  Net    240 ml     Filed Weights   09/16/12 1835  Weight: 123.378 kg (272 lb)    Exam:   Afebrile, vital signs stable. No hypoxia.  Appears well. Calm and comfortable.  Cardiovascular: Rate and rhythm. No murmur, rub, gallop. Minimal lower extremity edema.  Respiratory: Clear to auscultation bilaterally. No wheezes, rales, rhonchi. Normal respiratory effort.  Psychiatric: Grossly normal mood and affect. Speech fluent and appropriate.  Data Reviewed:  CBG stable  BMP unremarkable  Troponins negative  Pending studies:   None  Scheduled Meds: . aspirin  325 mg Oral Daily  . atorvastatin  20 mg Oral q1800  . busPIRone  5 mg Oral BID  . enoxaparin (LOVENOX) injection  40 mg Subcutaneous Q24H  . gabapentin  300 mg Oral QHS  . hydrochlorothiazide  25 mg Oral Daily  . insulin aspart  0-9 Units Subcutaneous TID WC  . insulin aspart  10 Units Subcutaneous TID WC  . insulin glargine  60 Units Subcutaneous QHS  . losartan  50 mg Oral Daily  . metoprolol tartrate  25 mg Oral BID  . sodium chloride  3 mL Intravenous Q12H  . sodium chloride  3 mL Intravenous Q12H   Continuous Infusions:   Principal Problem:   Chest pain, unspecified Active Problems:   Diabetes mellitus, insulin dependent (IDDM), uncontrolled   HYPERTENSION, BENIGN  Coronary atherosclerosis of native coronary artery   AUTOMATIC IMPLANTABLE CARDIAC DEFIBRILLATOR SITU   Chronic systolic heart failure

## 2012-09-17 NOTE — Discharge Summary (Signed)
Physician Discharge Summary  ROLLO FARQUHAR ZOX:[id number] DOB: 07-22-1965 DOA: 09/16/2012  PCP: Syliva Overman, MD Cardiologist: Lewayne Bunting, MD  Admit date: 09/16/2012 Discharge date: 09/17/2012  Recommendations for Outpatient Follow-up:  1. Followup chest pain as clinically indicated  Follow-up Information   Follow up with Syliva Overman, MD In 2 weeks.   Contact information:   [address], [address] [place] [address] [phone]       Follow up with Lewayne Bunting, MD. (office will contact you for appointment, 2 weeks)    Contact information:   [address] [place] [address] [phone]      Discharge Diagnoses:  1. Chest pain with typical features, suspect chronic stable angina 2. History coronary artery disease, CABG, ischemic cardiomyopathy, pacemaker insertion, ICD insertion 3. Diabetes mellitus type 2  Discharge Condition: Improved Disposition: Home  Diet recommendation: Heart healthy diabetic diet  Filed Weights   09/16/12 1835  Weight: 123.378 kg (272 lb)   History of present illness:  47 year old man presented to the emergency department with chest pain. His pain was eventually resolved with nitroglycerin and has not recurred. He was felt to be stable, discussed with cardiology at [facility] and referred for admission for cardiac rule out.  Hospital Course:  Mr. Tremont was admitted for further observation. He had no recurrence of chest pain. Serial troponins were negative. Telemetry was unremarkable. He does report intermittent chest pain over time prior to admission which is relieved with nitroglycerin. It appears she has chronic stable angina case discussed with his primary cardiologist by telephone, no further evaluation recommended, followup in the office in about 2 weeks. Discussed with patient the importance of returning for evaluation for recurrent chest pain unrelieved with nitroglycerin. Other issues as below.  1. Chest  pain with typical features: No recurrence. Pain-free. Troponins negative. No acute EKG changes. By history it appears patient has chronic stable angina. Discussed with his cardiologist Dr. Jeanelle Malling agree that no further evaluation indicated at this point and the patient can followup in the next 2 weeks. 2. History of coronary artery disease, CABG 3. History of ischemic cardiomyopathy, chronic systolic dose of heart failure: Will compensated. Continue Cozaar, beta blocker, Crestor. 4. Status post pacemaker, ICD insertion 5. Diabetes mellitus type 2: Until insulin, Lantus, meal coverage. 6. Hypertension, hyperlipidemia: Stable.  Consultants:  None  Procedures:  None   Discharge Instructions  Discharge Orders   Future Orders Complete By Expires     Activity as tolerated - No restrictions  As directed     Diet - low sodium heart healthy  As directed     Diet Carb Modified  As directed     Discharge instructions  As directed     Comments:      Your hospitalization was discussed with your cardiologist Dr. Ladona Ridgel by telephone. He does not recommend any further evaluation but would like to see you in the office in 2 weeks. Call your physician or seek immediate medical attention for recurrent chest pain or worsening of condition.        Medication List         aspirin 325 MG tablet  Take 325 mg by mouth daily.     busPIRone 5 MG tablet  Commonly known as:  BUSPAR  Take 5 mg by mouth 2 (two) times daily.     Chromium Picolinate 200 MCG Caps  Take 200 mcg by mouth daily.     Cinnamon 500 MG Tabs  Take 1,000 mg by mouth daily.     FISH OIL BURP-LESS PO  Take 1 tablet by mouth daily.     gabapentin 300 MG capsule  Commonly known as:  NEURONTIN  Take 300 mg by mouth At bedtime.     hydrochlorothiazide 25 MG tablet  Commonly known as:  HYDRODIURIL  Take 25 mg by mouth daily.     insulin lispro 100 UNIT/ML injection  Commonly known as:  HUMALOG  Inject 20 Units into the skin 3  (three) times daily before meals.     LANTUS SOLOSTAR 100 UNIT/ML injection  Generic drug:  insulin glargine  Inject 60 Units into the skin at bedtime.     LORazepam 1 MG tablet  Commonly known as:  ATIVAN  Take 1 mg by mouth daily.     losartan 50 MG tablet  Commonly known as:  COZAAR  Take 1 tablet (50 mg total) by mouth daily.     methylcellulose 1 % ophthalmic solution  Commonly known as:  ARTIFICIAL TEARS  Place 1 drop into both eyes 2 (two) times daily as needed (dry eyes).     metoprolol tartrate 25 MG tablet  Commonly known as:  LOPRESSOR  Take 25 mg by mouth 2 (two) times daily.     multivitamin tablet  Take 1 tablet by mouth daily.     nitroGLYCERIN 0.4 MG SL tablet  Commonly known as:  NITROSTAT  Place 0.4 mg under the tongue every 5 (five) minutes as needed for chest pain.     nitroGLYCERIN 0.4 MG SL tablet  Commonly known as:  NITROSTAT  Place 1 tablet (0.4 mg total) under the tongue every 5 (five) minutes as needed for chest pain.     rosuvastatin 10 MG tablet  Commonly known as:  CRESTOR  Take 1 tablet (10 mg total) by mouth daily.     VICTOZA 18 MG/3ML Sopn  Generic drug:  Liraglutide  Inject 1.2 mg into the skin daily.       Allergies  Allergen Reactions  . Ace Inhibitors Cough    The results of significant diagnostics from this hospitalization (including imaging, microbiology, ancillary and laboratory) are listed below for reference.    Significant Diagnostic Studies: Dg Chest 2 View  09/16/2012   *RADIOLOGY REPORT*  Clinical Data: Chest pain and pressure, history MI  CHEST - 2 VIEW  Comparison: 10/30/2011  Findings: Left subclavian AICD leads project at the right atrium and right ventricle. Enlargement of cardiac silhouette post CABG. Mediastinal contours and pulmonary vascularity normal. Lungs clear. No pleural effusion, pneumothorax or acute osseous findings.  IMPRESSION: Enlargement of cardiac silhouette post CABG and AICD. No acute  abnormalities.   Original Report Authenticated By: Ulyses Southward, M.D.    Labs: Basic Metabolic Panel:  Recent Labs Lab 09/16/12 1210 09/17/12 0243  NA 133* 133*  K 4.1 4.2  CL 95* 96  CO2 29 33*  GLUCOSE 463* 304*  BUN 26* 22  CREATININE 1.22 1.36*  CALCIUM 9.5 9.1   CBC:  Recent Labs Lab 09/16/12 1210  WBC 8.5  HGB 14.6  HCT 40.9  MCV 81.8  PLT 134*   Cardiac Enzymes:  Recent Labs Lab 09/16/12 1210 09/16/12 1556 09/16/12 2102 09/17/12 0242 09/17/12 1021  TROPONINI <0.30 <0.30 <0.30 <0.30 <0.30   CBG:  Recent Labs Lab 09/16/12 1605 09/16/12 2137 09/17/12 0727 09/17/12 1126  GLUCAP 265* 350* 302* 288*    Principal Problem:   Chest pain, unspecified Active Problems:  Diabetes mellitus, insulin dependent (IDDM), uncontrolled   HYPERTENSION, BENIGN   Coronary atherosclerosis of native coronary artery   AUTOMATIC IMPLANTABLE CARDIAC DEFIBRILLATOR SITU   Chronic systolic heart failure   Time coordinating discharge: 25 minutes  Signed:  Brendia Sacks, MD [facility] 09/17/2012, 1:18 PM

## 2012-09-17 NOTE — Progress Notes (Signed)
Pt discharged home today per Dr. Irene Limbo. Pt's IV site D/C'd and WNL. Pt's VS stable at this time. Pt provided with home medication list, discharge instructions. Verbalized understanding. Pt left floor in via WC in stable condition accompanied by NT.

## 2012-10-02 ENCOUNTER — Encounter: Payer: Self-pay | Admitting: Family Medicine

## 2012-10-02 ENCOUNTER — Ambulatory Visit (INDEPENDENT_AMBULATORY_CARE_PROVIDER_SITE_OTHER): Payer: PRIVATE HEALTH INSURANCE | Admitting: Family Medicine

## 2012-10-02 ENCOUNTER — Other Ambulatory Visit: Payer: Self-pay | Admitting: Family Medicine

## 2012-10-02 VITALS — BP 118/72 | HR 84 | Resp 18 | Ht 69.0 in

## 2012-10-02 DIAGNOSIS — F439 Reaction to severe stress, unspecified: Secondary | ICD-10-CM

## 2012-10-02 DIAGNOSIS — Z733 Stress, not elsewhere classified: Secondary | ICD-10-CM

## 2012-10-02 DIAGNOSIS — E1065 Type 1 diabetes mellitus with hyperglycemia: Secondary | ICD-10-CM

## 2012-10-02 DIAGNOSIS — F411 Generalized anxiety disorder: Secondary | ICD-10-CM

## 2012-10-02 DIAGNOSIS — F419 Anxiety disorder, unspecified: Secondary | ICD-10-CM

## 2012-10-02 DIAGNOSIS — E785 Hyperlipidemia, unspecified: Secondary | ICD-10-CM

## 2012-10-02 DIAGNOSIS — IMO0002 Reserved for concepts with insufficient information to code with codable children: Secondary | ICD-10-CM

## 2012-10-02 DIAGNOSIS — I10 Essential (primary) hypertension: Secondary | ICD-10-CM

## 2012-10-02 DIAGNOSIS — Z125 Encounter for screening for malignant neoplasm of prostate: Secondary | ICD-10-CM

## 2012-10-02 MED ORDER — BUSPIRONE HCL 5 MG PO TABS: 5.0000 mg | ORAL_TABLET | Freq: Three times a day (TID) | ORAL | Status: DC

## 2012-10-02 NOTE — Progress Notes (Signed)
  Subjective:    Patient ID: Matthew [name], male    DOB: Mar 29, 1965, 47 y.o.   MRN: [id number]  HPI Pt in for follow up recent hospitalization for unstable angina, presented with chest pain, no evidence of ACS during hospitalization  States that in the 90's, around 1994 he was misdiagnosed as a diabetic and was in a coma  For 31 days, in [place]  Has a h/o multiple episodes of head trauma as well Now reports that he was recently written up for memory issues, and feels that some of this is not only a result of previous head trauma, but also his /o coma. Seriously looking into disability, with the prodding of his family , reportedly, due to multiple debilitating and uncontrolled medical conditions all of which are potentially life threatening States he feels overwhelmed and stressed out espescialy with his deteriorating health  Anxiety re job is excessive, dealing with it becoming an increasing challenge Vision loss in left eye x 4 month, retinal hemmorhage Right carpal tunnel and  Right elbow surgery January 2014, has left upper extremity to be done Medcation compliance is an ongoing challenge due to financial reasons reportedly   Review of Systems See HPI Denies recent fever or chills. Denies sinus pressure, nasal congestion, ear pain or sore throat. Denies chest congestion, productive cough or wheezing. Denies leg swelling , pNd or orthopnea Denies abdominal pain, nausea, vomiting,diarrhea or constipation.   Denies dysuria, frequency, hesitancy or incontinence. Denies joint pain, swelling and limitation in mobility. Denies headaches, has reduced sensation in left foot . Denies skin break down or rash.        Objective:   Physical Exam  Patient alert and oriented and in no cardiopulmonary distress.Anxious   HEENT: No facial asymmetry, EOMI, no sinus tenderness,  oropharynx pink and moist.  Neck supple no adenopathy.  Chest: Clear to auscultation bilaterally.  CVS: S1,  S2 no murmurs, no S3.No JVd  ABD: Soft non tender. Bowel sounds normal.  Ext: No edema  MS: Adequate ROM spine, shoulders, hips and knees.  Skin: Intact, no ulcerations or rash noted.  Psych: Good eye contact, . Memory  Grossly intact, anxious mildly  depressed appearing.  CNS: CN 2-12 intact, power, normal throughout.       Assessment & Plan:

## 2012-10-02 NOTE — Patient Instructions (Addendum)
F/u in 3 month, call if you need me before  I do believe that you should move forward with disability application based on your health history  Labs today and appt with Dr Fransico Him as soon as possible , HBA1C, lipid, cmp and EGFR, HBA1C PSA  New for anxiety is buspar 3 times daily

## 2012-10-03 LAB — COMPLETE METABOLIC PANEL WITH GFR
AST: 16 U/L (ref 0–37)
Albumin: 4 g/dL (ref 3.5–5.2)
BUN: 23 mg/dL (ref 6–23)
CO2: 33 mEq/L — ABNORMAL HIGH (ref 19–32)
Calcium: 10.2 mg/dL (ref 8.4–10.5)
Chloride: 105 mEq/L (ref 96–112)
GFR, Est African American: 70 mL/min
GFR, Est Non African American: 61 mL/min
Glucose, Bld: 76 mg/dL (ref 70–99)
Potassium: 4.5 mEq/L (ref 3.5–5.3)

## 2012-10-03 LAB — HEMOGLOBIN A1C
Hgb A1c MFr Bld: 11.7 % — ABNORMAL HIGH (ref <5.7)
Mean Plasma Glucose: 289 mg/dL — ABNORMAL HIGH (ref <117)

## 2012-10-03 LAB — MICROALBUMIN / CREATININE URINE RATIO: Microalb, Ur: 151.44 mg/dL — ABNORMAL HIGH (ref 0.00–1.89)

## 2012-10-03 LAB — LIPID PANEL: Cholesterol: 266 mg/dL — ABNORMAL HIGH (ref 0–200)

## 2012-10-06 ENCOUNTER — Other Ambulatory Visit: Payer: Self-pay | Admitting: Family Medicine

## 2012-10-06 ENCOUNTER — Telehealth: Payer: Self-pay | Admitting: Family Medicine

## 2012-10-06 NOTE — Telephone Encounter (Signed)
Please send me a f/u message on this call

## 2012-10-06 NOTE — Assessment & Plan Note (Signed)
Uncontrolled, med compliancxe an issue will need to contact pt to se if he as any medication available , encourage him to apply for help from the manufacturer

## 2012-10-06 NOTE — Assessment & Plan Note (Signed)
Uncontrolled, needs to re establish with endocrine. Pt with all the complications of uncontrolled diabetes, cardiovascular  Disease, heart disease, nephropathy, retinal disease, neurologic disease

## 2012-10-06 NOTE — Telephone Encounter (Signed)
Please contact pt to review his labs. THIS IS IMPORTANT. Let him know blood sugar is uncontrolled and he needs to make and keep appt with Dr Fransico Him Cholesterol is very high, i suspect he has no medication taking. He can collect coupon for crestor and any samples we may have , 20mg  dose preferred He is to let me know if unable to afford crestor with the coupon will send in pravachol instead. Need to reduce fried and fatty foods I am entering the crestor histoically pls send in after you spk with him Let him know that he is spilling protein in his urine since blood sugar is uncontrolled , so it is vital he see endo and get blood sugar addressed. Let hin know that I sent a special message to dr Fransico Him asking trha hee see hinm asap  Liver function is normal

## 2012-10-06 NOTE — Assessment & Plan Note (Signed)
Controlled, no change in medication  

## 2012-10-06 NOTE — Assessment & Plan Note (Signed)
Deteriorate, as health worsens , pt is finding it increasingly challenging to manage his stress Increase buspar dose with limited supply of ativan for uncontrolled anxiety

## 2012-10-06 NOTE — Assessment & Plan Note (Signed)
uncontrolled , reports marked increase and intends to look into disability which on medical grounds I believe that he does need

## 2012-10-07 NOTE — Telephone Encounter (Signed)
pls either check pharmacy or have him confirm the dose, old med list said 10mg  , his numbers are extremely high , he will need a dose increase to 40mg  , pls let me know so I can make the change,lthanks

## 2012-10-07 NOTE — Telephone Encounter (Signed)
He was out of the crestor for about a week but now he is taking it again. Will contact Nida for an appt asap

## 2012-10-08 ENCOUNTER — Other Ambulatory Visit: Payer: Self-pay | Admitting: Family Medicine

## 2012-10-08 MED ORDER — PRAVASTATIN SODIUM 80 MG PO TABS: 80.0000 mg | ORAL_TABLET | Freq: Every evening | ORAL | Status: DC

## 2012-10-08 NOTE — Telephone Encounter (Signed)
Needs to talk to HR on his job re short term disabilty coverage. Will not need to come back in  Standard cost of form completion applies , and is collected at front before form is completed

## 2012-10-08 NOTE — Telephone Encounter (Signed)
Noted, I will send in pravachol 80 mg daily, pls let him know

## 2012-10-08 NOTE — Telephone Encounter (Signed)
Goes back to work tomorrow and wants to apply for disability and wants to know what he needs to do as far as going out of work first on short term disability or getting a letter from you or if he has to come back to the office he is willing to do that also. He understands he cannot get disability as long as he is working

## 2012-10-08 NOTE — Telephone Encounter (Signed)
He is on the crestor 10mg  and wants to change to a generic med so its cheaper.  [facility] [employer]

## 2012-10-09 ENCOUNTER — Telehealth: Payer: Self-pay

## 2012-10-09 NOTE — Telephone Encounter (Signed)
Patient aware.

## 2012-10-09 NOTE — Telephone Encounter (Signed)
Dropped of ST disability form today. States he needs it back asap because he is unable to work. I told him it could take up to 7 days and he wants to know if he can have a work note to take him out until his forms can be completed. He states he cannot work like this. Please advise

## 2012-10-09 NOTE — Telephone Encounter (Signed)
pls fill in form as far as his medical problems, heart diseasse with pacmaker, uncontrolled diabetes etc as far as you are able I will take a look at it after the pts  Have all been seen

## 2012-10-10 NOTE — Telephone Encounter (Signed)
Noted  

## 2012-10-14 ENCOUNTER — Telehealth: Payer: Self-pay | Admitting: Family Medicine

## 2012-10-16 ENCOUNTER — Encounter: Payer: Self-pay | Admitting: Internal Medicine

## 2012-10-16 ENCOUNTER — Ambulatory Visit (INDEPENDENT_AMBULATORY_CARE_PROVIDER_SITE_OTHER): Payer: PRIVATE HEALTH INSURANCE | Admitting: Internal Medicine

## 2012-10-16 VITALS — BP 174/89 | HR 78 | Ht 69.0 in | Wt 274.6 lb

## 2012-10-16 DIAGNOSIS — Z95810 Presence of automatic (implantable) cardiac defibrillator: Secondary | ICD-10-CM

## 2012-10-16 DIAGNOSIS — I472 Ventricular tachycardia: Secondary | ICD-10-CM

## 2012-10-16 DIAGNOSIS — I5022 Chronic systolic (congestive) heart failure: Secondary | ICD-10-CM

## 2012-10-16 DIAGNOSIS — E669 Obesity, unspecified: Secondary | ICD-10-CM

## 2012-10-16 LAB — ICD DEVICE OBSERVATION
AL AMPLITUDE: 2.6 mv
DEV-0020ICD: NEGATIVE
RV LEAD IMPEDENCE ICD: 492 Ohm
RV LEAD THRESHOLD: 0.4 V
TZAT-0001FASTVT: 2
TZAT-0018FASTVT: NEGATIVE
TZST-0001FASTVT: 3
TZST-0001FASTVT: 5
TZST-0001FASTVT: 8
TZST-0003FASTVT: 36 J
TZST-0003FASTVT: 41 J

## 2012-10-16 MED ORDER — FUROSEMIDE 40 MG PO TABS: 40.0000 mg | ORAL_TABLET | Freq: Every day | ORAL | Status: DC

## 2012-10-16 NOTE — Assessment & Plan Note (Signed)
He has had no sustained ventricular arrhythmias or ICD shocks. No change in medical therapy.

## 2012-10-16 NOTE — Telephone Encounter (Signed)
Papers are completed. Pt collected this pm

## 2012-10-16 NOTE — Assessment & Plan Note (Signed)
I discuss importance of weight loss, and he will try to increase his physical activity and eat less.

## 2012-10-16 NOTE — Assessment & Plan Note (Signed)
His [employer] ICD is working normally. We'll plan to recheck in several months. 

## 2012-10-16 NOTE — Patient Instructions (Signed)
Your physician recommends that you schedule a follow-up appointment in: 3 months with device clinic   Your physician wants you to follow-up in: 12 months with Dr Court Joy will receive a reminder letter in the mail two months in advance. If you don't receive a letter, please call our office to schedule the follow-up appointment.   Your physician has recommended you make the following change in your medication:  1) Start Furosemide 40mg  daily 2) STOP HCTZ

## 2012-10-16 NOTE — Progress Notes (Signed)
HPI [name] returns today for followup. He is a pleasant 47 yo man with a h/o an ICM, chronic systolic heart failure, nonsustained ventricular tachycardia, morbid obesity, and diabetes. In the interim, he continues to experience class II heart failure. He also has nonexertional chest discomfort, which is not been thought to be due to coronary disease or unstable angina.no recent ICD shocks and no syncope. He does get dyspnea with exertion. Allergies  Allergen Reactions  . Ace Inhibitors Cough     Current Outpatient Prescriptions  Medication Sig Dispense Refill  . aspirin 325 MG tablet Take 325 mg by mouth daily.        . busPIRone (BUSPAR) 5 MG tablet Take 10 mg by mouth 3 (three) times daily.      . Chromium Picolinate 200 MCG CAPS Take 200 mcg by mouth daily.       . Cinnamon 500 MG TABS Take 1,000 mg by mouth daily.       Marland Kitchen gabapentin (NEURONTIN) 300 MG capsule Take 300 mg by mouth At bedtime.      . insulin glargine (LANTUS SOLOSTAR) 100 UNIT/ML injection Inject 60 Units into the skin at bedtime.  10 mL  12  . insulin lispro (HUMALOG) 100 UNIT/ML injection Inject 20 Units into the skin 3 (three) times daily before meals.       . Liraglutide (VICTOZA) 18 MG/3ML SOPN Inject 1.2 mg into the skin daily.      Marland Kitchen losartan (COZAAR) 50 MG tablet Take 1 tablet (50 mg total) by mouth daily.  30 tablet  3  . methylcellulose (ARTIFICIAL TEARS) 1 % ophthalmic solution Place 1 drop into both eyes 2 (two) times daily as needed (dry eyes).      . metoprolol tartrate (LOPRESSOR) 25 MG tablet Take 25 mg by mouth 2 (two) times daily.      . Multiple Vitamin (MULTIVITAMIN) tablet Take 1 tablet by mouth daily.        . nitroGLYCERIN (NITROSTAT) 0.4 MG SL tablet Place 0.4 mg under the tongue every 5 (five) minutes as needed for chest pain.      . Omega-3 Fatty Acids (FISH OIL BURP-LESS PO) Take 1 tablet by mouth daily.        . pravastatin (PRAVACHOL) 80 MG tablet Take 1 tablet (80 mg total) by mouth every  evening.  30 tablet  11  . furosemide (LASIX) 40 MG tablet Take 1 tablet (40 mg total) by mouth daily.  90 tablet  3   No current facility-administered medications for this visit.     Past Medical History  Diagnosis Date  . Cardiac arrest - ventricular fibrillation 08/26/2009  . CAD (coronary artery disease)     a. OOH MI 6/11; presented with acute CHF; hosp course c/b VF arrest with VDRF, etc;   b s/p CABG: L-LAD/Dx, S-OM/dCFX;  c.Nuclear scan 8/12:  Inferior and inf-lat scar with minimal peri-infarct ischemia, EF 53%.; d.LHC 9/12:  LAD 90%, pD1 (small) 70%, prox large Dx 95%, L-LAD and Dx ok, CFX occluded, S-OM and CFX ok, RCA prox to prox/mid 60-70%.  Medical management     . Ischemic cardiomyopathy     echo 10/11:  inf-septal and apical HK, EF 35%, mod LAE  . Diabetic coma with ketoacidosis     Occured in 1994 where he spent 30 days in a coma . He develpoed infection of his leg and developing pancreatitis during that  time   . DM2 (diabetes mellitus, type 2)   .  Cellulitis 2012    spider bite  . HTN (hypertension)   . HLD (hyperlipidemia)   . Chronic systolic heart failure   . Thyroid disease   . Coma   . Headache(784.0)     sinus  . Anxiety     stress  . CKD (chronic kidney disease)     ROS:   All systems reviewed and negative except as noted in the HPI.   Past Surgical History  Procedure Laterality Date  . Coronary artery bypass grafting x4  june 29,2011    x 4  . Tonsillectomy  1998  . Appendectomy  mid 1990  . Right sided abdominal cyst removal    . Carpal tunnel with cubital tunnel  02/22/2012    Procedure: CARPAL TUNNEL WITH CUBITAL TUNNEL;  Surgeon: Dominica Severin, MD;  Location: [facility] OR;  Service: Orthopedics;  Laterality: Right;  Right Carpal Tunnel Release/Right Cubital Tunnel Release and Transposition if Necessary with Flexor Pronator Release  . Cardiac surgery    . Pacemaker insertion       Family History  Problem Relation Age of Onset  . Cancer  Maternal Grandmother   . Allergies Mother   . Diabetes Mother   . Hypertension Mother   . Hyperlipidemia Mother   . Stroke Father   . Diabetes Sister   . Diabetes Sister   . Allergies Father   . Allergies Sister      History   Social History  . Marital Status: Divorced    Spouse Name: N/A    Number of Children: N/A  . Years of Education: N/A   Occupational History  . staffing    Social History Main Topics  . Smoking status: Never Smoker   . Smokeless tobacco: Current User    Types: Snuff  . Alcohol Use: Yes     Comment: rarely  . Drug Use: No  . Sexual Activity: No   Other Topics Concern  . Not on file   Social History Narrative  . No narrative on file     BP 174/89  Pulse 78  Ht 5\' 9"  (1.753 m)  Wt 274 lb 9.6 oz (124.558 kg)  BMI 40.53 kg/m2  Physical Exam:  obese appearing 47 year old man,NAD HEENT: Unremarkable Neck:  7 cm JVD, no thyromegally Back:  No CVA tenderness Lungs:  Clear with no wheezes, rales, or rhonchi. HEART:  Regular rate rhythm, no murmurs, no rubs, no clicks Abd:  Soft, obese, positive bowel sounds, no organomegally, no rebound, no guarding Ext:  2 plus pulses, no edema, no cyanosis, no clubbing Skin:  No rashes no nodules Neuro:  CN II through XII intact, motor grossly intact   DEVICE  Normal device function.  See PaceArt for details.   Assess/Plan:

## 2012-10-16 NOTE — Assessment & Plan Note (Signed)
His chronic systolic heart failure is class IIb. He is limited by his morbid obesity as well. I've recommended that we discontinue hydrochlorothiazide today and switch him to Lasix 40 mg daily.

## 2012-10-17 ENCOUNTER — Other Ambulatory Visit: Payer: Self-pay | Admitting: Family Medicine

## 2012-10-17 ENCOUNTER — Telehealth: Payer: Self-pay | Admitting: Family Medicine

## 2012-10-17 DIAGNOSIS — R413 Other amnesia: Secondary | ICD-10-CM

## 2012-10-17 DIAGNOSIS — F411 Generalized anxiety disorder: Secondary | ICD-10-CM

## 2012-10-17 DIAGNOSIS — F418 Other specified anxiety disorders: Secondary | ICD-10-CM

## 2012-10-17 NOTE — Telephone Encounter (Signed)
Patient is aware and stated that he will go to the appointment and patient has to call and schedule his appointment and he is aware

## 2012-10-17 NOTE — Telephone Encounter (Signed)
Pls contact pt and explain that he needs to be followed by mental health also based on medical history and prolonged stay out of work. This will benefit him I will be referring him both to the therapist  [facility]) and also to psychiatry for medication management of his anxiety , since out of control. Pls explain that this will most likely  also help to support the length of time needed to be out of work as well as improve his overall health Memory  Can also be formally evaluated by therapist , and I am referring him for this also. He c/o memory loss

## 2012-11-05 ENCOUNTER — Ambulatory Visit (INDEPENDENT_AMBULATORY_CARE_PROVIDER_SITE_OTHER): Payer: Self-pay | Admitting: Psychology

## 2012-11-05 DIAGNOSIS — F329 Major depressive disorder, single episode, unspecified: Secondary | ICD-10-CM

## 2012-11-05 DIAGNOSIS — F411 Generalized anxiety disorder: Secondary | ICD-10-CM

## 2012-11-05 DIAGNOSIS — F419 Anxiety disorder, unspecified: Secondary | ICD-10-CM

## 2012-11-14 ENCOUNTER — Ambulatory Visit (INDEPENDENT_AMBULATORY_CARE_PROVIDER_SITE_OTHER): Payer: Self-pay | Admitting: Psychology

## 2012-11-14 DIAGNOSIS — F419 Anxiety disorder, unspecified: Secondary | ICD-10-CM

## 2012-11-14 DIAGNOSIS — F329 Major depressive disorder, single episode, unspecified: Secondary | ICD-10-CM

## 2012-11-14 DIAGNOSIS — F411 Generalized anxiety disorder: Secondary | ICD-10-CM

## 2012-11-19 ENCOUNTER — Telehealth: Payer: Self-pay | Admitting: Family Medicine

## 2012-11-19 NOTE — Telephone Encounter (Signed)
Please advise.  Are we suppose to be referring the the health department for assistance with meds.  And which meds.

## 2012-11-19 NOTE — Telephone Encounter (Signed)
pls speak directly with pt and document what he needs help with and go through the regularprocess

## 2012-11-21 ENCOUNTER — Other Ambulatory Visit: Payer: Self-pay

## 2012-11-21 DIAGNOSIS — E119 Type 2 diabetes mellitus without complications: Secondary | ICD-10-CM

## 2012-11-21 MED ORDER — INSULIN GLARGINE 100 UNIT/ML ~~LOC~~ SOLN: 60.0000 [IU] | Freq: Every day | SUBCUTANEOUS | Status: DC

## 2012-11-21 MED ORDER — INSULIN LISPRO 100 UNIT/ML ~~LOC~~ SOLN: 20.0000 [IU] | Freq: Three times a day (TID) | SUBCUTANEOUS | Status: DC

## 2012-11-21 MED ORDER — LIRAGLUTIDE 18 MG/3ML ~~LOC~~ SOPN: 1.2000 mg | PEN_INJECTOR | Freq: Every day | SUBCUTANEOUS | Status: DC

## 2012-11-21 NOTE — Telephone Encounter (Signed)
All diabetic injections sent to health department.  Will await to hear back from patient as to if he would like other rx  Sent.

## 2012-11-21 NOTE — Telephone Encounter (Signed)
Called number listed.  No option to leave message.  Will refer meds to county.

## 2012-11-22 ENCOUNTER — Telehealth: Payer: Self-pay | Admitting: Family Medicine

## 2012-11-22 NOTE — Telephone Encounter (Signed)
i did not call patient and I don't see any current lab results either

## 2012-11-27 ENCOUNTER — Telehealth: Payer: Self-pay | Admitting: Internal Medicine

## 2012-11-27 NOTE — Telephone Encounter (Signed)
LMTCB 2:49 pm/PE

## 2012-11-27 NOTE — Telephone Encounter (Signed)
New Problem   Pt is considereing social security disability and wanted Dr. Ladona Ridgel to be aware of this decision.. Also request a call back to discuss what paperwork needs to be completed within our office to push this transaction.

## 2012-11-27 NOTE — Telephone Encounter (Signed)
Patient called back 3:00 pm. Patient states he has already began the process of SSDI with Social Security. He has retained Candice Apply, Attorney-at-Law. He wanted Dr. Ladona Ridgel to be aware and to know that Dr. Ladona Ridgel has his permission to respond to SSI or Attorney Apple for inquiries or medical notes. Patient states he has completed the Release of Information and Consents forms. Routed to Dr. Ladona Ridgel.

## 2012-11-28 ENCOUNTER — Ambulatory Visit (INDEPENDENT_AMBULATORY_CARE_PROVIDER_SITE_OTHER): Payer: Self-pay | Admitting: Psychology

## 2012-11-28 ENCOUNTER — Encounter (HOSPITAL_COMMUNITY): Payer: Self-pay | Admitting: Psychology

## 2012-11-28 ENCOUNTER — Encounter: Payer: Self-pay | Admitting: Family Medicine

## 2012-11-28 ENCOUNTER — Telehealth: Payer: Self-pay | Admitting: Family Medicine

## 2012-11-28 DIAGNOSIS — F4001 Agoraphobia with panic disorder: Secondary | ICD-10-CM

## 2012-11-28 DIAGNOSIS — F411 Generalized anxiety disorder: Secondary | ICD-10-CM

## 2012-11-28 DIAGNOSIS — F419 Anxiety disorder, unspecified: Secondary | ICD-10-CM

## 2012-11-28 DIAGNOSIS — H547 Unspecified visual loss: Secondary | ICD-10-CM | POA: Insufficient documentation

## 2012-11-28 DIAGNOSIS — F329 Major depressive disorder, single episode, unspecified: Secondary | ICD-10-CM
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
## 2012-11-28 NOTE — Telephone Encounter (Signed)
Script written pls copy for scan and pt may collect
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
## 2012-11-28 NOTE — Telephone Encounter (Signed)
Patient aware.

## 2012-11-28 NOTE — Telephone Encounter (Signed)
Patient aware and has been contacted by Blima Singer at the [facility]

## 2012-12-05 ENCOUNTER — Other Ambulatory Visit: Payer: Self-pay

## 2012-12-05 DIAGNOSIS — E119 Type 2 diabetes mellitus without complications: Secondary | ICD-10-CM

## 2012-12-05 MED ORDER — INSULIN GLARGINE 100 UNIT/ML SOLOSTAR PEN: PEN_INJECTOR | SUBCUTANEOUS | Status: DC

## 2012-12-05 MED ORDER — ROSUVASTATIN CALCIUM 10 MG PO TABS: ORAL_TABLET | ORAL | Status: DC

## 2012-12-05 MED ORDER — METOPROLOL TARTRATE 25 MG PO TABS: 25.0000 mg | ORAL_TABLET | Freq: Two times a day (BID) | ORAL | Status: DC

## 2012-12-10 ENCOUNTER — Telehealth: Payer: Self-pay | Admitting: Family Medicine

## 2012-12-10 MED ORDER — INSULIN DETEMIR 100 UNIT/ML ~~LOC~~ SOLN: SUBCUTANEOUS | Status: DC

## 2012-12-10 NOTE — Telephone Encounter (Signed)
rx sent for 2 vials from [facility]

## 2012-12-11 ENCOUNTER — Other Ambulatory Visit: Payer: Self-pay

## 2012-12-11 MED ORDER — INSULIN DETEMIR 100 UNIT/ML ~~LOC~~ SOLN: SUBCUTANEOUS | Status: DC

## 2012-12-19 ENCOUNTER — Encounter (HOSPITAL_COMMUNITY): Payer: Self-pay | Admitting: Psychology

## 2012-12-19 ENCOUNTER — Ambulatory Visit (INDEPENDENT_AMBULATORY_CARE_PROVIDER_SITE_OTHER): Payer: PRIVATE HEALTH INSURANCE | Admitting: Psychology

## 2012-12-19 DIAGNOSIS — F329 Major depressive disorder, single episode, unspecified: Secondary | ICD-10-CM

## 2012-12-19 DIAGNOSIS — F4001 Agoraphobia with panic disorder: Secondary | ICD-10-CM
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
## 2013-01-07 ENCOUNTER — Encounter: Payer: Self-pay | Admitting: Family Medicine

## 2013-01-07 ENCOUNTER — Ambulatory Visit (INDEPENDENT_AMBULATORY_CARE_PROVIDER_SITE_OTHER): Payer: PRIVATE HEALTH INSURANCE | Admitting: Family Medicine

## 2013-01-07 ENCOUNTER — Encounter (INDEPENDENT_AMBULATORY_CARE_PROVIDER_SITE_OTHER): Payer: Self-pay

## 2013-01-07 VITALS — BP 126/82 | HR 97 | Resp 18 | Ht 69.0 in

## 2013-01-07 DIAGNOSIS — Z23 Encounter for immunization: Secondary | ICD-10-CM

## 2013-01-07 DIAGNOSIS — I5022 Chronic systolic (congestive) heart failure: Secondary | ICD-10-CM

## 2013-01-07 DIAGNOSIS — E1129 Type 2 diabetes mellitus with other diabetic kidney complication: Secondary | ICD-10-CM

## 2013-01-07 DIAGNOSIS — E785 Hyperlipidemia, unspecified: Secondary | ICD-10-CM

## 2013-01-07 DIAGNOSIS — E1065 Type 1 diabetes mellitus with hyperglycemia: Secondary | ICD-10-CM

## 2013-01-07 DIAGNOSIS — F329 Major depressive disorder, single episode, unspecified: Secondary | ICD-10-CM

## 2013-01-07 DIAGNOSIS — F411 Generalized anxiety disorder: Secondary | ICD-10-CM

## 2013-01-07 DIAGNOSIS — F419 Anxiety disorder, unspecified: Secondary | ICD-10-CM

## 2013-01-07 MED ORDER — FLUOXETINE HCL (PMDD) 10 MG PO CAPS: 1.0000 | ORAL_CAPSULE | Freq: Every day | ORAL | Status: DC

## 2013-01-07 MED ORDER — TEMAZEPAM 15 MG PO CAPS: 15.0000 mg | ORAL_CAPSULE | Freq: Every day | ORAL | Status: DC

## 2013-01-07 NOTE — Progress Notes (Signed)
  Subjective:    Patient ID: Matthew [name], male    DOB: 1965-05-05, 47 y.o.   MRN: [id number]  HPI The PT is here for follow up and re-evaluation of chronic medical conditions, medication management and review of any available recent lab and radiology data.  Preventive health is updated, specifically  Cancer screening and Immunization.   Has been keeping appointments with endocrinology, blood sugars are slightly lower than in the past and he actually feels better. States that blood sugar is seldom over 250 . 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 Being followed by psychology, which he finds helpful, allowing him a professional to help him as he copes with the stress of poor health, and declining ability to work, which he absolutely would like to do. No recent episodes of chest pain, or palpitations.  Lives on his own, and states he is able to get his food safely, does admit that at times he feels very depressed, still has poor sleep, but states he would not commit suicide, "loves life too much" espescialy having nearly lost it on more than one occasion     Review of Systems See HPI Denies recent fever or chills. Denies sinus pressure, nasal congestion, ear pain or sore throat. Denies chest congestion, productive cough or wheezing. Denies chest pains, palpitations and leg swelling Denies abdominal pain, nausea, vomiting,diarrhea or constipation.   Denies dysuria, frequency, hesitancy or incontinence. Denies joint pain, swelling and limitation in mobility.  Denies skin break down or rash.        Objective:   Physical Exam  Patient alert and oriented and in no cardiopulmonary distress.  HEENT: No facial asymmetry, EOMI, no sinus tenderness,  oropharynx pink and moist.  Neck  supple no adenopathy.  Chest: Clear to auscultation bilaterally.  CVS: S1, S2 no murmurs, no S3.  ABD: Soft non tender.  Ext: No edema  MS: Adequate ROM spine, shoulders, hips and knees.  Skin: Intact, no ulcerations or rash noted.  Psych: Good eye contact,blunted affect. not anxious mildly depressed appearing.Becomes slightly tearful during interview  WUJ:[id number] vision grossly, in right eye, under the care of opthalmology. Power normal throughout      Assessment & Plan:

## 2013-01-07 NOTE — Patient Instructions (Signed)
F/u in 8 to 10 weeks , call if you need me before  Flu vaccine today  New for depression is fluoxetine 10mg  one daily, and new for sleep is restoril 1 at bedtime.Call if there  Are any problems Please continue buspar as before Your health is improving gradually , now that you are able to focus your enery and time on it, so keep pressing on

## 2013-01-07 NOTE — Assessment & Plan Note (Addendum)
PHQ 9 score of 17 on 11/04./2014 Pt to start fluoxetine and restoril, continue buspar as before, also therapy Not suicidal or homicidal

## 2013-01-09 ENCOUNTER — Ambulatory Visit (INDEPENDENT_AMBULATORY_CARE_PROVIDER_SITE_OTHER): Payer: Self-pay | Admitting: Psychology

## 2013-01-09 ENCOUNTER — Encounter (HOSPITAL_COMMUNITY): Payer: Self-pay | Admitting: Psychology

## 2013-01-09 DIAGNOSIS — F411 Generalized anxiety disorder: Secondary | ICD-10-CM

## 2013-01-09 DIAGNOSIS — F4001 Agoraphobia with panic disorder: Secondary | ICD-10-CM

## 2013-01-09 DIAGNOSIS — F419 Anxiety disorder, unspecified: Secondary | ICD-10-CM

## 2013-01-09 DIAGNOSIS — F329 Major depressive disorder, single episode, unspecified: Secondary | ICD-10-CM

## 2013-01-09 NOTE — Progress Notes (Signed)
Patient:  Matthew [name]   DOB: [birth year]  MR Number: [id number]  Location: [facility] [address] [place] [address] Dept: [phone]  Start: 11 AM End: 12 PM  Provider/Observer:     Hershal Coria PSYD  Chief Complaint:      Chief Complaint  Patient presents with  . Anxiety  . Stress    Reason For Service:     The patient was referred because of issues related to anxiety and depression. Dr. Lodema Hong referred him for psychotherapeutic interventions. The patient reports that he is dealing with sleeplessness, anxiety, and restlessness. The patient reports that the symptoms have been progressively worsening. The patient reports that he has developed severe neuropathy and also suffered a detached retina. The patient reports that both of his eyes have been causing a great deal of difficulty. 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 The patient reports that he started having anxiety attacks when he would recall experiences of his heart attack. The patient also describes history of multiple head traumas including the head trauma provide accident as well as potential anoxic events. The patient is currently on short-term disability and is just filled out his paperwork for Social Security disability. The patient lost his last because of severe memory issues and anxiety issues.    Interventions Strategy:  Cognitive/behavioral psychotherapeutic interventions  Participation Level:   Active  Participation Quality:  Appropriate      Behavioral Observation:  Well Groomed, Alert, and Appropriate.   Current Psychosocial Factors: Patient's mother had thyroid taken out and  in hosp. And sister has be in hosp with cardiopulmonary disease.   Content of Session:   Reviewed current symptoms and continued work on therapeutic interventions.   Current Status:   The patient reports that stress and anxiety have continued but that the panic events have reduced.  He reports that his vision is improving some in the left eye  Patient Progress:    stable  Target Goals:    target goals include reducing the overall symptoms of anxiety and agitation as well as symptoms of depression as well as help with coping skills particular and his numerous severe medical issues.  Last Reviewed:    01/09/2013  Goals Addressed Today:     Worked on issues related to better coping skills and strategies.  Impression/Diagnosis:   the patient has a number of significant medical issues that he is coping with it has been having anxiety and depression prior and has been exacerbated by his current difficulties. He has developed some panic attacks and anxiety as well. We'll set the patient for individual psychotherapeutic interventions.   Diagnosis:    Axis I: Panic disorder with agoraphobia  Depressive disorder  Anxiety disorder

## 2013-01-11 NOTE — Assessment & Plan Note (Signed)
Uncontrolled Hyperlipidemia:Low fat diet discussed and encouraged.  Needs rept lipid profile

## 2013-01-11 NOTE — Assessment & Plan Note (Signed)
Improved , continue current dose of buspar

## 2013-01-11 NOTE — Assessment & Plan Note (Signed)
Patient advised to reduce carb and sweets, commit to regular physical activity, take meds as prescribed, test blood as directed, and attempt to lose weight, to improve blood sugar control. Pt to keep appt and follow closely with endo, reports improved blood sugars

## 2013-01-11 NOTE — Assessment & Plan Note (Signed)
Asymptomatic and stable currently

## 2013-01-16 ENCOUNTER — Ambulatory Visit (INDEPENDENT_AMBULATORY_CARE_PROVIDER_SITE_OTHER): Payer: PRIVATE HEALTH INSURANCE | Admitting: *Deleted

## 2013-01-16 ENCOUNTER — Encounter: Payer: Self-pay | Admitting: Internal Medicine

## 2013-01-16 DIAGNOSIS — I472 Ventricular tachycardia: Secondary | ICD-10-CM

## 2013-01-16 LAB — MDC_IDC_ENUM_SESS_TYPE_INCLINIC
Lead Channel Impedance Value: 495 Ohm
Lead Channel Pacing Threshold Amplitude: 0.3 V
Lead Channel Pacing Threshold Amplitude: 1.2 V
Lead Channel Pacing Threshold Pulse Width: 0.4 ms
Lead Channel Sensing Intrinsic Amplitude: 1.8 mV
Lead Channel Sensing Intrinsic Amplitude: 22.9 mV
Lead Channel Setting Pacing Amplitude: 2 V
Lead Channel Setting Pacing Amplitude: 2.4 V
Lead Channel Setting Pacing Pulse Width: 0.4 ms
Lead Channel Setting Sensing Sensitivity: 0.5 mV

## 2013-01-16 NOTE — Progress Notes (Signed)
ICD check in clinic. Normal device function. Thresholds and sensing consistent with previous device measurements. Impedance trends stable over time. 1 NSVT. No mode switches. Histogram distribution appropriate for patient and level of activity. No changes made this session. Device programmed at appropriate safety margins. Device programmed to optimize intrinsic conduction. Estimated longevity 9 years.  Patient education completed including shock plan. Alert tones/vibration demonstrated for patient.

## 2013-01-23 ENCOUNTER — Other Ambulatory Visit: Payer: Self-pay

## 2013-01-23 DIAGNOSIS — F329 Major depressive disorder, single episode, unspecified: Secondary | ICD-10-CM

## 2013-01-23 MED ORDER — FLUOXETINE HCL (PMDD) 10 MG PO CAPS: 1.0000 | ORAL_CAPSULE | Freq: Every day | ORAL | Status: DC

## 2013-01-23 MED ORDER — TEMAZEPAM 15 MG PO CAPS: 15.0000 mg | ORAL_CAPSULE | Freq: Every day | ORAL | Status: DC

## 2013-02-04 ENCOUNTER — Telehealth: Payer: Self-pay | Admitting: Family Medicine

## 2013-02-04 ENCOUNTER — Other Ambulatory Visit: Payer: Self-pay | Admitting: Family Medicine

## 2013-02-04 MED ORDER — FLUOXETINE HCL 20 MG PO CAPS: 20.0000 mg | ORAL_CAPSULE | Freq: Every day | ORAL | Status: DC

## 2013-02-04 NOTE — Telephone Encounter (Signed)
Going through disability info with pt. Last worked 10/09/2012 Had left eye surgery for retinal detachment in august which was repeated in October 2014, has 20/50 vision in left eye. Had right eye retinal detatchment in September 2014, surgery planned for 03/2013. Blood sugar still uncontrolled, seeing endocrine  Depression still severe, since last visit his sister needed CABG, Mother had hand infection, seeing therapist, will send msg for increasing fluoxetine to 20mg  dose as he is only on 10 mg will go ahead and call him back and advise the dose increase effective today  Ongoing issue with CAD. Anticipate 9 months out , return to work date tentativiley documented for Jul 09, 2013

## 2013-02-07 ENCOUNTER — Ambulatory Visit (HOSPITAL_COMMUNITY): Payer: Self-pay | Admitting: Psychology

## 2013-02-25 ENCOUNTER — Ambulatory Visit (HOSPITAL_COMMUNITY): Payer: Self-pay | Admitting: Psychology

## 2013-03-04 ENCOUNTER — Ambulatory Visit: Payer: BC Managed Care – PPO | Admitting: Family Medicine

## 2013-03-04 ENCOUNTER — Encounter (INDEPENDENT_AMBULATORY_CARE_PROVIDER_SITE_OTHER): Payer: Self-pay

## 2013-03-20 ENCOUNTER — Encounter: Payer: Self-pay | Admitting: Family Medicine

## 2013-03-20 ENCOUNTER — Encounter (INDEPENDENT_AMBULATORY_CARE_PROVIDER_SITE_OTHER): Payer: Self-pay

## 2013-03-20 ENCOUNTER — Ambulatory Visit (INDEPENDENT_AMBULATORY_CARE_PROVIDER_SITE_OTHER): Payer: BC Managed Care – PPO | Admitting: Family Medicine

## 2013-03-20 VITALS — BP 136/78 | HR 78 | Resp 18 | Ht 69.0 in | Wt 278.0 lb

## 2013-03-20 DIAGNOSIS — F419 Anxiety disorder, unspecified: Secondary | ICD-10-CM

## 2013-03-20 DIAGNOSIS — R0989 Other specified symptoms and signs involving the circulatory and respiratory systems: Secondary | ICD-10-CM

## 2013-03-20 DIAGNOSIS — G4700 Insomnia, unspecified: Secondary | ICD-10-CM

## 2013-03-20 DIAGNOSIS — E1129 Type 2 diabetes mellitus with other diabetic kidney complication: Secondary | ICD-10-CM

## 2013-03-20 DIAGNOSIS — I10 Essential (primary) hypertension: Secondary | ICD-10-CM

## 2013-03-20 DIAGNOSIS — M25551 Pain in right hip: Secondary | ICD-10-CM

## 2013-03-20 DIAGNOSIS — F418 Other specified anxiety disorders: Secondary | ICD-10-CM

## 2013-03-20 DIAGNOSIS — R296 Repeated falls: Secondary | ICD-10-CM

## 2013-03-20 DIAGNOSIS — M25559 Pain in unspecified hip: Secondary | ICD-10-CM

## 2013-03-20 DIAGNOSIS — E1165 Type 2 diabetes mellitus with hyperglycemia: Principal | ICD-10-CM

## 2013-03-20 DIAGNOSIS — F341 Dysthymic disorder: Secondary | ICD-10-CM

## 2013-03-20 DIAGNOSIS — I5022 Chronic systolic (congestive) heart failure: Secondary | ICD-10-CM

## 2013-03-20 DIAGNOSIS — Z23 Encounter for immunization: Secondary | ICD-10-CM

## 2013-03-20 DIAGNOSIS — R42 Dizziness and giddiness: Secondary | ICD-10-CM

## 2013-03-20 DIAGNOSIS — Z9181 History of falling: Secondary | ICD-10-CM

## 2013-03-20 DIAGNOSIS — F411 Generalized anxiety disorder: Secondary | ICD-10-CM

## 2013-03-20 DIAGNOSIS — H547 Unspecified visual loss: Secondary | ICD-10-CM

## 2013-03-20 DIAGNOSIS — I2510 Atherosclerotic heart disease of native coronary artery without angina pectoris: Secondary | ICD-10-CM

## 2013-03-20 DIAGNOSIS — IMO0002 Reserved for concepts with insufficient information to code with codable children: Secondary | ICD-10-CM

## 2013-03-20 DIAGNOSIS — E785 Hyperlipidemia, unspecified: Secondary | ICD-10-CM

## 2013-03-20 DIAGNOSIS — E1121 Type 2 diabetes mellitus with diabetic nephropathy: Secondary | ICD-10-CM

## 2013-03-20 NOTE — Patient Instructions (Addendum)
F/u in 2.5 month, call if you need me befopre  Congrats on improved hBA1C, keep it up, we will send for your labs from dr Dorris Fetch today  You are referred to psychiatrist for management of depression.  You will be referred to othopedics for evaluation and management of right hip  You will be referred to Neurologist for disequilibrium with repeated falls  Increase fluoxetine to 40mg  once daily, take two 20mg  tablets at one time, until yoyu see the psychiatrist , who will take over your mental care   Increase restoril to 30mg  at bedtime for help with, take TWO 15 mg capsules, again , psychiatry will take over your care once established

## 2013-03-20 NOTE — Progress Notes (Signed)
   Subjective:    Patient ID: Matthew [name], male    DOB: 12-13-1965, 48 y.o.   MRN: [id number]  HPI Pt has to postponed needed right eye surgery for financial reasons, will be doing injection and laser treatments in the interim, ,  Able to see a blur from right eye, only peripheral no central vision from right eye. Left eye has about 20/50, which just barely allows him to do day driving, the left eye has had 2 previous surgeries. Diabetes has improved to an 8.1, saw endo this week. 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 Not suicidal or homicidal, but anxious, withdrawn, anergic, poor sleep , easily tearful when he allows his "front " to go down. C/o recurrent falls in the past several weeks with right hip pain and a :drifting to the right ' when walks , states he has a sense of disequilibrium, may be related to vision loss as well as hip problems, with all his vascular issues , possibility of cerebrovascular disease is real and will have neurology consult   Review of Systems See HPI Denies recent fever or chills. Denies sinus pressure, nasal congestion, ear pain or sore throat. Denies chest congestion, productive cough or wheezing. Denies recent chest pain, PND or oirthopnea Denies abdominal pain, nausea, vomiting,diarrhea or constipation.   Denies dysuria, frequency, hesitancy or incontinence.  Denies  seizures, . Denies skin break down or rash.        Objective:   Physical Exam  Patient alert and oriented and in no cardiopulmonary distress.  HEENT: No facial asymmetry, EOMI, no sinus tenderness,  oropharynx pink and moist.  Neck supple no adenopathy.Carotid bruit left, no JVD  Chest: Clear to auscultation  bilaterally.  CVS: S1, S2 no murmurs, no S3.  ABD: Soft non tender.   Ext: No edema  Skin: Intact, no ulcerations or rash noted.  Psych: Good eye contact, flat  affect. Memory impaired mildly  anxious and  depressed appearing.  CNS: CN 2-12 intact,marked loss of vision in right eye, power,  normal throughout.       Assessment & Plan:

## 2013-03-23 DIAGNOSIS — R42 Dizziness and giddiness: Secondary | ICD-10-CM | POA: Insufficient documentation

## 2013-03-23 DIAGNOSIS — M25551 Pain in right hip: Secondary | ICD-10-CM | POA: Insufficient documentation

## 2013-03-23 NOTE — Assessment & Plan Note (Signed)
Denies current or recent episode of chest pain requiring NTG use in the past 3 weeks. Recently increased mental and emotional stress witnessing his younger sister require CABG

## 2013-03-23 NOTE — Assessment & Plan Note (Signed)
Uncontrolled , will refer to psychiatry for med management

## 2013-03-23 NOTE — Assessment & Plan Note (Signed)
C/O increased right hip pain with reduced mobility progressive over past several month, requests ortho eval for same, also describes disequilibrium , drifting to the right when walking, will have neuro eval for this

## 2013-03-23 NOTE — Assessment & Plan Note (Signed)
Ongoing challenge esp in left eye, still has had no surgery due to financial constraints, he has had 2 surgeries in right eye  With just sufficient vision  To permit daylight driving with restrictions on speed and distance

## 2013-03-23 NOTE — Assessment & Plan Note (Signed)
Hyperlipidemia:Low fat diet discussed and encouraged.  Need updated lab, will send for this from endo

## 2013-03-23 NOTE — Assessment & Plan Note (Signed)
No significant improvement in symptoms, not suicidal or homicidal, does see therapist, need help of psychiatry  for med management, pt agreeable. Was also recently denied disability despite multiple medical conditions including significant vision loss in left eye

## 2013-03-23 NOTE — Assessment & Plan Note (Signed)
Controlled, no change in medication  

## 2013-03-23 NOTE — Assessment & Plan Note (Signed)
Improving under care of endocrinology Patient advised to reduce carb and sweets, commit to regular physical activity, take meds as prescribed, test blood as directed, and attempt to lose weight, to improve blood sugar control.

## 2013-03-23 NOTE — Assessment & Plan Note (Signed)
Currently stable no symptoms or signs of decompensation at this time, f/u cardiology as planned

## 2013-03-23 NOTE — Assessment & Plan Note (Signed)
Pt reports a several week h/o noting "a drift" when walking " toward the right and reports several falls as a result of this , will refer for neurology to evaluate this further

## 2013-03-28 ENCOUNTER — Ambulatory Visit (INDEPENDENT_AMBULATORY_CARE_PROVIDER_SITE_OTHER): Payer: BC Managed Care – PPO | Admitting: Psychology

## 2013-03-28 DIAGNOSIS — F32A Depression, unspecified: Secondary | ICD-10-CM

## 2013-03-28 DIAGNOSIS — F3289 Other specified depressive episodes: Secondary | ICD-10-CM

## 2013-03-28 DIAGNOSIS — F411 Generalized anxiety disorder: Secondary | ICD-10-CM

## 2013-03-28 DIAGNOSIS — F4001 Agoraphobia with panic disorder: Secondary | ICD-10-CM

## 2013-03-28 DIAGNOSIS — F419 Anxiety disorder, unspecified: Secondary | ICD-10-CM

## 2013-03-28 DIAGNOSIS — F329 Major depressive disorder, single episode, unspecified: Secondary | ICD-10-CM

## 2013-03-28 NOTE — Progress Notes (Signed)
Patient:  Matthew [name]   DOB: 11/13/1965  MR Number: [id number]  Location: [facility] [address] [place] [address] Dept: [phone]  Start: 11 AM End: 12 PM  Provider/Observer:     Edgardo Roys PSYD  Chief Complaint:      Chief Complaint  Patient presents with  . Anxiety  . Depression    Reason For Service:     The patient was referred because of issues related to anxiety and depression. Dr. Moshe Cipro referred him for psychotherapeutic interventions. The patient reports that he is dealing with sleeplessness, anxiety, and restlessness. The patient reports that the symptoms have been progressively worsening. The patient reports that he has developed severe neuropathy and also suffered a detached retina. The patient reports that both of his eyes have been causing a great deal of difficulty. 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 The patient reports that he started having anxiety attacks when he would recall experiences of his heart attack. The patient also describes history of multiple head traumas including the head trauma provide accident as well as potential anoxic events. The patient is currently on short-term disability and is just filled out his paperwork for Social Security disability. The patient lost his last because of severe memory issues and anxiety issues.    Interventions Strategy:  Cognitive/behavioral psychotherapeutic interventions  Participation Level:   Active  Participation Quality:  Appropriate      Behavioral Observation:  Well Groomed, Alert, and Appropriate.   Current Psychosocial Factors: Patient's sister had bypass surgery, which  reminded patient of his time in hospital.   Content of Session:   Reviewed current symptoms and continued work on therapeutic interventions.   Current Status:   The patient reports that depression has been much more severe and anxiety worse.  Patient using breathing exercises to help with anxiety.  Patient setting up appointment with Dr. Harrington Challenger Feb 10th.  Patient increased to 40 mg prozac for two weeks.  Not side-effects at 40mg .  Patient Progress:    stable  Target Goals:    target goals include reducing the overall symptoms of anxiety and agitation as well as symptoms of depression as well as help with coping skills particular and his numerous severe medical issues.  Last Reviewed:    03/28/2013  Goals Addressed Today:     Worked on issues related to better coping skills and strategies.  Impression/Diagnosis:   the patient has a number of significant medical issues that he is coping with it has been having anxiety and depression prior and has been exacerbated by his current difficulties. He has developed some panic attacks and anxiety as well. We'll set the patient for individual psychotherapeutic interventions.   Diagnosis:    Axis I: Panic disorder with agoraphobia  Depressive disorder  Anxiety disorder

## 2013-04-08 ENCOUNTER — Ambulatory Visit (INDEPENDENT_AMBULATORY_CARE_PROVIDER_SITE_OTHER): Payer: BC Managed Care – PPO | Admitting: Neurology

## 2013-04-08 ENCOUNTER — Encounter: Payer: Self-pay | Admitting: Neurology

## 2013-04-08 VITALS — BP 143/82 | HR 74 | Ht 69.0 in | Wt 289.0 lb

## 2013-04-08 DIAGNOSIS — E1142 Type 2 diabetes mellitus with diabetic polyneuropathy: Secondary | ICD-10-CM

## 2013-04-08 DIAGNOSIS — R269 Unspecified abnormalities of gait and mobility: Secondary | ICD-10-CM | POA: Insufficient documentation

## 2013-04-08 DIAGNOSIS — D518 Other vitamin B12 deficiency anemias: Secondary | ICD-10-CM

## 2013-04-08 DIAGNOSIS — R42 Dizziness and giddiness: Secondary | ICD-10-CM

## 2013-04-08 DIAGNOSIS — M216X9 Other acquired deformities of unspecified foot: Secondary | ICD-10-CM

## 2013-04-08 DIAGNOSIS — M21372 Foot drop, left foot: Secondary | ICD-10-CM | POA: Insufficient documentation

## 2013-04-08 DIAGNOSIS — M21371 Foot drop, right foot: Secondary | ICD-10-CM

## 2013-04-08 HISTORY — DX: Foot drop, right foot: M21.371

## 2013-04-08 HISTORY — DX: Type 2 diabetes mellitus with diabetic polyneuropathy: E11.42

## 2013-04-08 HISTORY — DX: Unspecified abnormalities of gait and mobility: R26.9

## 2013-04-08 NOTE — Patient Instructions (Signed)

## 2013-04-08 NOTE — Progress Notes (Signed)
Reason for visit: Dizziness, gait instability  Matthew [name] is a 48 y.o. male  History of present illness:  Mr. Henriques is a 48 year old left-handed white male with a history of obesity, diabetes, diabetic peripheral neuropathy, and a gait disturbance. The patient has a history of coronary artery disease as well. The patient gives a 5-10 year history of intermittent episodes of lightheaded sensations and some gait instability. The patient indicates that these episodes may last 1 or 2 minutes and then clear. The patient occasionally will have dizziness and nervousness associated with hypoglycemic events. The patient does not clearly relate the episodes of dizziness to any sudden change in body position, stooping, or any association with any of his medications. The patient may have episodes several times a week, and at times he may have one or 2 episodes a day. The patient indicates that if he sits down during an episode, he can abort the dizziness more rapidly. The patient occasionally will have visual dimming with the events. The patient not had any blackout episodes. The patient has chronic numbness in the feet associated with a peripheral neuropathy. The patient also has bilateral ulnar neuropathies and right carpal tunnel syndrome. The patient denies problems controlling the bowels or the bladder, and he reports some generalized issues with fatigue. The patient has been set for a carotid doppler study, but this has not yet been done. The patient is sent to this office for further evaluation of the above episodes. The patient has no slurred speech or confusion with the events.  Past Medical History  Diagnosis Date  . Cardiac arrest - ventricular fibrillation 08/26/2009  . CAD (coronary artery disease)     a. OOH MI 6/11; presented with acute CHF; hosp course c/b VF arrest with VDRF, etc;   b s/p CABG: L-LAD/Dx, S-OM/dCFX;  c.Nuclear scan 8/12:  Inferior and inf-lat scar with minimal peri-infarct  ischemia, EF 53%.; d.LHC 9/12:  LAD 90%, pD1 (small) 70%, prox large Dx 95%, L-LAD and Dx ok, CFX occluded, S-OM and CFX ok, RCA prox to prox/mid 60-70%.  Medical management     . Ischemic cardiomyopathy     echo 10/11:  inf-septal and apical HK, EF 35%, mod LAE  . Diabetic coma with ketoacidosis     Occured in 1994 where he spent 30 days in a coma . He develpoed infection of his leg and developing pancreatitis during that  time   . DM2 (diabetes mellitus, type 2)   . Cellulitis 2012    spider bite  . HTN (hypertension)   . HLD (hyperlipidemia)   . Chronic systolic heart failure   . Thyroid disease   . Coma   . Headache(784.0)     sinus  . Anxiety     stress  . CKD (chronic kidney disease)   . Neuropathy   . Carpal tunnel syndrome of right wrist   . 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 Abnormality of gait 04/08/2013  . Polyneuropathy in diabetes(357.2) 04/08/2013    Past Surgical History  Procedure Laterality Date  . Coronary artery bypass grafting x4  june 29,2011    x 4  . Tonsillectomy  1998  . Appendectomy  mid 1990  . Right sided abdominal cyst removal    . Carpal tunnel with cubital tunnel  02/22/2012    Procedure: CARPAL TUNNEL WITH CUBITAL TUNNEL;  Surgeon: Roseanne Kaufman, MD;  Location: [facility];  Service: Orthopedics;  Laterality: Right;  Right Carpal  Tunnel Release/Right Cubital Tunnel Release and Transposition if Necessary with Flexor Pronator Release  . Cardiac surgery    . Pacemaker insertion      Family History  Problem Relation Age of Onset  . Cancer Maternal Grandmother   . Allergies Mother   . Diabetes Mother   . Hypertension Mother   . Hyperlipidemia Mother   . Stroke Father   . Diabetes Sister   . Diabetes Sister   . Allergies Father   . Allergies Sister     Social history:  reports that he has never smoked. His smokeless tobacco use includes Snuff. He reports that he drinks alcohol. He reports that he does not use illicit drugs.  Medications:   Current Outpatient Prescriptions on File Prior to Visit  Medication Sig Dispense Refill  . aspirin 325 MG tablet Take 325 mg by mouth daily.        . Chromium Picolinate 200 MCG CAPS Take 200 mcg by mouth daily.       . Cinnamon 500 MG TABS Take 1,000 mg by mouth daily.       Marland Kitchen FLUoxetine (PROZAC) 40 MG capsule Take 1 capsule (40 mg total) by mouth daily.  30 capsule  2  . Fluoxetine HCl, PMDD, 10 MG CAPS Take 1 capsule (10 mg total) by mouth daily.  90 each  1  . furosemide (LASIX) 40 MG tablet Take 1 tablet (40 mg total) by mouth daily.  90 tablet  3  . gabapentin (NEURONTIN) 300 MG capsule Take 300 mg by mouth At bedtime.      . Insulin Glargine (LANTUS SOLOSTAR) 100 UNIT/ML SOPN 70 units at bedtime  21 pen  3  . insulin lispro (HUMALOG) 100 UNIT/ML injection Inject 12 Units into the skin 3 (three) times daily before meals. Sliding scale      . Liraglutide 18 MG/3ML SOPN Inject into the skin daily. Dosed by dr. Dorris Fetch      . methylcellulose (ARTIFICIAL TEARS) 1 % ophthalmic solution Place 1 drop into both eyes 2 (two) times daily as needed (dry eyes).      . metoprolol tartrate (LOPRESSOR) 25 MG tablet Take 1 tablet (25 mg total) by mouth 2 (two) times daily.  180 tablet  3  . Multiple Vitamin (MULTIVITAMIN) tablet Take 1 tablet by mouth daily.        . Omega-3 Fatty Acids (FISH OIL BURP-LESS PO) Take 1 tablet by mouth daily.        . rosuvastatin (CRESTOR) 10 MG tablet TAKE ONE TABLET BY MOUTH EVERY DAY  90 tablet  3  . temazepam (RESTORIL) 30 MG capsule Take 1 capsule (30 mg total) by mouth at bedtime as needed for sleep.  30 capsule  0  . losartan (COZAAR) 50 MG tablet Take 1 tablet (50 mg total) by mouth daily.  30 tablet  3  . nitroGLYCERIN (NITROSTAT) 0.4 MG SL tablet Place 0.4 mg under the tongue every 5 (five) minutes as needed for chest pain.       No current facility-administered medications on file prior to visit.      Allergies  Allergen Reactions  . Ace Inhibitors Cough     ROS:  Out of a complete 14 system review of symptoms, the patient complains only of the following symptoms, and all other reviewed systems are negative.  Weight gain, fatigue Chest pain, palpitations, swelling in the legs Dizziness Birthmarks Blurred vision, double vision, loss of vision Shortness of breath, cough, snoring Urinary  incontinence and impotence Feeling hot, cold, increased thirst Joint pain, muscle cramps Allergies, runny nose Memory loss, confusion, headache, numbness, weakness, dizziness, tremor Depression, anxiety, insomnia, disinterest in activities, racing thoughts  Blood pressure 143/82, pulse 74, height 5\' 9"  (1.753 m), weight 289 lb (131.09 kg).  Blood pressure, right arm, sitting is 158/92. Blood pressure, standing, right arm is 154/88.  Physical Exam  General: The patient is alert and cooperative at the time of the examination. The patient is markedly obese.  Eyes: Pupils are equal, round, and reactive to light. Discs are flat bilaterally.  Neck: The neck is supple, no carotid bruits are noted.  Respiratory: The respiratory examination is clear.  Cardiovascular: The cardiovascular examination reveals a regular rate and rhythm, no obvious murmurs or rubs are noted.  Neuromuscular: Significant atrophy of the first dorsal interossei is seen bilaterally in the hands, left greater right.  Skin: Extremities are with 2-3+ edema below the knees bilaterally.  Neurologic Exam  Mental status: The patient is alert and oriented x 3 at the time of the examination. The patient has apparent normal recent and remote memory, with an apparently normal attention span and concentration ability. Mini-Mental status examination done today shows a total score of 30/30.  Cranial nerves: Facial symmetry is present. There is good sensation of the face to pinprick and soft touch bilaterally. The strength of the facial muscles and the muscles to head turning and shoulder shrug  are normal bilaterally. Speech is well enunciated, no aphasia or dysarthria is noted. Extraocular movements are full. Visual fields are full. The tongue is midline, and the patient has symmetric elevation of the soft palate. No obvious hearing deficits are noted.  Motor: The motor testing reveals 5 over 5 strength of all 4 extremities, with exception that there are mild bilateral foot drops. Good symmetric motor tone is noted throughout.  Sensory: Sensory testing is intact to pinprick, soft touch, vibration sensation, and position sense on the upper extremities. With the lower extremities, there is a stocking pattern pinprick sensory deficit one half way up the legs below the knees. Vibration sensation and position sensation are significantly impaired in the feet. No evidence of extinction is noted.  Coordination: Cerebellar testing reveals good finger-nose-finger and heel-to-shin bilaterally.  Gait and station: Gait is slightly wide-based. Tandem gait is unsteady. Romberg is negative. No drift is seen.  Reflexes: Deep tendon reflexes are symmetric, but are depressed bilaterally. Toes are downgoing bilaterally.   Assessment/Plan:  1. Diabetes  2. Diabetic peripheral neuropathy  3. Episodic dizziness, gait instability  4. Obesity  The patient has had a long duration history of brief episodes of dizziness and gait instability. The patient generally has these events while standing. The episodes could potentially represent a transient drop in blood pressure, but the patient also has a significant peripheral neuropathy that should impact his ability to ambulate. The episodes of "dizziness" may represent episodes of sensation of imbalance. The patient will be set up for a CT scan of the brain. The patient has already been set up for carotid Doppler studies. Blood work will be done today. The patient will followup in 4 months.  Jill Alexanders MD 04/08/2013 2:41 PM  [facility] [address] [place], [place] [address]  Phone [phone] Fax [phone]

## 2013-04-11 ENCOUNTER — Telehealth: Payer: Self-pay | Admitting: Neurology

## 2013-04-11 LAB — RHEUMATOID FACTOR: RHEUMATOID FACTOR: 10.9 [IU]/mL (ref 0.0–13.9)

## 2013-04-11 LAB — ENA+DNA/DS+SJORGEN'S
ENA RNP Ab: 1.5 AI — ABNORMAL HIGH (ref 0.0–0.9)
ENA SSA (RO) Ab: 0.4 AI (ref 0.0–0.9)
ENA SSB (LA) Ab: <0.2 AI (ref 0.0–0.9)
dsDNA Ab: 1 IU/mL (ref 0–9)

## 2013-04-11 LAB — VITAMIN B12: Vitamin B-12: 505 pg/mL (ref 211–946)

## 2013-04-11 LAB — IFE AND PE, SERUM
Albumin SerPl Elph-Mcnc: 3.8 g/dL (ref 3.2–5.6)
Albumin/Glob SerPl: 1.2 (ref 0.7–2.0)
Alpha 1: 0.2 g/dL (ref 0.1–0.4)
Alpha2 Glob SerPl Elph-Mcnc: 0.8 g/dL (ref 0.4–1.2)
B-Globulin SerPl Elph-Mcnc: 1 g/dL (ref 0.6–1.3)
GLOBULIN, TOTAL: 3.2 g/dL (ref 2.0–4.5)
Gamma Glob SerPl Elph-Mcnc: 1.1 g/dL (ref 0.5–1.6)
IGA/IMMUNOGLOBULIN A, SERUM: 260 mg/dL (ref 91–414)
IGM (IMMUNOGLOBULIN M), SRM: 188 mg/dL (ref 40–230)
IgG (Immunoglobin G), Serum: 978 mg/dL (ref 700–1600)
TOTAL PROTEIN: 7 g/dL (ref 6.0–8.5)

## 2013-04-11 LAB — RPR: RPR: NONREACTIVE

## 2013-04-11 LAB — LYME, TOTAL AB TEST/REFLEX: Lyme IgG/IgM Ab: <0.91 {ISR} (ref 0.00–0.90)

## 2013-04-11 LAB — ANA W/REFLEX: ANA: POSITIVE — AB

## 2013-04-11 LAB — ANGIOTENSIN CONVERTING ENZYME: Angio Convert Enzyme: 53 U/L (ref 14–82)

## 2013-04-11 NOTE — Telephone Encounter (Signed)
I called patient. The blood work was essentially normal. ANA was positive, but the RNP aspect of this was in low titer.

## 2013-04-15 ENCOUNTER — Encounter (HOSPITAL_COMMUNITY): Payer: Self-pay | Admitting: Psychiatry

## 2013-04-15 ENCOUNTER — Ambulatory Visit (INDEPENDENT_AMBULATORY_CARE_PROVIDER_SITE_OTHER): Payer: BC Managed Care – PPO | Admitting: Psychiatry

## 2013-04-15 VITALS — BP 170/92 | Ht 69.0 in | Wt 274.0 lb

## 2013-04-15 DIAGNOSIS — F329 Major depressive disorder, single episode, unspecified: Secondary | ICD-10-CM

## 2013-04-15 DIAGNOSIS — F3289 Other specified depressive episodes: Secondary | ICD-10-CM

## 2013-04-15 DIAGNOSIS — F32A Depression, unspecified: Secondary | ICD-10-CM

## 2013-04-15 MED ORDER — CLONAZEPAM 0.5 MG PO TABS: 0.5000 mg | ORAL_TABLET | Freq: Every day | ORAL | Status: DC | PRN

## 2013-04-15 MED ORDER — MIRTAZAPINE 30 MG PO TABS: 30.0000 mg | ORAL_TABLET | Freq: Every day | ORAL | Status: DC
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
## 2013-04-17 ENCOUNTER — Ambulatory Visit (INDEPENDENT_AMBULATORY_CARE_PROVIDER_SITE_OTHER): Payer: BC Managed Care – PPO | Admitting: Psychology

## 2013-04-17 ENCOUNTER — Encounter (HOSPITAL_COMMUNITY): Payer: Self-pay | Admitting: Psychology

## 2013-04-17 DIAGNOSIS — F3289 Other specified depressive episodes: Secondary | ICD-10-CM

## 2013-04-17 DIAGNOSIS — F329 Major depressive disorder, single episode, unspecified: Secondary | ICD-10-CM

## 2013-04-17 DIAGNOSIS — F32A Depression, unspecified: Secondary | ICD-10-CM

## 2013-04-17 DIAGNOSIS — F4001 Agoraphobia with panic disorder: Secondary | ICD-10-CM
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
## 2013-04-18 ENCOUNTER — Ambulatory Visit
Admission: RE | Admit: 2013-04-18 | Discharge: 2013-04-18 | Disposition: A | Discharge status: Home / Self Care | Payer: BC Managed Care – PPO | Source: Ambulatory Visit | Attending: Neurology | Admitting: Neurology

## 2013-04-18 DIAGNOSIS — D518 Other vitamin B12 deficiency anemias: Secondary | ICD-10-CM

## 2013-04-18 DIAGNOSIS — R269 Unspecified abnormalities of gait and mobility: Secondary | ICD-10-CM

## 2013-04-18 DIAGNOSIS — R42 Dizziness and giddiness: Secondary | ICD-10-CM

## 2013-04-18 DIAGNOSIS — E1142 Type 2 diabetes mellitus with diabetic polyneuropathy: Secondary | ICD-10-CM

## 2013-04-21 ENCOUNTER — Telehealth: Payer: Self-pay | Admitting: Neurology

## 2013-04-21 NOTE — Telephone Encounter (Signed)
I called patient. The CT scan of brain is unremarkable. Carotid Doppler studies have been set up, I do not have the results of these.  CT brain 04/21/2013:  Impression   Normal CT scan of the head without contrast

## 2013-04-23 ENCOUNTER — Ambulatory Visit: Payer: BC Managed Care – PPO | Admitting: *Deleted

## 2013-04-23 ENCOUNTER — Encounter (INDEPENDENT_AMBULATORY_CARE_PROVIDER_SITE_OTHER): Payer: Self-pay

## 2013-04-24 ENCOUNTER — Encounter: Payer: Self-pay | Admitting: Internal Medicine

## 2013-04-24 DIAGNOSIS — I4901 Ventricular fibrillation: Secondary | ICD-10-CM

## 2013-04-24 LAB — MDC_IDC_ENUM_SESS_TYPE_INCLINIC
Battery Remaining Longevity: 9
Brady Statistic RA Percent Paced: 1 % — CL
Brady Statistic RV Percent Paced: 0 %
Date Time Interrogation Session: 20150218050000
HighPow Impedance: 28 Ohm
HighPow Impedance: 54 Ohm
Lead Channel Impedance Value: 471 Ohm
Lead Channel Impedance Value: 475 Ohm
Lead Channel Pacing Threshold Amplitude: 0.5 V
Lead Channel Sensing Intrinsic Amplitude: 23.8 mV
Lead Channel Sensing Intrinsic Amplitude: 4.4 mV
Lead Channel Setting Pacing Amplitude: 2.4 V
Lead Channel Setting Pacing Pulse Width: 0.4 ms
Lead Channel Setting Sensing Sensitivity: 0.5 mV
MDC IDC MSMT LEADCHNL RA PACING THRESHOLD PULSEWIDTH: 0.4 ms
MDC IDC MSMT LEADCHNL RV PACING THRESHOLD AMPLITUDE: 0.9 V
MDC IDC MSMT LEADCHNL RV PACING THRESHOLD PULSEWIDTH: 0.4 ms
MDC IDC PG SERIAL: 168307
MDC IDC SET LEADCHNL RA PACING AMPLITUDE: 2 V
MDC IDC SET ZONE DETECTION INTERVAL: 286 ms
Zone Setting Detection Interval: 333 ms

## 2013-05-13 ENCOUNTER — Ambulatory Visit (INDEPENDENT_AMBULATORY_CARE_PROVIDER_SITE_OTHER): Payer: BC Managed Care – PPO | Admitting: Orthopedic Surgery

## 2013-05-13 ENCOUNTER — Ambulatory Visit (INDEPENDENT_AMBULATORY_CARE_PROVIDER_SITE_OTHER): Payer: BC Managed Care – PPO

## 2013-05-13 ENCOUNTER — Encounter (HOSPITAL_COMMUNITY): Payer: Self-pay | Admitting: Psychiatry

## 2013-05-13 ENCOUNTER — Encounter: Payer: Self-pay | Admitting: Orthopedic Surgery

## 2013-05-13 ENCOUNTER — Ambulatory Visit (INDEPENDENT_AMBULATORY_CARE_PROVIDER_SITE_OTHER): Payer: BC Managed Care – PPO | Admitting: Psychiatry

## 2013-05-13 VITALS — BP 144/72 | Ht 69.0 in | Wt 285.0 lb

## 2013-05-13 VITALS — BP 140/80 | Ht 69.0 in | Wt 283.0 lb

## 2013-05-13 DIAGNOSIS — M5430 Sciatica, unspecified side: Secondary | ICD-10-CM

## 2013-05-13 DIAGNOSIS — M549 Dorsalgia, unspecified: Secondary | ICD-10-CM

## 2013-05-13 DIAGNOSIS — F329 Major depressive disorder, single episode, unspecified: Secondary | ICD-10-CM

## 2013-05-13 DIAGNOSIS — M25559 Pain in unspecified hip: Secondary | ICD-10-CM

## 2013-05-13 DIAGNOSIS — F3289 Other specified depressive episodes: Secondary | ICD-10-CM

## 2013-05-13 DIAGNOSIS — F4001 Agoraphobia with panic disorder: Secondary | ICD-10-CM

## 2013-05-13 DIAGNOSIS — M5410 Radiculopathy, site unspecified: Secondary | ICD-10-CM | POA: Insufficient documentation

## 2013-05-13 DIAGNOSIS — M25551 Pain in right hip: Secondary | ICD-10-CM

## 2013-05-13 DIAGNOSIS — F32A Depression, unspecified: Secondary | ICD-10-CM

## 2013-05-13 DIAGNOSIS — F418 Other specified anxiety disorders: Secondary | ICD-10-CM

## 2013-05-13 DIAGNOSIS — IMO0002 Reserved for concepts with insufficient information to code with codable children: Secondary | ICD-10-CM

## 2013-05-13 DIAGNOSIS — F411 Generalized anxiety disorder: Secondary | ICD-10-CM

## 2013-05-13 MED ORDER — GABAPENTIN 300 MG PO CAPS
300.0000 mg | ORAL_CAPSULE | Freq: Every day | ORAL | Status: DC
Start: 2013-05-13 — End: 2015-04-22

## 2013-05-13 MED ORDER — MIRTAZAPINE 30 MG PO TABS: 30.0000 mg | ORAL_TABLET | Freq: Every day | ORAL | Status: DC

## 2013-05-13 MED ORDER — CLONAZEPAM 0.5 MG PO TABS: 0.5000 mg | ORAL_TABLET | Freq: Every day | ORAL | Status: DC | PRN

## 2013-05-13 MED ORDER — FLUOXETINE HCL 40 MG PO CAPS: 40.0000 mg | ORAL_CAPSULE | Freq: Every day | ORAL | Status: DC

## 2013-05-13 NOTE — Patient Instructions (Signed)
Call to arrange therapy  Pick up prescription up at pharmacy
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
## 2013-05-15 ENCOUNTER — Ambulatory Visit (INDEPENDENT_AMBULATORY_CARE_PROVIDER_SITE_OTHER): Payer: BC Managed Care – PPO | Admitting: Psychology

## 2013-05-15 ENCOUNTER — Encounter (HOSPITAL_COMMUNITY): Payer: Self-pay | Admitting: Psychology

## 2013-05-15 DIAGNOSIS — F329 Major depressive disorder, single episode, unspecified: Secondary | ICD-10-CM

## 2013-05-15 DIAGNOSIS — F32A Depression, unspecified: Secondary | ICD-10-CM

## 2013-05-15 DIAGNOSIS — F4001 Agoraphobia with panic disorder: Secondary | ICD-10-CM

## 2013-05-15 DIAGNOSIS — F3289 Other specified depressive episodes: Secondary | ICD-10-CM
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
## 2013-05-19 ENCOUNTER — Ambulatory Visit (HOSPITAL_COMMUNITY): Payer: BC Managed Care – PPO | Admitting: Physical Therapy

## 2013-05-21 ENCOUNTER — Ambulatory Visit (HOSPITAL_COMMUNITY)
Admission: RE | Admit: 2013-05-21 | Discharge: 2013-05-21 | Disposition: A | Discharge status: Home / Self Care | Payer: BC Managed Care – PPO | Source: Ambulatory Visit | Attending: Orthopedic Surgery | Admitting: Orthopedic Surgery

## 2013-05-21 DIAGNOSIS — Z794 Long term (current) use of insulin: Secondary | ICD-10-CM | POA: Insufficient documentation

## 2013-05-21 DIAGNOSIS — IMO0001 Reserved for inherently not codable concepts without codable children: Secondary | ICD-10-CM | POA: Insufficient documentation

## 2013-05-21 DIAGNOSIS — F341 Dysthymic disorder: Secondary | ICD-10-CM | POA: Insufficient documentation

## 2013-05-21 DIAGNOSIS — M6281 Muscle weakness (generalized): Secondary | ICD-10-CM | POA: Insufficient documentation

## 2013-05-21 DIAGNOSIS — IMO0002 Reserved for concepts with insufficient information to code with codable children: Secondary | ICD-10-CM | POA: Insufficient documentation

## 2013-05-21 DIAGNOSIS — E1165 Type 2 diabetes mellitus with hyperglycemia: Secondary | ICD-10-CM | POA: Insufficient documentation

## 2013-05-21 DIAGNOSIS — R262 Difficulty in walking, not elsewhere classified: Secondary | ICD-10-CM | POA: Insufficient documentation

## 2013-05-21 DIAGNOSIS — E669 Obesity, unspecified: Secondary | ICD-10-CM | POA: Insufficient documentation

## 2013-05-21 DIAGNOSIS — M545 Low back pain, unspecified: Secondary | ICD-10-CM | POA: Insufficient documentation

## 2013-05-21 DIAGNOSIS — E118 Type 2 diabetes mellitus with unspecified complications: Secondary | ICD-10-CM

## 2013-05-21 NOTE — Evaluation (Signed)
Physical Therapy Evaluation  Patient Details  Name: Matthew [name] MRN: [id number] Date of Birth: 12-16-1965  Today's Date: 05/21/2013 Time: 4401-0272 PT Time Calculation (min): 42 min Charge: eval             Visit#: 1 of 18  Re-eval: 06/20/13 Assessment Diagnosis: radicular back pain  Prior Therapy: none   Past Medical History:  Past Medical History  Diagnosis Date  . Cardiac arrest - ventricular fibrillation 08/26/2009  . CAD (coronary artery disease)     a. OOH MI 6/11; presented with acute CHF; hosp course c/b VF arrest with VDRF, etc;   b s/p CABG: L-LAD/Dx, S-OM/dCFX;  c.Nuclear scan 8/12:  Inferior and inf-lat scar with minimal peri-infarct ischemia, EF 53%.; d.LHC 9/12:  LAD 90%, pD1 (small) 70%, prox large Dx 95%, L-LAD and Dx ok, CFX occluded, S-OM and CFX ok, RCA prox to prox/mid 60-70%.  Medical management     . Ischemic cardiomyopathy     echo 10/11:  inf-septal and apical HK, EF 35%, mod LAE  . Diabetic coma with ketoacidosis     Occured in 1994 where he spent 30 days in a coma . He develpoed infection of his leg and developing pancreatitis during that  time   . DM2 (diabetes mellitus, type 2)   . Cellulitis 2012    spider bite  . HTN (hypertension)   . HLD (hyperlipidemia)   . Chronic systolic heart failure   . Thyroid disease   . Coma   . Headache(784.0)     sinus  . Anxiety     stress  . CKD (chronic kidney disease)   . Neuropathy   . Carpal tunnel syndrome of right wrist   . 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 Abnormality of gait 04/08/2013  . 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 Foot drop, bilateral 04/08/2013  . Obesity   . Anxiety and depression   . Retinal detachment     Bilateral, laser surgery on the right   Past Surgical History:  Past Surgical History  Procedure Laterality Date  . Coronary artery bypass grafting x4  june 29,2011    x 4  . Tonsillectomy  1998  . Appendectomy  mid 1990  . Right sided abdominal cyst removal     . Carpal tunnel with cubital tunnel  02/22/2012    Procedure: CARPAL TUNNEL WITH CUBITAL TUNNEL;  Surgeon: Roseanne Kaufman, MD;  Location: [facility];  Service: Orthopedics;  Laterality: Right;  Right Carpal Tunnel Release/Right Cubital Tunnel Release and Transposition if Necessary with Flexor Pronator Release  . Cardiac surgery    . Pacemaker insertion    . Ulnar nerve transposition Right     Subjective Symptoms/Limitations Symptoms: Matthew [name] states he has had pain for the past ten years which has been progressive in nature.  He hurts more on the right side than the left.  The pain goes down into his right hip.  He has not had any treatment for the pain.  He is now being referred to PT to attempt to decrease his sx.  Pertinent History: Rt eye shadows only in front good peripheral; Lt hazy. Neuropathy hands and feet; pacemaker, defibulator, triple by-pass. Limitations: Sitting How long can you sit comfortably?: Pt is able to shift after about fvie minutes. How long can you stand comfortably?: Able to stand for a few minutes. How long can you walk comfortably?: Pt uses a walking cane due to neuropathy; he is able to  walk for five minutes.  Pain Assessment Currently in Pain?: Yes Pain Score: 6  (highest 8-9; lowest 4) Pain Location: Back Pain Orientation: Right Pain Onset: More than a month ago Pain Frequency: Constant Pain Relieving Factors: stretch  Effect of Pain on Daily Activities: increases    Balance Screening Balance Screen Has the patient fallen in the past 6 months: Yes How many times?: 10 Has the patient had a decrease in activity level because of a fear of falling? : Yes Is the patient reluctant to leave their home because of a fear of falling? : No  Prior Functioning  Prior Function Driving: Yes Vocation: On disability Leisure: Hobbies-yes (Comment) Comments: hiking      Sensation/Coordination/Flexibility/Functional Tests Functional Tests Functional Tests: foto  53  Assessment RLE Strength Right Hip Flexion: 5/5 Right Hip Extension: 5/5 Right Hip ABduction: 4/5 Right Knee Flexion: 5/5 Right Knee Extension:  (5-/5) Right Ankle Dorsiflexion:  (4-/5) LLE Strength Left Hip Flexion: 5/5 Left Hip Extension: 5/5 Left Hip ABduction: 5/5 Left Knee Flexion: 5/5 Left Knee Extension:  (5-/5) Left Ankle Dorsiflexion:  (4-/5) Lumbar AROM Lumbar Flexion: wnl pain both down and back  Lumbar Extension: decreased 15% reps increase pain. Lumbar - Right Side Bend: decreased 20% reps increase pain Lumbar - Left Side Bend: wnl no change with reps Lumbar - Right Rotation: wnl Lumbar - Left Rotation: wnl  Lumbar Strength Lumbar Flexion: 2+/5 Lumbar Extension: 2+/5  Exercise/Treatments     Stretches Single Knee to Chest Stretch: 3 reps;30 seconds Supine Ab Set: 10 reps Glut Set: 10 reps   Physical Therapy Assessment and Plan PT Assessment and Plan Clinical Impression Statement: Pt has had chronic low back pain for ten years that has exacerbated in the past six months to the point that his level of activity is being affected.  PT exam demonstrates decreased ROM and significant weakened core mm.  Pt will benefit from skilled therapy to educate pt in proper exercises; posture and body mechanics to maximize his functioning level. Pt will benefit from skilled therapeutic intervention in order to improve on the following deficits: Decreased activity tolerance;Pain;Difficulty walking;Decreased strength;Obesity;Decreased range of motion Rehab Potential: Good PT Frequency: Min 3X/week PT Duration: 6 weeks PT Treatment/Interventions: Therapeutic activities;Therapeutic exercise;Manual techniques;Patient/family education PT Plan: Pt to be progressed through stabilization, educated in Psychologist, clinical.     Goals Home Exercise Program Pt/caregiver will Perform Home Exercise Program: For increased strengthening;For increased ROM PT Goal: Perform Home  Exercise Program - Progress: Goal set today PT Short Term Goals Time to Complete Short Term Goals: 3 weeks PT Short Term Goal 1: Pt to be able to sit for 15 minutes without increased pain to be able to eat a quick meal. PT Short Term Goal 2: Pt to be able to walk for 15 minutes without having increased pain for healthier lifestyle PT Long Term Goals Time to Complete Long Term Goals: 4 weeks PT Long Term Goal 1: Pt to be completing an advance HEP PT Long Term Goal 2: Pt Pain to be no greater than a 4/10 80% of the day. Long Term Goal 3: Pt to be able to sit for an hour without increased pain to be able to enjoy going out to eat. Long Term Goal 4: Pt to be able to stand for 15 minutes without pain to be able to socialize PT Long Term Goal 5: Pt to be walking 30 minutes 4 times a week for imporved health habits.   Problem List  Patient Active Problem List   Diagnosis Date Noted  . Back pain 05/13/2013  . Sciatic pain 05/13/2013  . DDD (degenerative disc disease) 05/13/2013  . Abnormality of gait 04/08/2013  . 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 Foot drop, bilateral 04/08/2013  . Hip pain, right 03/23/2013  . Disequilibrium 03/23/2013  . Poor vision 11/28/2012  . OSA (obstructive sleep apnea) 02/02/2012  . Vertigo 11/01/2011  . Carotid bruit 11/01/2011  . Numbness and tingling in hands 11/01/2011  . Insomnia 07/28/2011  . Anxiety 07/23/2011  . Stress 07/23/2011  . Chest pain, unspecified 11/01/2010  . Chronic systolic heart failure   . AUTOMATIC IMPLANTABLE CARDIAC DEFIBRILLATOR SITU 03/10/2010  . HYPERTENSION, BENIGN 12/28/2009  . VENTRICULAR TACHYCARDIA 12/28/2009  . Coronary atherosclerosis of native coronary artery 09/20/2009  . FATIGUE 09/14/2009  . 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 Depression with anxiety 05/04/2008  . HYPERLIPIDEMIA 07/10/2007  . OBESITY 07/10/2007  . NICOTINE ADDICTION 07/10/2007    General Behavior  During Therapy: [facility] for tasks assessed/performed PT Plan of Care PT Home Exercise Plan: given  GP    Duncan Alejandro,CINDY 05/21/2013, 5:07 PM  Physician Documentation Your signature is required to indicate approval of the treatment plan as stated above.  Please sign and either send electronically or make a copy of this report for your files and return this physician signed original.   Please mark one 1.__approve of plan  2. ___approve of plan with the following conditions.   ______________________________                                                          _____________________ Physician Signature                                                                                                             Date

## 2013-05-26 ENCOUNTER — Ambulatory Visit (HOSPITAL_COMMUNITY): Payer: Self-pay | Admitting: Physical Therapy

## 2013-05-28 ENCOUNTER — Ambulatory Visit (HOSPITAL_COMMUNITY): Payer: Self-pay | Admitting: Physical Therapy

## 2013-05-29 ENCOUNTER — Ambulatory Visit (INDEPENDENT_AMBULATORY_CARE_PROVIDER_SITE_OTHER): Payer: BC Managed Care – PPO | Admitting: Family Medicine

## 2013-05-29 ENCOUNTER — Encounter (INDEPENDENT_AMBULATORY_CARE_PROVIDER_SITE_OTHER): Payer: Self-pay

## 2013-05-29 ENCOUNTER — Encounter: Payer: Self-pay | Admitting: Family Medicine

## 2013-05-29 VITALS — BP 122/70 | HR 72 | Resp 16 | Wt 282.1 lb

## 2013-05-29 DIAGNOSIS — I2510 Atherosclerotic heart disease of native coronary artery without angina pectoris: Secondary | ICD-10-CM

## 2013-05-29 DIAGNOSIS — Z794 Long term (current) use of insulin: Principal | ICD-10-CM

## 2013-05-29 DIAGNOSIS — I10 Essential (primary) hypertension: Secondary | ICD-10-CM

## 2013-05-29 DIAGNOSIS — IMO0002 Reserved for concepts with insufficient information to code with codable children: Secondary | ICD-10-CM

## 2013-05-29 DIAGNOSIS — J309 Allergic rhinitis, unspecified: Secondary | ICD-10-CM | POA: Insufficient documentation

## 2013-05-29 DIAGNOSIS — R42 Dizziness and giddiness: Secondary | ICD-10-CM

## 2013-05-29 DIAGNOSIS — R0989 Other specified symptoms and signs involving the circulatory and respiratory systems: Secondary | ICD-10-CM

## 2013-05-29 DIAGNOSIS — IMO0001 Reserved for inherently not codable concepts without codable children: Secondary | ICD-10-CM

## 2013-05-29 DIAGNOSIS — I5022 Chronic systolic (congestive) heart failure: Secondary | ICD-10-CM

## 2013-05-29 DIAGNOSIS — E1165 Type 2 diabetes mellitus with hyperglycemia: Principal | ICD-10-CM

## 2013-05-29 DIAGNOSIS — G4733 Obstructive sleep apnea (adult) (pediatric): Secondary | ICD-10-CM

## 2013-05-29 DIAGNOSIS — G4700 Insomnia, unspecified: Secondary | ICD-10-CM

## 2013-05-29 DIAGNOSIS — E1065 Type 1 diabetes mellitus with hyperglycemia: Secondary | ICD-10-CM

## 2013-05-29 DIAGNOSIS — E785 Hyperlipidemia, unspecified: Secondary | ICD-10-CM

## 2013-05-29 MED ORDER — FLUTICASONE PROPIONATE 50 MCG/ACT NA SUSP: 2.0000 | Freq: Every day | NASAL | Status: DC

## 2013-05-29 NOTE — Patient Instructions (Signed)
F/u in 4  Month, call if you need me before  New is flonase for allergies.  Fall Prevention and Home Safety Falls cause injuries and can affect all age groups. It is possible to prevent falls.  HOW TO PREVENT FALLS  Wear shoes with rubber soles that do not have an opening for your toes.  Keep the inside and outside of your house well lit.  Use night lights throughout your home.  Remove clutter from floors.  Clean up floor spills.  Remove throw rugs or fasten them to the floor with carpet tape.  Do not place electrical cords across pathways.  Put grab bars by your tub, shower, and toilet. Do not use towel bars as grab bars.  Put handrails on both sides of the stairway. Fix loose handrails.  Do not climb on stools or stepladders, if possible.  Do not wax your floors.  Repair uneven or unsafe sidewalks, walkways, or stairs.  Keep items you use a lot within reach.  Be aware of pets.  Keep emergency numbers next to the telephone.  Put smoke detectors in your home and near bedrooms. Ask your doctor what other things you can do to prevent falls. Document Released: 12/17/2008 Document Revised: 08/22/2011 Document Reviewed: 05/23/2011 [facility] Patient Information 2014 [place], [place].

## 2013-05-29 NOTE — Progress Notes (Signed)
Subjective:    Patient ID: Matthew [name], male    DOB: 1965-06-21, 48 y.o.   MRN: [id number]  HPI The PT is here for follow up and re-evaluation of chronic medical conditions, medication management and review of any available recent lab and radiology data.  He has established with psychiatry which has been beneficial, states sleep is improved, still has painic attacks , but  on new additional medication which helps ,and he denies suicidal or homicidal ideation. No hallucinations. Has upcoming appt with endo and states blood sugars are much improved, generally between 100 to 200, denies hypoglycemic episodes, polyuria or polydipsia. Still has significant visual impairment, now getting intraocular injections. Chronic joint pains which limit his mobility and ability to function persist, he is still awaiting a hearing for his disability, which he truly should get due to poor overall health.   Preventive health is updated, specifically  Cancer screening and Immunization.   Questions or concerns regarding consultations or procedures which the PT has had in the interim are  addressed. The PT denies any adverse reactions to current medications since the last visit.  There are no new concerns.  There are no specific complaints       Review of Systems See HPI Denies recent fever or chills. Denies sinus pressure,  ear pain or sore throat.c/o incxreased allergy symptoms with nasal congestion and clear watery drainage,which is expected at this time of the year.  Denies chest congestion, productive cough or wheezing. Chronic episodes of chest tightness, relieved by NTG, states also that he experiences frequent episodes of palpitations. Denies abdominal pain, nausea, vomiting,diarrhea or constipation.   Denies dysuria, frequency, hesitancy or incontinence. Chronic disabling joint pain, and limitation in mobility. C/o , numbness, and  Tingling.hands and lower extremities Chronic but improved   depression, anxiety or insomnia. Denies skin break down or rash.        Objective:   Physical Exam  BP 122/70  Pulse 72  Resp 16  Wt 282 lb 1.9 oz (127.969 kg)  SpO2 98% Patient alert and oriented and in no cardiopulmonary distress.  HEENT: No facial asymmetry, EOMI, ,  oropharynx pink and moist.  Neck supple no JVD  Chest: Clear to auscultation bilaterally.  CVS: S1, S2 no murmurs, no S3.  ABD: Soft non tender.   Ext: No edema  MS: Adequate though reduced  ROM spine, shoulders, hips and knees.  Skin: Intact, no ulcerations or rash noted.  Psych: Good eye contact, flat  affect. Memory intact not anxious but  depressed appearing.  CNS: CN 2-12 grossly intact, decreased vision, power normal throughout.       Assessment & Plan:  HYPERTENSION, BENIGN Controlled, no change in medication   Diabetes mellitus, insulin dependent (IDDM), uncontrolled Marked improvement, hopefully will continue to trend in this direction Patient advised to reduce carb and sweets, commit to regular physical activity, take meds as prescribed, test blood as directed, and attempt to lose weight, to improve blood sugar control. Managed by endo  Chronic systolic heart failure No decompensation currently  Insomnia Improved with new  Regime by psych, anxiety and panic as well as depression are the main underlying problems contributing to this  HYPERLIPIDEMIA Need to locate updated lab, uncontrolled based on available lab Hyperlipidemia:Low fat diet discussed and encouraged.  Will request with next lab from endo if he has no more recent lab  Disequilibrium Increased fall risk due to unsteady gait as well as poor vision, counseled re home safety  OSA (obstructive sleep apnea) Compliance with cPAP stressed  Carotid bruit updated carotid US still pending , need to f/u on this  Allergic rhinitis Increased symptoms with change in season, will send in rx for flonase, saline nasal flushes  also encouraged

## 2013-05-30 ENCOUNTER — Ambulatory Visit (HOSPITAL_COMMUNITY): Payer: Self-pay | Admitting: Physical Therapy

## 2013-06-01 NOTE — Assessment & Plan Note (Signed)
Increased symptoms with change in season, will send in rx for flonase, saline nasal flushes also encouraged

## 2013-06-01 NOTE — Assessment & Plan Note (Signed)
Need to locate updated lab, uncontrolled based on available lab Hyperlipidemia:Low fat diet discussed and encouraged.  Will request with next lab from endo if he has no more recent lab

## 2013-06-01 NOTE — Assessment & Plan Note (Signed)
Improved with new  Regime by psych, anxiety and panic as well as depression are the main underlying problems contributing to this

## 2013-06-01 NOTE — Assessment & Plan Note (Signed)
updated carotid US still pending , need to f/u on this

## 2013-06-01 NOTE — Assessment & Plan Note (Signed)
Marked improvement, hopefully will continue to trend in this direction Patient advised to reduce carb and sweets, commit to regular physical activity, take meds as prescribed, test blood as directed, and attempt to lose weight, to improve blood sugar control. Managed by endo

## 2013-06-01 NOTE — Assessment & Plan Note (Signed)
No decompensation currently 

## 2013-06-01 NOTE — Assessment & Plan Note (Signed)
Compliance with cPAP stressed

## 2013-06-01 NOTE — Assessment & Plan Note (Signed)
Controlled, no change in medication  

## 2013-06-01 NOTE — Assessment & Plan Note (Signed)
Increased fall risk due to unsteady gait as well as poor vision, counseled re home safety

## 2013-06-02 ENCOUNTER — Ambulatory Visit (HOSPITAL_COMMUNITY): Payer: Self-pay | Admitting: *Deleted

## 2013-06-02 ENCOUNTER — Other Ambulatory Visit: Payer: Self-pay | Admitting: Family Medicine

## 2013-06-02 DIAGNOSIS — I7090 Unspecified atherosclerosis: Secondary | ICD-10-CM

## 2013-06-02 DIAGNOSIS — R0989 Other specified symptoms and signs involving the circulatory and respiratory systems: Secondary | ICD-10-CM

## 2013-06-03 ENCOUNTER — Telehealth: Payer: Self-pay | Admitting: Family Medicine

## 2013-06-03 DIAGNOSIS — E785 Hyperlipidemia, unspecified: Secondary | ICD-10-CM

## 2013-06-03 NOTE — Telephone Encounter (Signed)
pls let pt kknow he needs fasting lipid panel , none since 09/2012, I also checked Dr Liliane Channel info, pls order, and send to the lab he is getting next lab draw,  Should be in April. Tell him ok to drink clear sweetened drink if needed to keep blood sugar up, will not affect lipid or hBa1C, Can do same day as Dr Liliane Channel lab

## 2013-06-04 ENCOUNTER — Ambulatory Visit (HOSPITAL_COMMUNITY): Payer: Self-pay | Admitting: Physical Therapy

## 2013-06-04 ENCOUNTER — Ambulatory Visit (HOSPITAL_COMMUNITY)
Admission: RE | Admit: 2013-06-04 | Discharge: 2013-06-04 | Disposition: A | Discharge status: Home / Self Care | Payer: BC Managed Care – PPO | Source: Ambulatory Visit | Attending: Family Medicine | Admitting: Family Medicine

## 2013-06-04 DIAGNOSIS — R0989 Other specified symptoms and signs involving the circulatory and respiratory systems: Secondary | ICD-10-CM

## 2013-06-04 DIAGNOSIS — E119 Type 2 diabetes mellitus without complications: Secondary | ICD-10-CM | POA: Insufficient documentation

## 2013-06-04 DIAGNOSIS — E785 Hyperlipidemia, unspecified: Secondary | ICD-10-CM | POA: Insufficient documentation

## 2013-06-04 DIAGNOSIS — I10 Essential (primary) hypertension: Secondary | ICD-10-CM | POA: Insufficient documentation

## 2013-06-04 NOTE — Telephone Encounter (Signed)
Called patient and left message for them to return call at the office   

## 2013-06-04 NOTE — Addendum Note (Signed)
Addended by: Eual Fines on: 06/04/2013 02:31 PM   Modules accepted: Orders

## 2013-06-04 NOTE — Telephone Encounter (Signed)
Will do in the next couple of days when he gets his labs done for Dr Dorris Fetch

## 2013-06-06 ENCOUNTER — Ambulatory Visit (HOSPITAL_COMMUNITY): Payer: Self-pay | Admitting: Physical Therapy

## 2013-06-09 ENCOUNTER — Ambulatory Visit (HOSPITAL_COMMUNITY): Payer: Self-pay | Admitting: *Deleted

## 2013-06-11 ENCOUNTER — Ambulatory Visit (HOSPITAL_COMMUNITY): Payer: Self-pay | Admitting: Physical Therapy

## 2013-06-13 ENCOUNTER — Ambulatory Visit (HOSPITAL_COMMUNITY): Payer: Self-pay | Admitting: Physical Therapy

## 2013-06-14 LAB — LIPID PANEL
CHOL/HDL RATIO: 6.5 ratio
Cholesterol: 169 mg/dL (ref 0–200)
HDL: 26 mg/dL — AB (ref >39)
LDL CALC: 81 mg/dL (ref 0–99)
Triglycerides: 311 mg/dL — ABNORMAL HIGH (ref <150)
VLDL: 62 mg/dL — AB (ref 0–40)

## 2013-06-16 ENCOUNTER — Encounter (HOSPITAL_COMMUNITY): Payer: Self-pay | Admitting: Psychology

## 2013-06-16 ENCOUNTER — Ambulatory Visit (INDEPENDENT_AMBULATORY_CARE_PROVIDER_SITE_OTHER): Payer: BC Managed Care – PPO | Admitting: Psychology

## 2013-06-16 ENCOUNTER — Ambulatory Visit (HOSPITAL_COMMUNITY): Payer: Self-pay | Admitting: *Deleted

## 2013-06-16 DIAGNOSIS — F329 Major depressive disorder, single episode, unspecified: Secondary | ICD-10-CM

## 2013-06-16 DIAGNOSIS — F4001 Agoraphobia with panic disorder: Secondary | ICD-10-CM

## 2013-06-16 DIAGNOSIS — F3289 Other specified depressive episodes: Secondary | ICD-10-CM

## 2013-06-16 DIAGNOSIS — F32A Depression, unspecified: Secondary | ICD-10-CM

## 2013-06-16 LAB — HEMOGLOBIN A1C: A1c: 7.8

## 2013-06-16 NOTE — Progress Notes (Signed)
    PROGRESS NOTE  Patient:  Matthew [name]   DOB: January 06, 1966  MR Number: [id number]  Location: [facility] [address] [place] [address] Dept: [phone]  Start: 11 AM End: 12 PM  Provider/Observer:     Edgardo Roys PSYD  Chief Complaint:      Chief Complaint  Patient presents with  . Panic Attack    Reason For Service:     The patient was referred because of issues related to anxiety and depression. Dr. Moshe Cipro referred him for psychotherapeutic interventions. The patient reports that he is dealing with sleeplessness, anxiety, and restlessness. The patient reports that the symptoms have been progressively worsening. The patient reports that he has developed severe neuropathy and also suffered a detached retina. The patient reports that both of his eyes have been causing a great deal of difficulty. 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 The patient reports that he started having anxiety attacks when he would recall experiences of his heart attack. The patient also describes history of multiple head traumas including the head trauma provide accident as well as potential anoxic events. The patient is currently on short-term disability and is just filled out his paperwork for Social Security disability. The patient lost his last because of severe memory issues and anxiety issues.    Interventions Strategy:  Cognitive/behavioral psychotherapeutic interventions  Participation Level:   Active  Participation Quality:  Appropriate      Behavioral Observation:  Well Groomed, Alert, and Appropriate.   Current Psychosocial Factors: Patient has been working with  doctors more about vision .   Content of Session:   Reviewed current symptoms and continued work on therapeutic interventions.   Current Status:   The patient reports that depression is better and having fewer panic attacks but still about 2 per week but not as long.  Using breathing exercises.  Patient Progress:    stable  Target Goals:    target goals include reducing the overall symptoms of anxiety and agitation as well as symptoms of depression as well as help with coping skills particular and his numerous severe medical issues.issues related to panic attacks  Last Reviewed:    06/16/2013  Goals Addressed Today:     Worked on issues related to better coping skills and strategies.  Impression/Diagnosis:   the patient has a number of significant medical issues that he is coping with it has been having anxiety and depression prior and has been exacerbated by his current difficulties. He has developed some panic attacks and anxiety as well. We'll set the patient for individual psychotherapeutic interventions.   Diagnosis:    Axis I: Panic disorder with agoraphobia  Depressive disorder   Umer Harig R, PsyD 06/16/2013

## 2013-06-17 ENCOUNTER — Telehealth: Payer: Self-pay | Admitting: *Deleted

## 2013-06-17 NOTE — Telephone Encounter (Signed)
Patient request metoprolol refill, but I don't see where we have ever refilled it. He stated that Dr Lovena Le prescribed it for him, but it looks like his pcp has been refilling it all along. Please advise. Thanks, MI

## 2013-06-17 NOTE — Telephone Encounter (Signed)
Please have his PCP fill  Looks like he has a problem with compliance

## 2013-06-18 ENCOUNTER — Ambulatory Visit (HOSPITAL_COMMUNITY): Payer: Self-pay | Admitting: Physical Therapy

## 2013-06-18 ENCOUNTER — Other Ambulatory Visit: Payer: Self-pay

## 2013-06-18 MED ORDER — METOPROLOL TARTRATE 25 MG PO TABS: 25.0000 mg | ORAL_TABLET | Freq: Two times a day (BID) | ORAL | Status: DC

## 2013-06-18 NOTE — Telephone Encounter (Signed)
Patient aware and he will call pcp.

## 2013-06-23 ENCOUNTER — Other Ambulatory Visit: Payer: Self-pay

## 2013-06-23 MED ORDER — ROSUVASTATIN CALCIUM 20 MG PO TABS: 20.0000 mg | ORAL_TABLET | Freq: Every day | ORAL | Status: DC

## 2013-06-23 MED ORDER — EZETIMIBE 10 MG PO TABS: 10.0000 mg | ORAL_TABLET | Freq: Every day | ORAL | Status: DC

## 2013-07-07 ENCOUNTER — Encounter (HOSPITAL_COMMUNITY): Payer: Self-pay | Admitting: Psychology

## 2013-07-07 ENCOUNTER — Ambulatory Visit (INDEPENDENT_AMBULATORY_CARE_PROVIDER_SITE_OTHER): Payer: BC Managed Care – PPO | Admitting: Psychology

## 2013-07-07 DIAGNOSIS — F329 Major depressive disorder, single episode, unspecified: Secondary | ICD-10-CM

## 2013-07-07 DIAGNOSIS — F3289 Other specified depressive episodes: Secondary | ICD-10-CM

## 2013-07-07 DIAGNOSIS — F32A Depression, unspecified: Secondary | ICD-10-CM

## 2013-07-07 DIAGNOSIS — F4001 Agoraphobia with panic disorder: Secondary | ICD-10-CM

## 2013-07-07 NOTE — Progress Notes (Signed)
PROGRESS NOTE  Patient:  Matthew [name]   DOB: [birth year]  MR Number: [id number]  Location: [facility] [address] [place] [address] Dept: [phone]  Start: 11 AM End: 12 PM  Provider/Observer:     Edgardo Roys PSYD  Chief Complaint:      Chief Complaint  Patient presents with  . Panic Attack  . Anxiety  . Stress    Reason For Service:     The patient was referred because of issues related to anxiety and depression. Dr. Moshe Cipro referred him for psychotherapeutic interventions. The patient reports that he is dealing with sleeplessness, anxiety, and restlessness. The patient reports that the symptoms have been progressively worsening. The patient reports that he has developed severe neuropathy and also suffered a detached retina. The patient reports that both of his eyes have been causing a great deal of difficulty. 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 The patient reports that he started having anxiety attacks when he would recall experiences of his heart attack. The patient also describes history of multiple head traumas including the head trauma provide accident as well as potential anoxic events. The patient is currently on short-term disability and is just filled out his paperwork for Social Security disability. The patient lost his last because of severe memory issues and anxiety issues.    Interventions Strategy:  Cognitive/behavioral psychotherapeutic interventions  Participation Level:   Active  Participation Quality:  Appropriate      Behavioral Observation:  Well Groomed, Alert, and Appropriate.   Current Psychosocial Factors: Patient  has been having to start of surgeries on his right eye. There is considerable blood intrusion when he has these surgeries and he is nearly lost his sight in his right eye with hope that it will help regain some vision in that eye. The patient reports that he has continued to struggle financially and had trouble selling his vehicle as it needed some work in his head that patient have some things done to it that he was hoping he would simply be able to spell the vehicle. The patient reports that he has been forced to live with his parents and he has no money to be able to manage other possibilities.   Content of Session:   Reviewed current symptoms and continued work on therapeutic interventions.   Current Status:   The patient reports that depression is better and having fewer panic attacks but still about 2 per week but not as long.  The patient . continues to report one or 2 panic attacks each week but the duration continues to get shorter he is actively utilizing breathing techniques and relaxation techniques to shorten the duration.  Patient Progress:    stable  Target Goals:    target goals include reducing the overall symptoms of anxiety and agitation as well as symptoms of depression as well as help with coping skills particular and his numerous severe medical issues.issues related to panic attacks  Last Reviewed:    07/07/2013  Goals Addressed Today:     Worked on issues related to better coping skills and strategies.  Impression/Diagnosis:   the patient has a number of  significant medical issues that he is coping with it has been having anxiety and depression prior and has been exacerbated by his current difficulties. He has developed some panic attacks and anxiety as well. We'll set the patient for individual psychotherapeutic interventions.   Diagnosis:    Axis I: Panic disorder with agoraphobia  Depressive disorder   RODENBOUGH,JOHN R, PsyD

## 2013-07-11 ENCOUNTER — Ambulatory Visit (HOSPITAL_COMMUNITY): Payer: Self-pay | Admitting: Psychiatry

## 2013-07-14 ENCOUNTER — Encounter (HOSPITAL_COMMUNITY): Payer: Self-pay | Admitting: Psychiatry

## 2013-07-14 ENCOUNTER — Ambulatory Visit (INDEPENDENT_AMBULATORY_CARE_PROVIDER_SITE_OTHER): Payer: BC Managed Care – PPO | Admitting: Psychiatry

## 2013-07-14 VITALS — BP 120/74 | Ht 69.0 in | Wt 277.0 lb

## 2013-07-14 DIAGNOSIS — F4001 Agoraphobia with panic disorder: Secondary | ICD-10-CM

## 2013-07-14 DIAGNOSIS — F411 Generalized anxiety disorder: Secondary | ICD-10-CM

## 2013-07-14 DIAGNOSIS — F418 Other specified anxiety disorders: Secondary | ICD-10-CM

## 2013-07-14 DIAGNOSIS — F3289 Other specified depressive episodes: Secondary | ICD-10-CM

## 2013-07-14 DIAGNOSIS — F329 Major depressive disorder, single episode, unspecified: Secondary | ICD-10-CM

## 2013-07-14 DIAGNOSIS — F32A Depression, unspecified: Secondary | ICD-10-CM

## 2013-07-14 MED ORDER — FLUOXETINE HCL 40 MG PO CAPS: 40.0000 mg | ORAL_CAPSULE | Freq: Every day | ORAL | Status: DC

## 2013-07-14 MED ORDER — MIRTAZAPINE 30 MG PO TABS: 30.0000 mg | ORAL_TABLET | Freq: Every day | ORAL | Status: DC

## 2013-07-14 MED ORDER — CLONAZEPAM 0.5 MG PO TABS: 0.5000 mg | ORAL_TABLET | Freq: Every day | ORAL | Status: DC | PRN
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
## 2013-07-23 ENCOUNTER — Ambulatory Visit (INDEPENDENT_AMBULATORY_CARE_PROVIDER_SITE_OTHER): Payer: BC Managed Care – PPO | Admitting: *Deleted

## 2013-07-23 ENCOUNTER — Encounter: Payer: Self-pay | Admitting: Internal Medicine

## 2013-07-23 DIAGNOSIS — Z95810 Presence of automatic (implantable) cardiac defibrillator: Secondary | ICD-10-CM

## 2013-07-23 DIAGNOSIS — I472 Ventricular tachycardia: Secondary | ICD-10-CM

## 2013-07-23 DIAGNOSIS — I4729 Other ventricular tachycardia: Secondary | ICD-10-CM

## 2013-07-23 LAB — MDC_IDC_ENUM_SESS_TYPE_INCLINIC
Brady Statistic RV Percent Paced: 1 % — CL
Implantable Pulse Generator Serial Number: 168307
Lead Channel Impedance Value: 453 Ohm
Lead Channel Impedance Value: 467 Ohm
Lead Channel Pacing Threshold Amplitude: 0.4 V
Lead Channel Pacing Threshold Pulse Width: 0.4 ms
Lead Channel Pacing Threshold Pulse Width: 0.4 ms
Lead Channel Sensing Intrinsic Amplitude: 2.4 mV
Lead Channel Setting Pacing Amplitude: 2 V
Lead Channel Setting Pacing Pulse Width: 0.4 ms
Lead Channel Setting Sensing Sensitivity: 0.5 mV
MDC IDC MSMT LEADCHNL RV PACING THRESHOLD AMPLITUDE: 0.9 V
MDC IDC MSMT LEADCHNL RV SENSING INTR AMPL: 14 mV
MDC IDC SET LEADCHNL RV PACING AMPLITUDE: 2.4 V
MDC IDC SET ZONE DETECTION INTERVAL: 333 ms
MDC IDC STAT BRADY RA PERCENT PACED: 1 % — AB
Zone Setting Detection Interval: 286 ms

## 2013-07-23 NOTE — Progress Notes (Signed)
ICD check in clinic. Normal device function. Thresholds and sensing consistent with previous device measurements. Impedance trends stable over time. 1 NSVT---was SVT--9 beats. No mode switches. Histogram distribution appropriate for patient and level of activity. Device programmed at appropriate safety margins---changed VF detection time from 1.0 to 2.5s, VT detection from 2.5 to 5.0s. Device programmed to optimize intrinsic conduction. Estimated longevity 8.15yrs. ROV w/ Dr. Lovena Le 10/22/13.

## 2013-07-24 NOTE — Addendum Note (Signed)
Encounter addended by: Leeroy Cha, PT on: 07/24/2013  1:31 PM<BR>     Documentation filed: Notes Section, Episodes

## 2013-07-24 NOTE — Progress Notes (Signed)
  Patient Details  Name: Matthew [name] MRN: [id number] Date of Birth: March 18, 1965  Today's Date: 07/24/2013 Discharge patient as he never returned after the initial visit.  Leeroy Cha 07/24/2013, 1:29 PM

## 2013-07-24 NOTE — Addendum Note (Signed)
Encounter addended by: Leeroy Cha, PT on: 07/24/2013  4:37 PM<BR>     Documentation filed: Episodes

## 2013-07-29 ENCOUNTER — Ambulatory Visit (INDEPENDENT_AMBULATORY_CARE_PROVIDER_SITE_OTHER): Payer: BC Managed Care – PPO | Admitting: Psychology

## 2013-07-29 ENCOUNTER — Encounter (HOSPITAL_COMMUNITY): Payer: Self-pay | Admitting: Psychology

## 2013-07-29 DIAGNOSIS — F329 Major depressive disorder, single episode, unspecified: Secondary | ICD-10-CM

## 2013-07-29 DIAGNOSIS — F3289 Other specified depressive episodes: Secondary | ICD-10-CM

## 2013-07-29 DIAGNOSIS — F4001 Agoraphobia with panic disorder: Secondary | ICD-10-CM

## 2013-07-29 DIAGNOSIS — F32A Depression, unspecified: Secondary | ICD-10-CM
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
## 2013-07-31 ENCOUNTER — Encounter (HOSPITAL_COMMUNITY): Payer: Self-pay | Admitting: Psychiatry

## 2013-07-31 ENCOUNTER — Ambulatory Visit (INDEPENDENT_AMBULATORY_CARE_PROVIDER_SITE_OTHER): Payer: BC Managed Care – PPO | Admitting: Psychiatry

## 2013-07-31 VITALS — BP 140/82 | Ht 69.0 in | Wt 268.0 lb

## 2013-07-31 DIAGNOSIS — F411 Generalized anxiety disorder: Secondary | ICD-10-CM

## 2013-07-31 DIAGNOSIS — F4001 Agoraphobia with panic disorder: Secondary | ICD-10-CM

## 2013-07-31 DIAGNOSIS — F32A Depression, unspecified: Secondary | ICD-10-CM

## 2013-07-31 DIAGNOSIS — F3289 Other specified depressive episodes: Secondary | ICD-10-CM

## 2013-07-31 DIAGNOSIS — F329 Major depressive disorder, single episode, unspecified: Secondary | ICD-10-CM

## 2013-07-31 MED ORDER — TRAZODONE HCL 50 MG PO TABS: 50.0000 mg | ORAL_TABLET | Freq: Every day | ORAL | Status: DC

## 2013-07-31 MED ORDER — FLUOXETINE HCL 20 MG PO CAPS: ORAL_CAPSULE | ORAL | Status: DC

## 2013-07-31 MED ORDER — CLONAZEPAM 0.5 MG PO TABS: 0.5000 mg | ORAL_TABLET | Freq: Two times a day (BID) | ORAL | Status: DC
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
## 2013-08-08 ENCOUNTER — Telehealth: Payer: Self-pay | Admitting: Family Medicine

## 2013-08-21 NOTE — Telephone Encounter (Signed)
Patient picked up letter this morning.

## 2013-08-28 ENCOUNTER — Encounter (HOSPITAL_COMMUNITY): Payer: Self-pay | Admitting: Psychiatry

## 2013-08-28 ENCOUNTER — Ambulatory Visit (INDEPENDENT_AMBULATORY_CARE_PROVIDER_SITE_OTHER): Payer: BC Managed Care – PPO | Admitting: Psychiatry

## 2013-08-28 VITALS — BP 110/70 | Ht 69.0 in | Wt 267.0 lb

## 2013-08-28 DIAGNOSIS — F4001 Agoraphobia with panic disorder: Secondary | ICD-10-CM

## 2013-08-28 DIAGNOSIS — F3289 Other specified depressive episodes: Secondary | ICD-10-CM

## 2013-08-28 DIAGNOSIS — F411 Generalized anxiety disorder: Secondary | ICD-10-CM

## 2013-08-28 DIAGNOSIS — F329 Major depressive disorder, single episode, unspecified: Secondary | ICD-10-CM

## 2013-08-28 DIAGNOSIS — F32A Depression, unspecified: Secondary | ICD-10-CM
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
## 2013-08-29 ENCOUNTER — Ambulatory Visit (HOSPITAL_COMMUNITY): Payer: Self-pay | Admitting: Psychology

## 2013-09-18 ENCOUNTER — Telehealth: Payer: Self-pay | Admitting: Family Medicine

## 2013-09-18 DIAGNOSIS — E1165 Type 2 diabetes mellitus with hyperglycemia: Principal | ICD-10-CM

## 2013-09-18 DIAGNOSIS — IMO0001 Reserved for inherently not codable concepts without codable children: Secondary | ICD-10-CM

## 2013-09-18 DIAGNOSIS — Z794 Long term (current) use of insulin: Principal | ICD-10-CM

## 2013-09-18 NOTE — Telephone Encounter (Signed)
Patient aware and will go next week to have labwork before visit

## 2013-09-18 NOTE — Telephone Encounter (Signed)
Called patient and left message for them to return call at the office   

## 2013-09-24 ENCOUNTER — Ambulatory Visit (HOSPITAL_COMMUNITY): Payer: Self-pay | Admitting: Psychology

## 2013-09-26 ENCOUNTER — Telehealth (HOSPITAL_COMMUNITY): Payer: Self-pay | Admitting: *Deleted

## 2013-09-26 LAB — HEMOGLOBIN A1C
Hgb A1c MFr Bld: 8 % — ABNORMAL HIGH (ref <5.7)
MEAN PLASMA GLUCOSE: 183 mg/dL — AB (ref <117)

## 2013-09-26 NOTE — Telephone Encounter (Signed)
done

## 2013-09-30 ENCOUNTER — Encounter: Payer: Self-pay | Admitting: Neurology

## 2013-09-30 ENCOUNTER — Ambulatory Visit (HOSPITAL_COMMUNITY)
Admission: RE | Admit: 2013-09-30 | Discharge: 2013-09-30 | Disposition: A | Discharge status: Home / Self Care | Payer: BC Managed Care – PPO | Source: Ambulatory Visit | Attending: Family Medicine | Admitting: Family Medicine

## 2013-09-30 ENCOUNTER — Ambulatory Visit (INDEPENDENT_AMBULATORY_CARE_PROVIDER_SITE_OTHER): Payer: BC Managed Care – PPO | Admitting: Family Medicine

## 2013-09-30 ENCOUNTER — Encounter: Payer: Self-pay | Admitting: Family Medicine

## 2013-09-30 ENCOUNTER — Ambulatory Visit (INDEPENDENT_AMBULATORY_CARE_PROVIDER_SITE_OTHER): Payer: BC Managed Care – PPO | Admitting: Neurology

## 2013-09-30 VITALS — BP 173/85 | HR 71 | Ht 69.0 in | Wt 266.0 lb

## 2013-09-30 VITALS — BP 150/90 | HR 82 | Resp 16 | Ht 69.0 in | Wt 263.0 lb

## 2013-09-30 DIAGNOSIS — G5602 Carpal tunnel syndrome, left upper limb: Secondary | ICD-10-CM | POA: Insufficient documentation

## 2013-09-30 DIAGNOSIS — E1165 Type 2 diabetes mellitus with hyperglycemia: Secondary | ICD-10-CM

## 2013-09-30 DIAGNOSIS — E1065 Type 1 diabetes mellitus with hyperglycemia: Secondary | ICD-10-CM

## 2013-09-30 DIAGNOSIS — N528 Other male erectile dysfunction: Secondary | ICD-10-CM

## 2013-09-30 DIAGNOSIS — M25529 Pain in unspecified elbow: Secondary | ICD-10-CM

## 2013-09-30 DIAGNOSIS — I209 Angina pectoris, unspecified: Secondary | ICD-10-CM

## 2013-09-30 DIAGNOSIS — M25521 Pain in right elbow: Secondary | ICD-10-CM

## 2013-09-30 DIAGNOSIS — M25519 Pain in unspecified shoulder: Secondary | ICD-10-CM

## 2013-09-30 DIAGNOSIS — I25119 Atherosclerotic heart disease of native coronary artery with unspecified angina pectoris: Secondary | ICD-10-CM

## 2013-09-30 DIAGNOSIS — IMO0001 Reserved for inherently not codable concepts without codable children: Secondary | ICD-10-CM

## 2013-09-30 DIAGNOSIS — M25512 Pain in left shoulder: Secondary | ICD-10-CM

## 2013-09-30 DIAGNOSIS — E785 Hyperlipidemia, unspecified: Secondary | ICD-10-CM

## 2013-09-30 DIAGNOSIS — Z794 Long term (current) use of insulin: Secondary | ICD-10-CM

## 2013-09-30 DIAGNOSIS — I2510 Atherosclerotic heart disease of native coronary artery without angina pectoris: Secondary | ICD-10-CM

## 2013-09-30 DIAGNOSIS — E1142 Type 2 diabetes mellitus with diabetic polyneuropathy: Secondary | ICD-10-CM

## 2013-09-30 DIAGNOSIS — N529 Male erectile dysfunction, unspecified: Secondary | ICD-10-CM

## 2013-09-30 DIAGNOSIS — E669 Obesity, unspecified: Secondary | ICD-10-CM

## 2013-09-30 DIAGNOSIS — I10 Essential (primary) hypertension: Secondary | ICD-10-CM

## 2013-09-30 DIAGNOSIS — R269 Unspecified abnormalities of gait and mobility: Secondary | ICD-10-CM

## 2013-09-30 DIAGNOSIS — M216X9 Other acquired deformities of unspecified foot: Secondary | ICD-10-CM

## 2013-09-30 DIAGNOSIS — IMO0002 Reserved for concepts with insufficient information to code with codable children: Secondary | ICD-10-CM

## 2013-09-30 DIAGNOSIS — R3911 Hesitancy of micturition: Secondary | ICD-10-CM

## 2013-09-30 DIAGNOSIS — G5600 Carpal tunnel syndrome, unspecified upper limb: Secondary | ICD-10-CM

## 2013-09-30 DIAGNOSIS — M25522 Pain in left elbow: Secondary | ICD-10-CM

## 2013-09-30 DIAGNOSIS — M21372 Foot drop, left foot: Secondary | ICD-10-CM

## 2013-09-30 DIAGNOSIS — M21371 Foot drop, right foot: Secondary | ICD-10-CM

## 2013-09-30 DIAGNOSIS — M25511 Pain in right shoulder: Secondary | ICD-10-CM

## 2013-09-30 MED ORDER — LOSARTAN POTASSIUM 50 MG PO TABS: 50.0000 mg | ORAL_TABLET | Freq: Every day | ORAL | Status: DC

## 2013-09-30 MED ORDER — BACLOFEN 10 MG PO TABS: 10.0000 mg | ORAL_TABLET | Freq: Every day | ORAL | Status: DC

## 2013-09-30 NOTE — Assessment & Plan Note (Signed)
Difficulty with attaining and maintaining erections which is  Increasing, reduced ejaculate, and poor urinary stream , needs urology eval

## 2013-09-30 NOTE — Progress Notes (Signed)
   Subjective:    Patient ID: Matthew [name], male    DOB: 10/14/1965, 48 y.o.   MRN: [id number]  HPI C/o recurrent falls on avg 2 to 3 per week New is joint pains rated at  8 primarily in both elbows and both shoulders, has past h/o left shoulder dislocation C/o increased weakness, inabilty to grip in left hand , needs CTR, has ahd right carpal  Tunnel and right elbow surgery in the past Ongoing intermittent chest pain and poor exercise tolerance` Blood sugar is improving and he is more careful with diet C/o poor urinary stream and erectile function which is worsening, requests urology eval Anxiety  And depression are improved slightly thougfh still a real struggle espescialy with his personal health challenges , and now his mother is also ill, and he is now living with both parents. He remains financially challenged also     Review of Systems See HPI Denies recent fever or chills. Denies sinus pressure, nasal congestion, ear pain or sore throat. Denies chest congestion, productive cough or wheezing. Denies abdominal pain, nausea, vomiting,diarrhea or constipation.   Denies headaches, seizures, numbness, or tingling. Denies skin break down or rash.        Objective:   Physical Exam  BP 150/90  Pulse 82  Resp 16  Ht 5\' 9"  (1.753 m)  Wt 263 lb (119.296 kg)  BMI 38.82 kg/m2  SpO2 96% Patient alert and oriented and in no cardiopulmonary distress.  HEENT: No facial asymmetry, EOMI,   oropharynx pink and moist.  Neck supple no JVD, no mass.  Chest: Clear to auscultation bilaterally.  CVS: S1, S2 no murmurs, no S3.Regular rate.Extremities, no edema  ABD: Soft non localized tenderness or  guarding .  MS: Adequate though reduced  ROM spine, shoulders, hips and knees.  Skin: Intact, no ulcerations or rash noted.  Psych: Good eye contact, normal affect. Memory intact both  anxious and  depressed appearing.  CNS: CN 2-12 intact, power,  normal throughout.no focal deficits  noted.       Assessment & Plan:  Left carpal tunnel syndrome Increased weakness and debility, thenar muscle wasting  Male erectile dysfunction Difficulty with attaining and maintaining erections which is  Increasing, reduced ejaculate, and poor urinary stream , needs urology eval  Shoulder pain, bilateral C/o increased pain xrays ordered, no recent trauma  Bilateral elbow joint pain Xray of both elbows and eval by ortho  Abnormality of gait Recurrent falls, currently being followed by neurology, Fall precautions are reviewedd  Diabetes mellitus, insulin dependent (IDDM), uncontrolled Improved, pt applauded on this, Mabnaged by endocrinology Patient advised to reduce carb and sweets, commit to regular physical activity, take meds as prescribed, test blood as directed, and attempt to lose weight, to improve blood sugar control.   HYPERTENSION, BENIGN Uncontrolled, pt to resume ARB Low sodium diet and regular physical activity as tolerated, discussed and encouraged  OBESITY Improved. Pt applauded on succesful weight loss through lifestyle change, and encouraged to continue same. Weight loss goal set for the next several months.   HYPERLIPIDEMIA Uncontrolled. Updated lab needed at/ before next visit. Hyperlipidemia:Low fat diet discussed and encouraged.    Coronary atherosclerosis of native coronary artery Chronic chest pain no acute decompensation since past 3 month

## 2013-09-30 NOTE — Progress Notes (Signed)
Reason for visit: Peripheral neuropathy  Matthew [name] is an 48 y.o. male  History of present illness:  Matthew [name] is a 48 year old left-handed white male with a history of diabetes and a diabetic peripheral neuropathy. The patient has bilateral foot drops and a gait disorder associated with this. He has a walking stick to get around. He does report ongoing relatively frequent falls. He has had multiple episodes of dizziness. He has a defibrillator in place, and he indicates that the heart rhythm is quite often unstable. He will sometimes feel palpitations of the heart around the time of episodes of dizziness. The episodes occur only with standing, not with sitting or lying down. The patient has never lost consciousness, but he has had dimming of vision during some of the episodes. The patient does note some occasional urinary incontinence, he denies problems controlling the bowels. He has frequent nocturnal leg cramps that wake him up on average twice a night, with cramps mainly in the feet and toes. He does not sleep well at night. He returns to this office for an evaluation. The patient claims that he did have a carotid Doppler study and was told this was stable, the report is not available to me.  Past Medical History  Diagnosis Date  . Cardiac arrest - ventricular fibrillation 08/26/2009  . CAD (coronary artery disease)     a. OOH MI 6/11; presented with acute CHF; hosp course c/b VF arrest with VDRF, etc;   b s/p CABG: L-LAD/Dx, S-OM/dCFX;  c.Nuclear scan 8/12:  Inferior and inf-lat scar with minimal peri-infarct ischemia, EF 53%.; d.LHC 9/12:  LAD 90%, pD1 (small) 70%, prox large Dx 95%, L-LAD and Dx ok, CFX occluded, S-OM and CFX ok, RCA prox to prox/mid 60-70%.  Medical management     . Ischemic cardiomyopathy     echo 10/11:  inf-septal and apical HK, EF 35%, mod LAE  . Diabetic coma with ketoacidosis     Occured in 1994 where he spent 30 days in a coma . He develpoed infection of his  leg and developing pancreatitis during that  time   . DM2 (diabetes mellitus, type 2)   . Cellulitis 2012    spider bite  . HTN (hypertension)   . HLD (hyperlipidemia)   . Chronic systolic heart failure   . Thyroid disease   . Coma   . Headache(784.0)     sinus  . Anxiety     stress  . CKD (chronic kidney disease)   . Neuropathy   . Carpal tunnel syndrome of right wrist   . 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 Abnormality of gait 04/08/2013  . 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 Foot drop, bilateral 04/08/2013  . Obesity   . Anxiety and depression   . Retinal detachment     Bilateral, laser surgery on the right  . Nocturnal leg cramps     Past Surgical History  Procedure Laterality Date  . Coronary artery bypass grafting x4  june 29,2011    x 4  . Tonsillectomy  1998  . Appendectomy  mid 1990  . Right sided abdominal cyst removal    . Carpal tunnel with cubital tunnel  02/22/2012    Procedure: CARPAL TUNNEL WITH CUBITAL TUNNEL;  Surgeon: Roseanne Kaufman, MD;  Location: [facility];  Service: Orthopedics;  Laterality: Right;  Right Carpal Tunnel Release/Right Cubital Tunnel Release and Transposition if Necessary with Flexor Pronator Release  . Cardiac surgery    .  Pacemaker insertion    . Ulnar nerve transposition Right     Family History  Problem Relation Age of Onset  . Cancer Maternal Grandmother   . Allergies Mother   . Diabetes Mother   . Hypertension Mother   . Hyperlipidemia Mother   . Dementia Mother   . Stroke Father   . Allergies Father   . Dementia Father   . Alcohol abuse Father   . Diabetes Sister   . Heart disease Sister   . Anxiety disorder Sister   . Diabetes Sister   . Allergies Sister   . Schizophrenia Paternal Uncle   . Schizophrenia Cousin     Social history:  reports that he has never smoked. His smokeless tobacco use includes Snuff. He reports that he drinks alcohol. He reports that he does not use illicit drugs.    Allergies   Allergen Reactions  . Ace Inhibitors Cough    Medications:  Current Outpatient Prescriptions on File Prior to Visit  Medication Sig Dispense Refill  . aspirin 325 MG tablet Take 325 mg by mouth daily.        . Chromium Picolinate 200 MCG CAPS Take 200 mcg by mouth daily.       . Cinnamon 500 MG TABS Take 1,000 mg by mouth daily.       . clonazePAM (KLONOPIN) 0.5 MG tablet Take 1 tablet (0.5 mg total) by mouth 2 (two) times daily.  60 tablet  2  . ezetimibe (ZETIA) 10 MG tablet Take 1 tablet (10 mg total) by mouth daily.  90 tablet  3  . fluticasone (FLONASE) 50 MCG/ACT nasal spray Place 2 sprays into both nostrils daily.  16 g  6  . furosemide (LASIX) 40 MG tablet Take 1 tablet (40 mg total) by mouth daily.  90 tablet  3  . gabapentin (NEURONTIN) 300 MG capsule Take 1 capsule (300 mg total) by mouth at bedtime.  90 capsule  5  . Insulin Glargine (LANTUS SOLOSTAR) 100 UNIT/ML SOPN 70 units at bedtime  21 pen  3  . insulin lispro (HUMALOG) 100 UNIT/ML injection Inject 12 Units into the skin 3 (three) times daily before meals. Sliding scale      . methylcellulose (ARTIFICIAL TEARS) 1 % ophthalmic solution Place 1 drop into both eyes 2 (two) times daily as needed (dry eyes).      . metoprolol tartrate (LOPRESSOR) 25 MG tablet Take 1 tablet (25 mg total) by mouth 2 (two) times daily.  180 tablet  0  . Multiple Vitamin (MULTIVITAMIN) tablet Take 1 tablet by mouth daily.        . Omega-3 Fatty Acids (FISH OIL BURP-LESS PO) Take 1 tablet by mouth daily.        . rosuvastatin (CRESTOR) 20 MG tablet Take 1 tablet (20 mg total) by mouth daily.  90 tablet  3  . nitroGLYCERIN (NITROSTAT) 0.4 MG SL tablet Place 0.4 mg under the tongue every 5 (five) minutes as needed for chest pain.       No current facility-administered medications on file prior to visit.    ROS:  Out of a complete 14 system review of symptoms, the patient complains only of the following symptoms, and all other reviewed systems are  negative.  Gait disorder Numbness in the feet Episodic dizziness  Blood pressure 173/85, pulse 71, height 5\' 9"  (1.753 m), weight 266 lb (120.657 kg).  Blood pressure, right arm, standing is 152/96.  Physical Exam  General: The  patient is alert and cooperative at the time of the examination. The patient is moderately obese.  Skin: No significant peripheral edema is noted.   Neurologic Exam  Mental status: The patient is oriented x 3.  Cranial nerves: Facial symmetry is present. Speech is normal, no aphasia or dysarthria is noted. Extraocular movements are full. Visual fields are full.  Motor: The patient has good strength in all 4 extremities, with exception of mild bilateral foot drops. The patient has weakness of the intrinsic muscles of the hands bilaterally with abduction of the fingers. Atrophy of the first dorsal interossei are seen bilaterally.  Sensory examination: There is a stocking pattern pinprick sensory deficit up to the knees bilaterally. Soft touch sensation on the arms and face are symmetric.  Coordination: The patient has good finger-nose-finger and heel-to-shin bilaterally.  Gait and station: The patient has a slightly wide-based gait. The patient uses a walking stick for ambulation. Tandem gait is unsteady. Romberg is negative. No drift is seen.  Reflexes: Deep tendon reflexes are symmetric, but are depressed.   Assessment/Plan:  One. Peripheral neuropathy  2. Diabetes  3. Gait disorder  4. Nocturnal leg cramps  5. Episodic dizziness  6. Bilateral foot drops  The patient has bilateral foot drops, and a gait disorder. He may benefit from AFO braces to help with his gait stability. The patient will be referred for physical therapy for this reason. The patient has significant problems with nocturnal leg cramps, and low-dose baclofen will be added at night. He will followup through this office in about 6 months. He will contact our office if he requires  a dose adjustment. The patient indicates that his episodes of dizziness may correlate with some palpitations of the heart. It is possible that some of his episodes may be related to heart rhythm abnormalities. The patient is applying for Social Security disability. He has an attorney, [employer], and he requires a disability form to be filled out today.  Jill Alexanders MD 09/30/2013 7:22 PM  [facility] [address] [place], [place] [address]  Phone [phone] Fax [phone]

## 2013-09-30 NOTE — Patient Instructions (Signed)

## 2013-09-30 NOTE — Patient Instructions (Addendum)
F/u in early Sept call if you need mwe before  Your BP is high, resume losartan, this is  Sent in  Xray of shoulders and elbows today and you are referred to Dr Karsten Fells are referred to urology as discussed

## 2013-09-30 NOTE — Assessment & Plan Note (Signed)
Increased weakness and debility, thenar muscle wasting

## 2013-10-01 NOTE — Progress Notes (Signed)
Patient aware.

## 2013-10-02 ENCOUNTER — Telehealth: Payer: Self-pay | Admitting: Neurology

## 2013-10-02 NOTE — Telephone Encounter (Signed)
Patient returning missed call from our office, please return call to patient and advise.

## 2013-10-06 DIAGNOSIS — Z0289 Encounter for other administrative examinations: Secondary | ICD-10-CM

## 2013-10-10 ENCOUNTER — Ambulatory Visit: Payer: BC Managed Care – PPO | Attending: Neurology | Admitting: Physical Therapy

## 2013-10-10 DIAGNOSIS — IMO0001 Reserved for inherently not codable concepts without codable children: Secondary | ICD-10-CM | POA: Insufficient documentation

## 2013-10-10 DIAGNOSIS — M216X9 Other acquired deformities of unspecified foot: Secondary | ICD-10-CM | POA: Insufficient documentation

## 2013-10-10 DIAGNOSIS — E1142 Type 2 diabetes mellitus with diabetic polyneuropathy: Secondary | ICD-10-CM | POA: Insufficient documentation

## 2013-10-10 DIAGNOSIS — R269 Unspecified abnormalities of gait and mobility: Secondary | ICD-10-CM | POA: Insufficient documentation

## 2013-10-10 DIAGNOSIS — R279 Unspecified lack of coordination: Secondary | ICD-10-CM | POA: Insufficient documentation

## 2013-10-14 ENCOUNTER — Ambulatory Visit (HOSPITAL_COMMUNITY): Payer: Self-pay | Admitting: Psychiatry

## 2013-10-15 ENCOUNTER — Ambulatory Visit: Payer: BC Managed Care – PPO | Admitting: Rehabilitative and Restorative Service Providers"

## 2013-10-15 DIAGNOSIS — IMO0001 Reserved for inherently not codable concepts without codable children: Secondary | ICD-10-CM | POA: Diagnosis present

## 2013-10-15 DIAGNOSIS — M216X9 Other acquired deformities of unspecified foot: Secondary | ICD-10-CM | POA: Diagnosis not present

## 2013-10-15 DIAGNOSIS — R269 Unspecified abnormalities of gait and mobility: Secondary | ICD-10-CM | POA: Diagnosis not present

## 2013-10-15 DIAGNOSIS — R279 Unspecified lack of coordination: Secondary | ICD-10-CM | POA: Diagnosis not present

## 2013-10-15 DIAGNOSIS — E1142 Type 2 diabetes mellitus with diabetic polyneuropathy: Secondary | ICD-10-CM | POA: Diagnosis not present

## 2013-10-20 ENCOUNTER — Telehealth: Payer: Self-pay | Admitting: Neurology

## 2013-10-20 ENCOUNTER — Telehealth: Payer: Self-pay

## 2013-10-20 NOTE — Telephone Encounter (Signed)
Matthew [name] DOB [id number] is calling--patient is having a hard time keeping his balance--has fallen 4 times since last night--please call and advise--thank you.

## 2013-10-20 NOTE — Telephone Encounter (Signed)
Matthew [name] DOB [id number] is calling--patient is having a hard time keeping his balance--has fallen 4 times since last night--please call patient and advise.

## 2013-10-21 NOTE — Telephone Encounter (Signed)
erx antivert 12.5 mg one 3 times daily as needed #21 for vertigo, I advise him to contact neurologist re fall

## 2013-10-21 NOTE — Telephone Encounter (Signed)
Spoke to patient and he relayed that he went to one of the ER's in [place] and had an infection in his leg, in which he was prescribed antibiodics.  He stated this may have contributed to his balance issues.  He stated he will continue on the antibiodics and if has similar symptoms with his balance after a few days he will let us know.

## 2013-10-21 NOTE — Telephone Encounter (Signed)
Please advise.  Can meclizine be sent in ?

## 2013-10-22 ENCOUNTER — Encounter: Payer: Self-pay | Admitting: Internal Medicine

## 2013-10-22 ENCOUNTER — Ambulatory Visit (INDEPENDENT_AMBULATORY_CARE_PROVIDER_SITE_OTHER): Payer: BC Managed Care – PPO | Admitting: Internal Medicine

## 2013-10-22 VITALS — BP 122/70 | HR 68 | Ht 69.0 in | Wt 264.4 lb

## 2013-10-22 DIAGNOSIS — I4729 Other ventricular tachycardia: Secondary | ICD-10-CM

## 2013-10-22 DIAGNOSIS — I2589 Other forms of chronic ischemic heart disease: Secondary | ICD-10-CM

## 2013-10-22 DIAGNOSIS — Z95810 Presence of automatic (implantable) cardiac defibrillator: Secondary | ICD-10-CM

## 2013-10-22 DIAGNOSIS — I255 Ischemic cardiomyopathy: Secondary | ICD-10-CM

## 2013-10-22 DIAGNOSIS — I5022 Chronic systolic (congestive) heart failure: Secondary | ICD-10-CM

## 2013-10-22 DIAGNOSIS — I472 Ventricular tachycardia: Secondary | ICD-10-CM

## 2013-10-22 LAB — MDC_IDC_ENUM_SESS_TYPE_INCLINIC
Date Time Interrogation Session: 20150819040000
HighPow Impedance: 28 Ohm
HighPow Impedance: 49 Ohm
Implantable Pulse Generator Serial Number: 168307
Lead Channel Impedance Value: 468 Ohm
Lead Channel Pacing Threshold Amplitude: 0.4 V
Lead Channel Pacing Threshold Pulse Width: 0.4 ms
Lead Channel Sensing Intrinsic Amplitude: 15.5 mV
Lead Channel Sensing Intrinsic Amplitude: 3.1 mV
Lead Channel Setting Pacing Amplitude: 2 V
Lead Channel Setting Pacing Amplitude: 2.4 V
Lead Channel Setting Pacing Pulse Width: 0.4 ms
Lead Channel Setting Sensing Sensitivity: 0.5 mV
MDC IDC MSMT LEADCHNL RA IMPEDANCE VALUE: 487 Ohm
MDC IDC MSMT LEADCHNL RV PACING THRESHOLD AMPLITUDE: 1 V
MDC IDC MSMT LEADCHNL RV PACING THRESHOLD PULSEWIDTH: 0.4 ms
MDC IDC SET ZONE DETECTION INTERVAL: 333 ms
Zone Setting Detection Interval: 286 ms

## 2013-10-22 NOTE — Patient Instructions (Addendum)
Your physician recommends that you continue on your current medications as directed. Please refer to the Current Medication list given to you today.  emote monitoring is used to monitor your  ICD from home. This monitoring reduces the number of office visits required to check your device to one time per year. It allows [place] to keep an eye on the functioning of your device to ensure it is working properly. You are scheduled for a device check from home on 01-26-2014. You may send your transmission at any time that day. If you have a wireless device, the transmission will be sent automatically. After your physician reviews your transmission, you will receive a postcard with your next transmission date.  Your physician recommends that you schedule a follow-up appointment in: 12 months with Dr.Taylor in [place].

## 2013-10-22 NOTE — Assessment & Plan Note (Signed)
He has had no sustained VT. Will follow.

## 2013-10-22 NOTE — Progress Notes (Signed)
HPI Mr. Senkbeil returns today for followup. He is a pleasant 48 yo man with a h/o an ICM, chronic systolic heart failure, nonsustained ventricular tachycardia, morbid obesity, and diabetes. In the interim, he continues to experience class II heart failure. He also has nonexertional chest discomfort, which is not been thought to be due to coronary disease or unstable angina.no recent ICD shocks and no syncope. He does get dyspnea with exertion. He has unfortunately had eye problems including retinal dettachment with re-attachments. His vision is very poor. The prognosis for his vision is guarded. Allergies  Allergen Reactions  . Ace Inhibitors Cough  . Beta Adrenergic Blockers Cough     Current Outpatient Prescriptions  Medication Sig Dispense Refill  . aspirin 325 MG tablet Take 325 mg by mouth daily.        . baclofen (LIORESAL) 10 MG tablet Take 1 tablet (10 mg total) by mouth at bedtime.  30 each  3  . busPIRone (BUSPAR) 5 MG tablet Take 5 mg by mouth 3 (three) times daily.      . cephALEXin (KEFLEX) 500 MG capsule Take 500 mg by mouth.      . Chromium Picolinate 200 MCG CAPS Take 200 mcg by mouth daily.       . Cinnamon 500 MG TABS Take 1,000 mg by mouth daily.       . clonazePAM (KLONOPIN) 0.5 MG tablet Take 1 tablet (0.5 mg total) by mouth 2 (two) times daily.  60 tablet  2  . ezetimibe (ZETIA) 10 MG tablet Take 1 tablet (10 mg total) by mouth daily.  90 tablet  3  . FLUoxetine (PROZAC) 20 MG capsule Take 60 mg by mouth daily.      . fluticasone (FLONASE) 50 MCG/ACT nasal spray Place 2 sprays into both nostrils daily.  16 g  6  . furosemide (LASIX) 40 MG tablet Take 1 tablet (40 mg total) by mouth daily.  90 tablet  3  . gabapentin (NEURONTIN) 300 MG capsule Take 1 capsule (300 mg total) by mouth at bedtime.  90 capsule  5  . HYDROcodone-acetaminophen (NORCO/VICODIN) 5-325 MG per tablet       . Insulin Glargine (LANTUS SOLOSTAR) 100 UNIT/ML SOPN 70 units at bedtime  21 pen  3  . insulin  lispro (HUMALOG) 100 UNIT/ML injection Inject 12 Units into the skin 3 (three) times daily before meals. Sliding scale      . losartan (COZAAR) 50 MG tablet Take 1 tablet (50 mg total) by mouth daily.  30 tablet  5  . methylcellulose (ARTIFICIAL TEARS) 1 % ophthalmic solution Place 1 drop into both eyes 2 (two) times daily as needed (dry eyes).      . metoprolol tartrate (LOPRESSOR) 25 MG tablet Take 1 tablet (25 mg total) by mouth 2 (two) times daily.  180 tablet  0  . Multiple Vitamin (MULTIVITAMIN) tablet Take 1 tablet by mouth daily.        . nitroGLYCERIN (NITROSTAT) 0.4 MG SL tablet Place 0.4 mg under the tongue every 5 (five) minutes as needed for chest pain.      . Omega-3 Fatty Acids (FISH OIL BURP-LESS PO) Take 1 tablet by mouth daily.        . prednisoLONE acetate (PRED FORTE) 1 % ophthalmic suspension Place 1 drop into both eyes 4 (four) times daily.      . rosuvastatin (CRESTOR) 20 MG tablet Take 1 tablet (20 mg total) by mouth daily.  90 tablet  3   No current facility-administered medications for this visit.     Past Medical History  Diagnosis Date  . Cardiac arrest - ventricular fibrillation 08/26/2009  . CAD (coronary artery disease)     a. OOH MI 6/11; presented with acute CHF; hosp course c/b VF arrest with VDRF, etc;   b s/p CABG: L-LAD/Dx, S-OM/dCFX;  c.Nuclear scan 8/12:  Inferior and inf-lat scar with minimal peri-infarct ischemia, EF 53%.; d.LHC 9/12:  LAD 90%, pD1 (small) 70%, prox large Dx 95%, L-LAD and Dx ok, CFX occluded, S-OM and CFX ok, RCA prox to prox/mid 60-70%.  Medical management     . Ischemic cardiomyopathy     echo 10/11:  inf-septal and apical HK, EF 35%, mod LAE  . Diabetic coma with ketoacidosis     Occured in 1994 where he spent 30 days in a coma . He develpoed infection of his leg and developing pancreatitis during that  time   . DM2 (diabetes mellitus, type 2)   . Cellulitis 2012    spider bite  . HTN (hypertension)   . HLD (hyperlipidemia)   .  Chronic systolic heart failure   . Thyroid disease   . Coma   . Headache(784.0)     sinus  . Anxiety     stress  . CKD (chronic kidney disease)   . Neuropathy   . Carpal tunnel syndrome of right wrist   . 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 Abnormality of gait 04/08/2013  . 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 Foot drop, bilateral 04/08/2013  . Obesity   . Anxiety and depression   . Retinal detachment     Bilateral, laser surgery on the right  . Nocturnal leg cramps     ROS:   All systems reviewed and negative except as noted in the HPI.   Past Surgical History  Procedure Laterality Date  . Coronary artery bypass grafting x4  june 29,2011    x 4  . Tonsillectomy  1998  . Appendectomy  mid 1990  . Right sided abdominal cyst removal    . Carpal tunnel with cubital tunnel  02/22/2012    Procedure: CARPAL TUNNEL WITH CUBITAL TUNNEL;  Surgeon: Roseanne Kaufman, MD;  Location: [facility];  Service: Orthopedics;  Laterality: Right;  Right Carpal Tunnel Release/Right Cubital Tunnel Release and Transposition if Necessary with Flexor Pronator Release  . Cardiac surgery    . Pacemaker insertion    . Ulnar nerve transposition Right      Family History  Problem Relation Age of Onset  . Cancer Maternal Grandmother   . Allergies Mother   . Diabetes Mother   . Hypertension Mother   . Hyperlipidemia Mother   . Dementia Mother   . Stroke Father   . Allergies Father   . Dementia Father   . Alcohol abuse Father   . Diabetes Sister   . Heart disease Sister   . Anxiety disorder Sister   . Diabetes Sister   . Allergies Sister   . Schizophrenia Paternal Uncle   . Schizophrenia Cousin      History   Social History  . Marital Status: Divorced    Spouse Name: N/A    Number of Children: 0  . Years of Education: college   Occupational History  . staffing   .     Social History Main Topics  . Smoking status: Never Smoker   . Smokeless tobacco: Current User  Types: Snuff  . Alcohol Use: Yes     Comment: rarely  . Drug Use: No  . Sexual Activity: No   Other Topics Concern  . Not on file   Social History Narrative  . No narrative on file     BP 122/70  Pulse 68  Ht 5\' 9"  (1.753 m)  Wt 264 lb 6.4 oz (119.931 kg)  BMI 39.03 kg/m2  Physical Exam:  obese appearing 48 year old man,NAD HEENT: Unremarkable Neck:  7 cm JVD, no thyromegally Back:  No CVA tenderness Lungs:  Clear with no wheezes, rales, or rhonchi. HEART:  Regular rate rhythm, no murmurs, no rubs, no clicks Abd:  Soft, obese, positive bowel sounds, no organomegally, no rebound, no guarding Ext:  2 plus pulses, no edema, no cyanosis, no clubbing Skin:  No rashes no nodules Neuro:  CN II through XII intact, motor grossly intact   DEVICE  Normal device function.  See PaceArt for details.   Assess/Plan:

## 2013-10-22 NOTE — Assessment & Plan Note (Signed)
His [employer] ICD is working normally. Will follow.

## 2013-10-22 NOTE — Assessment & Plan Note (Signed)
His symptoms are class 2. He will continue his current meds and maintain a low sodium diet. I have encouraged him to continue losing weight (lost 10 lbs).

## 2013-10-23 MED ORDER — MECLIZINE HCL 12.5 MG PO TABS: 12.5000 mg | ORAL_TABLET | Freq: Three times a day (TID) | ORAL | Status: DC | PRN

## 2013-10-23 NOTE — Addendum Note (Signed)
Addended by: Denman George B on: 10/23/2013 10:03 AM   Modules accepted: Orders

## 2013-10-23 NOTE — Telephone Encounter (Signed)
Patient states that he did contact neuro but his doctor is currently out of town.  Aware of rx sent to pharmacy.  States that he is also currently on ABT for a skin infection to leg.  Soreness noted from fall.  Will collect rx today and call back if he needs anything else.

## 2013-10-28 ENCOUNTER — Encounter (HOSPITAL_COMMUNITY): Payer: Self-pay | Admitting: Psychiatry

## 2013-10-28 ENCOUNTER — Ambulatory Visit (INDEPENDENT_AMBULATORY_CARE_PROVIDER_SITE_OTHER): Payer: BC Managed Care – PPO | Admitting: Psychiatry

## 2013-10-28 ENCOUNTER — Ambulatory Visit: Payer: BC Managed Care – PPO | Admitting: Rehabilitative and Restorative Service Providers"

## 2013-10-28 VITALS — BP 127/63 | HR 61 | Ht 69.0 in | Wt 262.6 lb

## 2013-10-28 DIAGNOSIS — F411 Generalized anxiety disorder: Secondary | ICD-10-CM

## 2013-10-28 DIAGNOSIS — F3289 Other specified depressive episodes: Secondary | ICD-10-CM

## 2013-10-28 DIAGNOSIS — F4001 Agoraphobia with panic disorder: Secondary | ICD-10-CM

## 2013-10-28 DIAGNOSIS — F329 Major depressive disorder, single episode, unspecified: Secondary | ICD-10-CM

## 2013-10-28 DIAGNOSIS — IMO0001 Reserved for inherently not codable concepts without codable children: Secondary | ICD-10-CM | POA: Diagnosis not present

## 2013-10-28 MED ORDER — CLONAZEPAM 0.5 MG PO TABS: 0.5000 mg | ORAL_TABLET | Freq: Two times a day (BID) | ORAL | Status: DC

## 2013-10-28 MED ORDER — FLUOXETINE HCL 20 MG PO CAPS: 60.0000 mg | ORAL_CAPSULE | Freq: Every day | ORAL | Status: DC

## 2013-10-28 MED ORDER — BUSPIRONE HCL 5 MG PO TABS: 5.0000 mg | ORAL_TABLET | Freq: Three times a day (TID) | ORAL | Status: DC
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
## 2013-11-04 ENCOUNTER — Encounter: Payer: Self-pay | Admitting: Rehabilitative and Restorative Service Providers"

## 2013-11-05 ENCOUNTER — Encounter: Payer: Self-pay | Admitting: Internal Medicine

## 2013-11-06 ENCOUNTER — Ambulatory Visit: Payer: BC Managed Care – PPO | Admitting: Family Medicine

## 2013-11-07 ENCOUNTER — Other Ambulatory Visit: Payer: Self-pay | Admitting: Urology

## 2013-11-07 ENCOUNTER — Ambulatory Visit (INDEPENDENT_AMBULATORY_CARE_PROVIDER_SITE_OTHER): Payer: BC Managed Care – PPO | Admitting: Urology

## 2013-11-07 DIAGNOSIS — R3129 Other microscopic hematuria: Secondary | ICD-10-CM

## 2013-11-07 DIAGNOSIS — N529 Male erectile dysfunction, unspecified: Secondary | ICD-10-CM

## 2013-11-11 ENCOUNTER — Ambulatory Visit: Payer: BC Managed Care – PPO | Attending: Neurology | Admitting: Rehabilitative and Restorative Service Providers"

## 2013-11-11 DIAGNOSIS — E1142 Type 2 diabetes mellitus with diabetic polyneuropathy: Secondary | ICD-10-CM | POA: Insufficient documentation

## 2013-11-11 DIAGNOSIS — R269 Unspecified abnormalities of gait and mobility: Secondary | ICD-10-CM | POA: Diagnosis not present

## 2013-11-11 DIAGNOSIS — R279 Unspecified lack of coordination: Secondary | ICD-10-CM | POA: Insufficient documentation

## 2013-11-11 DIAGNOSIS — IMO0001 Reserved for inherently not codable concepts without codable children: Secondary | ICD-10-CM | POA: Diagnosis present

## 2013-11-11 DIAGNOSIS — M216X9 Other acquired deformities of unspecified foot: Secondary | ICD-10-CM | POA: Diagnosis not present

## 2013-11-18 NOTE — Assessment & Plan Note (Signed)
Xray of both elbows and eval by ortho

## 2013-11-18 NOTE — Assessment & Plan Note (Signed)
Improved. Pt applauded on succesful weight loss through lifestyle change, and encouraged to continue same. Weight loss goal set for the next several months.  

## 2013-11-18 NOTE — Assessment & Plan Note (Signed)
Uncontrolled Updated lab needed at/ before next visit. Hyperlipidemia:Low fat diet discussed and encouraged.   

## 2013-11-18 NOTE — Assessment & Plan Note (Signed)
Chronic chest pain no acute decompensation since past 3 month

## 2013-11-18 NOTE — Assessment & Plan Note (Signed)
Recurrent falls, currently being followed by neurology, Fall precautions are reviewedd

## 2013-11-18 NOTE — Assessment & Plan Note (Signed)
C/o increased pain xrays ordered, no recent trauma

## 2013-11-18 NOTE — Assessment & Plan Note (Signed)
Improved, pt applauded on this, Mabnaged by endocrinology Patient advised to reduce carb and sweets, commit to regular physical activity, take meds as prescribed, test blood as directed, and attempt to lose weight, to improve blood sugar control.

## 2013-11-18 NOTE — Assessment & Plan Note (Signed)
Uncontrolled, pt to resume ARB Low sodium diet and regular physical activity as tolerated, discussed and encouraged

## 2013-11-20 ENCOUNTER — Ambulatory Visit: Payer: Self-pay | Admitting: Orthopedic Surgery

## 2013-11-22 ENCOUNTER — Other Ambulatory Visit: Payer: Self-pay | Admitting: Family Medicine

## 2013-11-22 ENCOUNTER — Emergency Department
Admission: EM | Admit: 2013-11-22 | Discharge: 2013-11-22 | Disposition: A | Discharge status: Home / Self Care | Payer: BC Managed Care – PPO | Source: Home / Self Care | Attending: Emergency Medicine | Admitting: Emergency Medicine

## 2013-11-22 ENCOUNTER — Encounter: Payer: Self-pay | Admitting: Emergency Medicine

## 2013-11-22 DIAGNOSIS — L03116 Cellulitis of left lower limb: Secondary | ICD-10-CM

## 2013-11-22 DIAGNOSIS — L03119 Cellulitis of unspecified part of limb: Secondary | ICD-10-CM

## 2013-11-22 DIAGNOSIS — L02419 Cutaneous abscess of limb, unspecified: Secondary | ICD-10-CM

## 2013-11-22 MED ORDER — CEFTRIAXONE SODIUM 1 G IJ SOLR
1.0000 g | Freq: Once | INTRAMUSCULAR | Status: AC
  Administered 2013-11-22: 1 g via INTRAMUSCULAR

## 2013-11-22 MED ORDER — DOXYCYCLINE HYCLATE 100 MG PO CAPS: 100.0000 mg | ORAL_CAPSULE | Freq: Two times a day (BID) | ORAL | Status: DC

## 2013-11-22 NOTE — ED Provider Notes (Signed)
CSN: [id number]     Arrival date & time 11/22/13  1359 History   First MD Initiated Contact with Patient 11/22/13 1411     Chief Complaint  Patient presents with  . Wound Infection    right ankle    HPI Matthew [name] c/o infected wound to posterior left lower leg. He noticed a burning/tingling feeling about 5 days ago, then progressive redness and swelling left lower leg for the past 5 days, then wound opened up last night, draining yellow pus.  Denies fever. +chills last night.  Has not tried any particular treatment for this. He feels the symptoms are moderate and progressive  Associated symptoms: No nausea or vomiting. Denies chest pain or shortness of breath. Denies abdominal pain. Denies calf pain. Past medical history of chronic diabetic neuropathy on legs. Denies any insect bite or snake bite or trauma that he knows of. Currently Denies pain, as he states he has chronic neuropathy in both legs.  He states he was treated elsewhere for cellulitis right leg(treated with a course of Keflex about 3 weeks ago) which had seemed to resolve the cellulitis . He states his diabetes has been controlled.  He states his PCP is Dr. Tula Nakayama  Past Medical History  Diagnosis Date  . Cardiac arrest - ventricular fibrillation 08/26/2009  . CAD (coronary artery disease)     a. OOH MI 6/11; presented with acute CHF; hosp course c/b VF arrest with VDRF, etc;   b s/p CABG: L-LAD/Dx, S-OM/dCFX;  c.Nuclear scan 8/12:  Inferior and inf-lat scar with minimal peri-infarct ischemia, EF 53%.; d.LHC 9/12:  LAD 90%, pD1 (small) 70%, prox large Dx 95%, L-LAD and Dx ok, CFX occluded, S-OM and CFX ok, RCA prox to prox/mid 60-70%.  Medical management     . Ischemic cardiomyopathy     echo 10/11:  inf-septal and apical HK, EF 35%, mod LAE  . Diabetic coma with ketoacidosis     Occured in 1994 where he spent 30 days in a coma . He develpoed infection of his leg and developing pancreatitis during that  time   .  DM2 (diabetes mellitus, type 2)   . Cellulitis 2012    spider bite  . HTN (hypertension)   . HLD (hyperlipidemia)   . Chronic systolic heart failure   . Thyroid disease   . Coma   . Headache(784.0)     sinus  . Anxiety     stress  . CKD (chronic kidney disease)   . Neuropathy   . Carpal tunnel syndrome of right wrist   . 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 Abnormality of gait 04/08/2013  . 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 Foot drop, bilateral 04/08/2013  . Obesity   . Anxiety and depression   . Retinal detachment     Bilateral, laser surgery on the right  . Nocturnal leg cramps    Past Surgical History  Procedure Laterality Date  . Coronary artery bypass grafting x4  june 29,2011    x 4  . Tonsillectomy  1998  . Appendectomy  mid 1990  . Right sided abdominal cyst removal    . Carpal tunnel with cubital tunnel  02/22/2012    Procedure: CARPAL TUNNEL WITH CUBITAL TUNNEL;  Surgeon: Roseanne Kaufman, MD;  Location: [facility];  Service: Orthopedics;  Laterality: Right;  Right Carpal Tunnel Release/Right Cubital Tunnel Release and Transposition if Necessary with Flexor Pronator Release  . Cardiac surgery    .  Pacemaker insertion    . Ulnar nerve transposition Right    Family History  Problem Relation Age of Onset  . Cancer Maternal Grandmother   . Allergies Mother   . Diabetes Mother   . Hypertension Mother   . Hyperlipidemia Mother   . Dementia Mother   . Stroke Father   . Allergies Father   . Dementia Father   . Alcohol abuse Father   . Diabetes Sister   . Heart disease Sister   . Anxiety disorder Sister   . Diabetes Sister   . Allergies Sister   . Schizophrenia Paternal Uncle   . Schizophrenia Cousin    History  Substance Use Topics  . Smoking status: Never Smoker   . Smokeless tobacco: Current User    Types: Snuff  . Alcohol Use: Yes     Comment: rarely    Review of Systems  All other systems reviewed and are negative.   Allergies  Ace  inhibitors and Beta adrenergic blockers  Home Medications   Prior to Admission medications   Medication Sig Start Date End Date Taking? Authorizing Provider  aspirin 325 MG tablet Take 325 mg by mouth daily.      Historical Provider, MD  baclofen (LIORESAL) 10 MG tablet Take 1 tablet (10 mg total) by mouth at bedtime. 09/30/13   Kathrynn Ducking, MD  busPIRone (BUSPAR) 5 MG tablet Take 1 tablet (5 mg total) by mouth 3 (three) times daily. 10/28/13   Levonne Spiller, MD  Chromium Picolinate 200 MCG CAPS Take 200 mcg by mouth daily.     Historical Provider, MD  clonazePAM (KLONOPIN) 0.5 MG tablet Take 1 tablet (0.5 mg total) by mouth 2 (two) times daily. 10/28/13 10/28/14  Levonne Spiller, MD  doxycycline (VIBRAMYCIN) 100 MG capsule Take 1 capsule (100 mg total) by mouth 2 (two) times daily. 11/22/13   Jacqulyn Cane, MD  ezetimibe (ZETIA) 10 MG tablet Take 1 tablet (10 mg total) by mouth daily. 06/23/13   [name], MD  FLUoxetine (PROZAC) 20 MG capsule Take 3 capsules (60 mg total) by mouth daily. 10/28/13   Levonne Spiller, MD  fluticasone [facility]) 50 MCG/ACT nasal spray Place 2 sprays into both nostrils daily. 05/29/13   [name], MD  furosemide (LASIX) 40 MG tablet Take 1 tablet (40 mg total) by mouth daily. 10/16/12   Evans Lance, MD  gabapentin (NEURONTIN) 300 MG capsule Take 1 capsule (300 mg total) by mouth at bedtime. 05/13/13   Carole Civil, MD  HYDROcodone-acetaminophen (NORCO/VICODIN) 5-325 MG per tablet  10/21/13   Historical Provider, MD  Insulin Glargine (LANTUS SOLOSTAR) 100 UNIT/ML SOPN 70 units at bedtime 12/05/12   [name], MD  insulin lispro (HUMALOG) 100 UNIT/ML injection Inject 12 Units into the skin 3 (three) times daily before meals. Sliding scale 11/21/12   [name], MD  losartan (COZAAR) 50 MG tablet Take 1 tablet (50 mg total) by mouth daily. 09/30/13   [name], MD  meclizine (ANTIVERT) 12.5 MG tablet Take 1 tablet (12.5 mg total) by  mouth 3 (three) times daily as needed for dizziness. 10/23/13   [name], MD  methylcellulose (ARTIFICIAL TEARS) 1 % ophthalmic solution Place 1 drop into both eyes 2 (two) times daily as needed (dry eyes).    Historical Provider, MD  metoprolol tartrate (LOPRESSOR) 25 MG tablet Take 1 tablet (25 mg total) by mouth 2 (two) times daily. 06/18/13   [name],  MD  Multiple Vitamin (MULTIVITAMIN) tablet Take 1 tablet by mouth daily.      Historical Provider, MD  nitroGLYCERIN (NITROSTAT) 0.4 MG SL tablet Place 0.4 mg under the tongue every 5 (five) minutes as needed for chest pain. 11/01/10 10/28/13  Liliane Shi, PA-C  Omega-3 Fatty Acids (FISH OIL BURP-LESS PO) Take 1 tablet by mouth daily.      Historical Provider, MD  prednisoLONE acetate (PRED FORTE) 1 % ophthalmic suspension Place 1 drop into both eyes 4 (four) times daily.    Historical Provider, MD  rosuvastatin (CRESTOR) 20 MG tablet Take 1 tablet (20 mg total) by mouth daily. 06/23/13   [name], MD   BP 151/83  Pulse 70  Temp(Src) 98 F (36.7 C) (Oral)  Ht 5\' 9"  (1.753 m)  Wt 258 lb (117.028 kg)  BMI 38.08 kg/m2  SpO2 97% Physical Exam  Nursing note and vitals reviewed. Constitutional: He is oriented to person, place, and time. He appears well-developed and well-nourished. No distress.  Pleasant, overweight male, no acute distress. He is here with his father. Patient walks with a cane.  HENT:  Head: Normocephalic and atraumatic.  Eyes: Conjunctivae and EOM are normal. Pupils are equal, round, and reactive to light. No scleral icterus.  Neck: Normal range of motion. No JVD present.  Cardiovascular: Normal rate.   Pulmonary/Chest: Effort normal and breath sounds normal. No respiratory distress.  Abdominal: He exhibits no distension. There is no tenderness.  Musculoskeletal: Normal range of motion.       Left lower leg: He exhibits no bony tenderness.       Legs: Left lower leg: 3 x 3 cm area of redness,  induration, swelling, with open wound in the center draining foul-smelling yellow pus. No fluctuance Nontender No red streaks.  Negative Homans sign bilaterally. No calf tenderness or cords or heat.  He has trace pretibial edema bilaterally  Neurological: He is alert and oriented to person, place, and time.  Chronic decreased sensation both lower extremities. Capillary refill lower extremities is intact and equal bilaterally  Skin: Skin is warm.  Psychiatric: He has a normal mood and affect.    ED Course  Procedures (including critical care time) Labs Review Labs Reviewed  WOUND CULTURE    Imaging Review No results found.   MDM   1. Cellulitis of leg, left    is already draining without fluctuance. In my opinion, no surgical indication at this time. Clinically, no evidence of DVT or osteomyelitis. I feel he can be managed as an outpatient, with close followup, but I explained to him that if he has any severe or worsening symptoms or if he's not improving with conservative treatment, either needs to go to emergency room stat or will refer to a specialist, such as a surgeon or wound care specialty clinic.  Over 25 minutes spent, greater than 50% of the time spent for counseling and coordination of care.  Treatment options discussed, as well as risks, benefits, alternatives. Patient voiced understanding and agreement with the following plans:  Rocephin 1 g IM stat Swab for wound culture obtained. Then, wound cleansed with Hibiclens and irrigated with sterile water. Dressing applied  Verbal and written instructions given, including the following in the AVS that was printed and given to patient:   Doxycycline 100 mg twice a day.--We have already sent this prescription electronically to your pharmacy   follow your doctor's directions for changing the gauze.  To avoid spreading the infection:  Keep your abscess covered with a bandage.  Wash your hands well.  Do not share  personal care items, towels, or whirlpools with others.  Avoid skin contact with others.  Keep your skin and clothes clean around the abscess.  Keep all doctor visits as told.--- Return  here to urgent care in one or 2 days to recheck and we can redress wound GET HELP RIGHT AWAY IF:   You have more pain, puffiness (swelling), or redness in the wound site.  You have more fluid or blood coming from the wound site.  You have muscle aches, chills, or you feel sick.  You have a fever.  Today, we sent a swab of the wound for culture.   Follow-up here or with your primary care doctor in 1-2 days. Advised in any case to followup with PCP within 1 week for general recheck and recheck diabetes. Precautions discussed. Red flags discussed. Questions invited and answered. Patient voiced understanding and agreement.     Jacqulyn Cane, MD 11/22/13 [phone]

## 2013-11-22 NOTE — ED Notes (Signed)
Christain c/o infected wound to posterior right ankle. He noticed a burning/tingling feeling about 5 days ago, wound opened up last night, draining pus. Denies fever. C/o chills last night. Hx neuropathy on legs, diabetic and cellulitis 3 weeks ago.

## 2013-11-22 NOTE — Discharge Instructions (Signed)
Abscess/cellulitis left leg An abscess (boil or furuncle) is an infected area on or under the skin. This area is filled with yellowish-white fluid (pus) and other material (debris). HOME CARE   Only take medicines as told by your doctor.  If you were given antibiotic medicine, take it as directed. Doxycycline 100 mg twice a day.--We have already sent this prescription electronically to your pharmacy  If gauze is used, follow your doctor's directions for changing the gauze.  To avoid spreading the infection:  Keep your abscess covered with a bandage.  Wash your hands well.  Do not share personal care items, towels, or whirlpools with others.  Avoid skin contact with others.  Keep your skin and clothes clean around the abscess.  Keep all doctor visits as told.--- Return  here to urgent care in one or 2 days to recheck and we can redress wound GET HELP RIGHT AWAY IF:   You have more pain, puffiness (swelling), or redness in the wound site.  You have more fluid or blood coming from the wound site.  You have muscle aches, chills, or you feel sick.  You have a fever.  Today, we sent a swab of the wound for culture.  MAKE SURE YOU:   Understand these instructions.  Will watch your condition.  Will get help right away if you are not doing well or get worse.

## 2013-11-22 NOTE — ED Notes (Signed)
Cleaned with Hibiclens, dressed with bacitracin, nonstick and coban.

## 2013-11-24 ENCOUNTER — Encounter: Payer: Self-pay | Admitting: Emergency Medicine

## 2013-11-24 ENCOUNTER — Ambulatory Visit (HOSPITAL_COMMUNITY)
Admission: RE | Admit: 2013-11-24 | Discharge: 2013-11-24 | Disposition: A | Discharge status: Home / Self Care | Payer: BC Managed Care – PPO | Source: Ambulatory Visit | Attending: Urology | Admitting: Urology

## 2013-11-24 ENCOUNTER — Emergency Department (INDEPENDENT_AMBULATORY_CARE_PROVIDER_SITE_OTHER)
Admission: EM | Admit: 2013-11-24 | Discharge: 2013-11-24 | Disposition: A | Discharge status: Home / Self Care | Payer: BC Managed Care – PPO | Source: Home / Self Care | Attending: Emergency Medicine | Admitting: Emergency Medicine

## 2013-11-24 DIAGNOSIS — L02419 Cutaneous abscess of limb, unspecified: Secondary | ICD-10-CM

## 2013-11-24 DIAGNOSIS — R319 Hematuria, unspecified: Secondary | ICD-10-CM | POA: Insufficient documentation

## 2013-11-24 DIAGNOSIS — L03119 Cellulitis of unspecified part of limb: Secondary | ICD-10-CM

## 2013-11-24 DIAGNOSIS — L03115 Cellulitis of right lower limb: Secondary | ICD-10-CM

## 2013-11-24 DIAGNOSIS — R3129 Other microscopic hematuria: Secondary | ICD-10-CM

## 2013-11-24 MED ORDER — IOHEXOL 300 MG/ML  SOLN
125.0000 mL | Freq: Once | INTRAMUSCULAR | Status: AC | PRN
  Administered 2013-11-24: 125 mL via INTRAVENOUS

## 2013-11-24 MED ORDER — AMOXICILLIN-POT CLAVULANATE 875-125 MG PO TABS: 1.0000 | ORAL_TABLET | Freq: Two times a day (BID) | ORAL | Status: DC

## 2013-11-24 NOTE — ED Provider Notes (Signed)
CSN: [id number]     Arrival date & time 11/24/13  1154 History   First MD Initiated Contact with Patient 11/24/13 1222     Chief Complaint  Patient presents with  . Wound Check   (Consider location/radiation/quality/duration/timing/severity/associated sxs/prior Treatment) HPI Here for recheck cellulitis right leg. Reviewed extensive notes from 9/19. He feels that he is mildly improving. No fever or chills. Drainage is decreasing. Less pain  Past Medical History  Diagnosis Date  . Cardiac arrest - ventricular fibrillation 08/26/2009  . CAD (coronary artery disease)     a. OOH MI 6/11; presented with acute CHF; hosp course c/b VF arrest with VDRF, etc;   b s/p CABG: L-LAD/Dx, S-OM/dCFX;  c.Nuclear scan 8/12:  Inferior and inf-lat scar with minimal peri-infarct ischemia, EF 53%.; d.LHC 9/12:  LAD 90%, pD1 (small) 70%, prox large Dx 95%, L-LAD and Dx ok, CFX occluded, S-OM and CFX ok, RCA prox to prox/mid 60-70%.  Medical management     . Ischemic cardiomyopathy     echo 10/11:  inf-septal and apical HK, EF 35%, mod LAE  . Diabetic coma with ketoacidosis     Occured in 1994 where he spent 30 days in a coma . He develpoed infection of his leg and developing pancreatitis during that  time   . DM2 (diabetes mellitus, type 2)   . Cellulitis 2012    spider bite  . HTN (hypertension)   . HLD (hyperlipidemia)   . Chronic systolic heart failure   . Thyroid disease   . Coma   . Headache(784.0)     sinus  . Anxiety     stress  . CKD (chronic kidney disease)   . Neuropathy   . Carpal tunnel syndrome of right wrist   . 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 Abnormality of gait 04/08/2013  . 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 Foot drop, bilateral 04/08/2013  . Obesity   . Anxiety and depression   . Retinal detachment     Bilateral, laser surgery on the right  . Nocturnal leg cramps    Past Surgical History  Procedure Laterality Date  . Coronary artery bypass grafting x4   june 29,2011    x 4  . Tonsillectomy  1998  . Appendectomy  mid 1990  . Right sided abdominal cyst removal    . Carpal tunnel with cubital tunnel  02/22/2012    Procedure: CARPAL TUNNEL WITH CUBITAL TUNNEL;  Surgeon: Roseanne Kaufman, MD;  Location: [facility];  Service: Orthopedics;  Laterality: Right;  Right Carpal Tunnel Release/Right Cubital Tunnel Release and Transposition if Necessary with Flexor Pronator Release  . Cardiac surgery    . Pacemaker insertion    . Ulnar nerve transposition Right    Family History  Problem Relation Age of Onset  . Cancer Maternal Grandmother   . Allergies Mother   . Diabetes Mother   . Hypertension Mother   . Hyperlipidemia Mother   . Dementia Mother   . Stroke Father   . Allergies Father   . Dementia Father   . Alcohol abuse Father   . Diabetes Sister   . Heart disease Sister   . Anxiety disorder Sister   . Diabetes Sister   . Allergies Sister   . Schizophrenia Paternal Uncle   . Schizophrenia Cousin    History  Substance Use Topics  . Smoking status: Never Smoker   . Smokeless tobacco: Current User    Types: Snuff  .  Alcohol Use: Yes     Comment: rarely    Review of Systems  All other systems reviewed and are negative.   Allergies  Ace inhibitors and Beta adrenergic blockers  Home Medications   Prior to Admission medications   Medication Sig Start Date End Date Taking? Authorizing Provider  amoxicillin-clavulanate (AUGMENTIN) 875-125 MG per tablet Take 1 tablet by mouth 2 (two) times daily. For 10 days. Take with food. 11/24/13   Jacqulyn Cane, MD  aspirin 325 MG tablet Take 325 mg by mouth daily.      Historical Provider, MD  baclofen (LIORESAL) 10 MG tablet Take 1 tablet (10 mg total) by mouth at bedtime. 09/30/13   Kathrynn Ducking, MD  busPIRone (BUSPAR) 5 MG tablet Take 1 tablet (5 mg total) by mouth 3 (three) times daily. 10/28/13   Levonne Spiller, MD  Chromium Picolinate 200 MCG CAPS Take 200 mcg by mouth daily.     Historical  Provider, MD  clonazePAM (KLONOPIN) 0.5 MG tablet Take 1 tablet (0.5 mg total) by mouth 2 (two) times daily. 10/28/13 10/28/14  Levonne Spiller, MD  doxycycline (VIBRAMYCIN) 100 MG capsule Take 1 capsule (100 mg total) by mouth 2 (two) times daily. 11/22/13   Jacqulyn Cane, MD  ezetimibe (ZETIA) 10 MG tablet Take 1 tablet (10 mg total) by mouth daily. 06/23/13   [name], MD  FLUoxetine (PROZAC) 20 MG capsule Take 3 capsules (60 mg total) by mouth daily. 10/28/13   Levonne Spiller, MD  fluticasone [facility]) 50 MCG/ACT nasal spray Place 2 sprays into both nostrils daily. 05/29/13   [name], MD  furosemide (LASIX) 40 MG tablet Take 1 tablet (40 mg total) by mouth daily. 10/16/12   Evans Lance, MD  gabapentin (NEURONTIN) 300 MG capsule Take 1 capsule (300 mg total) by mouth at bedtime. 05/13/13   Carole Civil, MD  HYDROcodone-acetaminophen (NORCO/VICODIN) 5-325 MG per tablet  10/21/13   Historical Provider, MD  Insulin Glargine (LANTUS SOLOSTAR) 100 UNIT/ML SOPN 70 units at bedtime 12/05/12   [name], MD  insulin lispro (HUMALOG) 100 UNIT/ML injection Inject 12 Units into the skin 3 (three) times daily before meals. Sliding scale 11/21/12   [name], MD  losartan (COZAAR) 50 MG tablet Take 1 tablet (50 mg total) by mouth daily. 09/30/13   [name], MD  meclizine (ANTIVERT) 12.5 MG tablet Take 1 tablet (12.5 mg total) by mouth 3 (three) times daily as needed for dizziness. 10/23/13   [name], MD  methylcellulose (ARTIFICIAL TEARS) 1 % ophthalmic solution Place 1 drop into both eyes 2 (two) times daily as needed (dry eyes).    Historical Provider, MD  metoprolol tartrate (LOPRESSOR) 25 MG tablet Take 1 tablet (25 mg total) by mouth 2 (two) times daily. 06/18/13   [name], MD  Multiple Vitamin (MULTIVITAMIN) tablet Take 1 tablet by mouth daily.      Historical Provider, MD  nitroGLYCERIN (NITROSTAT) 0.4 MG SL tablet Place 0.4 mg under the tongue  every 5 (five) minutes as needed for chest pain. 11/01/10 10/28/13  Liliane Shi, PA-C  Omega-3 Fatty Acids (FISH OIL BURP-LESS PO) Take 1 tablet by mouth daily.      Historical Provider, MD  prednisoLONE acetate (PRED FORTE) 1 % ophthalmic suspension Place 1 drop into both eyes 4 (four) times daily.    Historical Provider, MD  rosuvastatin (CRESTOR) 20 MG tablet Take 1 tablet (20 mg total) by mouth  daily. 06/23/13   [name], MD   BP 154/89  Pulse 70  Temp(Src) 97.7 F (36.5 C) (Oral)  Resp 16  Ht 5\' 9"  (1.753 m)  Wt 268 lb (121.564 kg)  BMI 39.56 kg/m2  SpO2 99% Physical Exam  Nursing note and vitals reviewed. Constitutional: He is oriented to person, place, and time. He appears well-developed and well-nourished. No distress.  Pleasant, overweight male, no acute distress. He is here with his father. Patient walks with a cane.  HENT:  Head: Normocephalic and atraumatic.  Eyes: Conjunctivae and EOM are normal. Pupils are equal, round, and reactive to light. No scleral icterus.  Neck: Normal range of motion. No JVD present.  Cardiovascular: Normal rate.   Pulmonary/Chest: Effort normal and breath sounds normal. No respiratory distress.  Abdominal: He exhibits no distension. There is no tenderness.  Musculoskeletal: Normal range of motion.       Left lower leg: He exhibits no bony tenderness.       Legs: Left lower leg: 3 x 3 cm area of redness, induration, swelling, with wound in the center , granulating in, scant  yellow pus.--- This is improved from 2 days ago. No fluctuance Nontender No red streaks.  Negative Homans sign bilaterally. No calf tenderness or cords or heat.  He has trace pretibial edema bilaterally  Neurological: He is alert and oriented to person, place, and time.  Chronic decreased sensation both lower extremities. Capillary refill lower extremities is intact and equal bilaterally  Skin: Skin is warm.  Psychiatric: He has a normal mood and affect.     ED Course  Procedures (including critical care time) Labs Review Labs Reviewed - No data to display  Results for orders placed during the hospital encounter of 11/22/13  WOUND CULTURE      Result Value Ref Range   Preliminary Report Moderate GROUP B STREP (S.AGALACTIAE) ISOLATED     Lab Comments: "Testing against S. agalactiae not routinely performed due to predictability of AMP/PEN/VAN susceptibility."  Imaging Review No results found.   MDM   1. Cellulitis of leg, right     Gradually improving. Less swollen and less drainage cellulitis right leg . Treatment options discussed, as well as risks, benefits, alternatives. He declined another shot of Rocephin today. Patient voiced understanding and agreement with the following plans: Continue doxycycline for any staph coverage. Reviewed above preliminary report of wound culture, will add Augmentin 875 twice a day to cover strep agalactiae. Today, wound cleansed with Hibiclens and dried. Dressed with nonstick dressing with bacitracin and a loose wrap with coban, (making sure  not too tight.) Advised to recheck here or with PCP in 3-4 days, sooner if worse or if any red flags.  Follow-up with your primary care doctor in 5-7 days if not improving, or sooner if symptoms become worse. Precautions discussed. Red flags discussed. Questions invited and answered. Patient voiced understanding and agreement.    Jacqulyn Cane, MD 11/24/13 [phone]

## 2013-11-24 NOTE — ED Notes (Signed)
The pt is here today for a wound check of his RT ankle.

## 2013-11-25 ENCOUNTER — Ambulatory Visit (INDEPENDENT_AMBULATORY_CARE_PROVIDER_SITE_OTHER): Payer: BC Managed Care – PPO | Admitting: Orthopedic Surgery

## 2013-11-25 ENCOUNTER — Encounter: Payer: Self-pay | Admitting: Orthopedic Surgery

## 2013-11-25 VITALS — BP 134/71 | Ht 69.0 in | Wt 268.0 lb

## 2013-11-25 DIAGNOSIS — M67919 Unspecified disorder of synovium and tendon, unspecified shoulder: Secondary | ICD-10-CM | POA: Insufficient documentation

## 2013-11-25 DIAGNOSIS — M719 Bursopathy, unspecified: Principal | ICD-10-CM
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
## 2013-11-25 NOTE — Patient Instructions (Signed)
Joint Injection  Care After  Refer to this sheet in the next few days. These instructions provide you with information on caring for yourself after you have had a joint injection. Your caregiver also may give you more specific instructions. Your treatment has been planned according to current medical practices, but problems sometimes occur. Call your caregiver if you have any problems or questions after your procedure.  After any type of joint injection, it is not uncommon to experience:  · Soreness, swelling, or bruising around the injection site.  · Mild numbness, tingling, or weakness around the injection site caused by the numbing medicine used before or with the injection.  It also is possible to experience the following effects associated with the specific agent after injection:  · Iodine-based contrast agents:  ¨ Allergic reaction (itching, hives, widespread redness, and swelling beyond the injection site).  · Corticosteroids (These effects are rare.):  ¨ Allergic reaction.  ¨ Increased blood sugar levels (If you have diabetes and you notice that your blood sugar levels have increased, notify your caregiver).  ¨ Increased blood pressure levels.  ¨ Mood swings.  · Hyaluronic acid in the use of viscosupplementation.  ¨ Temporary heat or redness.  ¨ Temporary rash and itching.  ¨ Increased fluid accumulation in the injected joint.  These effects all should resolve within a day after your procedure.   HOME CARE INSTRUCTIONS  · Limit yourself to light activity the day of your procedure. Avoid lifting heavy objects, bending, stooping, or twisting.  · Take prescription or over-the-counter pain medication as directed by your caregiver.  · You may apply ice to your injection site to reduce pain and swelling the day of your procedure. Ice may be applied 03-04 times:  ¨ Put ice in a plastic bag.  ¨ Place a towel between your skin and the bag.  ¨ Leave the ice on for no longer than 15-20 minutes each time.  SEEK  IMMEDIATE MEDICAL CARE IF:   · Pain and swelling get worse rather than better or extend beyond the injection site.  · Numbness does not go away.  · Blood or fluid continues to leak from the injection site.  · You have chest pain.  · You have swelling of your face or tongue.  · You have trouble breathing or you become dizzy.  · You develop a fever, chills, or severe tenderness at the injection site that last longer than 1 day.  MAKE SURE YOU:  · Understand these instructions.  · Watch your condition.  · Get help right away if you are not doing well or if you get worse.  Document Released: 11/03/2010 Document Revised: 05/15/2011 Document Reviewed: 11/03/2010  [employer]® Patient Information ©2015 [employer]. This information is not intended to replace advice given to you by your health care provider. Make sure you discuss any questions you have with your health care provider.

## 2013-11-26 ENCOUNTER — Telehealth: Payer: Self-pay | Admitting: *Deleted

## 2013-11-26 LAB — WOUND CULTURE
Gram Stain: NONE SEEN
Gram Stain: NONE SEEN

## 2013-12-03 LAB — HM DIABETES EYE EXAM

## 2013-12-11 ENCOUNTER — Ambulatory Visit (INDEPENDENT_AMBULATORY_CARE_PROVIDER_SITE_OTHER): Payer: BC Managed Care – PPO | Admitting: Family Medicine

## 2013-12-11 ENCOUNTER — Encounter (INDEPENDENT_AMBULATORY_CARE_PROVIDER_SITE_OTHER): Payer: Self-pay

## 2013-12-11 ENCOUNTER — Encounter: Payer: Self-pay | Admitting: Family Medicine

## 2013-12-11 DIAGNOSIS — I10 Essential (primary) hypertension: Secondary | ICD-10-CM

## 2013-12-11 DIAGNOSIS — IMO0001 Reserved for inherently not codable concepts without codable children: Secondary | ICD-10-CM

## 2013-12-11 DIAGNOSIS — M2550 Pain in unspecified joint: Secondary | ICD-10-CM

## 2013-12-11 DIAGNOSIS — M62838 Other muscle spasm: Secondary | ICD-10-CM

## 2013-12-11 DIAGNOSIS — E785 Hyperlipidemia, unspecified: Secondary | ICD-10-CM

## 2013-12-11 DIAGNOSIS — Z794 Long term (current) use of insulin: Secondary | ICD-10-CM

## 2013-12-11 DIAGNOSIS — Z23 Encounter for immunization: Secondary | ICD-10-CM

## 2013-12-11 DIAGNOSIS — E1165 Type 2 diabetes mellitus with hyperglycemia: Secondary | ICD-10-CM
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
## 2013-12-12 LAB — MICROALBUMIN / CREATININE URINE RATIO
Creatinine, Urine: 120.5 mg/dL
MICROALB/CREAT RATIO: 963.5 mg/g — AB (ref 0.0–30.0)
Microalb, Ur: 116.1 mg/dL — ABNORMAL HIGH (ref <2.0)

## 2013-12-18 ENCOUNTER — Ambulatory Visit: Payer: BC Managed Care – PPO | Admitting: Orthopedic Surgery

## 2013-12-19 ENCOUNTER — Ambulatory Visit: Payer: Self-pay | Admitting: Urology

## 2013-12-25 ENCOUNTER — Ambulatory Visit (INDEPENDENT_AMBULATORY_CARE_PROVIDER_SITE_OTHER): Payer: BC Managed Care – PPO | Admitting: Psychiatry

## 2013-12-25 ENCOUNTER — Encounter (HOSPITAL_COMMUNITY): Payer: Self-pay | Admitting: Psychiatry

## 2013-12-25 VITALS — BP 130/84 | Ht 69.0 in | Wt 262.0 lb

## 2013-12-25 DIAGNOSIS — F4001 Agoraphobia with panic disorder: Secondary | ICD-10-CM

## 2013-12-25 DIAGNOSIS — F411 Generalized anxiety disorder: Secondary | ICD-10-CM

## 2013-12-25 DIAGNOSIS — F32A Depression, unspecified: Secondary | ICD-10-CM

## 2013-12-25 DIAGNOSIS — F329 Major depressive disorder, single episode, unspecified: Secondary | ICD-10-CM

## 2013-12-25 MED ORDER — BUSPIRONE HCL 5 MG PO TABS: 5.0000 mg | ORAL_TABLET | Freq: Three times a day (TID) | ORAL | Status: DC

## 2013-12-25 MED ORDER — FLUOXETINE HCL 20 MG PO CAPS: 60.0000 mg | ORAL_CAPSULE | Freq: Every day | ORAL | Status: DC

## 2013-12-25 MED ORDER — CLONAZEPAM 0.5 MG PO TABS: 0.5000 mg | ORAL_TABLET | Freq: Two times a day (BID) | ORAL | Status: DC
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
## 2013-12-30 ENCOUNTER — Encounter: Payer: Self-pay | Admitting: Orthopedic Surgery

## 2013-12-30 ENCOUNTER — Ambulatory Visit: Payer: BC Managed Care – PPO | Admitting: Orthopedic Surgery

## 2013-12-30 ENCOUNTER — Telehealth: Payer: Self-pay | Admitting: Orthopedic Surgery

## 2013-12-30 NOTE — Telephone Encounter (Signed)
Opened in error

## 2014-01-03 DIAGNOSIS — Z23 Encounter for immunization: Secondary | ICD-10-CM | POA: Insufficient documentation

## 2014-01-03 NOTE — Assessment & Plan Note (Signed)
Vaccine administered at visit.  

## 2014-01-15 ENCOUNTER — Other Ambulatory Visit: Payer: Self-pay | Admitting: Internal Medicine

## 2014-01-26 ENCOUNTER — Encounter: Payer: BC Managed Care – PPO | Admitting: *Deleted

## 2014-01-26 ENCOUNTER — Telehealth: Payer: Self-pay | Admitting: Cardiology

## 2014-01-26 NOTE — Telephone Encounter (Signed)
Called pt to confirm remote transmission scheduled for today. He stated that he is still waiting on cell adapter.

## 2014-01-27 ENCOUNTER — Encounter (HOSPITAL_COMMUNITY): Payer: Self-pay | Admitting: Psychology

## 2014-01-27 ENCOUNTER — Ambulatory Visit (INDEPENDENT_AMBULATORY_CARE_PROVIDER_SITE_OTHER): Payer: BC Managed Care – PPO | Admitting: Psychology

## 2014-01-27 DIAGNOSIS — F329 Major depressive disorder, single episode, unspecified: Secondary | ICD-10-CM

## 2014-01-27 DIAGNOSIS — F4001 Agoraphobia with panic disorder: Secondary | ICD-10-CM

## 2014-01-27 DIAGNOSIS — F32A Depression, unspecified: Secondary | ICD-10-CM
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
## 2014-02-20 ENCOUNTER — Ambulatory Visit: Payer: Self-pay | Admitting: Urology

## 2014-02-25 ENCOUNTER — Ambulatory Visit (INDEPENDENT_AMBULATORY_CARE_PROVIDER_SITE_OTHER): Payer: BC Managed Care – PPO | Admitting: Psychology

## 2014-02-25 ENCOUNTER — Encounter (HOSPITAL_COMMUNITY): Payer: Self-pay | Admitting: Psychology

## 2014-02-25 DIAGNOSIS — F32A Depression, unspecified: Secondary | ICD-10-CM

## 2014-02-25 DIAGNOSIS — F4001 Agoraphobia with panic disorder: Secondary | ICD-10-CM

## 2014-02-25 DIAGNOSIS — F329 Major depressive disorder, single episode, unspecified: Secondary | ICD-10-CM
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
## 2014-03-27 ENCOUNTER — Ambulatory Visit (HOSPITAL_COMMUNITY): Payer: Self-pay | Admitting: Psychiatry

## 2014-03-30 ENCOUNTER — Ambulatory Visit (HOSPITAL_COMMUNITY): Payer: Self-pay | Admitting: Psychology

## 2014-03-31 ENCOUNTER — Telehealth: Payer: Self-pay | Admitting: Neurology

## 2014-03-31 NOTE — Telephone Encounter (Signed)
Juliann Pulse with [employer] is calling regarding a medical statement Dr. Jannifer Franklin filled out in regard to patient's social security disability.  Juliann Pulse is having trouble reading some of the info such as diagnois, date, etc.  Please call.

## 2014-04-01 NOTE — Telephone Encounter (Signed)
Spoke with Juliann Pulse at [employer] and relayed diagnoses to her in regards to patient.

## 2014-04-03 ENCOUNTER — Encounter: Payer: Self-pay | Admitting: *Deleted

## 2014-04-16 ENCOUNTER — Ambulatory Visit (HOSPITAL_COMMUNITY): Payer: Self-pay | Admitting: Psychiatry

## 2014-04-23 ENCOUNTER — Ambulatory Visit (INDEPENDENT_AMBULATORY_CARE_PROVIDER_SITE_OTHER): Payer: Self-pay | Admitting: Psychiatry

## 2014-04-23 ENCOUNTER — Encounter (HOSPITAL_COMMUNITY): Payer: Self-pay | Admitting: Psychiatry

## 2014-04-23 VITALS — BP 153/85 | HR 74 | Ht 69.0 in | Wt 265.0 lb

## 2014-04-23 DIAGNOSIS — F411 Generalized anxiety disorder: Secondary | ICD-10-CM

## 2014-04-23 DIAGNOSIS — F329 Major depressive disorder, single episode, unspecified: Secondary | ICD-10-CM

## 2014-04-23 DIAGNOSIS — F4001 Agoraphobia with panic disorder: Secondary | ICD-10-CM

## 2014-04-23 DIAGNOSIS — F418 Other specified anxiety disorders: Secondary | ICD-10-CM

## 2014-04-23 MED ORDER — CLONAZEPAM 1 MG PO TABS: 1.0000 mg | ORAL_TABLET | Freq: Three times a day (TID) | ORAL | Status: DC

## 2014-04-23 MED ORDER — FLUOXETINE HCL 20 MG PO CAPS: 60.0000 mg | ORAL_CAPSULE | Freq: Every day | ORAL | Status: DC

## 2014-04-23 MED ORDER — BUSPIRONE HCL 5 MG PO TABS: 5.0000 mg | ORAL_TABLET | Freq: Three times a day (TID) | ORAL | Status: DC
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
## 2014-04-28 ENCOUNTER — Ambulatory Visit (HOSPITAL_COMMUNITY): Payer: Self-pay | Admitting: Psychology

## 2014-05-27 ENCOUNTER — Ambulatory Visit: Payer: Self-pay | Admitting: Neurology

## 2014-05-27 ENCOUNTER — Telehealth: Payer: Self-pay | Admitting: Neurology

## 2014-05-27 NOTE — Telephone Encounter (Signed)
This patient did not show for a revisit appointment today. 

## 2014-06-02 ENCOUNTER — Encounter: Payer: Self-pay | Admitting: Neurology

## 2014-06-02 DIAGNOSIS — Z0289 Encounter for other administrative examinations: Secondary | ICD-10-CM

## 2014-06-05 ENCOUNTER — Other Ambulatory Visit: Payer: Self-pay | Admitting: Family Medicine

## 2014-06-19 ENCOUNTER — Ambulatory Visit (HOSPITAL_COMMUNITY): Payer: Self-pay | Admitting: Psychiatry

## 2014-06-26 ENCOUNTER — Ambulatory Visit (INDEPENDENT_AMBULATORY_CARE_PROVIDER_SITE_OTHER): Payer: 59 | Admitting: Psychiatry

## 2014-06-26 ENCOUNTER — Encounter (HOSPITAL_COMMUNITY): Payer: Self-pay | Admitting: Psychiatry

## 2014-06-26 VITALS — BP 161/84 | HR 86 | Ht 69.0 in | Wt 260.4 lb

## 2014-06-26 DIAGNOSIS — F329 Major depressive disorder, single episode, unspecified: Secondary | ICD-10-CM

## 2014-06-26 DIAGNOSIS — F411 Generalized anxiety disorder: Secondary | ICD-10-CM | POA: Diagnosis not present

## 2014-06-26 DIAGNOSIS — F4001 Agoraphobia with panic disorder: Secondary | ICD-10-CM

## 2014-06-26 DIAGNOSIS — F418 Other specified anxiety disorders: Secondary | ICD-10-CM

## 2014-06-26 MED ORDER — BUSPIRONE HCL 5 MG PO TABS: 5.0000 mg | ORAL_TABLET | Freq: Three times a day (TID) | ORAL | Status: DC

## 2014-06-26 MED ORDER — CLONAZEPAM 1 MG PO TABS: 1.0000 mg | ORAL_TABLET | Freq: Three times a day (TID) | ORAL | Status: DC

## 2014-06-26 MED ORDER — FLUOXETINE HCL 20 MG PO CAPS: 60.0000 mg | ORAL_CAPSULE | Freq: Every day | ORAL | Status: DC
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
## 2014-08-26 LAB — HM DIABETES EYE EXAM

## 2014-09-25 ENCOUNTER — Encounter (HOSPITAL_COMMUNITY): Payer: Self-pay | Admitting: Psychiatry

## 2014-09-25 ENCOUNTER — Ambulatory Visit (HOSPITAL_COMMUNITY): Payer: Self-pay | Admitting: Psychiatry

## 2014-10-01 ENCOUNTER — Encounter: Payer: Self-pay | Admitting: Family Medicine

## 2014-10-01 ENCOUNTER — Ambulatory Visit (INDEPENDENT_AMBULATORY_CARE_PROVIDER_SITE_OTHER): Payer: 59 | Admitting: Family Medicine

## 2014-10-01 VITALS — BP 120/74 | HR 60 | Resp 16 | Ht 69.0 in | Wt 267.0 lb

## 2014-10-01 DIAGNOSIS — M21371 Foot drop, right foot: Secondary | ICD-10-CM

## 2014-10-01 DIAGNOSIS — I4729 Other ventricular tachycardia: Secondary | ICD-10-CM

## 2014-10-01 DIAGNOSIS — I2510 Atherosclerotic heart disease of native coronary artery without angina pectoris: Secondary | ICD-10-CM

## 2014-10-01 DIAGNOSIS — F17200 Nicotine dependence, unspecified, uncomplicated: Secondary | ICD-10-CM

## 2014-10-01 DIAGNOSIS — E1165 Type 2 diabetes mellitus with hyperglycemia: Principal | ICD-10-CM

## 2014-10-01 DIAGNOSIS — I10 Essential (primary) hypertension: Secondary | ICD-10-CM | POA: Diagnosis not present

## 2014-10-01 DIAGNOSIS — E785 Hyperlipidemia, unspecified: Secondary | ICD-10-CM | POA: Diagnosis not present

## 2014-10-01 DIAGNOSIS — Z114 Encounter for screening for human immunodeficiency virus [HIV]: Secondary | ICD-10-CM | POA: Diagnosis not present

## 2014-10-01 DIAGNOSIS — IMO0001 Reserved for inherently not codable concepts without codable children: Secondary | ICD-10-CM

## 2014-10-01 DIAGNOSIS — E1065 Type 1 diabetes mellitus with hyperglycemia: Secondary | ICD-10-CM | POA: Diagnosis not present

## 2014-10-01 DIAGNOSIS — Z1211 Encounter for screening for malignant neoplasm of colon: Secondary | ICD-10-CM

## 2014-10-01 DIAGNOSIS — M21372 Foot drop, left foot: Secondary | ICD-10-CM

## 2014-10-01 DIAGNOSIS — F4001 Agoraphobia with panic disorder: Secondary | ICD-10-CM

## 2014-10-01 DIAGNOSIS — I5022 Chronic systolic (congestive) heart failure: Secondary | ICD-10-CM

## 2014-10-01 DIAGNOSIS — R269 Unspecified abnormalities of gait and mobility: Secondary | ICD-10-CM

## 2014-10-01 DIAGNOSIS — Z720 Tobacco use: Secondary | ICD-10-CM

## 2014-10-01 DIAGNOSIS — I472 Ventricular tachycardia: Secondary | ICD-10-CM

## 2014-10-01 DIAGNOSIS — Z794 Long term (current) use of insulin: Principal | ICD-10-CM

## 2014-10-01 DIAGNOSIS — N521 Erectile dysfunction due to diseases classified elsewhere: Secondary | ICD-10-CM

## 2014-10-01 DIAGNOSIS — G4733 Obstructive sleep apnea (adult) (pediatric): Secondary | ICD-10-CM

## 2014-10-01 LAB — CBC
HCT: 42.9 % (ref 39.0–52.0)
Hemoglobin: 14.3 g/dL (ref 13.0–17.0)
MCH: 28.5 pg (ref 26.0–34.0)
MCHC: 33.3 g/dL (ref 30.0–36.0)
MCV: 85.5 fL (ref 78.0–100.0)
MPV: 9.4 fL (ref 8.6–12.4)
Platelets: 129 10*3/uL — ABNORMAL LOW (ref 150–400)
RBC: 5.02 MIL/uL (ref 4.22–5.81)
RDW: 14.7 % (ref 11.5–15.5)
WBC: 7.4 10*3/uL (ref 4.0–10.5)

## 2014-10-01 NOTE — Patient Instructions (Addendum)
Annual wellness initial in 4 month, call if you need me before  please  Labs today  Please schedule appt with Dr Luretha Murphy will be referred to Dr Jannifer Franklin and cardiology  Work on stopping snuff ENTIRELY  Thankful that you are doing better  I will refill the 3 meds from Dr Harrington Challenger if needed, ensure you have a f/u with her pls  Please work on weight loss

## 2014-10-01 NOTE — Progress Notes (Signed)
Subjective:    Patient ID: Matthew [name], male    DOB: February 15, 1966, 49 y.o.   MRN: [id number]  HPI    Yaroslav Gombos     MRN: [id number]      DOB: 10-08-1965   HPI Mr. Zylstra is here for follow up and re-evaluation of chronic medical conditions, medication management and review of any available recent lab and radiology data.  Preventive health is updated, specifically  Cancer screening and Immunization.  Needs and is ready for his colonoscopy Questions or concerns regarding consultations or procedures which the PT has had in the interim are  Addressed Needs f/u with cardiology and neurology and possible pulmonary also needs to see endocrinologist The PT denies any adverse reactions to current medications since the last visit.  Blood sugar testing has not been as consistent as needed, due to increased stress with sibling and his Mom's illness Blood sugars noted to be elevated when he does test  ROS Denies recent fever or chills. Denies sinus pressure, nasal congestion, ear pain or sore throat. Denies chest congestion, productive cough or wheezing. Denies chest pains, palpitations and leg swelling Denies abdominal pain, nausea, vomiting,diarrhea or constipation.   Denies dysuria, frequency, hesitancy or incontinence. Denies uncontrolled  joint pain, swelling does have  limitation in mobility.Ambulates with a cane for safety Denies headaches, seizures, numbness, or tingling. Denies uncontrolled  depression, anxiety or insomnia.Increased stress and grief as lost one of his siblings in past 2 months Denies skin break down or rash.   PE  BP 120/74 mmHg  Pulse 60  Resp 16  Ht 5\' 9"  (1.753 m)  Wt 267 lb (121.11 kg)  BMI 39.41 kg/m2  SpO2 97%  Patient alert and oriented and in no cardiopulmonary distress.  HEENT: No facial asymmetry, EOMI,   oropharynx pink and moist.  Neck supple no JVD, no mass.  Chest: Clear to auscultation bilaterally.  CVS: S1, S2 no murmurs, no S3.Regular  rate.     Ext: No edema  MS: Adequate though reduced  ROM spine, shoulders, hips and knees.Abnormal gait due to foot drop  Skin: Intact, no ulcerations or rash noted.  Psych: Good eye contact, normal affect. Memory intact not anxious or depressed appearing.  CNS: CN 3-12 intact, power,  normal throughout.decrreeased sensation in lower extremities  Assessment & Plan   HYPERTENSION, BENIGN Controlled, no change in medication DASH diet and commitment to daily physical activity for a minimum of 30 minutes discussed and encouraged, as a part of hypertension management. The importance of attaining a healthy weight is also discussed.  BP/Weight 10/01/2014 12/11/2013 11/25/2013 11/24/2013 11/22/2013 10/22/2013 7/84/6962  Systolic BP 952 841 324 401 027 253 664  Diastolic BP 74 82 71 89 83 70 90  Wt. (Lbs) 267 265 268 268 258 264.4 263  BMI 39.41 39.12 39.56 39.56 38.08 39.03 38.82  Some encounter information is confidential and restricted. Go to Review Flowsheets activity to see all data.        VENTRICULAR TACHYCARDIA defibrillator in place, denies any spontaneous shocks since last visit , needs to have f/u with cardiology  Chronic systolic heart failure Asymptomatic and stable currently , needs cardiology follow up, will refer , last seen in 10/2013  Coronary atherosclerosis of native coronary artery Denies any recent chest pain , however needs cardiology re evla will refer for annual exam  Diabetes mellitus, insulin dependent (IDDM), uncontrolled Deteriorated, needs to re establishe with endo Mr. Zumstein is reminded of the importance of commitment to  daily physical activity for 30 minutes or more, as able and the need to limit carbohydrate intake to 30 to 60 grams per meal to help with blood sugar control.   The need to take medication as prescribed, test blood sugar as directed, and to call between visits if there is a concern that blood sugar is uncontrolled is also discussed.    Mr. Fruth is reminded of the importance of daily foot exam, annual eye examination, and good blood sugar, blood pressure and cholesterol control.  Diabetic Labs Latest Ref Rng 10/01/2014 12/11/2013 09/26/2013 06/13/2013 10/02/2012  HbA1c <5.7 % 9.8(H) - 8.0(H) - 11.7(H)  Microalbumin <2.0 mg/dL - 116.1(H) - - 151.44(H)  Micro/Creat Ratio 0.0 - 30.0 mg/g - 963.5(H) - - 623.5(H)  Chol 125 - 200 mg/dL 152 - - 169 266(H)  HDL >=40 mg/dL 24(L) - - 26(L) 37(L)  Calc LDL <130 mg/dL 76 - - 81 -  Triglycerides <150 mg/dL 262(H) - - 311(H) 414(H)  Creatinine 0.70 - 1.33 mg/dL 1.68(H) - - - 1.37(H)  GFR >60.00 mL/min - - - - -   BP/Weight 10/01/2014 12/11/2013 11/25/2013 11/24/2013 11/22/2013 10/22/2013 06/12/8117  Systolic BP 147 829 562 130 865 784 696  Diastolic BP 74 82 71 89 83 70 90  Wt. (Lbs) 267 265 268 268 258 264.4 263  BMI 39.41 39.12 39.56 39.56 38.08 39.03 38.82  Some encounter information is confidential and restricted. Go to Review Flowsheets activity to see all data.   Foot/eye exam completion dates Latest Ref Rng 08/26/2014 12/11/2013  Eye Exam No Retinopathy Retinopathy(A) -  Foot exam Order - - -  Foot Form Completion - - Done         Foot drop, bilateral Neuropathy with abnormal gait and foot drop, needs annual f/u with neurologist , will refer  Abnormality of gait Associated with neuropathy and foot drop, needs neurology re evaluation, referral entered  Male erectile dysfunction Followed by urology, still contemplating use of pump, not thrilled , has appt in am  NICOTINE ADDICTION Reports that he has cut back on the amount of snuff he is using, however unwilling to set a quit date Patient counseled for approximately 5 minutes regarding the health risks of ongoing nicotine use, specifically all types of cancer, heart disease, stroke and respiratory failure. The options available for help with cessation ,the behavioral changes to assist the process, and the option to either  gradully reduce usage  Or abruptly stop.is also discussed. Pt is also encouraged to set specific goals in number of cigarettes used daily, as well as to set a quit date.    Hyperlipidemia Hyperlipidemia:Low fat diet discussed and encouraged.   Lipid Panel  Lab Results  Component Value Date   CHOL 152 10/01/2014   HDL 24* 10/01/2014   LDLCALC 76 10/01/2014   LDLDIRECT 159* 10/02/2012   TRIG 262* 10/01/2014   CHOLHDL 6.3* 10/01/2014   Improved, though not at goal, no change in medication     Panic disorder with agoraphobia Improvement in symptoms  Under the care of psychiatry and  psychology  Morbid obesity Unchanged Patient re-educated about  the importance of commitment to a  minimum of 150 minutes of exercise per week.  The importance of healthy food choices with portion control discussed. Encouraged to start a food diary, count calories and to consider  joining a support group. Sample diet sheets offered. Goals set by the patient for the next several months.   Weight /BMI 10/01/2014 12/11/2013 11/25/2013  WEIGHT 267 lb 265 lb 268 lb  HEIGHT 5\' 9"  5\' 9"  5\' 9"   BMI 39.41 kg/m2 39.12 kg/m2 39.56 kg/m2  Some encounter information is confidential and restricted. Go to Review Flowsheets activity to see all data.    Current exercise per week 40 minutes.   OSA (obstructive sleep apnea) Importance of consistent use of CPAP is stressed     Review of Systems     Objective:   Physical Exam        Assessment & Plan:

## 2014-10-02 ENCOUNTER — Ambulatory Visit (INDEPENDENT_AMBULATORY_CARE_PROVIDER_SITE_OTHER): Payer: 59 | Admitting: Urology

## 2014-10-02 DIAGNOSIS — N5201 Erectile dysfunction due to arterial insufficiency: Secondary | ICD-10-CM | POA: Diagnosis not present

## 2014-10-02 DIAGNOSIS — R312 Other microscopic hematuria: Secondary | ICD-10-CM | POA: Diagnosis not present

## 2014-10-02 LAB — HEMOGLOBIN A1C
Hgb A1c MFr Bld: 9.8 % — ABNORMAL HIGH (ref <5.7)
MEAN PLASMA GLUCOSE: 235 mg/dL — AB (ref <117)

## 2014-10-02 LAB — COMPLETE METABOLIC PANEL WITH GFR
ALT: 17 U/L (ref 9–46)
AST: 17 U/L (ref 10–35)
Albumin: 3.8 g/dL (ref 3.6–5.1)
Alkaline Phosphatase: 79 U/L (ref 40–115)
BUN: 30 mg/dL — ABNORMAL HIGH (ref 7–25)
CALCIUM: 9.5 mg/dL (ref 8.6–10.3)
CO2: 27 mEq/L (ref 20–31)
CREATININE: 1.68 mg/dL — AB (ref 0.70–1.33)
Chloride: 104 mEq/L (ref 98–110)
GFR, Est African American: 54 mL/min — ABNORMAL LOW (ref >=60)
GFR, Est Non African American: 47 mL/min — ABNORMAL LOW (ref >=60)
Glucose, Bld: 186 mg/dL — ABNORMAL HIGH (ref 65–99)
Potassium: 5.8 mEq/L — ABNORMAL HIGH (ref 3.5–5.3)
SODIUM: 139 meq/L (ref 135–146)
TOTAL PROTEIN: 6.2 g/dL (ref 6.1–8.1)
Total Bilirubin: 0.6 mg/dL (ref 0.2–1.2)

## 2014-10-02 LAB — HIV ANTIBODY (ROUTINE TESTING W REFLEX): HIV 1&2 Ab, 4th Generation: NONREACTIVE

## 2014-10-02 LAB — LIPID PANEL
CHOL/HDL RATIO: 6.3 ratio — AB (ref <=5.0)
CHOLESTEROL: 152 mg/dL (ref 125–200)
HDL: 24 mg/dL — ABNORMAL LOW (ref >=40)
LDL Cholesterol: 76 mg/dL (ref <130)
TRIGLYCERIDES: 262 mg/dL — AB (ref <150)
VLDL: 52 mg/dL — ABNORMAL HIGH (ref <30)

## 2014-10-02 LAB — TSH: TSH: 1.322 u[IU]/mL (ref 0.350–4.500)

## 2014-10-03 ENCOUNTER — Encounter: Payer: Self-pay | Admitting: Family Medicine

## 2014-10-03 NOTE — Assessment & Plan Note (Signed)
Followed by urology, still contemplating use of pump, not thrilled , has appt in am

## 2014-10-03 NOTE — Assessment & Plan Note (Signed)
Improvement in symptoms  Under the care of psychiatry and  psychology

## 2014-10-03 NOTE — Assessment & Plan Note (Signed)
Importance of consistent use of CPAP is stressed

## 2014-10-03 NOTE — Assessment & Plan Note (Signed)
Deteriorated, needs to re establishe with endo Mr. Termini is reminded of the importance of commitment to daily physical activity for 30 minutes or more, as able and the need to limit carbohydrate intake to 30 to 60 grams per meal to help with blood sugar control.   The need to take medication as prescribed, test blood sugar as directed, and to call between visits if there is a concern that blood sugar is uncontrolled is also discussed.   Mr. Sposito is reminded of the importance of daily foot exam, annual eye examination, and good blood sugar, blood pressure and cholesterol control.  Diabetic Labs Latest Ref Rng 10/01/2014 12/11/2013 09/26/2013 06/13/2013 10/02/2012  HbA1c <5.7 % 9.8(H) - 8.0(H) - 11.7(H)  Microalbumin <2.0 mg/dL - 116.1(H) - - 151.44(H)  Micro/Creat Ratio 0.0 - 30.0 mg/g - 963.5(H) - - 623.5(H)  Chol 125 - 200 mg/dL 152 - - 169 266(H)  HDL >=40 mg/dL 24(L) - - 26(L) 37(L)  Calc LDL <130 mg/dL 76 - - 81 -  Triglycerides <150 mg/dL 262(H) - - 311(H) 414(H)  Creatinine 0.70 - 1.33 mg/dL 1.68(H) - - - 1.37(H)  GFR >60.00 mL/min - - - - -   BP/Weight 10/01/2014 12/11/2013 11/25/2013 11/24/2013 11/22/2013 10/22/2013 1/32/4401  Systolic BP 027 253 664 403 474 259 563  Diastolic BP 74 82 71 89 83 70 90  Wt. (Lbs) 267 265 268 268 258 264.4 263  BMI 39.41 39.12 39.56 39.56 38.08 39.03 38.82  Some encounter information is confidential and restricted. Go to Review Flowsheets activity to see all data.   Foot/eye exam completion dates Latest Ref Rng 08/26/2014 12/11/2013  Eye Exam No Retinopathy Retinopathy(A) -  Foot exam Order - - -  Foot Form Completion - - Done

## 2014-10-03 NOTE — Assessment & Plan Note (Signed)
Hyperlipidemia:Low fat diet discussed and encouraged.   Lipid Panel  Lab Results  Component Value Date   CHOL 152 10/01/2014   HDL 24* 10/01/2014   LDLCALC 76 10/01/2014   LDLDIRECT 159* 10/02/2012   TRIG 262* 10/01/2014   CHOLHDL 6.3* 10/01/2014   Improved, though not at goal, no change in medication

## 2014-10-03 NOTE — Assessment & Plan Note (Signed)
Denies any recent chest pain , however needs cardiology re evla will refer for annual exam

## 2014-10-03 NOTE — Assessment & Plan Note (Signed)
Asymptomatic and stable currently , needs cardiology follow up, will refer , last seen in 10/2013

## 2014-10-03 NOTE — Assessment & Plan Note (Signed)
Associated with neuropathy and foot drop, needs neurology re evaluation, referral entered

## 2014-10-03 NOTE — Assessment & Plan Note (Signed)
Reports that he has cut back on the amount of snuff he is using, however unwilling to set a quit date Patient counseled for approximately 5 minutes regarding the health risks of ongoing nicotine use, specifically all types of cancer, heart disease, stroke and respiratory failure. The options available for help with cessation ,the behavioral changes to assist the process, and the option to either gradully reduce usage  Or abruptly stop.is also discussed. Pt is also encouraged to set specific goals in number of cigarettes used daily, as well as to set a quit date.

## 2014-10-03 NOTE — Assessment & Plan Note (Signed)
Controlled, no change in medication DASH diet and commitment to daily physical activity for a minimum of 30 minutes discussed and encouraged, as a part of hypertension management. The importance of attaining a healthy weight is also discussed.  BP/Weight 10/01/2014 12/11/2013 11/25/2013 11/24/2013 11/22/2013 10/22/2013 4/60/4799  Systolic BP 872 158 727 618 [phone]  Diastolic BP 74 82 71 89 83 70 90  Wt. (Lbs) 267 265 268 268 258 264.4 263  BMI 39.41 39.12 39.56 39.56 38.08 39.03 38.82  Some encounter information is confidential and restricted. Go to Review Flowsheets activity to see all data.

## 2014-10-03 NOTE — Assessment & Plan Note (Signed)
Neuropathy with abnormal gait and foot drop, needs annual f/u with neurologist , will refer

## 2014-10-03 NOTE — Assessment & Plan Note (Signed)
defibrillator in place, denies any spontaneous shocks since last visit , needs to have f/u with cardiology

## 2014-10-03 NOTE — Assessment & Plan Note (Signed)
Unchanged Patient re-educated about  the importance of commitment to a  minimum of 150 minutes of exercise per week.  The importance of healthy food choices with portion control discussed. Encouraged to start a food diary, count calories and to consider  joining a support group. Sample diet sheets offered. Goals set by the patient for the next several months.   Weight /BMI 10/01/2014 12/11/2013 11/25/2013  WEIGHT 267 lb 265 lb 268 lb  HEIGHT 5\' 9"  5\' 9"  5\' 9"   BMI 39.41 kg/m2 39.12 kg/m2 39.56 kg/m2  Some encounter information is confidential and restricted. Go to Review Flowsheets activity to see all data.    Current exercise per week 40 minutes.

## 2014-10-06 ENCOUNTER — Encounter: Payer: Self-pay | Admitting: Gastroenterology

## 2014-10-16 ENCOUNTER — Ambulatory Visit: Payer: Self-pay | Admitting: Neurology

## 2014-10-19 ENCOUNTER — Ambulatory Visit: Payer: Self-pay | Admitting: Diagnostic Neuroimaging

## 2014-10-20 ENCOUNTER — Ambulatory Visit (INDEPENDENT_AMBULATORY_CARE_PROVIDER_SITE_OTHER): Payer: 59 | Admitting: Neurology

## 2014-10-20 ENCOUNTER — Encounter: Payer: Self-pay | Admitting: Neurology

## 2014-10-20 VITALS — BP 143/84 | HR 64 | Ht 69.0 in | Wt 264.5 lb

## 2014-10-20 DIAGNOSIS — R269 Unspecified abnormalities of gait and mobility: Secondary | ICD-10-CM | POA: Diagnosis not present

## 2014-10-20 DIAGNOSIS — M21372 Foot drop, left foot: Secondary | ICD-10-CM | POA: Diagnosis not present

## 2014-10-20 DIAGNOSIS — E1142 Type 2 diabetes mellitus with diabetic polyneuropathy: Secondary | ICD-10-CM | POA: Diagnosis not present

## 2014-10-20 DIAGNOSIS — M21371 Foot drop, right foot: Secondary | ICD-10-CM

## 2014-10-20 MED ORDER — BACLOFEN 10 MG PO TABS: 10.0000 mg | ORAL_TABLET | Freq: Every day | ORAL | Status: DC

## 2014-10-20 NOTE — Progress Notes (Signed)
Reason for visit: Peripheral neuropathy  Referring physician: Dr. Dagoberto Reef is a 49 y.o. male  History of present illness:  Mr. Ballantine is a 49 year old left-handed white male with a history of diabetes with a significant diabetic peripheral neuropathy. The patient was seen about a year ago, he failed to show up for a revisit appointment. The patient was to be set up for physical therapy to get AFO braces fitted, but this never occurred. The patient still has occasional falls, the last fall was about one week ago. He uses a walking stick when he is outside the house. 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 He believes that this has helped some. He has numbness in the legs, with occasional burning sensations, but in general he does not have a lot of pain in the legs and feet. He does report some leg cramps, particularly at night. He also has some vision changes associated with his diabetic retinopathy that also impairs his ability to walk safely. The patient returns to this office for further evaluation. He reports some numbness and weakness in the hands as well, at times has difficulty holding onto objects.  Past Medical History  Diagnosis Date  . Cardiac arrest - ventricular fibrillation 08/26/2009  . CAD (coronary artery disease)     a. OOH MI 6/11; presented with acute CHF; hosp course c/b VF arrest with VDRF, etc;   b s/p CABG: L-LAD/Dx, S-OM/dCFX;  c.Nuclear scan 8/12:  Inferior and inf-lat scar with minimal peri-infarct ischemia, EF 53%.; d.LHC 9/12:  LAD 90%, pD1 (small) 70%, prox large Dx 95%, L-LAD and Dx ok, CFX occluded, S-OM and CFX ok, RCA prox to prox/mid 60-70%.  Medical management     . Ischemic cardiomyopathy     echo 10/11:  inf-septal and apical HK, EF 35%, mod LAE  . Diabetic coma with ketoacidosis     Occured in 1994 where he spent 30 days in a coma . He develpoed infection of his leg and developing pancreatitis during that  time   . DM2 (diabetes  mellitus, type 2)   . Cellulitis 2012    spider bite  . HTN (hypertension)   . HLD (hyperlipidemia)   . Chronic systolic heart failure   . Thyroid disease   . Coma   . Headache(784.0)     sinus  . Anxiety     stress  . CKD (chronic kidney disease)   . Neuropathy   . Carpal tunnel syndrome of right wrist   . 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 Abnormality of gait 04/08/2013  . 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 Foot drop, bilateral 04/08/2013  . Obesity   . Anxiety and depression   . Retinal detachment     Bilateral, laser surgery on the right  . Nocturnal leg cramps   . History of degenerative disc disease   . Hypercholesteremia     Past Surgical History  Procedure Laterality Date  . Coronary artery bypass grafting x4  june 29,2011    x 4  . Tonsillectomy  1998  . Appendectomy  mid 1990  . Right sided abdominal cyst removal    . Carpal tunnel with cubital tunnel  02/22/2012    Procedure: CARPAL TUNNEL WITH CUBITAL TUNNEL;  Surgeon: Roseanne Kaufman, MD;  Location: [facility];  Service: Orthopedics;  Laterality: Right;  Right Carpal Tunnel Release/Right Cubital Tunnel Release and Transposition if Necessary with Flexor Pronator Release  .  Cardiac surgery    . Pacemaker insertion    . Ulnar nerve transposition Right   . Retinal detachment surgery    . Pacemaker placement    . Cardiac defibrillator placement      Family History  Problem Relation Age of Onset  . Cancer Maternal Grandmother   . Allergies Mother   . Diabetes Mother   . Hypertension Mother   . Hyperlipidemia Mother   . Dementia Mother   . Stroke Father   . Allergies Father   . Dementia Father   . Alcohol abuse Father   . Diabetes Sister   . Heart disease Sister   . Anxiety disorder Sister   . Diabetes Sister   . Allergies Sister   . Schizophrenia Paternal Uncle   . Schizophrenia Cousin     Social history:  reports that he has never smoked. His smokeless tobacco use includes Snuff. He  reports that he drinks alcohol. He reports that he does not use illicit drugs.  Medications:  Prior to Admission medications   Medication Sig Start Date End Date Taking? Authorizing Provider  aspirin 325 MG tablet Take 325 mg by mouth daily.     Yes Historical Provider, MD  baclofen (LIORESAL) 10 MG tablet Take 1 tablet (10 mg total) by mouth at bedtime. 09/30/13  Yes Kathrynn Ducking, MD  busPIRone (BUSPAR) 5 MG tablet Take 1 tablet (5 mg total) by mouth 3 (three) times daily. 06/26/14  Yes Cloria Spring, MD  Chromium Picolinate 200 MCG CAPS Take 200 mcg by mouth daily.    Yes Historical Provider, MD  clonazePAM (KLONOPIN) 1 MG tablet Take 1 tablet (1 mg total) by mouth 3 (three) times daily. 06/26/14 06/26/15 Yes Cloria Spring, MD  ezetimibe (ZETIA) 10 MG tablet Take 1 tablet (10 mg total) by mouth daily. 06/23/13  Yes [name], MD  FLUoxetine (PROZAC) 20 MG capsule Take 3 capsules (60 mg total) by mouth daily. 06/26/14  Yes Cloria Spring, MD  fluticasone [facility]) 50 MCG/ACT nasal spray Place 2 sprays into both nostrils daily. 05/29/13  Yes [name], MD  furosemide (LASIX) 40 MG tablet TAKE ONE TABLET BY MOUTH ONCE DAILY 01/16/14  Yes Evans Lance, MD  gabapentin (NEURONTIN) 300 MG capsule Take 1 capsule (300 mg total) by mouth at bedtime. 05/13/13  Yes Carole Civil, MD  Insulin Glargine (LANTUS SOLOSTAR) 100 UNIT/ML SOPN 70 units at bedtime 12/05/12  Yes [name], MD  insulin lispro (HUMALOG) 100 UNIT/ML injection Inject 12 Units into the skin 3 (three) times daily before meals. Sliding scale 11/21/12  Yes [name], MD  losartan (COZAAR) 50 MG tablet Take 1 tablet (50 mg total) by mouth daily. 09/30/13  Yes [name], MD  methylcellulose (ARTIFICIAL TEARS) 1 % ophthalmic solution Place 1 drop into both eyes 2 (two) times daily as needed (dry eyes).   Yes Historical Provider, MD  metoprolol tartrate (LOPRESSOR) 25 MG tablet TAKE ONE TABLET BY  MOUTH TWICE DAILY 06/08/14  Yes [name], MD  Multiple Vitamin (MULTIVITAMIN) tablet Take 1 tablet by mouth daily.     Yes Historical Provider, MD  Omega-3 Fatty Acids (FISH OIL BURP-LESS PO) Take 1 tablet by mouth daily.     Yes Historical Provider, MD  rosuvastatin (CRESTOR) 20 MG tablet Take 1 tablet (20 mg total) by mouth daily. 06/23/13  Yes [name], MD  nitroGLYCERIN (NITROSTAT) 0.4 MG SL tablet Place 0.4  mg under the tongue every 5 (five) minutes as needed for chest pain. 11/01/10 04/23/14  Liliane Shi, PA-C      Allergies  Allergen Reactions  . Ace Inhibitors Cough  . Cephalexin Other (See Comments)    Resistant to antibiotic  . Beta Adrenergic Blockers Cough    ROS:  Out of a complete 14 system review of symptoms, the patient complains only of the following symptoms, and all other reviewed systems are negative.  Swelling in the legs Fatigue Itching Blurred vision, double vision, loss of vision Cough, snoring Blood in the urine, impotence Feeling hot, increased thirst Joint pain, muscle cramps Allergies, runny nose Memory loss, confusion, headache, numbness, weakness, dizziness Depression, anxiety, not enough sleep, decreased energy, racing thoughts Restless legs  Blood pressure 143/84, pulse 64, height 5\' 9"  (1.753 m), weight 264 lb 8 oz (119.976 kg).  Physical Exam  General: The patient is alert and cooperative at the time of the examination. The patient is moderately obese.  Eyes: Pupils are equal, round, and reactive to light. Discs are flat bilaterally.  Neck: The neck is supple, no carotid bruits are noted.  Respiratory: The respiratory examination is clear.  Cardiovascular: The cardiovascular examination reveals a regular rate and rhythm, no obvious murmurs or rubs are noted.  Skin: Extremities are with 2+ edema below the knees bilaterally.  Neurologic Exam  Mental status: The patient is alert and oriented x 3 at the time of the  examination. The patient has apparent normal recent and remote memory, with an apparently normal attention span and concentration ability.  Cranial nerves: Facial symmetry is present. There is good sensation of the face to pinprick and soft touch bilaterally. The strength of the facial muscles and the muscles to head turning and shoulder shrug are normal bilaterally. Speech is well enunciated, no aphasia or dysarthria is noted. Extraocular movements are full. Visual fields are full. The tongue is midline, and the patient has symmetric elevation of the soft palate. No obvious hearing deficits are noted.  Motor: The motor testing reveals 5 over 5 strength of all 4 extremities, with the exception of 4/5 strength of the intrinsic muscles of the hands bilaterally, and mild bilateral foot drops. Atrophy of the first dorsal interossei are seen bilaterally. Good symmetric motor tone is noted throughout.  Sensory: Sensory testing is intact to pinprick, soft touch, vibration sensation, and position sense on the upper extremities. With the lower extremities, there is a stocking pattern pinprick sensory deficit up to the knees bilaterally, significant impairment of position sense and vibration sensation in the feet is noted bilaterally. No evidence of extinction is noted.  Coordination: Cerebellar testing reveals good finger-nose-finger and heel-to-shin bilaterally.  Gait and station: Gait is slightly wide-based, the patient is able to ambulate independently. Tandem gait is unsteady. The patient is unable to walk on heels or the toes bilaterally Romberg is negative. No drift is seen.  Reflexes: Deep tendon reflexes are symmetric, but are depressed to absent bilaterally. Toes are downgoing bilaterally.    Assessment/Plan:  1. Diabetic peripheral neuropathy  2. Gait disturbance  3. Bilateral foot drops  4. Nocturnal leg cramps  The patient is working on gait stability with gait and balance exercises. He  is to continue these exercises. The patient could benefit from bilateral AFO braces for his foot drops. The patient was given a prescription for this. The patient will be placed back on baclofen for the nocturnal leg cramps, he will follow-up in 6 months, sooner  if needed.  Jill Alexanders MD 10/20/2014 10:22 AM  [facility] [address] [place], [place] [address]  Phone [phone] Fax [phone]

## 2014-10-20 NOTE — Patient Instructions (Addendum)
We will restart baclofen at night for the leg cramps, I will give you a prescription for AFO braces for the feet to improve gait stability.   Fall Prevention and Home Safety Falls cause injuries and can affect all age groups. It is possible to use preventive measures to significantly decrease the likelihood of falls. There are many simple measures which can make your home safer and prevent falls. OUTDOORS  Repair cracks and edges of walkways and driveways.  Remove high doorway thresholds.  Trim shrubbery on the main path into your home.  Have good outside lighting.  Clear walkways of tools, rocks, debris, and clutter.  Check that handrails are not broken and are securely fastened. Both sides of steps should have handrails.  Have leaves, snow, and ice cleared regularly.  Use sand or salt on walkways during winter months.  In the garage, clean up grease or oil spills. BATHROOM  Install night lights.  Install grab bars by the toilet and in the tub and shower.  Use non-skid mats or decals in the tub or shower.  Place a plastic non-slip stool in the shower to sit on, if needed.  Keep floors dry and clean up all water on the floor immediately.  Remove soap buildup in the tub or shower on a regular basis.  Secure bath mats with non-slip, double-sided rug tape.  Remove throw rugs and tripping hazards from the floors. BEDROOMS  Install night lights.  Make sure a bedside light is easy to reach.  Do not use oversized bedding.  Keep a telephone by your bedside.  Have a firm chair with side arms to use for getting dressed.  Remove throw rugs and tripping hazards from the floor. KITCHEN  Keep handles on pots and pans turned toward the center of the stove. Use back burners when possible.  Clean up spills quickly and allow time for drying.  Avoid walking on wet floors.  Avoid hot utensils and knives.  Position shelves so they are not too high or low.  Place  commonly used objects within easy reach.  If necessary, use a sturdy step stool with a grab bar when reaching.  Keep electrical cables out of the way.  Do not use floor polish or wax that makes floors slippery. If you must use wax, use non-skid floor wax.  Remove throw rugs and tripping hazards from the floor. STAIRWAYS  Never leave objects on stairs.  Place handrails on both sides of stairways and use them. Fix any loose handrails. Make sure handrails on both sides of the stairways are as long as the stairs.  Check carpeting to make sure it is firmly attached along stairs. Make repairs to worn or loose carpet promptly.  Avoid placing throw rugs at the top or bottom of stairways, or properly secure the rug with carpet tape to prevent slippage. Get rid of throw rugs, if possible.  Have an electrician put in a light switch at the top and bottom of the stairs. OTHER FALL PREVENTION TIPS  Wear low-heel or rubber-soled shoes that are supportive and fit well. Wear closed toe shoes.  When using a stepladder, make sure it is fully opened and both spreaders are firmly locked. Do not climb a closed stepladder.  Add color or contrast paint or tape to grab bars and handrails in your home. Place contrasting color strips on first and last steps.  Learn and use mobility aids as needed. Install an electrical emergency response system.  Turn on lights  to avoid dark areas. Replace light bulbs that burn out immediately. Get light switches that glow.  Arrange furniture to create clear pathways. Keep furniture in the same place.  Firmly attach carpet with non-skid or double-sided tape.  Eliminate uneven floor surfaces.  Select a carpet pattern that does not visually hide the edge of steps.  Be aware of all pets. OTHER HOME SAFETY TIPS  Set the water temperature for 120 F (48.8 C).  Keep emergency numbers on or near the telephone.  Keep smoke detectors on every level of the home and near  sleeping areas. Document Released: 02/10/2002 Document Revised: 08/22/2011 Document Reviewed: 05/12/2011 [facility] Patient Information 2015 [place], [place]. This information is not intended to replace advice given to you by your health care provider. Make sure you discuss any questions you have with your health care provider.

## 2014-10-30 ENCOUNTER — Ambulatory Visit (INDEPENDENT_AMBULATORY_CARE_PROVIDER_SITE_OTHER): Payer: 59 | Admitting: Psychiatry

## 2014-10-30 ENCOUNTER — Encounter (HOSPITAL_COMMUNITY): Payer: Self-pay | Admitting: Psychiatry

## 2014-10-30 VITALS — BP 152/105 | HR 65 | Ht 69.0 in | Wt 260.0 lb

## 2014-10-30 DIAGNOSIS — F329 Major depressive disorder, single episode, unspecified: Secondary | ICD-10-CM

## 2014-10-30 DIAGNOSIS — F411 Generalized anxiety disorder: Secondary | ICD-10-CM

## 2014-10-30 DIAGNOSIS — F418 Other specified anxiety disorders: Secondary | ICD-10-CM

## 2014-10-30 MED ORDER — FLUOXETINE HCL 20 MG PO CAPS: 60.0000 mg | ORAL_CAPSULE | Freq: Every day | ORAL | Status: DC

## 2014-10-30 MED ORDER — BUSPIRONE HCL 5 MG PO TABS: 5.0000 mg | ORAL_TABLET | Freq: Three times a day (TID) | ORAL | Status: DC

## 2014-10-30 MED ORDER — CLONAZEPAM 1 MG PO TABS: 1.0000 mg | ORAL_TABLET | Freq: Three times a day (TID) | ORAL | Status: DC
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
## 2014-11-04 ENCOUNTER — Ambulatory Visit (INDEPENDENT_AMBULATORY_CARE_PROVIDER_SITE_OTHER): Payer: 59 | Admitting: Gastroenterology

## 2014-11-04 ENCOUNTER — Encounter: Payer: Self-pay | Admitting: Gastroenterology

## 2014-11-04 ENCOUNTER — Ambulatory Visit: Payer: Self-pay | Admitting: Internal Medicine

## 2014-11-04 VITALS — BP 130/70 | HR 56 | Ht 69.0 in | Wt 271.0 lb

## 2014-11-04 DIAGNOSIS — I519 Heart disease, unspecified: Secondary | ICD-10-CM | POA: Diagnosis not present

## 2014-11-04 DIAGNOSIS — Z79899 Other long term (current) drug therapy: Secondary | ICD-10-CM

## 2014-11-04 DIAGNOSIS — Z1211 Encounter for screening for malignant neoplasm of colon: Secondary | ICD-10-CM | POA: Diagnosis not present

## 2014-11-04 MED ORDER — OMEPRAZOLE 20 MG PO CPDR: 20.0000 mg | DELAYED_RELEASE_CAPSULE | Freq: Every day | ORAL | Status: DC

## 2014-11-04 NOTE — Progress Notes (Signed)
HPI :  The patient is a 49 year old male referred to [place] in consultation by Dr. Moshe Cipro for colorectal cancer screening. Patient has a history of ventricular fibrillation arrest in 2011 from acute MI/CAD. He underwent coronary artery bypass surgery at that time and has pacemaker/defibrillator in place. He reports no blood in the stools with regular bowel habits he has some occasional bloating of his lower abdomen but otherwise has no abdominal complaints. He's had no weight loss. He reports his mother had colon cancer diagnosed in her 1s to 33s timeframe. Regarding his cardiac status he denies any issues with his pacemaker defibrillator. He denies any exertional chest pain. He reports some intermittent mild shortness of breath with exertion he attributes to deconditioning given he does not exercise. He takes aspirin 325 mg daily for his cardiac history, although also endorses taking Aleve at least 2 times a day for joint pains. Upon questioning he reports that he thinks he had an ulcer in his stomach as a young man many years ago but does not know the details of this. He has no history of GI bleeding that he is aware of. His most recent ejection fraction on echocardiogram was 35% in 2011. He denies any history of  side effects from anesthesia. Recent labs show normal hemoglobin.  Past Medical History  Diagnosis Date  . Cardiac arrest - ventricular fibrillation 08/26/2009  . CAD (coronary artery disease)     a. OOH MI 6/11; presented with acute CHF; hosp course c/b VF arrest with VDRF, etc;   b s/p CABG: L-LAD/Dx, S-OM/dCFX;  c.Nuclear scan 8/12:  Inferior and inf-lat scar with minimal peri-infarct ischemia, EF 53%.; d.LHC 9/12:  LAD 90%, pD1 (small) 70%, prox large Dx 95%, L-LAD and Dx ok, CFX occluded, S-OM and CFX ok, RCA prox to prox/mid 60-70%.  Medical management     . Ischemic cardiomyopathy     echo 10/11:  inf-septal and apical HK, EF 35%, mod LAE  . Diabetic coma with ketoacidosis     Occured  in 1994 where he spent 30 days in a coma . He develpoed infection of his leg and developing pancreatitis during that  time   . DM2 (diabetes mellitus, type 2)   . Cellulitis 2012    spider bite  . HTN (hypertension)   . HLD (hyperlipidemia)   . Chronic systolic heart failure   . Thyroid disease   . Coma   . Headache(784.0)     sinus  . Anxiety     stress  . CKD (chronic kidney disease)   . Neuropathy   . Carpal tunnel syndrome of right wrist   . 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 Abnormality of gait 04/08/2013  . 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 Foot drop, bilateral 04/08/2013  . Obesity   . Anxiety and depression   . Retinal detachment     Bilateral, laser surgery on the right  . Nocturnal leg cramps   . History of degenerative disc disease   . Hypercholesteremia   . Pancreatitis 2008     Past Surgical History  Procedure Laterality Date  . Coronary artery bypass grafting x4  june 29,2011    x 4  . Tonsillectomy  1998  . Appendectomy  mid 1990  . Right sided abdominal cyst removal    . Carpal tunnel with cubital tunnel  02/22/2012    Procedure: CARPAL TUNNEL WITH CUBITAL TUNNEL;  Surgeon: Roseanne Kaufman, MD;  Location: [facility]  OR;  Service: Orthopedics;  Laterality: Right;  Right Carpal Tunnel Release/Right Cubital Tunnel Release and Transposition if Necessary with Flexor Pronator Release  . Cardiac surgery    . Ulnar nerve transposition Right   . Retinal detachment surgery Bilateral   . Pacemaker placement    . Cardiac defibrillator placement     Family History  Problem Relation Age of Onset  . Cancer Maternal Grandmother     unknown ype  . Allergies Mother   . Diabetes Mother   . Hypertension Mother   . Hyperlipidemia Mother   . Dementia Mother   . Stroke Father   . Allergies Father   . Dementia Father   . Alcohol abuse Father   . Diabetes Sister   . Heart disease Sister   . Anxiety disorder Sister   . Colon cancer Mother   . Colon polyps  Sister   . Schizophrenia Paternal Uncle   . Schizophrenia Cousin   . Pancreatic cancer Sister   . Lung cancer Mother   . Kidney cancer Mother   . Colon polyps Father   . Colon polyps Mother    Social History  Substance Use Topics  . Smoking status: Never Smoker   . Smokeless tobacco: Current User    Types: Snuff  . Alcohol Use: 0.0 oz/week    0 Standard drinks or equivalent per week     Comment: rarely   Current Outpatient Prescriptions  Medication Sig Dispense Refill  . aspirin 325 MG tablet Take 325 mg by mouth daily.      . baclofen (LIORESAL) 10 MG tablet Take 1 tablet (10 mg total) by mouth at bedtime. 30 each 3  . busPIRone (BUSPAR) 5 MG tablet Take 1 tablet (5 mg total) by mouth 3 (three) times daily. 90 tablet 2  . Chromium Picolinate 200 MCG CAPS Take 200 mcg by mouth daily.     . clonazePAM (KLONOPIN) 1 MG tablet Take 1 tablet (1 mg total) by mouth 3 (three) times daily. 90 tablet 2  . ezetimibe (ZETIA) 10 MG tablet Take 1 tablet (10 mg total) by mouth daily. 90 tablet 3  . FLUoxetine (PROZAC) 20 MG capsule Take 3 capsules (60 mg total) by mouth daily. 90 capsule 2  . furosemide (LASIX) 40 MG tablet TAKE ONE TABLET BY MOUTH ONCE DAILY 90 tablet 2  . gabapentin (NEURONTIN) 300 MG capsule Take 1 capsule (300 mg total) by mouth at bedtime. 90 capsule 5  . Insulin Glargine (LANTUS SOLOSTAR) 100 UNIT/ML SOPN 70 units at bedtime 21 pen 3  . insulin lispro (HUMALOG) 100 UNIT/ML injection Inject 12 Units into the skin 3 (three) times daily before meals. Sliding scale    . losartan (COZAAR) 50 MG tablet Take 1 tablet (50 mg total) by mouth daily. 30 tablet 5  . methylcellulose (ARTIFICIAL TEARS) 1 % ophthalmic solution Place 1 drop into both eyes 2 (two) times daily as needed (dry eyes).    . metoprolol tartrate (LOPRESSOR) 25 MG tablet TAKE ONE TABLET BY MOUTH TWICE DAILY 180 tablet 1  . Multiple Vitamin (MULTIVITAMIN) tablet Take 1 tablet by mouth daily.      . Omega-3 Fatty  Acids (FISH OIL BURP-LESS PO) Take 1 tablet by mouth daily.      . rosuvastatin (CRESTOR) 20 MG tablet Take 1 tablet (20 mg total) by mouth daily. 90 tablet 3  . nitroGLYCERIN (NITROSTAT) 0.4 MG SL tablet Place 0.4 mg under the tongue every 5 (five) minutes as needed  for chest pain.    Marland Kitchen omeprazole (PRILOSEC) 20 MG capsule Take 1 capsule (20 mg total) by mouth daily. 90 capsule 1   No current facility-administered medications for this visit.   Allergies  Allergen Reactions  . Ace Inhibitors Cough  . Cephalexin Other (See Comments)    Resistant to antibiotic  . Beta Adrenergic Blockers Cough     Review of Systems: All systems reviewed and negative except where noted in HPI.    Recent labs reviewed in Epic, normal Hgb. Physical Exam: BP 130/70 mmHg  Pulse 56  Ht 5\' 9"  (1.753 m)  Wt 271 lb (122.925 kg)  BMI 40.00 kg/m2 Constitutional: Pleasant,well-developed, male in no acute distress. HEENT: Normocephalic and atraumatic. Conjunctivae are normal. No scleral icterus. Neck supple.  Cardiovascular: Normal rate, regular rhythm.  Pulmonary/chest: Effort normal and breath sounds normal. No wheezing, rales or rhonchi. Abdominal: Soft, protuberant,  nondistended, nontender. Bowel sounds active throughout. There are no masses palpable. No hepatomegaly. Extremities: (+) 1 edema bilaterally Neurological: Alert and oriented to person place and time. Skin: Skin is warm and dry. No rashes noted. Psychiatric: Normal mood and affect. Behavior is normal.   ASSESSMENT AND PLAN: 49 year old male with a significant cardiac history as outlined above as well as a strong family history of colon cancer (mother) presenting for his first time colon cancer screening. Patient has a pacemaker and defibrillator that was placed in 2011 but he denies any recent issues with this and his last ejection fraction is 35%. He denies any new or baseline cardiopulmonary symptoms that bother him. I discussed the risks  and benefits of optical colonoscopy as outlined below. Given his family history of colon cancer and that he is overdue for a screening exam, I think an optical colonoscopy is the best modality for his screening at this time. We will discuss his case with our anesthesia providers to determine if they wish to perform his case at our endoscopy center or if his case needs to be done at the hospital. Regarding his aspirin use, I think it safe to continue regular dose of aspirin at this time as this should not significantly increase his risk of bleeding should a polyp need to be removed and he has a clear need for it otherwise.  Otherwise, the patient endorses taking over-the-counter Aleve twice daily in addition to his regular aspirin for joint pains. He also endorses a questionable history of peptic ulcer disease remotely for which we don't have records of. I discussed the risks of long-term NSAID use to include GI bleeding and kidney disease. I would recommend he avoid routine NSAID use due to these issues however he is limited by significant joint pains requiring the use of a cane to walk. In this light if he continues to use NSAIDs in addition to his aspirin I'm recommending prophylactic omeprazole 20 mg once a day to prevent NSAID related GI bleeding. If he takes omeprazole routinely he will need to have his renal function checked periodically, it appears stable on review of labs. He verbalized understanding of this issue and agreed.  The indications, risks, and benefits of colonoscopy were otherwise explained to the patient in detail. Risks include but not limited to bleeding, perforation, adverse reaction to medications, cardiopulmonary compromise. Sequelae include but not limited to need for surgery, hospitalization, blood transfusion, disability, morbidity, and mortality was explained. Patient verbalized understanding and wished to proceed. All questions answered, referred to scheduler and bowel prep  provided. Further recommendations pending results of  the exam.   [place] Cellar, MD [facility]

## 2014-11-04 NOTE — Patient Instructions (Addendum)
We have sent medications to your pharmacy for you to pick up at your convenience.  You have been scheduled for a colonoscopy. Please follow written instructions given to you at your visit today.  Please pick up your prep supplies at the pharmacy within the next 1-3 days. If you use inhalers (even only as needed), please bring them with you on the day of your procedure.   

## 2014-11-06 ENCOUNTER — Encounter (HOSPITAL_COMMUNITY): Payer: Self-pay | Admitting: *Deleted

## 2014-11-11 ENCOUNTER — Other Ambulatory Visit: Payer: Self-pay

## 2014-11-11 MED ORDER — NA SULFATE-K SULFATE-MG SULF 17.5-3.13-1.6 GM/177ML PO SOLN: ORAL | Status: DC

## 2014-11-13 ENCOUNTER — Other Ambulatory Visit: Payer: Self-pay

## 2014-11-13 DIAGNOSIS — Z79899 Other long term (current) drug therapy: Secondary | ICD-10-CM

## 2014-11-13 DIAGNOSIS — Z1211 Encounter for screening for malignant neoplasm of colon: Secondary | ICD-10-CM

## 2014-11-17 ENCOUNTER — Ambulatory Visit (HOSPITAL_COMMUNITY): Payer: 59 | Admitting: Anesthesiology

## 2014-11-17 ENCOUNTER — Ambulatory Visit (HOSPITAL_COMMUNITY)
Admission: RE | Admit: 2014-11-17 | Discharge: 2014-11-17 | Disposition: A | Discharge status: Home / Self Care | Payer: 59 | Source: Ambulatory Visit | Attending: Gastroenterology | Admitting: Gastroenterology

## 2014-11-17 ENCOUNTER — Encounter (HOSPITAL_COMMUNITY): Payer: Self-pay

## 2014-11-17 ENCOUNTER — Encounter (HOSPITAL_COMMUNITY)
Admission: RE | Disposition: A | Discharge status: Home / Self Care | Payer: Self-pay | Source: Ambulatory Visit | Attending: Gastroenterology

## 2014-11-17 DIAGNOSIS — Z791 Long term (current) use of non-steroidal anti-inflammatories (NSAID): Secondary | ICD-10-CM | POA: Insufficient documentation

## 2014-11-17 DIAGNOSIS — I255 Ischemic cardiomyopathy: Secondary | ICD-10-CM | POA: Diagnosis not present

## 2014-11-17 DIAGNOSIS — E079 Disorder of thyroid, unspecified: Secondary | ICD-10-CM | POA: Diagnosis not present

## 2014-11-17 DIAGNOSIS — D125 Benign neoplasm of sigmoid colon: Secondary | ICD-10-CM | POA: Insufficient documentation

## 2014-11-17 DIAGNOSIS — Z951 Presence of aortocoronary bypass graft: Secondary | ICD-10-CM | POA: Diagnosis not present

## 2014-11-17 DIAGNOSIS — I252 Old myocardial infarction: Secondary | ICD-10-CM | POA: Insufficient documentation

## 2014-11-17 DIAGNOSIS — I472 Ventricular tachycardia: Secondary | ICD-10-CM | POA: Diagnosis not present

## 2014-11-17 DIAGNOSIS — I4901 Ventricular fibrillation: Secondary | ICD-10-CM | POA: Diagnosis not present

## 2014-11-17 DIAGNOSIS — F419 Anxiety disorder, unspecified: Secondary | ICD-10-CM | POA: Insufficient documentation

## 2014-11-17 DIAGNOSIS — I5022 Chronic systolic (congestive) heart failure: Secondary | ICD-10-CM | POA: Diagnosis not present

## 2014-11-17 DIAGNOSIS — I2510 Atherosclerotic heart disease of native coronary artery without angina pectoris: Secondary | ICD-10-CM | POA: Diagnosis not present

## 2014-11-17 DIAGNOSIS — E785 Hyperlipidemia, unspecified: Secondary | ICD-10-CM | POA: Insufficient documentation

## 2014-11-17 DIAGNOSIS — E780 Pure hypercholesterolemia: Secondary | ICD-10-CM | POA: Insufficient documentation

## 2014-11-17 DIAGNOSIS — G4730 Sleep apnea, unspecified: Secondary | ICD-10-CM | POA: Diagnosis not present

## 2014-11-17 DIAGNOSIS — Z794 Long term (current) use of insulin: Secondary | ICD-10-CM | POA: Insufficient documentation

## 2014-11-17 DIAGNOSIS — Z1211 Encounter for screening for malignant neoplasm of colon: Secondary | ICD-10-CM

## 2014-11-17 DIAGNOSIS — Z79899 Other long term (current) drug therapy: Secondary | ICD-10-CM | POA: Diagnosis not present

## 2014-11-17 DIAGNOSIS — F329 Major depressive disorder, single episode, unspecified: Secondary | ICD-10-CM | POA: Insufficient documentation

## 2014-11-17 DIAGNOSIS — Z95810 Presence of automatic (implantable) cardiac defibrillator: Secondary | ICD-10-CM | POA: Diagnosis not present

## 2014-11-17 DIAGNOSIS — I129 Hypertensive chronic kidney disease with stage 1 through stage 4 chronic kidney disease, or unspecified chronic kidney disease: Secondary | ICD-10-CM | POA: Insufficient documentation

## 2014-11-17 DIAGNOSIS — N189 Chronic kidney disease, unspecified: Secondary | ICD-10-CM | POA: Insufficient documentation

## 2014-11-17 DIAGNOSIS — E1142 Type 2 diabetes mellitus with diabetic polyneuropathy: Secondary | ICD-10-CM | POA: Insufficient documentation

## 2014-11-17 DIAGNOSIS — E1140 Type 2 diabetes mellitus with diabetic neuropathy, unspecified: Secondary | ICD-10-CM | POA: Diagnosis not present

## 2014-11-17 DIAGNOSIS — Z800 Family history of malignant neoplasm of digestive organs: Secondary | ICD-10-CM | POA: Diagnosis not present

## 2014-11-17 DIAGNOSIS — Z6841 Body Mass Index (BMI) 40.0 and over, adult: Secondary | ICD-10-CM | POA: Diagnosis not present

## 2014-11-17 DIAGNOSIS — Z7982 Long term (current) use of aspirin: Secondary | ICD-10-CM | POA: Diagnosis not present

## 2014-11-17 HISTORY — PX: COLONOSCOPY WITH PROPOFOL: SHX5780

## 2014-11-17 HISTORY — DX: Presence of automatic (implantable) cardiac defibrillator: Z95.810

## 2014-11-17 HISTORY — DX: Cardiac arrhythmia, unspecified: I49.9

## 2014-11-17 HISTORY — DX: Presence of cardiac pacemaker: Z95.0

## 2014-11-17 LAB — GLUCOSE, CAPILLARY
GLUCOSE-CAPILLARY: 53 mg/dL — AB (ref 65–99)
GLUCOSE-CAPILLARY: 94 mg/dL (ref 65–99)
GLUCOSE-CAPILLARY: 71 mg/dL (ref 65–99)

## 2014-11-17 SURGERY — COLONOSCOPY WITH PROPOFOL
Anesthesia: Monitor Anesthesia Care

## 2014-11-17 MED ORDER — PROPOFOL 10 MG/ML IV BOLUS
INTRAVENOUS | Status: DC | PRN
  Administered 2014-11-17 (×2): 50 mg via INTRAVENOUS

## 2014-11-17 MED ORDER — SODIUM CHLORIDE 0.9 % IV SOLN
INTRAVENOUS | Status: DC
  Administered 2014-11-17: 13:00:00 via INTRAVENOUS

## 2014-11-17 MED ORDER — DEXTROSE 50 % IV SOLN
25.0000 mL | Freq: Once | INTRAVENOUS | Status: AC
  Administered 2014-11-17: 25 mL via INTRAVENOUS

## 2014-11-17 MED ORDER — PROPOFOL 10 MG/ML IV BOLUS
INTRAVENOUS | Status: AC
  Filled 2014-11-17: qty 20

## 2014-11-17 MED ORDER — PROPOFOL INFUSION 10 MG/ML OPTIME
INTRAVENOUS | Status: DC | PRN
  Administered 2014-11-17: 125 ug/kg/min via INTRAVENOUS

## 2014-11-17 MED ORDER — LIDOCAINE HCL (CARDIAC) 20 MG/ML IV SOLN
INTRAVENOUS | Status: AC
  Filled 2014-11-17: qty 5

## 2014-11-17 MED ORDER — DEXTROSE 50 % IV SOLN
INTRAVENOUS | Status: AC
  Filled 2014-11-17: qty 50

## 2014-11-17 MED ORDER — PROPOFOL 10 MG/ML IV BOLUS
INTRAVENOUS | Status: AC
Start: 2014-11-17 — End: 2014-11-17
  Filled 2014-11-17: qty 20

## 2014-11-17 MED ORDER — INSULIN ASPART 100 UNIT/ML ~~LOC~~ SOLN
0.0000 [IU] | SUBCUTANEOUS | Status: DC
  Filled 2014-11-17: qty 0.15

## 2014-11-17 MED ORDER — NITROGLYCERIN 0.4 MG SL SUBL: 0.4000 mg | SUBLINGUAL_TABLET | SUBLINGUAL | Status: DC | PRN

## 2014-11-17 MED ORDER — LIDOCAINE HCL (CARDIAC) 20 MG/ML IV SOLN
INTRAVENOUS | Status: DC | PRN
  Administered 2014-11-17: 50 mg via INTRAVENOUS

## 2014-11-17 NOTE — Op Note (Signed)
[facility]  [place], [address]   COLONOSCOPY PROCEDURE REPORT  PATIENT: Matthew [name], Matthew [name]  MR#: [id number] BIRTHDATE: [birth year] , 48  yrs. old GENDER: male ENDOSCOPIST: Culebra Cellar, MD REFERRED BY: PROCEDURE DATE:  11/17/2014 PROCEDURE:   Colonoscopy with biopsy First Screening Colonoscopy - Avg.  risk and is 50 yrs.  old or older Yes.  Prior Negative Screening - Now for repeat screening. N/A  History of Adenoma - Now for follow-up colonoscopy & has been > or = to 3 yrs.  N/A  Polyps removed today? Yes ASA CLASS:   Class III INDICATIONS:48 yo male here for first time CRC screening. FH of CRC.  MEDICATIONS: Per Anesthesia  DESCRIPTION OF PROCEDURE:   After the risks benefits and alternatives of the procedure were thoroughly explained, informed consent was obtained.  The digital rectal exam revealed no abnormalities of the rectum.   The EC-[id number] ([id number])  endoscope was introduced through the anus and advanced to the cecum, which was identified by both the appendix and ileocecal valve. No adverse events experienced.   The quality of the prep was poor.  The instrument was then slowly withdrawn as the colon was fully examined. Estimated blood loss is zero unless otherwise noted in this procedure report.      COLON FINDINGS: A sessile polyp measuring 3 mm in size was found in the sigmoid colon.  A polypectomy was performed with cold forceps. The resection was complete, the polyp tissue was completely retrieved and sent to histology.   The preparation of the colon was poor throughout the entire colon.  Time was spent lavaging the colon, however stool continued to clog the endoscope and adequate views could not be obtained.  While the preparation was inadequate for screening purposes, no large mass lesions were noted. Retroflexed views revealed no abnormalities. The time to cecum = 6.0 Withdrawal time = 10.5   The scope was withdrawn and  the procedure completed.  COMPLICATIONS: There were no immediate complications.  ENDOSCOPIC IMPRESSION: Sessile polyp was found in the sigmoid colon; polypectomy was performed with cold forceps Poor bowel preparation, inadequate for screening purposes  RECOMMENDATIONS: Resume diet Resume medications Await pathology results Repeat colonoscopy in the next few months using different bowel preparation given the poor prep noted on today's exam, as this exam was inadequate for screening purposes.  eSigned:  Caribou Cellar, MD 11/17/2014 1:43 PM   cc: the patient   PATIENT NAME:  Matthew [name], Matthew [name] MR#: [id number]

## 2014-11-17 NOTE — H&P (View-Only) (Signed)
HPI :  The patient is a 49 year old male referred to [place] in consultation by Dr. Moshe Cipro for colorectal cancer screening. Patient has a history of ventricular fibrillation arrest in 2011 from acute MI/CAD. He underwent coronary artery bypass surgery at that time and has pacemaker/defibrillator in place. He reports no blood in the stools with regular bowel habits he has some occasional bloating of his lower abdomen but otherwise has no abdominal complaints. He's had no weight loss. He reports his mother had colon cancer diagnosed in her 78s to 74s timeframe. Regarding his cardiac status he denies any issues with his pacemaker defibrillator. He denies any exertional chest pain. He reports some intermittent mild shortness of breath with exertion he attributes to deconditioning given he does not exercise. He takes aspirin 325 mg daily for his cardiac history, although also endorses taking Aleve at least 2 times a day for joint pains. Upon questioning he reports that he thinks he had an ulcer in his stomach as a young man many years ago but does not know the details of this. He has no history of GI bleeding that he is aware of. His most recent ejection fraction on echocardiogram was 35% in 2011. He denies any history of  side effects from anesthesia. Recent labs show normal hemoglobin.  Past Medical History  Diagnosis Date  . Cardiac arrest - ventricular fibrillation 08/26/2009  . CAD (coronary artery disease)     a. OOH MI 6/11; presented with acute CHF; hosp course c/b VF arrest with VDRF, etc;   b s/p CABG: L-LAD/Dx, S-OM/dCFX;  c.Nuclear scan 8/12:  Inferior and inf-lat scar with minimal peri-infarct ischemia, EF 53%.; d.LHC 9/12:  LAD 90%, pD1 (small) 70%, prox large Dx 95%, L-LAD and Dx ok, CFX occluded, S-OM and CFX ok, RCA prox to prox/mid 60-70%.  Medical management     . Ischemic cardiomyopathy     echo 10/11:  inf-septal and apical HK, EF 35%, mod LAE  . Diabetic coma with ketoacidosis     Occured  in 1994 where he spent 30 days in a coma . He develpoed infection of his leg and developing pancreatitis during that  time   . DM2 (diabetes mellitus, type 2)   . Cellulitis 2012    spider bite  . HTN (hypertension)   . HLD (hyperlipidemia)   . Chronic systolic heart failure   . Thyroid disease   . Coma   . Headache(784.0)     sinus  . Anxiety     stress  . CKD (chronic kidney disease)   . Neuropathy   . Carpal tunnel syndrome of right wrist   . 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 Abnormality of gait 04/08/2013  . 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 Foot drop, bilateral 04/08/2013  . Obesity   . Anxiety and depression   . Retinal detachment     Bilateral, laser surgery on the right  . Nocturnal leg cramps   . History of degenerative disc disease   . Hypercholesteremia   . Pancreatitis 2008     Past Surgical History  Procedure Laterality Date  . Coronary artery bypass grafting x4  june 29,2011    x 4  . Tonsillectomy  1998  . Appendectomy  mid 1990  . Right sided abdominal cyst removal    . Carpal tunnel with cubital tunnel  02/22/2012    Procedure: CARPAL TUNNEL WITH CUBITAL TUNNEL;  Surgeon: Roseanne Kaufman, MD;  Location: [facility]  OR;  Service: Orthopedics;  Laterality: Right;  Right Carpal Tunnel Release/Right Cubital Tunnel Release and Transposition if Necessary with Flexor Pronator Release  . Cardiac surgery    . Ulnar nerve transposition Right   . Retinal detachment surgery Bilateral   . Pacemaker placement    . Cardiac defibrillator placement     Family History  Problem Relation Age of Onset  . Cancer Maternal Grandmother     unknown ype  . Allergies Mother   . Diabetes Mother   . Hypertension Mother   . Hyperlipidemia Mother   . Dementia Mother   . Stroke Father   . Allergies Father   . Dementia Father   . Alcohol abuse Father   . Diabetes Sister   . Heart disease Sister   . Anxiety disorder Sister   . Colon cancer Mother   . Colon polyps  Sister   . Schizophrenia Paternal Uncle   . Schizophrenia Cousin   . Pancreatic cancer Sister   . Lung cancer Mother   . Kidney cancer Mother   . Colon polyps Father   . Colon polyps Mother    Social History  Substance Use Topics  . Smoking status: Never Smoker   . Smokeless tobacco: Current User    Types: Snuff  . Alcohol Use: 0.0 oz/week    0 Standard drinks or equivalent per week     Comment: rarely   Current Outpatient Prescriptions  Medication Sig Dispense Refill  . aspirin 325 MG tablet Take 325 mg by mouth daily.      . baclofen (LIORESAL) 10 MG tablet Take 1 tablet (10 mg total) by mouth at bedtime. 30 each 3  . busPIRone (BUSPAR) 5 MG tablet Take 1 tablet (5 mg total) by mouth 3 (three) times daily. 90 tablet 2  . Chromium Picolinate 200 MCG CAPS Take 200 mcg by mouth daily.     . clonazePAM (KLONOPIN) 1 MG tablet Take 1 tablet (1 mg total) by mouth 3 (three) times daily. 90 tablet 2  . ezetimibe (ZETIA) 10 MG tablet Take 1 tablet (10 mg total) by mouth daily. 90 tablet 3  . FLUoxetine (PROZAC) 20 MG capsule Take 3 capsules (60 mg total) by mouth daily. 90 capsule 2  . furosemide (LASIX) 40 MG tablet TAKE ONE TABLET BY MOUTH ONCE DAILY 90 tablet 2  . gabapentin (NEURONTIN) 300 MG capsule Take 1 capsule (300 mg total) by mouth at bedtime. 90 capsule 5  . Insulin Glargine (LANTUS SOLOSTAR) 100 UNIT/ML SOPN 70 units at bedtime 21 pen 3  . insulin lispro (HUMALOG) 100 UNIT/ML injection Inject 12 Units into the skin 3 (three) times daily before meals. Sliding scale    . losartan (COZAAR) 50 MG tablet Take 1 tablet (50 mg total) by mouth daily. 30 tablet 5  . methylcellulose (ARTIFICIAL TEARS) 1 % ophthalmic solution Place 1 drop into both eyes 2 (two) times daily as needed (dry eyes).    . metoprolol tartrate (LOPRESSOR) 25 MG tablet TAKE ONE TABLET BY MOUTH TWICE DAILY 180 tablet 1  . Multiple Vitamin (MULTIVITAMIN) tablet Take 1 tablet by mouth daily.      . Omega-3 Fatty  Acids (FISH OIL BURP-LESS PO) Take 1 tablet by mouth daily.      . rosuvastatin (CRESTOR) 20 MG tablet Take 1 tablet (20 mg total) by mouth daily. 90 tablet 3  . nitroGLYCERIN (NITROSTAT) 0.4 MG SL tablet Place 0.4 mg under the tongue every 5 (five) minutes as needed  for chest pain.    Marland Kitchen omeprazole (PRILOSEC) 20 MG capsule Take 1 capsule (20 mg total) by mouth daily. 90 capsule 1   No current facility-administered medications for this visit.   Allergies  Allergen Reactions  . Ace Inhibitors Cough  . Cephalexin Other (See Comments)    Resistant to antibiotic  . Beta Adrenergic Blockers Cough     Review of Systems: All systems reviewed and negative except where noted in HPI.    Recent labs reviewed in Epic, normal Hgb. Physical Exam: BP 130/70 mmHg  Pulse 56  Ht 5\' 9"  (1.753 m)  Wt 271 lb (122.925 kg)  BMI 40.00 kg/m2 Constitutional: Pleasant,well-developed, male in no acute distress. HEENT: Normocephalic and atraumatic. Conjunctivae are normal. No scleral icterus. Neck supple.  Cardiovascular: Normal rate, regular rhythm.  Pulmonary/chest: Effort normal and breath sounds normal. No wheezing, rales or rhonchi. Abdominal: Soft, protuberant,  nondistended, nontender. Bowel sounds active throughout. There are no masses palpable. No hepatomegaly. Extremities: (+) 1 edema bilaterally Neurological: Alert and oriented to person place and time. Skin: Skin is warm and dry. No rashes noted. Psychiatric: Normal mood and affect. Behavior is normal.   ASSESSMENT AND PLAN: 49 year old male with a significant cardiac history as outlined above as well as a strong family history of colon cancer (mother) presenting for his first time colon cancer screening. Patient has a pacemaker and defibrillator that was placed in 2011 but he denies any recent issues with this and his last ejection fraction is 35%. He denies any new or baseline cardiopulmonary symptoms that bother him. I discussed the risks  and benefits of optical colonoscopy as outlined below. Given his family history of colon cancer and that he is overdue for a screening exam, I think an optical colonoscopy is the best modality for his screening at this time. We will discuss his case with our anesthesia providers to determine if they wish to perform his case at our endoscopy center or if his case needs to be done at the hospital. Regarding his aspirin use, I think it safe to continue regular dose of aspirin at this time as this should not significantly increase his risk of bleeding should a polyp need to be removed and he has a clear need for it otherwise.  Otherwise, the patient endorses taking over-the-counter Aleve twice daily in addition to his regular aspirin for joint pains. He also endorses a questionable history of peptic ulcer disease remotely for which we don't have records of. I discussed the risks of long-term NSAID use to include GI bleeding and kidney disease. I would recommend he avoid routine NSAID use due to these issues however he is limited by significant joint pains requiring the use of a cane to walk. In this light if he continues to use NSAIDs in addition to his aspirin I'm recommending prophylactic omeprazole 20 mg once a day to prevent NSAID related GI bleeding. If he takes omeprazole routinely he will need to have his renal function checked periodically, it appears stable on review of labs. He verbalized understanding of this issue and agreed.  The indications, risks, and benefits of colonoscopy were otherwise explained to the patient in detail. Risks include but not limited to bleeding, perforation, adverse reaction to medications, cardiopulmonary compromise. Sequelae include but not limited to need for surgery, hospitalization, blood transfusion, disability, morbidity, and mortality was explained. Patient verbalized understanding and wished to proceed. All questions answered, referred to scheduler and bowel prep  provided. Further recommendations pending results of  the exam.   [place] Cellar, MD [facility]

## 2014-11-17 NOTE — Anesthesia Preprocedure Evaluation (Addendum)
Anesthesia Evaluation  Patient identified by MRN, date of birth, ID band Patient awake    Reviewed: Allergy & Precautions, H&P , NPO status , Patient's Chart, lab work & pertinent test results, reviewed documented beta blocker date and time   Airway Mallampati: III  TM Distance: >3 FB Neck ROM: full    Dental  (+) Dental Advisory Given, Caps, Partial Upper, Poor Dentition, Missing All upper front are permanent partial.  Missing front lateral lower:   Pulmonary sleep apnea ,    Pulmonary exam normal breath sounds clear to auscultation       Cardiovascular Exercise Tolerance: Good hypertension, Pt. on medications and Pt. on home beta blockers + CAD, + CABG and +CHF  Normal cardiovascular exam+ dysrhythmias Ventricular Tachycardia and Ventricular Fibrillation + pacemaker + Cardiac Defibrillator  Rhythm:regular Rate:Normal  LAFB. Chronic systolic heart failure but good EF   Neuro/Psych Anxiety Panic disordernegative neurological ROS  negative psych ROS   GI/Hepatic negative GI ROS, Neg liver ROS,   Endo/Other  diabetes, Well Controlled, Type 2, Insulin DependentMorbid obesity  Renal/GU negative Renal ROS  negative genitourinary   Musculoskeletal   Abdominal (+) + obese,   Peds  Hematology negative hematology ROS (+)   Anesthesia Other Findings   Reproductive/Obstetrics negative OB ROS                           Anesthesia Physical Anesthesia Plan  ASA: IV  Anesthesia Plan: MAC   Post-op Pain Management:    Induction:   Airway Management Planned:   Additional Equipment:   Intra-op Plan:   Post-operative Plan:   Informed Consent: I have reviewed the patients History and Physical, chart, labs and discussed the procedure including the risks, benefits and alternatives for the proposed anesthesia with the patient or authorized representative who has indicated his/her understanding and  acceptance.   Dental Advisory Given  Plan Discussed with: CRNA and Surgeon  Anesthesia Plan Comments:         Anesthesia Quick Evaluation

## 2014-11-17 NOTE — Transfer of Care (Signed)
Immediate Anesthesia Transfer of Care Note  Patient: Matthew [name]  Procedure(s) Performed: Procedure(s): COLONOSCOPY WITH PROPOFOL (N/A)  Patient Location: PACU  Anesthesia Type:MAC  Level of Consciousness: awake, alert  and oriented  Airway & Oxygen Therapy: Patient Spontanous Breathing and Patient connected to face mask oxygen  Post-op Assessment: Report given to RN and Post -op Vital signs reviewed and stable  Post vital signs: Reviewed and stable  Last Vitals:  Filed Vitals:   11/17/14 1205  BP: 149/79  Pulse: 67  Temp: 36.6 C  Resp: 18    Complications: No apparent anesthesia complications

## 2014-11-17 NOTE — Discharge Instructions (Signed)

## 2014-11-17 NOTE — Anesthesia Postprocedure Evaluation (Signed)
  Anesthesia Post-op Note  Patient: Matthew [name]  Procedure(s) Performed: Procedure(s) (LRB): COLONOSCOPY WITH PROPOFOL (N/A)  Patient Location: PACU  Anesthesia Type: MAC  Level of Consciousness: awake and alert   Airway and Oxygen Therapy: Patient Spontanous Breathing  Post-op Pain: mild  Post-op Assessment: Post-op Vital signs reviewed, Patient's Cardiovascular Status Stable, Respiratory Function Stable, Patent Airway and No signs of Nausea or vomiting  Last Vitals:  Filed Vitals:   11/17/14 1410  BP: 171/78  Pulse: 59  Temp:   Resp: 13    Post-op Vital Signs: stable   Complications: No apparent anesthesia complications

## 2014-11-17 NOTE — Interval H&P Note (Signed)
History and Physical Interval Note:  11/17/2014 1:06 PM  Matthew [name]  has presented today for surgery, with the diagnosis of screening  The various methods of treatment have been discussed with the patient and family. After consideration of risks, benefits and other options for treatment, the patient has consented to  Procedure(s): COLONOSCOPY WITH PROPOFOL (N/A) as a surgical intervention .  The patient's history has been reviewed, patient examined, no change in status, stable for surgery.  I have reviewed the patient's chart and labs.  Questions were answered to the patient's satisfaction.     [name]

## 2014-11-18 ENCOUNTER — Encounter: Payer: Self-pay | Admitting: Internal Medicine

## 2014-11-18 ENCOUNTER — Encounter (HOSPITAL_COMMUNITY): Payer: Self-pay | Admitting: Gastroenterology

## 2014-11-18 ENCOUNTER — Ambulatory Visit (INDEPENDENT_AMBULATORY_CARE_PROVIDER_SITE_OTHER): Payer: 59 | Admitting: Internal Medicine

## 2014-11-18 VITALS — HR 61 | Ht 69.0 in | Wt 276.2 lb

## 2014-11-18 DIAGNOSIS — Z95810 Presence of automatic (implantable) cardiac defibrillator: Secondary | ICD-10-CM | POA: Diagnosis not present

## 2014-11-18 DIAGNOSIS — I472 Ventricular tachycardia: Secondary | ICD-10-CM

## 2014-11-18 DIAGNOSIS — I4729 Other ventricular tachycardia: Secondary | ICD-10-CM

## 2014-11-18 DIAGNOSIS — I5022 Chronic systolic (congestive) heart failure: Secondary | ICD-10-CM

## 2014-11-18 LAB — CUP PACEART INCLINIC DEVICE CHECK
Brady Statistic RA Percent Paced: 1 % — CL
Date Time Interrogation Session: 20160914040000
HIGH POWER IMPEDANCE MEASURED VALUE: 28 Ohm
HIGH POWER IMPEDANCE MEASURED VALUE: 43 Ohm
Lead Channel Impedance Value: 432 Ohm
Lead Channel Pacing Threshold Amplitude: 1 V
Lead Channel Pacing Threshold Pulse Width: 0.4 ms
Lead Channel Setting Pacing Pulse Width: 0.4 ms
MDC IDC MSMT LEADCHNL RA PACING THRESHOLD AMPLITUDE: 0.5 V
MDC IDC MSMT LEADCHNL RA SENSING INTR AMPL: 1.3 mV
MDC IDC MSMT LEADCHNL RV IMPEDANCE VALUE: 414 Ohm
MDC IDC MSMT LEADCHNL RV PACING THRESHOLD PULSEWIDTH: 0.4 ms
MDC IDC MSMT LEADCHNL RV SENSING INTR AMPL: 9.7 mV
MDC IDC PG SERIAL: 168307
MDC IDC SET LEADCHNL RA PACING AMPLITUDE: 2 V
MDC IDC SET LEADCHNL RV PACING AMPLITUDE: 2.4 V
MDC IDC SET LEADCHNL RV SENSING SENSITIVITY: 0.5 mV
MDC IDC SET ZONE DETECTION INTERVAL: 333 ms
MDC IDC STAT BRADY RV PERCENT PACED: 1 % — AB
Zone Setting Detection Interval: 286 ms

## 2014-11-18 NOTE — Assessment & Plan Note (Signed)
His VT has been quiet. He will continue his current meds.

## 2014-11-18 NOTE — Progress Notes (Signed)
HPI Mr. Ruffolo returns today for followup. He is a pleasant 49 yo man with a h/o an ICM, chronic systolic heart failure, nonsustained ventricular tachycardia, morbid obesity, and diabetes. In the interim, he continues to experience class II heart failure. No recent ICD shocks and no syncope. He does get dyspnea with exertion and has gained weight over the past week.  Allergies  Allergen Reactions  . Ace Inhibitors Cough  . Cephalexin Other (See Comments)    Resistant to antibiotic  . Beta Adrenergic Blockers Cough     Current Outpatient Prescriptions  Medication Sig Dispense Refill  . aspirin 325 MG tablet Take 325 mg by mouth daily.      . baclofen (LIORESAL) 10 MG tablet Take 1 tablet (10 mg total) by mouth at bedtime. 30 each 3  . busPIRone (BUSPAR) 5 MG tablet Take 1 tablet (5 mg total) by mouth 3 (three) times daily. 90 tablet 2  . Chromium Picolinate 200 MCG CAPS Take 200 mcg by mouth daily.     . clonazePAM (KLONOPIN) 1 MG tablet Take 1 tablet (1 mg total) by mouth 3 (three) times daily. 90 tablet 2  . ezetimibe (ZETIA) 10 MG tablet Take 10 mg by mouth daily.    Marland Kitchen FLUoxetine (PROZAC) 20 MG capsule Take 60 mg by mouth daily.    . furosemide (LASIX) 40 MG tablet Take 40 mg by mouth daily.    Marland Kitchen gabapentin (NEURONTIN) 300 MG capsule Take 1 capsule (300 mg total) by mouth at bedtime. 90 capsule 5  . insulin glargine (LANTUS) 100 UNIT/ML injection Inject 70 Units into the skin at bedtime.    . insulin lispro (HUMALOG) 100 UNIT/ML injection Inject 12 Units into the skin 3 (three) times daily before meals. Sliding scale    . losartan (COZAAR) 50 MG tablet Take 1 tablet (50 mg total) by mouth daily. 30 tablet 5  . methylcellulose (ARTIFICIAL TEARS) 1 % ophthalmic solution Place 1 drop into both eyes 2 (two) times daily as needed (dry eyes).    . metoprolol tartrate (LOPRESSOR) 25 MG tablet TAKE ONE TABLET BY MOUTH TWICE DAILY 180 tablet 1  . Multiple Vitamin (MULTIVITAMIN) tablet Take 1  tablet by mouth daily.      . Na Sulfate-K Sulfate-Mg Sulf SOLN Use as directed per Colonoscopy. 354 mL 0  . nitroGLYCERIN (NITROSTAT) 0.4 MG SL tablet Place 1 tablet (0.4 mg total) under the tongue every 5 (five) minutes as needed for chest pain. 30 tablet 12  . Omega-3 Fatty Acids (FISH OIL BURP-LESS PO) Take 1 tablet by mouth daily.      Marland Kitchen omeprazole (PRILOSEC) 20 MG capsule Take 1 capsule (20 mg total) by mouth daily. 90 capsule 1  . Propylhexedrine (BENZEDREX) INHA Place 1 each into the nose as needed (congestion).    . rosuvastatin (CRESTOR) 20 MG tablet Take 1 tablet (20 mg total) by mouth daily. 90 tablet 3   No current facility-administered medications for this visit.     Past Medical History  Diagnosis Date  . Cardiac arrest - ventricular fibrillation 08/26/2009  . CAD (coronary artery disease)     a. OOH MI 6/11; presented with acute CHF; hosp course c/b VF arrest with VDRF, etc;   b s/p CABG: L-LAD/Dx, S-OM/dCFX;  c.Nuclear scan 8/12:  Inferior and inf-lat scar with minimal peri-infarct ischemia, EF 53%.; d.LHC 9/12:  LAD 90%, pD1 (small) 70%, prox large Dx 95%, L-LAD and Dx ok, CFX occluded, S-OM and CFX ok, RCA prox  to prox/mid 60-70%.  Medical management     . Ischemic cardiomyopathy     echo 10/11:  inf-septal and apical HK, EF 35%, mod LAE  . Diabetic coma with ketoacidosis     Occured in 1994 where he spent 30 days in a coma . He develpoed infection of his leg and developing pancreatitis during that  time   . DM2 (diabetes mellitus, type 2)   . Cellulitis 2012    spider bite  . HTN (hypertension)   . HLD (hyperlipidemia)   . Chronic systolic heart failure   . Thyroid disease   . Coma   . Headache(784.0)     sinus  . Anxiety     stress  . CKD (chronic kidney disease)   . Neuropathy   . Carpal tunnel syndrome of right wrist   . 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 Abnormality of gait 04/08/2013  . 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 Foot drop,  bilateral 04/08/2013  . Obesity   . Anxiety and depression   . Retinal detachment     Bilateral, laser surgery on the right  . Nocturnal leg cramps   . History of degenerative disc disease   . Hypercholesteremia   . Pancreatitis 2008  . Dysrhythmia   . AICD (automatic cardioverter/defibrillator) present   . Presence of permanent cardiac pacemaker     ROS:   All systems reviewed and negative except as noted in the HPI.   Past Surgical History  Procedure Laterality Date  . Coronary artery bypass grafting x4  june 29,2011    x 4  . Tonsillectomy  1998  . Appendectomy  mid 1990  . Right sided abdominal cyst removal    . Carpal tunnel with cubital tunnel  02/22/2012    Procedure: CARPAL TUNNEL WITH CUBITAL TUNNEL;  Surgeon: Roseanne Kaufman, MD;  Location: [facility];  Service: Orthopedics;  Laterality: Right;  Right Carpal Tunnel Release/Right Cubital Tunnel Release and Transposition if Necessary with Flexor Pronator Release  . Cardiac surgery    . Ulnar nerve transposition Right   . Retinal detachment surgery Bilateral   . Pacemaker placement    . Cardiac defibrillator placement    . Coronary artery bypass graft    . Retinal detachment surgery Bilateral     3 months ago  . Colonoscopy with propofol N/A 11/17/2014    Procedure: COLONOSCOPY WITH PROPOFOL;  Surgeon: Manus Gunning, MD;  Location: [facility];  Service: Gastroenterology;  Laterality: N/A;     Family History  Problem Relation Age of Onset  . Cancer Maternal Grandmother     unknown ype  . Allergies Mother   . Diabetes Mother   . Hypertension Mother   . Hyperlipidemia Mother   . Dementia Mother   . Stroke Father   . Allergies Father   . Dementia Father   . Alcohol abuse Father   . Diabetes Sister   . Heart disease Sister   . Anxiety disorder Sister   . Colon cancer Mother   . Colon polyps Sister   . Schizophrenia Paternal Uncle   . Schizophrenia Cousin   . Pancreatic cancer Sister   . Lung cancer  Mother   . Kidney cancer Mother   . Colon polyps Father   . Colon polyps Mother      Social History   Social History  . Marital Status: Divorced    Spouse Name: N/A  . Number of Children: 0  .  Years of Education: college   Occupational History  . staffing   .     Social History Main Topics  . Smoking status: Never Smoker   . Smokeless tobacco: Current User    Types: Snuff  . Alcohol Use: 0.0 oz/week    0 Standard drinks or equivalent per week     Comment: rarely  . Drug Use: No  . Sexual Activity: No   Other Topics Concern  . Not on file   Social History Narrative   Patient drinks about 4-5 cups of caffeine daily.   Patient is left handed.     Pulse 61  Ht 5\' 9"  (1.753 m)  Wt 276 lb 3.2 oz (125.283 kg)  BMI 40.77 kg/m2  Physical Exam:  obese appearing 49 year old man,NAD HEENT: Unremarkable Neck:  7 cm JVD, no thyromegally Back:  No CVA tenderness Lungs:  Clear with no wheezes, rales, or rhonchi. HEART:  Regular rate rhythm, no murmurs, no rubs, no clicks Abd:  Soft, obese, positive bowel sounds, no organomegally, no rebound, no guarding Ext:  2 plus pulses, no edema, no cyanosis, no clubbing Skin:  No rashes no nodules Neuro:  CN II through XII intact, motor grossly intact   DEVICE  Normal device function.  See PaceArt for details.   Assess/Plan:

## 2014-11-18 NOTE — Assessment & Plan Note (Signed)
His [place] ICD is working [employer]. Will recheck in several months.

## 2014-11-18 NOTE — Patient Instructions (Addendum)
Medication Instructions:  Your physician has recommended you make the following change in your medication: 1) INCREASE Furosemide to 2 tablets for three day.  Then return to normal dosing.  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 This monitoring reduces the number of office visits required to check your device to one time per year. It allows [place] to keep an eye on the functioning of your device to ensure it is working properly. You are scheduled for a device check from home on 02/17/15. You may send your transmission at any time that day. If you have a wireless device, the transmission will be sent automatically. After your physician reviews your transmission, you will receive a postcard with your next transmission date.  Your physician wants you to follow-up in: 1 year with Dr. Lovena Le.  You will receive a reminder letter in the mail two months in advance. If you don't receive a letter, please call our office to schedule the follow-up appointment.   Any Other Special Instructions Will Be Listed Below (If Applicable). Thank you for choosing [facility]!!

## 2014-11-18 NOTE — Assessment & Plan Note (Signed)
His volume status appears to be worsening. I have asked him to increase his diuretic for 3 days to 80 mg daily. He will maintain a low sodium diet.

## 2014-11-20 ENCOUNTER — Ambulatory Visit (INDEPENDENT_AMBULATORY_CARE_PROVIDER_SITE_OTHER): Payer: 59 | Admitting: Urology

## 2014-11-20 DIAGNOSIS — R312 Other microscopic hematuria: Secondary | ICD-10-CM

## 2014-11-24 ENCOUNTER — Other Ambulatory Visit: Payer: Self-pay | Admitting: *Deleted

## 2014-11-24 DIAGNOSIS — R194 Change in bowel habit: Secondary | ICD-10-CM

## 2014-12-22 ENCOUNTER — Telehealth: Payer: Self-pay

## 2014-12-22 NOTE — Telephone Encounter (Signed)
Poor prep with Suprep on 11/17/14.  Do you want Moviprep or a 2-day prep with Suprep?  Thank you, Angela/PV

## 2014-12-22 NOTE — Telephone Encounter (Signed)
If we can get Moviprep why don't we try that. Please also have him take some miralax with gatorade maybe the day before as well to help with the prep. If you can't easily get Moviprep, 2 day prep with Suprep is okay otherwise. thanks

## 2014-12-24 ENCOUNTER — Ambulatory Visit (AMBULATORY_SURGERY_CENTER): Payer: Self-pay | Admitting: *Deleted

## 2014-12-24 ENCOUNTER — Other Ambulatory Visit: Payer: Self-pay | Admitting: Orthopedic Surgery

## 2014-12-24 VITALS — Ht 69.0 in | Wt 263.0 lb

## 2014-12-24 DIAGNOSIS — Z86010 Personal history of colonic polyps: Secondary | ICD-10-CM

## 2014-12-24 MED ORDER — NA SULFATE-K SULFATE-MG SULF 17.5-3.13-1.6 GM/177ML PO SOLN: 1.0000 | Freq: Once | ORAL | Status: DC

## 2014-12-24 NOTE — Progress Notes (Signed)
No egg or soy allergy. No anesthesia problems.  No home O2.  No diet meds.  

## 2014-12-24 NOTE — Addendum Note (Signed)
Addended by: Dayton Bailiff D on: 12/24/2014 01:30 PM   Modules accepted: Orders

## 2015-01-05 ENCOUNTER — Ambulatory Visit (HOSPITAL_COMMUNITY): Payer: 59 | Admitting: Registered Nurse

## 2015-01-05 ENCOUNTER — Encounter (HOSPITAL_COMMUNITY)
Admission: RE | Disposition: A | Discharge status: Home / Self Care | Payer: Self-pay | Source: Ambulatory Visit | Attending: Gastroenterology

## 2015-01-05 ENCOUNTER — Ambulatory Visit (HOSPITAL_COMMUNITY)
Admission: RE | Admit: 2015-01-05 | Discharge: 2015-01-05 | Disposition: A | Discharge status: Home / Self Care | Payer: 59 | Source: Ambulatory Visit | Attending: Gastroenterology | Admitting: Gastroenterology

## 2015-01-05 ENCOUNTER — Encounter (HOSPITAL_COMMUNITY): Payer: Self-pay

## 2015-01-05 DIAGNOSIS — F419 Anxiety disorder, unspecified: Secondary | ICD-10-CM | POA: Diagnosis not present

## 2015-01-05 DIAGNOSIS — Z6838 Body mass index (BMI) 38.0-38.9, adult: Secondary | ICD-10-CM | POA: Insufficient documentation

## 2015-01-05 DIAGNOSIS — D123 Benign neoplasm of transverse colon: Secondary | ICD-10-CM | POA: Diagnosis not present

## 2015-01-05 DIAGNOSIS — I2510 Atherosclerotic heart disease of native coronary artery without angina pectoris: Secondary | ICD-10-CM | POA: Insufficient documentation

## 2015-01-05 DIAGNOSIS — I5022 Chronic systolic (congestive) heart failure: Secondary | ICD-10-CM | POA: Diagnosis not present

## 2015-01-05 DIAGNOSIS — I130 Hypertensive heart and chronic kidney disease with heart failure and stage 1 through stage 4 chronic kidney disease, or unspecified chronic kidney disease: Secondary | ICD-10-CM | POA: Diagnosis not present

## 2015-01-05 DIAGNOSIS — Z95810 Presence of automatic (implantable) cardiac defibrillator: Secondary | ICD-10-CM | POA: Insufficient documentation

## 2015-01-05 DIAGNOSIS — N189 Chronic kidney disease, unspecified: Secondary | ICD-10-CM | POA: Diagnosis not present

## 2015-01-05 DIAGNOSIS — I472 Ventricular tachycardia: Secondary | ICD-10-CM | POA: Insufficient documentation

## 2015-01-05 DIAGNOSIS — K648 Other hemorrhoids: Secondary | ICD-10-CM | POA: Insufficient documentation

## 2015-01-05 DIAGNOSIS — E785 Hyperlipidemia, unspecified: Secondary | ICD-10-CM | POA: Diagnosis not present

## 2015-01-05 DIAGNOSIS — D125 Benign neoplasm of sigmoid colon: Secondary | ICD-10-CM | POA: Diagnosis not present

## 2015-01-05 DIAGNOSIS — G4730 Sleep apnea, unspecified: Secondary | ICD-10-CM | POA: Insufficient documentation

## 2015-01-05 DIAGNOSIS — Z1211 Encounter for screening for malignant neoplasm of colon: Secondary | ICD-10-CM

## 2015-01-05 DIAGNOSIS — E119 Type 2 diabetes mellitus without complications: Secondary | ICD-10-CM | POA: Insufficient documentation

## 2015-01-05 DIAGNOSIS — I255 Ischemic cardiomyopathy: Secondary | ICD-10-CM | POA: Insufficient documentation

## 2015-01-05 DIAGNOSIS — Z888 Allergy status to other drugs, medicaments and biological substances status: Secondary | ICD-10-CM | POA: Insufficient documentation

## 2015-01-05 DIAGNOSIS — I252 Old myocardial infarction: Secondary | ICD-10-CM | POA: Insufficient documentation

## 2015-01-05 DIAGNOSIS — R194 Change in bowel habit: Secondary | ICD-10-CM

## 2015-01-05 HISTORY — PX: COLONOSCOPY WITH PROPOFOL: SHX5780

## 2015-01-05 LAB — GLUCOSE, CAPILLARY: Glucose-Capillary: 115 mg/dL — ABNORMAL HIGH (ref 65–99)

## 2015-01-05 SURGERY — COLONOSCOPY WITH PROPOFOL
Anesthesia: Monitor Anesthesia Care

## 2015-01-05 MED ORDER — PROPOFOL 500 MG/50ML IV EMUL
INTRAVENOUS | Status: DC | PRN
  Administered 2015-01-05: 200 ug/kg/min via INTRAVENOUS

## 2015-01-05 MED ORDER — SODIUM CHLORIDE 0.9 % IV SOLN: INTRAVENOUS | Status: DC

## 2015-01-05 MED ORDER — PROPOFOL 10 MG/ML IV BOLUS
INTRAVENOUS | Status: AC
  Filled 2015-01-05: qty 20

## 2015-01-05 MED ORDER — FENTANYL CITRATE (PF) 100 MCG/2ML IJ SOLN: 25.0000 ug | INTRAMUSCULAR | Status: DC | PRN

## 2015-01-05 MED ORDER — LACTATED RINGERS IV SOLN
INTRAVENOUS | Status: DC
  Administered 2015-01-05: 1000 mL via INTRAVENOUS

## 2015-01-05 NOTE — Op Note (Signed)
[facility] [place], [id number]   COLONOSCOPY PROCEDURE REPORT  PATIENT: Matthew [name], Matthew [name]  MR#: [id number] BIRTHDATE: Nov 29, 1965 , 48  yrs. old GENDER: male ENDOSCOPIST: Yetta Flock, MD REFERRED BY: PROCEDURE DATE:  01/05/2015 PROCEDURE:   Colonoscopy, screening and Colonoscopy with biopsy First Screening Colonoscopy - Avg.  risk and is 50 yrs.  old or older Yes.  Prior Negative Screening - Now for repeat screening. N/A  History of Adenoma - Now for follow-up colonoscopy & has been > or = to 3 yrs.  N/A  Polyps removed today? Yes ASA CLASS:   Class III INDICATIONS:Screening for colonic neoplasia and Colorectal Neoplasm Risk Assessment for this procedure is average risk. MEDICATIONS: Per Anesthesia  DESCRIPTION OF PROCEDURE:   After the risks benefits and alternatives of the procedure were thoroughly explained, informed consent was obtained.  The digital rectal exam revealed no abnormalities of the rectum.   The EC-[id number] ([id number])  endoscope was introduced through the anus and advanced to the cecum, which was identified by both the appendix and ileocecal valve. No adverse events experienced.   The quality of the prep was adequate  The instrument was then slowly withdrawn as the colon was fully examined. Estimated blood loss is zero unless otherwise noted in this procedure report.  COLON FINDINGS: Two flat polyps ranging from 3 to 31mm in size were found in the transverse colon.  Polypectomies were performed with cold forceps.  The resection was complete, the polyp tissue was completely retrieved and sent to histology.   A flat polyp measuring 4 mm in size was found at the splenic flexure.  A polypectomy was performed with cold forceps.  The resection was complete, the polyp tissue was completely retrieved and sent to histology.   Two sessile polyps measuring 3 mm in size were found in the sigmoid colon.  Polypectomies were performed with  cold forceps.  The resection was complete, the polyp tissue was completely retrieved and sent to histology.   The examination was otherwise normal.  Retroflexed views revealed internal hemorrhoids. The time to cecum = 2.7 Withdrawal time = 20.7   The scope was withdrawn and the procedure completed. COMPLICATIONS: There were no immediate complications.      ENDOSCOPIC IMPRESSION: 1.   Two flat polyps ranging from 3 to 47mm in size were found in the transverse colon; polypectomies were performed with cold forceps 2.   Flat polyp was found at the splenic flexure; polypectomy was performed with cold forceps 3.   Two sessile polyps were found in the sigmoid colon; polypectomies were performed with cold forceps 4.   The examination was otherwise normal  RECOMMENDATIONS: Resume diet Resume medications Await pathology results  eSigned:  Yetta Flock, MD 01/05/2015 10:00 AM   cc: the patient   PATIENT NAME:  Matthew [name], Matthew [name] MR#: [id number]

## 2015-01-05 NOTE — Anesthesia Preprocedure Evaluation (Addendum)
Anesthesia Evaluation  Patient identified by MRN, date of birth, ID band Patient awake    Reviewed: Allergy & Precautions, H&P , NPO status , Patient's Chart, lab work & pertinent test results, reviewed documented beta blocker date and time   Airway Mallampati: III  TM Distance: >3 FB Neck ROM: full    Dental  (+) Dental Advisory Given, Caps, Partial Upper, Poor Dentition, Missing All upper front are permanent partial.  Missing front lateral lower:   Pulmonary sleep apnea ,    Pulmonary exam normal breath sounds clear to auscultation       Cardiovascular Exercise Tolerance: Good hypertension, Pt. on medications and Pt. on home beta blockers + CAD, + CABG and +CHF  Normal cardiovascular exam+ dysrhythmias Ventricular Tachycardia and Ventricular Fibrillation + pacemaker + Cardiac Defibrillator  Rhythm:regular Rate:Normal  LAFB. Chronic systolic heart failure but good EF   Neuro/Psych Anxiety Panic disordernegative neurological ROS  negative psych ROS   GI/Hepatic negative GI ROS, Neg liver ROS,   Endo/Other  diabetes, Well Controlled, Type 2, Insulin DependentMorbid obesity  Renal/GU negative Renal ROS  negative genitourinary   Musculoskeletal   Abdominal (+) + obese,   Peds  Hematology negative hematology ROS (+)   Anesthesia Other Findings   Reproductive/Obstetrics negative OB ROS                            Anesthesia Physical Anesthesia Plan  ASA: IV  Anesthesia Plan: MAC   Post-op Pain Management:    Induction:   Airway Management Planned:   Additional Equipment:   Intra-op Plan:   Post-operative Plan:   Informed Consent:   Plan Discussed with: Surgeon  Anesthesia Plan Comments:         Anesthesia Quick Evaluation

## 2015-01-05 NOTE — Anesthesia Postprocedure Evaluation (Signed)
  Anesthesia Post-op Note  Patient: Matthew [name]  Procedure(s) Performed: Procedure(s) (LRB): COLONOSCOPY WITH PROPOFOL (N/A)  Patient Location: PACU  Anesthesia Type: MAC  Level of Consciousness: awake and alert   Airway and Oxygen Therapy: Patient Spontanous Breathing  Post-op Pain: mild  Post-op Assessment: Post-op Vital signs reviewed, Patient's Cardiovascular Status Stable, Respiratory Function Stable, Patent Airway and No signs of Nausea or vomiting  Last Vitals:  Filed Vitals:   01/05/15 1020  BP: 121/63  Pulse: 66  Temp:   Resp: 15    Post-op Vital Signs: stable   Complications: No apparent anesthesia complications

## 2015-01-05 NOTE — Transfer of Care (Signed)
Immediate Anesthesia Transfer of Care Note  Patient: Matthew [name]  Procedure(s) Performed: Procedure(s): COLONOSCOPY WITH PROPOFOL (N/A)  Patient Location: PACU  Anesthesia Type:MAC  Level of Consciousness: awake, oriented and patient cooperative  Airway & Oxygen Therapy: Patient Spontanous Breathing and Patient connected to face mask oxygen  Post-op Assessment: Report given to RN, Post -op Vital signs reviewed and stable and Patient moving all extremities X 4  Post vital signs: stable  Last Vitals:  Filed Vitals:   01/05/15 1001  BP:   Pulse:   Temp:   Resp: 12    Complications: No apparent anesthesia complications

## 2015-01-05 NOTE — Interval H&P Note (Signed)
History and Physical Interval Note:  01/05/2015 9:11 AM  Matthew [name]  has presented today for surgery, with the diagnosis of diagnostic colonoscopy  The various methods of treatment have been discussed with the patient and family. After consideration of risks, benefits and other options for treatment, the patient has consented to  Procedure(s): COLONOSCOPY WITH PROPOFOL (N/A) as a surgical intervention .  The patient's history has been reviewed, patient examined, no change in status, stable for surgery.  I have reviewed the patient's chart and labs.  Questions were answered to the patient's satisfaction.    The patient's last colonoscopy was limited by poor prep. Here for repeat colonoscopy with different bowel preparation   [name]

## 2015-01-05 NOTE — Discharge Instructions (Signed)
Colonoscopy, Care After °Refer to this sheet in the next few weeks. These instructions provide you with information on caring for yourself after your procedure. Your health care provider may also give you more specific instructions. Your treatment has been planned according to current medical practices, but problems sometimes occur. Call your health care provider if you have any problems or questions after your procedure. °WHAT TO EXPECT AFTER THE PROCEDURE  °After your procedure, it is typical to have the following: °· A small amount of blood in your stool. °· Moderate amounts of gas and mild abdominal cramping or bloating. °HOME CARE INSTRUCTIONS °· Do not drive, operate machinery, or sign important documents for 24 hours. °· You may shower and resume your regular physical activities, but move at a slower pace for the first 24 hours. °· Take frequent rest periods for the first 24 hours. °· Walk around or put a warm pack on your abdomen to help reduce abdominal cramping and bloating. °· Drink enough fluids to keep your urine clear or pale yellow. °· You may resume your normal diet as instructed by your health care provider. Avoid heavy or fried foods that are hard to digest. °· Avoid drinking alcohol for 24 hours or as instructed by your health care provider. °· Only take over-the-counter or prescription medicines as directed by your health care provider. °· If a tissue sample (biopsy) was taken during your procedure: °¨ Do not take aspirin or blood thinners for 7 days, or as instructed by your health care provider. °¨ Do not drink alcohol for 7 days, or as instructed by your health care provider. °¨ Eat soft foods for the first 24 hours. °SEEK MEDICAL CARE IF: °You have persistent spotting of blood in your stool 2-3 days after the procedure. °SEEK IMMEDIATE MEDICAL CARE IF: °· You have more than a small spotting of blood in your stool. °· You pass large blood clots in your stool. °· Your abdomen is swollen  (distended). °· You have nausea or vomiting. °· You have a fever. °· You have increasing abdominal pain that is not relieved with medicine. °  °This information is not intended to replace advice given to you by your health care provider. Make sure you discuss any questions you have with your health care provider. °  °Document Released: 10/05/2003 Document Revised: 12/11/2012 Document Reviewed: 10/28/2012 °[employer] Interactive Patient Education ©2016 [employer] ° °

## 2015-01-05 NOTE — H&P (Signed)
Matthew [name]  11/04/2014 10:00 AM  Office Visit  MRN:  [id number]   Description: Male DOB: 1965/09/14  Provider: Manus Gunning, MD  Department: [facility]       Diagnoses     Colon cancer screening - Primary    ICD-9-CM: V76.51 ICD-10-CM: Z12.11    High risk medication use     ICD-9-CM: V58.69 ICD-10-CM: Z79.899    Heart disease     ICD-9-CM: 429.9 ICD-10-CM: I51.9       Reason for Visit     discuss colonoscopy    has pacemaker and defibrillator; followed by Dr Cristopher Peru    Constipation    new onset with bloating    Reason for Visit History        Current Vitals  Most recent update: 11/04/2014 10:20 AM by Larina Bras, CMA    BP Pulse Ht Wt BMI    130/70 mmHg 56 5\' 9"  (1.753 m) 271 lb (122.925 kg) 40.00 kg/m2    Vitals History     Progress Notes      Manus Gunning, MD at 11/04/2014 11:48 AM     Status: Signed       Expand All Collapse All     HPI :  The patient is a 49 year old male referred to [place] in consultation by Dr. Moshe Cipro for colorectal cancer screening. Patient has a history of ventricular fibrillation arrest in 2011 from acute MI/CAD. He underwent coronary artery bypass surgery at that time and has pacemaker/defibrillator in place. He reports no blood in the stools with regular bowel habits he has some occasional bloating of his lower abdomen but otherwise has no abdominal complaints. He's had no weight loss. He reports his mother had colon cancer diagnosed in her 34s to 66s timeframe. Regarding his cardiac status he denies any issues with his pacemaker defibrillator. He denies any exertional chest pain. He reports some intermittent mild shortness of breath with exertion he attributes to deconditioning given he does not exercise. He takes aspirin 325 mg daily for his cardiac history, although also endorses taking Aleve at least 2 times a day for joint pains. Upon questioning he reports that he  thinks he had an ulcer in his stomach as a young man many years ago but does not know the details of this. He has no history of GI bleeding that he is aware of. His most recent ejection fraction on echocardiogram was 35% in 2011. He denies any history of side effects from anesthesia. Recent labs show normal hemoglobin.  Past Medical History  Diagnosis Date  . Cardiac arrest - ventricular fibrillation 08/26/2009  . CAD (coronary artery disease)     a. OOH MI 6/11; presented with acute CHF; hosp course c/b VF arrest with VDRF, etc; b s/p CABG: L-LAD/Dx, S-OM/dCFX; c.Nuclear scan 8/12: Inferior and inf-lat scar with minimal peri-infarct ischemia, EF 53%.; d.LHC 9/12: LAD 90%, pD1 (small) 70%, prox large Dx 95%, L-LAD and Dx ok, CFX occluded, S-OM and CFX ok, RCA prox to prox/mid 60-70%. Medical management   . Ischemic cardiomyopathy     echo 10/11: inf-septal and apical HK, EF 35%, mod LAE  . Diabetic coma with ketoacidosis     Occured in 1994 where he spent 30 days in a coma . He develpoed infection of his leg and developing pancreatitis during that time   . DM2 (diabetes mellitus, type 2)   . Cellulitis 2012    spider bite  . HTN (hypertension)   .  HLD (hyperlipidemia)   . Chronic systolic heart failure   . Thyroid disease   . Coma   . Headache(784.0)     sinus  . Anxiety     stress  . CKD (chronic kidney disease)   . Neuropathy   . Carpal tunnel syndrome of right wrist   . 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 Abnormality of gait 04/08/2013  . 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 Foot drop, bilateral 04/08/2013  . Obesity   . Anxiety and depression   . Retinal detachment     Bilateral, laser surgery on the right  . Nocturnal leg cramps   . History of degenerative disc disease   . Hypercholesteremia   . Pancreatitis 2008     Past Surgical  History  Procedure Laterality Date  . Coronary artery bypass grafting x4  june 29,2011    x 4  . Tonsillectomy  1998  . Appendectomy  mid 1990  . Right sided abdominal cyst removal    . Carpal tunnel with cubital tunnel  02/22/2012    Procedure: CARPAL TUNNEL WITH CUBITAL TUNNEL; Surgeon: Roseanne Kaufman, MD; Location: [facility]; Service: Orthopedics; Laterality: Right; Right Carpal Tunnel Release/Right Cubital Tunnel Release and Transposition if Necessary with Flexor Pronator Release  . Cardiac surgery    . Ulnar nerve transposition Right   . Retinal detachment surgery Bilateral   . Pacemaker placement    . Cardiac defibrillator placement     Family History  Problem Relation Age of Onset  . Cancer Maternal Grandmother     unknown ype  . Allergies Mother   . Diabetes Mother   . Hypertension Mother   . Hyperlipidemia Mother   . Dementia Mother   . Stroke Father   . Allergies Father   . Dementia Father   . Alcohol abuse Father   . Diabetes Sister   . Heart disease Sister   . Anxiety disorder Sister   . Colon cancer Mother   . Colon polyps Sister   . Schizophrenia Paternal Uncle   . Schizophrenia Cousin   . Pancreatic cancer Sister   . Lung cancer Mother   . Kidney cancer Mother   . Colon polyps Father   . Colon polyps Mother    Social History  Substance Use Topics  . Smoking status: Never Smoker   . Smokeless tobacco: Current User    Types: Snuff  . Alcohol Use: 0.0 oz/week    0 Standard drinks or equivalent per week     Comment: rarely   Current Outpatient Prescriptions  Medication Sig Dispense Refill  . aspirin 325 MG tablet Take 325 mg by mouth daily.     . baclofen (LIORESAL) 10 MG tablet Take 1 tablet (10 mg total) by mouth at bedtime. 30 each 3  . busPIRone (BUSPAR) 5 MG tablet  Take 1 tablet (5 mg total) by mouth 3 (three) times daily. 90 tablet 2  . Chromium Picolinate 200 MCG CAPS Take 200 mcg by mouth daily.     . clonazePAM (KLONOPIN) 1 MG tablet Take 1 tablet (1 mg total) by mouth 3 (three) times daily. 90 tablet 2  . ezetimibe (ZETIA) 10 MG tablet Take 1 tablet (10 mg total) by mouth daily. 90 tablet 3  . FLUoxetine (PROZAC) 20 MG capsule Take 3 capsules (60 mg total) by mouth daily. 90 capsule 2  . furosemide (LASIX) 40 MG tablet TAKE ONE TABLET BY MOUTH ONCE DAILY 90 tablet 2  .  gabapentin (NEURONTIN) 300 MG capsule Take 1 capsule (300 mg total) by mouth at bedtime. 90 capsule 5  . Insulin Glargine (LANTUS SOLOSTAR) 100 UNIT/ML SOPN 70 units at bedtime 21 pen 3  . insulin lispro (HUMALOG) 100 UNIT/ML injection Inject 12 Units into the skin 3 (three) times daily before meals. Sliding scale    . losartan (COZAAR) 50 MG tablet Take 1 tablet (50 mg total) by mouth daily. 30 tablet 5  . methylcellulose (ARTIFICIAL TEARS) 1 % ophthalmic solution Place 1 drop into both eyes 2 (two) times daily as needed (dry eyes).    . metoprolol tartrate (LOPRESSOR) 25 MG tablet TAKE ONE TABLET BY MOUTH TWICE DAILY 180 tablet 1  . Multiple Vitamin (MULTIVITAMIN) tablet Take 1 tablet by mouth daily.     . Omega-3 Fatty Acids (FISH OIL BURP-LESS PO) Take 1 tablet by mouth daily.     . rosuvastatin (CRESTOR) 20 MG tablet Take 1 tablet (20 mg total) by mouth daily. 90 tablet 3  . nitroGLYCERIN (NITROSTAT) 0.4 MG SL tablet Place 0.4 mg under the tongue every 5 (five) minutes as needed for chest pain.    Marland Kitchen omeprazole (PRILOSEC) 20 MG capsule Take 1 capsule (20 mg total) by mouth daily. 90 capsule 1   No current facility-administered medications for this visit.   Allergies  Allergen Reactions  . Ace Inhibitors Cough  . Cephalexin Other (See Comments)    Resistant to antibiotic  . Beta  Adrenergic Blockers Cough     Review of Systems: All systems reviewed and negative except where noted in HPI.    Recent labs reviewed in Epic, normal Hgb. Physical Exam: BP 130/70 mmHg  Pulse 56  Ht 5\' 9"  (1.753 m)  Wt 271 lb (122.925 kg)  BMI 40.00 kg/m2 Constitutional: Pleasant,well-developed, male in no acute distress. HEENT: Normocephalic and atraumatic. Conjunctivae are normal. No scleral icterus. Neck supple.  Cardiovascular: Normal rate, regular rhythm.  Pulmonary/chest: Effort normal and breath sounds normal. No wheezing, rales or rhonchi. Abdominal: Soft, protuberant, nondistended, nontender. Bowel sounds active throughout. There are no masses palpable. No hepatomegaly. Extremities: (+) 1 edema bilaterally Neurological: Alert and oriented to person place and time. Skin: Skin is warm and dry. No rashes noted. Psychiatric: Normal mood and affect. Behavior is normal.   ASSESSMENT AND PLAN: 49 year old male with a significant cardiac history as outlined above as well as a strong family history of colon cancer (mother) presenting for his first time colon cancer screening. Patient has a pacemaker and defibrillator that was placed in 2011 but he denies any recent issues with this and his last ejection fraction is 35%. He denies any new or baseline cardiopulmonary symptoms that bother him. I discussed the risks and benefits of optical colonoscopy as outlined below. Given his family history of colon cancer and that he is overdue for a screening exam, I think an optical colonoscopy is the best modality for his screening at this time. We will discuss his case with our anesthesia providers to determine if they wish to perform his case at our endoscopy center or if his case needs to be done at the hospital. Regarding his aspirin use, I think it safe to continue regular dose of aspirin at this time as this should not significantly increase his risk of bleeding should a polyp need to be  removed and he has a clear need for it otherwise.  Otherwise, the patient endorses taking over-the-counter Aleve twice daily in addition to his regular aspirin for joint  pains. He also endorses a questionable history of peptic ulcer disease remotely for which we don't have records of. I discussed the risks of long-term NSAID use to include GI bleeding and kidney disease. I would recommend he avoid routine NSAID use due to these issues however he is limited by significant joint pains requiring the use of a cane to walk. In this light if he continues to use NSAIDs in addition to his aspirin I'm recommending prophylactic omeprazole 20 mg once a day to prevent NSAID related GI bleeding. If he takes omeprazole routinely he will need to have his renal function checked periodically, it appears stable on review of labs. He verbalized understanding of this issue and agreed.  The indications, risks, and benefits of colonoscopy were otherwise explained to the patient in detail. Risks include but not limited to bleeding, perforation, adverse reaction to medications, cardiopulmonary compromise. Sequelae include but not limited to need for surgery, hospitalization, blood transfusion, disability, morbidity, and mortality was explained. Patient verbalized understanding and wished to proceed. All questions answered, referred to scheduler and bowel prep provided. Further recommendations pending results of the exam.   [place] Cellar, MD [facility]               Psych Notes     No notes of this type exist for this encounter.     [facility] Patient Outreach     No notes of this type exist for this encounter.     Not recorded     Medications Ordered This Encounter       Disp Refills Start End    omeprazole (PRILOSEC) 20 MG capsule 90 capsule 1 11/04/2014     Take 1 capsule (20 mg total) by mouth daily. - Oral      Discontinued Medications       Reason for Discontinue      fluticasone (FLONASE) 50 MCG/ACT nasal spray Error         Patient Instructions     We have sent medications to your pharmacy for you to pick up at your convenience.   You have been scheduled for a colonoscopy. Please follow written instructions given to you at your visit today.  Please pick up your prep supplies at the pharmacy within the next 1-3 days. If you use inhalers (even only as needed), please bring them with you on the day of your procedure.         Patient Instructions History      Referring Provider     [name], MD     Level of Service     PR OFFICE OUTPATIENT NEW 30 MINUTES [phone]         Transcription     Patient Instruction by Provider Default, MD on 12/01/2014 4:42 PM     Document Information     All Flowsheet Templates (all recorded)     Anthropometrics    Custom Formula Data    Encounter Vitals      Letters       Status    Renelda Loma Armbruster on 11/04/2014 Sent      Referring Provider     [name], MD     All Charges for This Encounter     Code Description Service Date Service Provider Modifiers Qty    [phone] PR OFFICE OUTPATIENT NEW 30 MINUTES 11/04/2014 Manus Gunning, MD  1      Routing History     Recipient Method Sent by Date  Sent Routed to    [facility]	[place], [place] [[id number]] 11/04/2014 Not routed    [address] [place] [id number] Letter from Manus Gunning, MD created on 11/04/2014       AVS Reports     Date/Time Report Action User    11/04/2014 11:06 AM After Visit Summary Printed [place], [place]    11/04/2014 10:26 AM After Visit Summary Printed Larina Bras, CMA      Smoking Cessation Audit Trail       Ready to Quit Counseling Given User Date and Time    Office Visit - 11/04/2014 Yes Yes Larina Bras, [place] 11/04/2014 10:26 AM    Office Visit - 10/01/2014 No Yes [name], MD  10/03/2014 4:30 PM    Office Visit - 12/11/2013 Not Answered No [name], MD 01/03/2014 7:08 PM    Office Visit - 11/25/2013 Not Answered No Carole Civil, MD 11/25/2013 3:44 PM    Office Visit - 05/13/2013 Not Answered No Carole Civil, MD 05/13/2013 2:45 PM    Office Visit - 10/16/2012 Not Answered No Reita May, [place] 10/16/2012 2:54 PM    Office Visit - 11/13/2011 Yes Not Answered Alonza Smoker, LCSW 11/13/2011 11:00 AM    Office Visit - 07/11/2011 Not Answered Yes [name], MD 07/16/2011 3:03 PM    Office Visit - 11/15/2010 No Yes [name], MD 11/15/2010 1:09 PM      Diabetic Foot Exam    No data filed     Diabetic Foot Form - Detailed    No data filed     Diabetic Foot Exam - Simple    No data filed     Guarantor Account: [place], [place] ([id number])     Relation to Patient: Account Type Service Area    Self Personal/Family [place] for This Account     Coverage ID Payor Plan Insurance ID    [id number] [place] [id number]        Guarantor Account: Wayde, [place] ([id number])     Relation to Patient: Account Type [place] for This Account     Coverage ID Payor Plan Insurance ID    [id number] [place] [id number]        Guarantor Account: Ferguson, Gertner ([id number])     Relation to Patient: Account Type Service Area    Self Personal/Family [place] for This Account     Coverage ID Payor Plan Insurance ID    [id number] [place] [employer]IGATE [id number]        Guarantor Account: Linkon, Siverson ([id number])     Relation to Patient: Account Type Service Area    Self Personal/Family [facility]          Guarantor Account: Eldean, Nanna ([id number])     Relation to Patient: Account Type Service Area    Self Personal/Family [place]          History     Reviewed By Date/Time Sections Reviewed    Manus Gunning, MD 11/04/2014 11:47 AM Tobacco    Dixon Boos, [place] 11/04/2014 10:26 AM Medical, Surgical, Tobacco, Alcohol,  Drug Use, Sexual Activity, Family

## 2015-01-06 ENCOUNTER — Encounter (HOSPITAL_COMMUNITY): Payer: Self-pay | Admitting: Gastroenterology

## 2015-01-07 ENCOUNTER — Encounter: Payer: Self-pay | Admitting: Family Medicine

## 2015-01-07 ENCOUNTER — Encounter: Payer: Self-pay | Admitting: Gastroenterology

## 2015-01-07 DIAGNOSIS — D126 Benign neoplasm of colon, unspecified: Secondary | ICD-10-CM | POA: Insufficient documentation

## 2015-01-22 ENCOUNTER — Ambulatory Visit (INDEPENDENT_AMBULATORY_CARE_PROVIDER_SITE_OTHER): Payer: 59 | Admitting: Psychiatry

## 2015-01-22 ENCOUNTER — Encounter (HOSPITAL_COMMUNITY): Payer: Self-pay | Admitting: Psychiatry

## 2015-01-22 VITALS — BP 120/76 | Ht 69.0 in | Wt 260.0 lb

## 2015-01-22 DIAGNOSIS — F418 Other specified anxiety disorders: Secondary | ICD-10-CM | POA: Diagnosis not present

## 2015-01-22 MED ORDER — BUSPIRONE HCL 5 MG PO TABS: 5.0000 mg | ORAL_TABLET | Freq: Three times a day (TID) | ORAL | Status: DC

## 2015-01-22 MED ORDER — FLUOXETINE HCL 20 MG PO CAPS: 60.0000 mg | ORAL_CAPSULE | Freq: Every day | ORAL | Status: DC

## 2015-01-22 MED ORDER — CLONAZEPAM 1 MG PO TABS: 1.0000 mg | ORAL_TABLET | Freq: Three times a day (TID) | ORAL | Status: DC
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
## 2015-02-17 ENCOUNTER — Encounter: Payer: 59 | Admitting: *Deleted

## 2015-02-17 ENCOUNTER — Telehealth: Payer: Self-pay | Admitting: Cardiology

## 2015-02-17 NOTE — Telephone Encounter (Signed)
LMOVM reminding pt to send remote transmission.   

## 2015-02-18 ENCOUNTER — Encounter: Payer: Self-pay | Admitting: Cardiology

## 2015-04-04 ENCOUNTER — Other Ambulatory Visit: Payer: Self-pay | Admitting: Neurology

## 2015-04-04 ENCOUNTER — Other Ambulatory Visit: Payer: Self-pay | Admitting: Internal Medicine

## 2015-04-16 ENCOUNTER — Ambulatory Visit (INDEPENDENT_AMBULATORY_CARE_PROVIDER_SITE_OTHER): Payer: Commercial Managed Care - HMO | Admitting: *Deleted

## 2015-04-16 DIAGNOSIS — I4729 Other ventricular tachycardia: Secondary | ICD-10-CM

## 2015-04-16 DIAGNOSIS — I472 Ventricular tachycardia: Secondary | ICD-10-CM

## 2015-04-20 NOTE — Progress Notes (Signed)
Remote ICD transmission.   

## 2015-04-22 ENCOUNTER — Ambulatory Visit (INDEPENDENT_AMBULATORY_CARE_PROVIDER_SITE_OTHER): Payer: Commercial Managed Care - HMO | Admitting: Neurology

## 2015-04-22 ENCOUNTER — Encounter: Payer: Self-pay | Admitting: Neurology

## 2015-04-22 VITALS — BP 141/72 | HR 49 | Ht 69.0 in | Wt 257.5 lb

## 2015-04-22 DIAGNOSIS — E1142 Type 2 diabetes mellitus with diabetic polyneuropathy: Secondary | ICD-10-CM | POA: Diagnosis not present

## 2015-04-22 DIAGNOSIS — G2581 Restless legs syndrome: Secondary | ICD-10-CM | POA: Insufficient documentation

## 2015-04-22 DIAGNOSIS — R269 Unspecified abnormalities of gait and mobility: Secondary | ICD-10-CM | POA: Diagnosis not present

## 2015-04-22 DIAGNOSIS — M21372 Foot drop, left foot: Secondary | ICD-10-CM | POA: Diagnosis not present

## 2015-04-22 DIAGNOSIS — M21371 Foot drop, right foot: Secondary | ICD-10-CM | POA: Diagnosis not present

## 2015-04-22 HISTORY — DX: Restless legs syndrome: G25.81

## 2015-04-22 MED ORDER — GABAPENTIN 300 MG PO CAPS: 300.0000 mg | ORAL_CAPSULE | Freq: Every day | ORAL | Status: DC

## 2015-04-22 NOTE — Patient Instructions (Signed)
Restless Legs Syndrome Restless legs syndrome is a condition that causes uncomfortable feelings or sensations in the legs, especially while sitting or lying down. The sensations usually cause an overwhelming urge to move the legs. The arms can also sometimes be affected. The condition can range from mild to severe. The symptoms often interfere with a person's ability to sleep. CAUSES The cause of this condition is not known. RISK FACTORS This condition is more likely to develop in:  People who are older than age 50.  Pregnant women. In general, restless legs syndrome is more common in women than in men.  People who have a family history of the condition.  People who have certain medical conditions, such as iron deficiency, kidney disease, Parkinson disease, or nerve damage.  People who take certain medicines, such as medicines for high blood pressure, nausea, colds, allergies, depression, and some heart conditions. SYMPTOMS The main symptom of this condition is uncomfortable sensations in the legs. These sensations may be:  Described as pulling, tingling, prickling, throbbing, crawling, or burning.  Worse while you are sitting or lying down.  Worse during periods of rest or inactivity.  Worse at night, often interfering with your sleep.  Accompanied by a very strong urge to move your legs.  Temporarily relieved by movement of your legs. The sensations usually affect both sides of the body. The arms can also be affected, but this is rare. People who have this condition often have tiredness during the day because of their lack of sleep at night. DIAGNOSIS This condition may be diagnosed based on your description of the symptoms. You may also have tests, including blood tests, to check for other conditions that may lead to your symptoms. In some cases, you may be asked to spend some time in a sleep lab so your sleeping can be monitored. TREATMENT Treatment for this condition is  focused on managing the symptoms. Treatment may include:  Self-help and lifestyle changes.  Medicines. HOME CARE INSTRUCTIONS  Take medicines only as directed by your health care provider.  Try these methods to get temporary relief from the uncomfortable sensations:  Massage your legs.  Walk or stretch.  Take a cold or hot bath.  Practice good sleep habits. For example, go to bed and get up at the same time every day.  Exercise regularly.  Practice ways of relaxing, such as yoga or meditation.  Avoid caffeine and alcohol.  Do not use any tobacco products, including cigarettes, chewing tobacco, or electronic cigarettes. If you need help quitting, ask your health care provider.  Keep all follow-up visits as directed by your health care provider. This is important. SEEK MEDICAL CARE IF: Your symptoms do not improve with treatment, or they get worse.   This information is not intended to replace advice given to you by your health care provider. Make sure you discuss any questions you have with your health care provider.   Document Released: 02/10/2002 Document Revised: 07/07/2014 Document Reviewed: 02/16/2014 Elsevier Interactive Patient Education 2016 [employer]  

## 2015-04-22 NOTE — Progress Notes (Signed)
Reason for visit: Diabetic peripheral neuropathy  Matthew [name] is an 50 y.o. male  History of present illness:  Matthew [name] is a 50 year old left-handed white male with a history of diabetes with a significant diabetic peripheral neuropathy. The patient has bilateral foot drops, and weakness in the intrinsic muscles of the hands bilaterally. The patient reports nocturnal leg cramps which have improved some on baclofen taking 10 mg at night. The patient has tingling sensations in the feet that is worse at night, and he has a restless leg syndrome associated with this. In the past, he took baclofen, but he has run out of the prescription more recently. The patient was given a prescription for AFO braces bilaterally, but he went to [place], and they showed him PAFO braces which he did not wish to purchase. The patient has fallen with some regularity, he may fall once or twice a month, he uses a walking stick for ambulation. He returns to the office today for an evaluation.  Past Medical History  Diagnosis Date  . Cardiac arrest - ventricular fibrillation 08/26/2009  . CAD (coronary artery disease)     a. OOH MI 6/11; presented with acute CHF; hosp course c/b VF arrest with VDRF, etc;   b s/p CABG: L-LAD/Dx, S-OM/dCFX;  c.Nuclear scan 8/12:  Inferior and inf-lat scar with minimal peri-infarct ischemia, EF 53%.; d.LHC 9/12:  LAD 90%, pD1 (small) 70%, prox large Dx 95%, L-LAD and Dx ok, CFX occluded, S-OM and CFX ok, RCA prox to prox/mid 60-70%.  Medical management     . Ischemic cardiomyopathy     echo 10/11:  inf-septal and apical HK, EF 35%, mod LAE  . Diabetic coma with ketoacidosis ([place])     Occured in 1994 where he spent 30 days in a coma . He develpoed infection of his leg and developing pancreatitis during that  time   . DM2 (diabetes mellitus, type 2) ([place])   . Cellulitis 2012    spider bite  . HTN (hypertension)   . HLD (hyperlipidemia)   . Chronic systolic heart failure  ([place])   . Thyroid disease   . Coma ([place])   . Headache(784.0)     sinus  . Anxiety     stress  . CKD (chronic kidney disease)   . Neuropathy ([place])   . Carpal tunnel syndrome of right wrist   . 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 Abnormality of gait 04/08/2013  . 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 Foot drop, bilateral 04/08/2013  . Obesity   . Anxiety and depression   . Retinal detachment     Bilateral, laser surgery on the right  . Nocturnal leg cramps   . History of degenerative disc disease   . Hypercholesteremia   . Pancreatitis 2008  . Dysrhythmia   . AICD (automatic cardioverter/defibrillator) present   . Presence of permanent cardiac pacemaker   . RLS (restless legs syndrome) 04/22/2015    Past Surgical History  Procedure Laterality Date  . Coronary artery bypass grafting x4  june 29,2011    x 4  . Tonsillectomy  1998  . Appendectomy  mid 1990  . Right sided abdominal cyst removal    . Carpal tunnel with cubital tunnel  02/22/2012    Procedure: CARPAL TUNNEL WITH CUBITAL TUNNEL;  Surgeon: Roseanne Kaufman, MD;  Location: [facility];  Service: Orthopedics;  Laterality: Right;  Right Carpal Tunnel Release/Right Cubital Tunnel Release and Transposition if Necessary  with Flexor Pronator Release  . Cardiac surgery    . Ulnar nerve transposition Right   . Retinal detachment surgery Bilateral   . Pacemaker placement    . Cardiac defibrillator placement    . Coronary artery bypass graft    . Retinal detachment surgery Bilateral     3 months ago  . Colonoscopy with propofol N/A 11/17/2014    Procedure: COLONOSCOPY WITH PROPOFOL;  Surgeon: Manus Gunning, MD;  Location: [facility];  Service: Gastroenterology;  Laterality: N/A;  . Colonoscopy with propofol N/A 01/05/2015    Procedure: COLONOSCOPY WITH PROPOFOL;  Surgeon: Manus Gunning, MD;  Location: [facility];  Service: Gastroenterology;  Laterality: N/A;    Family History  Problem Relation  Age of Onset  . Cancer Maternal Grandmother     unknown ype  . Allergies Mother   . Diabetes Mother   . Hypertension Mother   . Hyperlipidemia Mother   . Dementia Mother   . Stroke Father   . Allergies Father   . Dementia Father   . Alcohol abuse Father   . Diabetes Sister   . Heart disease Sister   . Anxiety disorder Sister   . Colon cancer Mother   . Colon polyps Sister   . Schizophrenia Paternal Uncle   . Schizophrenia Cousin   . Pancreatic cancer Sister   . Lung cancer Mother   . Kidney cancer Mother   . Colon polyps Father   . Colon polyps Mother     Social history:  reports that he has never smoked. His smokeless tobacco use includes Snuff. He reports that he does not drink alcohol or use illicit drugs.    Allergies  Allergen Reactions  . Ace Inhibitors Cough  . Beta Adrenergic Blockers Cough    Medications:  Prior to Admission medications   Medication Sig Start Date End Date Taking? Authorizing Provider  aspirin 325 MG tablet Take 325 mg by mouth daily.     Yes Historical Provider, MD  baclofen (LIORESAL) 10 MG tablet TAKE ONE TABLET BY MOUTH AT BEDTIME 04/06/15  Yes Kathrynn Ducking, MD  busPIRone (BUSPAR) 5 MG tablet Take 1 tablet (5 mg total) by mouth 3 (three) times daily. 01/22/15  Yes Cloria Spring, MD  Chromium Picolinate 200 MCG CAPS Take 200 mcg by mouth daily.    Yes Historical Provider, MD  clonazePAM (KLONOPIN) 1 MG tablet Take 1 tablet (1 mg total) by mouth 3 (three) times daily. 01/22/15 01/22/16 Yes Cloria Spring, MD  ezetimibe (ZETIA) 10 MG tablet Take 10 mg by mouth daily.   Yes Historical Provider, MD  FLUoxetine (PROZAC) 20 MG capsule Take 3 capsules (60 mg total) by mouth daily. 01/22/15  Yes Cloria Spring, MD  furosemide (LASIX) 40 MG tablet Take 40 mg by mouth daily.   Yes Historical Provider, MD  furosemide (LASIX) 40 MG tablet TAKE ONE TABLET BY MOUTH ONCE DAILY 04/05/15  Yes Evans Lance, MD  insulin glargine (LANTUS) 100 UNIT/ML  injection Inject 70 Units into the skin at bedtime.   Yes Historical Provider, MD  insulin lispro (HUMALOG) 100 UNIT/ML injection Inject 7-12 Units into the skin 3 (three) times daily before meals. Sliding scale 11/21/12  Yes [name], MD  losartan (COZAAR) 50 MG tablet Take 1 tablet (50 mg total) by mouth daily. 09/30/13  Yes [name], MD  methylcellulose (ARTIFICIAL TEARS) 1 % ophthalmic solution Place 1 drop into both eyes 2 (two) times  daily as needed (dry eyes).   Yes Historical Provider, MD  metoprolol tartrate (LOPRESSOR) 25 MG tablet TAKE ONE TABLET BY MOUTH TWICE DAILY 06/08/14  Yes [name], MD  Multiple Vitamin (MULTIVITAMIN) tablet Take 1 tablet by mouth daily.     Yes Historical Provider, MD  Na Sulfate-K Sulfate-Mg Sulf (SUPREP BOWEL PREP) SOLN Take 1 kit by mouth once. Name brand only, suprep as directed, no substitutions 12/24/14  Yes Manus Gunning, MD  naproxen sodium (ANAPROX) 220 MG tablet Take 220 mg by mouth 2 (two) times daily as needed (pain).    Yes Historical Provider, MD  nitroGLYCERIN (NITROSTAT) 0.4 MG SL tablet Place 1 tablet (0.4 mg total) under the tongue every 5 (five) minutes as needed for chest pain. 11/17/14 11/22/18 Yes Manus Gunning, MD  Omega-3 Fatty Acids (FISH OIL BURP-LESS PO) Take 1 tablet by mouth daily.     Yes Historical Provider, MD  Propylhexedrine Darcella Gasman) INHA Place 1 each into the nose 2 (two) times daily as needed (congestion).    Yes Historical Provider, MD  rosuvastatin (CRESTOR) 20 MG tablet Take 1 tablet (20 mg total) by mouth daily. 06/23/13  Yes [name], MD  gabapentin (NEURONTIN) 300 MG capsule Take 1 capsule (300 mg total) by mouth at bedtime. 04/22/15   Kathrynn Ducking, MD    ROS:  Out of a complete 14 system review of symptoms, the patient complains only of the following symptoms, and all other reviewed systems are negative.  Fatigue Runny nose Eye redness, light sensitivity,  double vision, loss of vision Cough, chest tightness Leg swelling Excessive thirst Restless legs, insomnia, sleep apnea, frequent waking, daytime sleepiness, snoring, acting out dreams Frequency of urination Joint pain, back pain, achy muscles, muscle cramps, walking difficulty, neck pain, neck stiffness Memory loss, dizziness, headache, numbness, weakness, tremors Depression, anxiety  Blood pressure 141/72, pulse 49, height 5' 9"  (1.753 m), weight 257 lb 8 oz (116.801 kg).  Physical Exam  General: The patient is alert and cooperative at the time of the examination. The patient is markedly obese.  Skin: 1+ edema of ankles is noted bilaterally.   Neurologic Exam  Mental status: The patient is alert and oriented x 3 at the time of the examination. The patient has apparent normal recent and remote memory, with an apparently normal attention span and concentration ability.   Cranial nerves: Facial symmetry is present. Speech is normal, no aphasia or dysarthria is noted. Extraocular movements are full. Visual fields are full.  Motor: The patient has good strength in all 4 extremities, with exception of weakness of the intrinsic muscles of the hands bilaterally, and bilateral foot drops are noted.  Sensory examination: Soft touch sensation is symmetric on the face, arms, and legs, but the patient does have a stocking pattern pinprick sensory deficit two thirds the way up the legs below the knees bilaterally.  Coordination: The patient has good finger-nose-finger and heel-to-shin bilaterally.  Gait and station: The patient has a normal gait, the patient uses a walking stick for ambulation. Tandem gait is unsteady. Romberg is negative. No drift is seen.  Reflexes: Deep tendon reflexes are symmetric, but are depressed.   Assessment/Plan:  1. Diabetic peripheral neuropathy  2. Bilateral foot drops  3. Gait disorder  4. Restless leg syndrome  The patient will be placed on  gabapentin taking 300 mg at night. He will continue the baclofen for the nocturnal leg cramps. He will be given another prescription for AFO  braces, and hopes that he can get light-weight carbon fiber braces that he might actually wear to help improve his balance and reduce falls. He will follow-up in 6 months.  Jill Alexanders MD 04/22/2015 5:27 PM  [facility] [address] [place], [place] [address]  Phone [phone] Fax [phone]

## 2015-04-23 ENCOUNTER — Ambulatory Visit (HOSPITAL_COMMUNITY): Payer: Self-pay | Admitting: Psychiatry

## 2015-04-28 ENCOUNTER — Telehealth: Payer: Self-pay | Admitting: Family Medicine

## 2015-04-28 DIAGNOSIS — E1065 Type 1 diabetes mellitus with hyperglycemia: Secondary | ICD-10-CM

## 2015-04-28 NOTE — Telephone Encounter (Signed)
Pls call pt and inquire if he ABSOLUTELY wants to change endo to closer Provider, Dr Francetta Found with [facility] who his mother is currently seeing, if so pls enter referral , dx is uncontrolled IDDM, i will sign, may need updated HBA1C , unsure , will discuss further with you

## 2015-04-30 ENCOUNTER — Ambulatory Visit (HOSPITAL_COMMUNITY): Payer: Self-pay | Admitting: Psychiatry

## 2015-04-30 NOTE — Addendum Note (Signed)
Addended by: Denman George B on: 04/30/2015 11:15 AM   Modules accepted: Orders

## 2015-04-30 NOTE — Telephone Encounter (Signed)
Patient aware.   Referral entered.   Will find out if updated a1c is needed with specialist

## 2015-05-03 ENCOUNTER — Ambulatory Visit (INDEPENDENT_AMBULATORY_CARE_PROVIDER_SITE_OTHER): Payer: Medicare HMO | Admitting: Psychiatry

## 2015-05-03 ENCOUNTER — Encounter (HOSPITAL_COMMUNITY): Payer: Self-pay | Admitting: Psychiatry

## 2015-05-03 VITALS — BP 155/80 | HR 58 | Ht 69.0 in | Wt 256.2 lb

## 2015-05-03 DIAGNOSIS — F411 Generalized anxiety disorder: Secondary | ICD-10-CM | POA: Diagnosis not present

## 2015-05-03 DIAGNOSIS — F418 Other specified anxiety disorders: Secondary | ICD-10-CM

## 2015-05-03 MED ORDER — CLONAZEPAM 1 MG PO TABS: 1.0000 mg | ORAL_TABLET | Freq: Three times a day (TID) | ORAL | Status: DC

## 2015-05-03 MED ORDER — BUSPIRONE HCL 10 MG PO TABS: 10.0000 mg | ORAL_TABLET | Freq: Three times a day (TID) | ORAL | Status: DC

## 2015-05-03 MED ORDER — FLUOXETINE HCL 20 MG PO CAPS: 60.0000 mg | ORAL_CAPSULE | Freq: Every day | ORAL | Status: DC
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
## 2015-05-09 ENCOUNTER — Encounter: Payer: Self-pay | Admitting: Cardiology

## 2015-05-09 LAB — CUP PACEART REMOTE DEVICE CHECK
Brady Statistic RA Percent Paced: 0 %
Brady Statistic RV Percent Paced: 0 %
Date Time Interrogation Session: 20170211013200
HIGH POWER IMPEDANCE MEASURED VALUE: 47 Ohm
Implantable Lead Implant Date: 20110706
Lead Channel Impedance Value: 426 Ohm
Lead Channel Impedance Value: 441 Ohm
Lead Channel Pacing Threshold Amplitude: 0.5 V
MDC IDC LEAD LOCATION: 753860
MDC IDC LEAD MODEL: 184
MDC IDC LEAD SERIAL: 310740
MDC IDC MSMT BATTERY REMAINING LONGEVITY: 84 mo
MDC IDC MSMT BATTERY REMAINING PERCENTAGE: 96 %
MDC IDC MSMT LEADCHNL RA PACING THRESHOLD PULSEWIDTH: 0.4 ms
MDC IDC MSMT LEADCHNL RV PACING THRESHOLD AMPLITUDE: 1 V
MDC IDC MSMT LEADCHNL RV PACING THRESHOLD PULSEWIDTH: 0.4 ms
MDC IDC SET LEADCHNL RA PACING AMPLITUDE: 2 V
MDC IDC SET LEADCHNL RV PACING AMPLITUDE: 2.4 V
MDC IDC SET LEADCHNL RV PACING PULSEWIDTH: 0.4 ms
MDC IDC SET LEADCHNL RV SENSING SENSITIVITY: 0.5 mV
Pulse Gen Serial Number: 168307

## 2015-05-09 NOTE — Progress Notes (Signed)
Normal remote reviewed.  Next [place] 07/20/15

## 2015-05-17 ENCOUNTER — Emergency Department
Admission: EM | Admit: 2015-05-17 | Discharge: 2015-05-17 | Disposition: A | Discharge status: Home / Self Care | Payer: Commercial Managed Care - HMO | Source: Home / Self Care | Attending: Family Medicine | Admitting: Family Medicine

## 2015-05-17 ENCOUNTER — Encounter: Payer: Self-pay | Admitting: *Deleted

## 2015-05-17 ENCOUNTER — Emergency Department (INDEPENDENT_AMBULATORY_CARE_PROVIDER_SITE_OTHER): Payer: Commercial Managed Care - HMO

## 2015-05-17 DIAGNOSIS — M79672 Pain in left foot: Secondary | ICD-10-CM | POA: Diagnosis not present

## 2015-05-17 DIAGNOSIS — M79662 Pain in left lower leg: Secondary | ICD-10-CM

## 2015-05-17 NOTE — Discharge Instructions (Signed)
Apply ice pack for 20 to 30 minutes, 3 to 4 times daily  Continue until pain decreases. Try stretching exercises.  Continue Aleve.  May add Tylenol as needed for pain.

## 2015-05-17 NOTE — ED Notes (Signed)
Pt c/o "needle like" sensations in the bottom of his LT foot x 2 days. He is unsure of any injuries. He reports neuropathy in both feet.

## 2015-05-17 NOTE — ED Provider Notes (Signed)
CSN: [id number]     Arrival date & time 05/17/15  1518 History   First MD Initiated Contact with Patient 05/17/15 1536     Chief Complaint  Patient presents with  . Foot Pain      HPI Comments: Patient complains of vague "needle-like" sensations in his left foot near the heel.  He recalls no injury or change in activities. He has a history of peripheral neuropathy.  Patient is a 50 y.o. male presenting with lower extremity pain. The history is provided by the patient.  Foot Pain This is a new problem. The current episode started 2 days ago. The problem occurs constantly. The problem has not changed since onset.Pertinent negatives include no chest pain and no shortness of breath. The symptoms are aggravated by walking. Nothing relieves the symptoms. He has tried nothing for the symptoms.    Past Medical History  Diagnosis Date  . Cardiac arrest - ventricular fibrillation 08/26/2009  . CAD (coronary artery disease)     a. OOH MI 6/11; presented with acute CHF; hosp course c/b VF arrest with VDRF, etc;   b s/p CABG: L-LAD/Dx, S-OM/dCFX;  c.Nuclear scan 8/12:  Inferior and inf-lat scar with minimal peri-infarct ischemia, EF 53%.; d.LHC 9/12:  LAD 90%, pD1 (small) 70%, prox large Dx 95%, L-LAD and Dx ok, CFX occluded, S-OM and CFX ok, RCA prox to prox/mid 60-70%.  Medical management     . Ischemic cardiomyopathy     echo 10/11:  inf-septal and apical HK, EF 35%, mod LAE  . Diabetic coma with ketoacidosis ([place])     Occured in 1994 where he spent 30 days in a coma . He develpoed infection of his leg and developing pancreatitis during that  time   . DM2 (diabetes mellitus, type 2) ([place])   . Cellulitis 2012    spider bite  . HTN (hypertension)   . HLD (hyperlipidemia)   . Chronic systolic heart failure ([place])   . Thyroid disease   . Coma ([place])   . Headache(784.0)     sinus  . Anxiety     stress  . CKD (chronic kidney disease)   . Neuropathy ([place])   . Carpal tunnel syndrome of right wrist     . 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 Abnormality of gait 04/08/2013  . 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 Foot drop, bilateral 04/08/2013  . Obesity   . Anxiety and depression   . Retinal detachment     Bilateral, laser surgery on the right  . Nocturnal leg cramps   . History of degenerative disc disease   . Hypercholesteremia   . Pancreatitis 2008  . Dysrhythmia   . AICD (automatic cardioverter/defibrillator) present   . Presence of permanent cardiac pacemaker   . RLS (restless legs syndrome) 04/22/2015   Past Surgical History  Procedure Laterality Date  . Coronary artery bypass grafting x4  june 29,2011    x 4  . Tonsillectomy  1998  . Appendectomy  mid 1990  . Right sided abdominal cyst removal    . Carpal tunnel with cubital tunnel  02/22/2012    Procedure: CARPAL TUNNEL WITH CUBITAL TUNNEL;  Surgeon: Roseanne Kaufman, MD;  Location: [facility];  Service: Orthopedics;  Laterality: Right;  Right Carpal Tunnel Release/Right Cubital Tunnel Release and Transposition if Necessary with Flexor Pronator Release  . Cardiac surgery    . Ulnar nerve transposition Right   . Retinal detachment surgery Bilateral   .  Pacemaker placement    . Cardiac defibrillator placement    . Coronary artery bypass graft    . Retinal detachment surgery Bilateral     3 months ago  . Colonoscopy with propofol N/A 11/17/2014    Procedure: COLONOSCOPY WITH PROPOFOL;  Surgeon: Manus Gunning, MD;  Location: [facility];  Service: Gastroenterology;  Laterality: N/A;  . Colonoscopy with propofol N/A 01/05/2015    Procedure: COLONOSCOPY WITH PROPOFOL;  Surgeon: Manus Gunning, MD;  Location: [facility];  Service: Gastroenterology;  Laterality: N/A;   Family History  Problem Relation Age of Onset  . Cancer Maternal Grandmother     unknown ype  . Allergies Mother   . Diabetes Mother   . Hypertension Mother   . Hyperlipidemia Mother   . Dementia Mother   . Stroke Father   .  Allergies Father   . Dementia Father   . Alcohol abuse Father   . Diabetes Sister   . Heart disease Sister   . Anxiety disorder Sister   . Colon cancer Mother   . Colon polyps Sister   . Schizophrenia Paternal Uncle   . Schizophrenia Cousin   . Pancreatic cancer Sister   . Lung cancer Mother   . Kidney cancer Mother   . Colon polyps Father   . Colon polyps Mother    Social History  Substance Use Topics  . Smoking status: Never Smoker   . Smokeless tobacco: Current User    Types: Snuff  . Alcohol Use: No     Comment: rarely    Review of Systems  Respiratory: Negative for shortness of breath.   Cardiovascular: Negative for chest pain.  All other systems reviewed and are negative.   Allergies  Ace inhibitors and Beta adrenergic blockers  Home Medications   Prior to Admission medications   Medication Sig Start Date End Date Taking? Authorizing Provider  aspirin 325 MG tablet Take 325 mg by mouth daily.      Historical Provider, MD  baclofen (LIORESAL) 10 MG tablet TAKE ONE TABLET BY MOUTH AT BEDTIME 04/06/15   Kathrynn Ducking, MD  busPIRone (BUSPAR) 10 MG tablet Take 1 tablet (10 mg total) by mouth 3 (three) times daily. 05/03/15   Cloria Spring, MD  Chromium Picolinate 200 MCG CAPS Take 200 mcg by mouth daily.     Historical Provider, MD  clonazePAM (KLONOPIN) 1 MG tablet Take 1 tablet (1 mg total) by mouth 3 (three) times daily. 05/03/15 05/02/16  Cloria Spring, MD  ezetimibe (ZETIA) 10 MG tablet Take 10 mg by mouth daily.    Historical Provider, MD  FLUoxetine (PROZAC) 20 MG capsule Take 3 capsules (60 mg total) by mouth daily. 05/03/15   Cloria Spring, MD  furosemide (LASIX) 40 MG tablet Take 40 mg by mouth daily.    Historical Provider, MD  furosemide (LASIX) 40 MG tablet TAKE ONE TABLET BY MOUTH ONCE DAILY 04/05/15   Evans Lance, MD  gabapentin (NEURONTIN) 300 MG capsule Take 1 capsule (300 mg total) by mouth at bedtime. 04/22/15   Kathrynn Ducking, MD  insulin  glargine (LANTUS) 100 UNIT/ML injection Inject 70 Units into the skin at bedtime.    Historical Provider, MD  insulin lispro (HUMALOG) 100 UNIT/ML injection Inject 7-12 Units into the skin 3 (three) times daily before meals. Sliding scale 11/21/12   [name], MD  losartan (COZAAR) 50 MG tablet Take 1 tablet (50 mg total) by mouth daily. 09/30/13  [name], MD  methylcellulose (ARTIFICIAL TEARS) 1 % ophthalmic solution Place 1 drop into both eyes 2 (two) times daily as needed (dry eyes).    Historical Provider, MD  metoprolol tartrate (LOPRESSOR) 25 MG tablet TAKE ONE TABLET BY MOUTH TWICE DAILY 06/08/14   [name], MD  Multiple Vitamin (MULTIVITAMIN) tablet Take 1 tablet by mouth daily.      Historical Provider, MD  Na Sulfate-K Sulfate-Mg Sulf (SUPREP BOWEL PREP) SOLN Take 1 kit by mouth once. Name brand only, suprep as directed, no substitutions 12/24/14   Manus Gunning, MD  naproxen sodium (ANAPROX) 220 MG tablet Take 220 mg by mouth 2 (two) times daily as needed (pain).     Historical Provider, MD  nitroGLYCERIN (NITROSTAT) 0.4 MG SL tablet Place 1 tablet (0.4 mg total) under the tongue every 5 (five) minutes as needed for chest pain. 11/17/14 11/22/18  Manus Gunning, MD  Omega-3 Fatty Acids (FISH OIL BURP-LESS PO) Take 1 tablet by mouth daily.      Historical Provider, MD  Propylhexedrine Darcella Gasman) INHA Place 1 each into the nose 2 (two) times daily as needed (congestion).     Historical Provider, MD  rosuvastatin (CRESTOR) 20 MG tablet Take 1 tablet (20 mg total) by mouth daily. 06/23/13   [name], MD   Meds Ordered and Administered this Visit  Medications - No data to display  BP 162/93 mmHg  Pulse 68  Temp(Src) 98.1 F (36.7 C) (Oral)  Resp 18  Ht 5' 9"  (1.753 m)  Wt 252 lb (114.306 kg)  BMI 37.20 kg/m2  SpO2 99% No data found.   Physical Exam  Constitutional: He is oriented to person, place, and time. He appears  well-developed and well-nourished. No distress.  Patient is obese (BMI 37.2)  HENT:  Head: Atraumatic.  Eyes: Pupils are equal, round, and reactive to light.  Cardiovascular: Normal heart sounds.   Pulmonary/Chest: Breath sounds normal.  Abdominal: There is no tenderness.  Musculoskeletal: He exhibits no edema.       Left lower leg: He exhibits tenderness. He exhibits no bony tenderness, no swelling and no edema.       Legs:      Left foot: There is tenderness and bony tenderness. There is normal range of motion, no swelling and normal capillary refill.       Feet:  Left foot has vague tenderness to palpation over medial and lateral calcaneus.  Pedal pulses are normal.  No erythema or warmth.  No swelling.   There is tenderness to palpation over the anterior compartment of the lower leg.  No calf tenderness.  Neurological: He is alert and oriented to person, place, and time.  Skin: Skin is warm and dry. No rash noted.  Nursing note and vitals reviewed.   ED Course  Procedures  None   Imaging Review Dg Foot Complete Left  05/17/2015  CLINICAL DATA:  Left foot pain for 2 days intermittent both medially and laterally EXAM: LEFT FOOT - COMPLETE 3+ VIEW COMPARISON:  None. FINDINGS: Diffuse small vessel calcification. No fracture or dislocation. No evidence of osteomyelitis. No significant degenerative change. IMPRESSION: Small vessel calcification.  Otherwise negative. Electronically Signed   By: Skipper Cliche M.D.   On: 05/17/2015 16:19      MDM   1. Left foot pain; suspect plantar fasciitis   2. Pain in left shin; suspect shin splints     Apply ice pack for 20 to 30 minutes,  3 to 4 times daily  Continue until pain decreases. Try stretching exercises.  Continue Aleve.  May add Tylenol as needed for pain. Followup with Dr. Aundria Mems or Dr. Lynne Leader ([facility]) if not improving about two weeks.     Kandra Nicolas, MD 05/25/15 [phone]

## 2015-06-02 ENCOUNTER — Ambulatory Visit (INDEPENDENT_AMBULATORY_CARE_PROVIDER_SITE_OTHER): Payer: Commercial Managed Care - HMO

## 2015-06-02 DIAGNOSIS — Z23 Encounter for immunization: Secondary | ICD-10-CM

## 2015-07-20 ENCOUNTER — Telehealth: Payer: Self-pay | Admitting: Cardiology

## 2015-07-20 ENCOUNTER — Ambulatory Visit (INDEPENDENT_AMBULATORY_CARE_PROVIDER_SITE_OTHER): Payer: Commercial Managed Care - HMO | Admitting: *Deleted

## 2015-07-20 DIAGNOSIS — I472 Ventricular tachycardia: Secondary | ICD-10-CM | POA: Diagnosis not present

## 2015-07-20 DIAGNOSIS — Z95810 Presence of automatic (implantable) cardiac defibrillator: Secondary | ICD-10-CM

## 2015-07-20 DIAGNOSIS — I4729 Other ventricular tachycardia: Secondary | ICD-10-CM

## 2015-07-20 NOTE — Telephone Encounter (Signed)
Spoke with pt and reminded pt of remote transmission that is due today. Pt verbalized understanding.   

## 2015-07-20 NOTE — Progress Notes (Signed)
Remote ICD transmission.   

## 2015-07-30 ENCOUNTER — Ambulatory Visit (HOSPITAL_COMMUNITY): Payer: Self-pay | Admitting: Psychiatry

## 2015-08-12 LAB — CUP PACEART REMOTE DEVICE CHECK
Battery Remaining Percentage: 87 %
Brady Statistic RA Percent Paced: 0 %
Brady Statistic RV Percent Paced: 0 %
Date Time Interrogation Session: 20170515040300
HIGH POWER IMPEDANCE MEASURED VALUE: 45 Ohm
Implantable Lead Implant Date: 20110706
Implantable Lead Model: 184
Lead Channel Impedance Value: 418 Ohm
Lead Channel Impedance Value: 428 Ohm
Lead Channel Pacing Threshold Amplitude: 0.5 V
MDC IDC LEAD LOCATION: 753860
MDC IDC LEAD SERIAL: 310740
MDC IDC MSMT BATTERY REMAINING LONGEVITY: 72 mo
MDC IDC MSMT LEADCHNL RA PACING THRESHOLD PULSEWIDTH: 0.4 ms
MDC IDC MSMT LEADCHNL RV PACING THRESHOLD AMPLITUDE: 1 V
MDC IDC MSMT LEADCHNL RV PACING THRESHOLD PULSEWIDTH: 0.4 ms
MDC IDC SET LEADCHNL RA PACING AMPLITUDE: 2 V
MDC IDC SET LEADCHNL RV PACING AMPLITUDE: 2.4 V
MDC IDC SET LEADCHNL RV PACING PULSEWIDTH: 0.4 ms
MDC IDC SET LEADCHNL RV SENSING SENSITIVITY: 0.5 mV
Pulse Gen Serial Number: 168307

## 2015-08-17 ENCOUNTER — Encounter (HOSPITAL_COMMUNITY): Payer: Self-pay | Admitting: Psychiatry

## 2015-08-17 ENCOUNTER — Ambulatory Visit (INDEPENDENT_AMBULATORY_CARE_PROVIDER_SITE_OTHER): Payer: Medicare HMO | Admitting: Psychiatry

## 2015-08-17 ENCOUNTER — Ambulatory Visit (HOSPITAL_COMMUNITY): Payer: Self-pay | Admitting: Psychiatry

## 2015-08-17 VITALS — BP 149/76 | HR 74 | Ht 69.0 in | Wt 240.0 lb

## 2015-08-17 DIAGNOSIS — F4001 Agoraphobia with panic disorder: Secondary | ICD-10-CM

## 2015-08-17 DIAGNOSIS — F418 Other specified anxiety disorders: Secondary | ICD-10-CM

## 2015-08-17 DIAGNOSIS — F411 Generalized anxiety disorder: Secondary | ICD-10-CM | POA: Diagnosis not present

## 2015-08-17 LAB — HEMOGLOBIN A1C: HEMOGLOBIN A1C: 6.2

## 2015-08-17 MED ORDER — BUSPIRONE HCL 10 MG PO TABS
10.0000 mg | ORAL_TABLET | Freq: Three times a day (TID) | ORAL | Status: DC
Start: 2015-08-17 — End: 2015-12-06

## 2015-08-17 MED ORDER — FLUOXETINE HCL 20 MG PO CAPS: 60.0000 mg | ORAL_CAPSULE | Freq: Every day | ORAL | Status: DC

## 2015-08-17 MED ORDER — CLONAZEPAM 1 MG PO TABS: 1.0000 mg | ORAL_TABLET | Freq: Three times a day (TID) | ORAL | Status: DC
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
## 2015-08-19 ENCOUNTER — Encounter: Payer: Self-pay | Admitting: Cardiology

## 2015-08-24 ENCOUNTER — Encounter: Payer: Self-pay | Admitting: Family Medicine

## 2015-09-17 ENCOUNTER — Emergency Department (INDEPENDENT_AMBULATORY_CARE_PROVIDER_SITE_OTHER)
Admission: EM | Admit: 2015-09-17 | Discharge: 2015-09-17 | Disposition: A | Discharge status: Home / Self Care | Payer: Commercial Managed Care - HMO | Source: Home / Self Care | Attending: Family Medicine | Admitting: Family Medicine

## 2015-09-17 DIAGNOSIS — M545 Low back pain, unspecified: Secondary | ICD-10-CM

## 2015-09-17 DIAGNOSIS — G8929 Other chronic pain: Secondary | ICD-10-CM

## 2015-09-17 MED ORDER — HYDROCODONE-ACETAMINOPHEN 5-325 MG PO TABS: 1.0000 | ORAL_TABLET | Freq: Four times a day (QID) | ORAL | Status: DC | PRN

## 2015-09-17 MED ORDER — PREDNISONE 20 MG PO TABS: ORAL_TABLET | ORAL | Status: DC

## 2015-09-17 MED ORDER — CYCLOBENZAPRINE HCL 10 MG PO TABS: 10.0000 mg | ORAL_TABLET | Freq: Two times a day (BID) | ORAL | Status: DC | PRN

## 2015-09-17 NOTE — ED Notes (Signed)
Pt has had same discomfort previously, that normally resolves.  This has been going on for over a week.  He describes it as a strong irritant, and that he is always in pain due to his degenerative disc disease.  He states that it is difficult to stand straight up, and the pain is in the right hip and lower back.  He stares that it radiates, and at times is stiff, especially in the am.  Hurts when lifting his right leg, but stretching seems to relieve the discomfort.

## 2015-09-17 NOTE — Discharge Instructions (Signed)
° °  Norco/Vicodin (hydrocodone-acetaminophen) is a narcotic pain medication, do not combine these medications with others containing tylenol. While taking, do not drink alcohol, drive, or perform any other activities that requires focus while taking these medications.   Flexeril is a muscle relaxer and may cause drowsiness. Do not drink alcohol, drive, or operate heavy machinery while taking.

## 2015-09-17 NOTE — ED Provider Notes (Signed)
CSN: [id number]     Arrival date & time 09/17/15  1118 History   First MD Initiated Contact with Patient 09/17/15 1125     Chief Complaint  Patient presents with  . Back Pain   (Consider location/radiation/quality/duration/timing/severity/associated sxs/prior Treatment) HPI  Matthew [name] is a 50 y.o. male presenting to UC with c/o exacerbation of Right lower back pain of the last 1 week.  He has been taking Aleve, using topical muscle rubs, and doing stretches at home with only minimal relief. Pain in Right lower back is aching and sharp, worse with movement of his Right leg, occasionally radiates down Right leg. Pain is worse in the morning as pt notes he feels "stiff."  Hx of DDD but has not had back surgery. Hx of sciatica.  He uses a cane to help with ambulation. Pain typically resolves within a few days but it has not been improving this time.  Denies fever, chills, numbness, dysuria or hematuria. Denies recent heavy lifting, falls or injuries.       Past Medical History  Diagnosis Date  . Cardiac arrest - ventricular fibrillation 08/26/2009  . CAD (coronary artery disease)     a. OOH MI 6/11; presented with acute CHF; hosp course c/b VF arrest with VDRF, etc;   b s/p CABG: L-LAD/Dx, S-OM/dCFX;  c.Nuclear scan 8/12:  Inferior and inf-lat scar with minimal peri-infarct ischemia, EF 53%.; d.LHC 9/12:  LAD 90%, pD1 (small) 70%, prox large Dx 95%, L-LAD and Dx ok, CFX occluded, S-OM and CFX ok, RCA prox to prox/mid 60-70%.  Medical management     . Ischemic cardiomyopathy     echo 10/11:  inf-septal and apical HK, EF 35%, mod LAE  . Diabetic coma with ketoacidosis ([place])     Occured in 1994 where he spent 30 days in a coma . He develpoed infection of his leg and developing pancreatitis during that  time   . DM2 (diabetes mellitus, type 2) ([place])   . Cellulitis 2012    spider bite  . HTN (hypertension)   . HLD (hyperlipidemia)   . Chronic systolic heart failure ([place])   . Thyroid disease   .  Coma ([place])   . Headache(784.0)     sinus  . Anxiety     stress  . CKD (chronic kidney disease)   . Neuropathy ([place])   . Carpal tunnel syndrome of right wrist   . 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 Abnormality of gait 04/08/2013  . 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 Foot drop, bilateral 04/08/2013  . Obesity   . Anxiety and depression   . Retinal detachment     Bilateral, laser surgery on the right  . Nocturnal leg cramps   . History of degenerative disc disease   . Hypercholesteremia   . Pancreatitis 2008  . Dysrhythmia   . AICD (automatic cardioverter/defibrillator) present   . Presence of permanent cardiac pacemaker   . RLS (restless legs syndrome) 04/22/2015   Past Surgical History  Procedure Laterality Date  . Coronary artery bypass grafting x4  june 29,2011    x 4  . Tonsillectomy  1998  . Appendectomy  mid 1990  . Right sided abdominal cyst removal    . Carpal tunnel with cubital tunnel  02/22/2012    Procedure: CARPAL TUNNEL WITH CUBITAL TUNNEL;  Surgeon: Roseanne Kaufman, MD;  Location: [facility];  Service: Orthopedics;  Laterality: Right;  Right Carpal Tunnel Release/Right Cubital Tunnel Release  and Transposition if Necessary with Flexor Pronator Release  . Cardiac surgery    . Ulnar nerve transposition Right   . Retinal detachment surgery Bilateral   . Pacemaker placement    . Cardiac defibrillator placement    . Coronary artery bypass graft    . Retinal detachment surgery Bilateral     3 months ago  . Colonoscopy with propofol N/A 11/17/2014    Procedure: COLONOSCOPY WITH PROPOFOL;  Surgeon: Manus Gunning, MD;  Location: [facility];  Service: Gastroenterology;  Laterality: N/A;  . Colonoscopy with propofol N/A 01/05/2015    Procedure: COLONOSCOPY WITH PROPOFOL;  Surgeon: Manus Gunning, MD;  Location: [facility];  Service: Gastroenterology;  Laterality: N/A;   Family History  Problem Relation Age of Onset  . Cancer Maternal  Grandmother     unknown ype  . Allergies Mother   . Diabetes Mother   . Hypertension Mother   . Hyperlipidemia Mother   . Dementia Mother   . Stroke Father   . Allergies Father   . Dementia Father   . Alcohol abuse Father   . Diabetes Sister   . Heart disease Sister   . Anxiety disorder Sister   . Colon cancer Mother   . Colon polyps Sister   . Schizophrenia Paternal Uncle   . Schizophrenia Cousin   . Pancreatic cancer Sister   . Lung cancer Mother   . Kidney cancer Mother   . Colon polyps Father   . Colon polyps Mother    Social History  Substance Use Topics  . Smoking status: Never Smoker   . Smokeless tobacco: Current User    Types: Snuff  . Alcohol Use: No     Comment: rarely    Review of Systems  Constitutional: Negative for fever and chills.  Gastrointestinal: Negative for nausea and vomiting.  Musculoskeletal: Positive for myalgias, back pain, arthralgias and gait problem. Negative for joint swelling.  Skin: Negative for color change, rash and wound.  Neurological: Negative for weakness and numbness.    Allergies  Ace inhibitors and Beta adrenergic blockers  Home Medications   Prior to Admission medications   Medication Sig Start Date End Date Taking? Authorizing Provider  aspirin 325 MG tablet Take 325 mg by mouth daily.      Historical Provider, MD  baclofen (LIORESAL) 10 MG tablet TAKE ONE TABLET BY MOUTH AT BEDTIME 04/06/15   Kathrynn Ducking, MD  busPIRone (BUSPAR) 10 MG tablet Take 1 tablet (10 mg total) by mouth 3 (three) times daily. 08/17/15   Cloria Spring, MD  Chromium Picolinate 200 MCG CAPS Take 200 mcg by mouth daily.     Historical Provider, MD  clonazePAM (KLONOPIN) 1 MG tablet Take 1 tablet (1 mg total) by mouth 3 (three) times daily. 08/17/15 08/16/16  Cloria Spring, MD  cyclobenzaprine (FLEXERIL) 10 MG tablet Take 1 tablet (10 mg total) by mouth 2 (two) times daily as needed for muscle spasms. 09/17/15   Noland Fordyce, PA-C  ezetimibe  (ZETIA) 10 MG tablet Take 10 mg by mouth daily.    Historical Provider, MD  FLUoxetine (PROZAC) 20 MG capsule Take 3 capsules (60 mg total) by mouth daily. 08/17/15   Cloria Spring, MD  furosemide (LASIX) 40 MG tablet Take 40 mg by mouth daily.    Historical Provider, MD  furosemide (LASIX) 40 MG tablet TAKE ONE TABLET BY MOUTH ONCE DAILY 04/05/15   Evans Lance, MD  gabapentin (NEURONTIN) 300 MG  capsule Take 1 capsule (300 mg total) by mouth at bedtime. 04/22/15   Kathrynn Ducking, MD  HYDROcodone-acetaminophen (NORCO/VICODIN) 5-325 MG tablet Take 1 tablet by mouth every 6 (six) hours as needed for moderate pain or severe pain. 09/17/15   Noland Fordyce, PA-C  insulin glargine (LANTUS) 100 UNIT/ML injection Inject 70 Units into the skin at bedtime.    Historical Provider, MD  insulin lispro (HUMALOG) 100 UNIT/ML injection Inject 7-12 Units into the skin 3 (three) times daily before meals. Sliding scale 11/21/12   [name], MD  losartan (COZAAR) 50 MG tablet Take 1 tablet (50 mg total) by mouth daily. 09/30/13   [name], MD  methylcellulose (ARTIFICIAL TEARS) 1 % ophthalmic solution Place 1 drop into both eyes 2 (two) times daily as needed (dry eyes).    Historical Provider, MD  metoprolol tartrate (LOPRESSOR) 25 MG tablet TAKE ONE TABLET BY MOUTH TWICE DAILY 06/08/14   [name], MD  Multiple Vitamin (MULTIVITAMIN) tablet Take 1 tablet by mouth daily.      Historical Provider, MD  Na Sulfate-K Sulfate-Mg Sulf (SUPREP BOWEL PREP) SOLN Take 1 kit by mouth once. Name brand only, suprep as directed, no substitutions 12/24/14   Manus Gunning, MD  naproxen sodium (ANAPROX) 220 MG tablet Take 220 mg by mouth 2 (two) times daily as needed (pain).     Historical Provider, MD  nitroGLYCERIN (NITROSTAT) 0.4 MG SL tablet Place 1 tablet (0.4 mg total) under the tongue every 5 (five) minutes as needed for chest pain. 11/17/14 11/22/18  Manus Gunning, MD  Omega-3 Fatty Acids  (FISH OIL BURP-LESS PO) Take 1 tablet by mouth daily.      Historical Provider, MD  predniSONE (DELTASONE) 20 MG tablet 3 tabs po day one, then 2 po daily x 4 days 09/17/15   Noland Fordyce, PA-C  Propylhexedrine [facility]) INHA Place 1 each into the nose 2 (two) times daily as needed (congestion).     Historical Provider, MD  rosuvastatin (CRESTOR) 20 MG tablet Take 1 tablet (20 mg total) by mouth daily. 06/23/13   [name], MD   Meds Ordered and Administered this Visit  Medications - No data to display  BP 126/77 mmHg  Pulse 75  Temp(Src) 98.2 F (36.8 C) (Oral)  Ht 5' 8.5" (1.74 m)  Wt 243 lb 8 oz (110.451 kg)  BMI 36.48 kg/m2  SpO2 99% No data found.   Physical Exam  Constitutional: He is oriented to person, place, and time. He appears well-developed and well-nourished.  HENT:  Head: Normocephalic and atraumatic.  Eyes: EOM are normal.  Neck: Normal range of motion.  Cardiovascular: Normal rate.   Pulmonary/Chest: Effort normal.  Musculoskeletal: He exhibits tenderness. He exhibits no edema.  No midline spinal tenderness, tenderness to Right lower lumbar muscles. Increased pain with movement of Right leg at hip joint.  No bony tenderness in hip. No crepitus.  Full ROM Right knee and ankle.   Neurological: He is alert and oriented to person, place, and time.  Uses cane to ambulate, antalgic gait.  Skin: Skin is warm and dry. No rash noted. No erythema.  Psychiatric: He has a normal mood and affect. His behavior is normal.  Nursing note and vitals reviewed.   ED Course  Procedures (including critical care time)  Labs Review Labs Reviewed - No data to display  Imaging Review No results found.     MDM   1. Right-sided low back pain  without sciatica   2. Acute exacerbation of chronic low back pain    Pt c/o exacerbation of lower back pain.  Tenderness to lower lumbar muscles. No hx of injury. No red flag symptoms. No indication for imaging at this  time.  Rx: norco (8 tabs), flexeril, and prednisone. Encouraged to monitor CBGs, pt understands as he has had prednisone in the past.  Patient counseled on use of narcotic pain medications. Counseled not to combine these medications with others containing tylenol. Urged not to drink alcohol, drive, or perform any other activities that requires focus while taking these medications.   F/u with PCP in 1 week if not improving, f/u with Sports Medicine if back pain continues to worsen. Patient verbalized understanding and agreement with treatment plan.     Noland Fordyce, PA-C 09/17/15 1211

## 2015-09-18 ENCOUNTER — Telehealth: Payer: Self-pay | Admitting: Emergency Medicine

## 2015-09-18 NOTE — Telephone Encounter (Signed)
Not available telephone number disconnected.

## 2015-09-24 ENCOUNTER — Other Ambulatory Visit: Payer: Self-pay | Admitting: Neurology

## 2015-09-27 ENCOUNTER — Other Ambulatory Visit: Payer: Self-pay

## 2015-09-27 MED ORDER — CYCLOBENZAPRINE HCL 10 MG PO TABS: 10.0000 mg | ORAL_TABLET | Freq: Two times a day (BID) | ORAL | 0 refills | Status: DC | PRN

## 2015-10-01 ENCOUNTER — Encounter: Payer: Self-pay | Admitting: Family Medicine

## 2015-10-01 ENCOUNTER — Ambulatory Visit (INDEPENDENT_AMBULATORY_CARE_PROVIDER_SITE_OTHER): Payer: Commercial Managed Care - HMO | Admitting: Family Medicine

## 2015-10-01 DIAGNOSIS — M7061 Trochanteric bursitis, right hip: Secondary | ICD-10-CM | POA: Diagnosis not present

## 2015-10-01 NOTE — Patient Instructions (Signed)
Thank you for coming in today. Return in 1 month.  Add the exercises  --- Side leg raises --- Side leg raises back 30 --- Side leg arcs --- Front step up and slow step downs --- Standing hip stretching  Aim to do about 30 repetitions of each exercise once or twice daily on both sides   Trochanteric Bursitis You have hip pain due to trochanteric bursitis. Bursitis means that the sack near the outside of the hip is filled with fluid and inflamed. This sack is made up of protective soft tissue. The pain from trochanteric bursitis can be severe and keep you from sleep. It can radiate to the buttocks or down the outside of the thigh to the knee. The pain is almost always worse when rising from the seated or lying position and with walking. Pain can improve after you take a few steps. It happens more often in people with hip joint and lumbar spine problems, such as arthritis or previous surgery. Very rarely the trochanteric bursa can become infected, and antibiotics and/or surgery may be needed. Treatment often includes an injection of local anesthetic mixed with cortisone medicine. This medicine is injected into the area where it is most tender over the hip. Repeat injections may be necessary if the response to treatment is slow. You can apply ice packs over the tender area for 30 minutes every 2 hours for the next few days. Anti-inflammatory and/or narcotic pain medicine may also be helpful. Limit your activity for the next few days if the pain continues. See your caregiver in 5-10 days if you are not greatly improved.  SEEK IMMEDIATE MEDICAL CARE IF:  You develop severe pain, fever, or increased redness.  You have pain that radiates below the knee. EXERCISES STRETCHING EXERCISES - Trochanteric Bursitis  These exercises may help you when beginning to rehabilitate your injury. Your symptoms may resolve with or without further involvement from your physician, physical therapist, or athletic  trainer. While completing these exercises, remember:   Restoring tissue flexibility helps normal motion to return to the joints. This allows healthier, less painful movement and activity.  An effective stretch should be held for at least 30 seconds.  A stretch should never be painful. You should only feel a gentle lengthening or release in the stretched tissue. STRETCH - Iliotibial Band  On the floor or bed, lie on your side so your injured leg is on top. Bend your knee and grab your ankle.  Slowly bring your knee back so that your thigh is in line with your trunk. Keep your heel at your buttocks and gently arch your back so your head, shoulders and hips line up.  Slowly lower your leg so that your knee approaches the floor/bed until you feel a gentle stretch on the outside of your thigh. If you do not feel a stretch and your knee will not fall farther, place the heel of your opposite foot on top of your knee and pull your thigh down farther.  Hold this stretch for __________ seconds.  Repeat __________ times. Complete this exercise __________ times per day. STRETCH - Hamstrings, Supine   Lie on your back. Loop a belt or towel over the ball of your foot as shown.  Straighten your knee and slowly pull on the belt to raise your injured leg. Do not allow the knee to bend. Keep your opposite leg flat on the floor.  Raise the leg until you feel a gentle stretch behind your knee or thigh.  Hold this position for __________ seconds.  Repeat __________ times. Complete this stretch __________ times per day. STRETCH - Quadriceps, Prone   Lie on your stomach on a firm surface, such as a bed or padded floor.  Bend your knee and grasp your ankle. If you are unable to reach your ankle or pant leg, use a belt around your foot to lengthen your reach.  Gently pull your heel toward your buttocks. Your knee should not slide out to the side. You should feel a stretch in the front of your thigh and/or  knee.  Hold this position for __________ seconds.  Repeat __________ times. Complete this stretch __________ times per day. STRETCHING - Hip Flexors, Lunge Half kneel with your knee on the floor and your opposite knee bent and directly over your ankle.  Keep good posture with your head over your shoulders. Tighten your buttocks to point your tailbone downward; this will prevent your back from arching too much.  You should feel a gentle stretch in the front of your thigh and/or hip. If you do not feel any resistance, slightly slide your opposite foot forward and then slowly lunge forward so your knee once again lines up over your ankle. Be sure your tailbone remains pointed downward.  Hold this stretch for __________ seconds.  Repeat __________ times. Complete this stretch __________ times per day. STRETCH - Adductors, Lunge  While standing, spread your legs.  Lean away from your injured leg by bending your opposite knee. You may rest your hands on your thigh for balance.  You should feel a stretch in your inner thigh. Hold for __________ seconds.  Repeat __________ times. Complete this exercise __________ times per day.   This information is not intended to replace advice given to you by your health care provider. Make sure you discuss any questions you have with your health care provider.   Document Released: 03/30/2004 Document Revised: 07/07/2014 Document Reviewed: 06/04/2008 Elsevier Interactive Patient Education [employer].

## 2015-10-01 NOTE — Progress Notes (Signed)
   Subjective:    I'm seeing this patient as a consultation for:  Noland Fordyce PA-C Tula Nakayama, MD   CC:  Right buttocks pain  HPI:  Patient has A history of chronic low back pain due to degenerative disc disease. However in the last 3 weeks his pain has worsened dramatically and is now located in the right lateral hip. Pain is worse with standing from a seating position, lying on the right side, and activity. He denies any radiating pain weakness or numbness fevers or chills. He denies any injury. He has tried some over-the-counter medicines for pain which felt a little. He was seen in urgent care and prescribed hydrocodone which helped a little as well.   Past medical history, Surgical history, Family history not pertinant except as noted below, Social history, Allergies, and medications have been entered into the medical record, reviewed, and no changes needed.   Review of Systems: No headache, visual changes, nausea, vomiting, diarrhea, constipation, dizziness, abdominal pain, skin rash, fevers, chills, night sweats, weight loss, swollen lymph nodes, body aches, joint swelling, muscle aches, chest pain, shortness of breath, mood changes, visual or auditory hallucinations.   Objective:    Vitals:   10/01/15 1021  BP: 115/72  Pulse: 81   General: Well Developed, well nourished, and in no acute distress.  Neuro/Psych: Alert and oriented x3, extra-ocular muscles intact, able to move all 4 extremities, sensation grossly intact. Skin: Warm and dry, no rashes noted.  Respiratory: Not using accessory muscles, speaking in full sentences, trachea midline.  Cardiovascular: Pulses palpable, no extremity edema. Abdomen: Does not appear distended. MSK:  Lspine: Normal appearing. Non-tender to spinal midline.  Mild TTP right SI joint.  Normal back motion. Right hip normal-appearing. Normal motion. Tender palpation greater trochanter and the insertion of the gluteus medius  muscle. Strength is reduced 4/5 to hip abduction. Antalgic gait. Decreased sensation bilateral lower extremities  No results found for this or any previous visit (from the past 24 hour(s)). No results found.  Impression and Recommendations:    Assessment and Plan: 50 y.o. male with Right hip pain almost certainly due to greater trochanter bursitis or gluteus medius insertional tendinitis. Plan to treat with home exercise program focusing on hip abduction strengthening. Recheck in one month..   Discussed warning signs or symptoms. Please see discharge instructions. Patient expresses understanding.

## 2015-10-19 ENCOUNTER — Ambulatory Visit (INDEPENDENT_AMBULATORY_CARE_PROVIDER_SITE_OTHER): Payer: Commercial Managed Care - HMO | Admitting: *Deleted

## 2015-10-19 DIAGNOSIS — I472 Ventricular tachycardia: Secondary | ICD-10-CM

## 2015-10-19 DIAGNOSIS — Z95810 Presence of automatic (implantable) cardiac defibrillator: Secondary | ICD-10-CM

## 2015-10-19 DIAGNOSIS — I4729 Other ventricular tachycardia: Secondary | ICD-10-CM

## 2015-10-19 NOTE — Progress Notes (Signed)
Remote ICD transmission.   

## 2015-10-20 ENCOUNTER — Ambulatory Visit: Payer: Commercial Managed Care - HMO | Admitting: Neurology

## 2015-10-20 ENCOUNTER — Encounter: Payer: Self-pay | Admitting: Cardiology

## 2015-10-26 LAB — CUP PACEART REMOTE DEVICE CHECK
Battery Remaining Percentage: 82 %
HighPow Impedance: 46 Ohm
Implantable Lead Implant Date: 20110706
Lead Channel Impedance Value: 418 Ohm
Lead Channel Setting Pacing Amplitude: 2.4 V
Lead Channel Setting Pacing Pulse Width: 0.4 ms
MDC IDC LEAD LOCATION: 753860
MDC IDC LEAD MODEL: 184
MDC IDC LEAD SERIAL: 310740
MDC IDC MSMT BATTERY REMAINING LONGEVITY: 72 mo
MDC IDC MSMT LEADCHNL RV IMPEDANCE VALUE: 426 Ohm
MDC IDC PG SERIAL: 168307
MDC IDC SESS DTM: 20170815111900
MDC IDC SET LEADCHNL RA PACING AMPLITUDE: 2 V
MDC IDC SET LEADCHNL RV SENSING SENSITIVITY: 0.5 mV
MDC IDC STAT BRADY RA PERCENT PACED: 0 %
MDC IDC STAT BRADY RV PERCENT PACED: 0 %

## 2015-10-29 ENCOUNTER — Ambulatory Visit: Payer: Self-pay | Admitting: Family Medicine

## 2015-11-01 ENCOUNTER — Ambulatory Visit (INDEPENDENT_AMBULATORY_CARE_PROVIDER_SITE_OTHER): Payer: Self-pay | Admitting: Family Medicine

## 2015-11-01 VITALS — BP 143/74 | HR 83 | Temp 98.0°F | Resp 18 | Wt 246.0 lb

## 2015-11-01 DIAGNOSIS — S338XXA Sprain of other parts of lumbar spine and pelvis, initial encounter: Secondary | ICD-10-CM

## 2015-11-01 DIAGNOSIS — S39012A Strain of muscle, fascia and tendon of lower back, initial encounter: Secondary | ICD-10-CM
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
## 2015-11-01 NOTE — Patient Instructions (Signed)
Thank you for coming in today. Return in 2-4 week for recheck.  Attend PT.  Come back or go to the emergency room if you notice new weakness new numbness problems walking or bowel or bladder problems.   Lumbosacral Strain Lumbosacral strain is a strain of any of the parts that make up your lumbosacral vertebrae. Your lumbosacral vertebrae are the bones that make up the lower third of your backbone. Your lumbosacral vertebrae are held together by muscles and tough, fibrous tissue (ligaments).  CAUSES  A sudden blow to your back can cause lumbosacral strain. Also, anything that causes an excessive stretch of the muscles in the low back can cause this strain. This is typically seen when people exert themselves strenuously, fall, lift heavy objects, bend, or crouch repeatedly. RISK FACTORS  Physically demanding work.  Participation in pushing or pulling sports or sports that require a sudden twist of the back (tennis, golf, baseball).  Weight lifting.  Excessive lower back curvature.  Forward-tilted pelvis.  Weak back or abdominal muscles or both.  Tight hamstrings. SIGNS AND SYMPTOMS  Lumbosacral strain may cause pain in the area of your injury or pain that moves (radiates) down your leg.  DIAGNOSIS Your health care provider can often diagnose lumbosacral strain through a physical exam. In some cases, you may need tests such as X-ray exams.  TREATMENT  Treatment for your lower back injury depends on many factors that your clinician will have to evaluate. However, most treatment will include the use of anti-inflammatory medicines. HOME CARE INSTRUCTIONS   Avoid hard physical activities (tennis, racquetball, waterskiing) if you are not in proper physical condition for it. This may aggravate or create problems.  If you have a back problem, avoid sports requiring sudden body movements. Swimming and walking are generally safer activities.  Maintain good posture.  Maintain a healthy  weight.  For acute conditions, you may put ice on the injured area.  Put ice in a plastic bag.  Place a towel between your skin and the bag.  Leave the ice on for 20 minutes, 2-3 times a day.  When the low back starts healing, stretching and strengthening exercises may be recommended. SEEK MEDICAL CARE IF:  Your back pain is getting worse.  You experience severe back pain not relieved with medicines. SEEK IMMEDIATE MEDICAL CARE IF:   You have numbness, tingling, weakness, or problems with the use of your arms or legs.  There is a change in bowel or bladder control.  You have increasing pain in any area of the body, including your belly (abdomen).  You notice shortness of breath, dizziness, or feel faint.  You feel sick to your stomach (nauseous), are throwing up (vomiting), or become sweaty.  You notice discoloration of your toes or legs, or your feet get very cold. MAKE SURE YOU:   Understand these instructions.  Will watch your condition.  Will get help right away if you are not doing well or get worse.   This information is not intended to replace advice given to you by your health care provider. Make sure you discuss any questions you have with your health care provider.   Document Released: 11/30/2004 Document Revised: 03/13/2014 Document Reviewed: 10/09/2012 Elsevier Interactive Patient Education [employer].
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
## 2015-11-02 ENCOUNTER — Ambulatory Visit (INDEPENDENT_AMBULATORY_CARE_PROVIDER_SITE_OTHER): Payer: Commercial Managed Care - HMO | Admitting: Rehabilitative and Restorative Service Providers"

## 2015-11-02 ENCOUNTER — Encounter: Payer: Self-pay | Admitting: Rehabilitative and Restorative Service Providers"

## 2015-11-02 DIAGNOSIS — M545 Low back pain, unspecified: Secondary | ICD-10-CM

## 2015-11-02 DIAGNOSIS — R29898 Other symptoms and signs involving the musculoskeletal system: Secondary | ICD-10-CM
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
## 2015-11-02 NOTE — Patient Instructions (Signed)
Abdominal Bracing With Pelvic Floor (Hook-Lying)    With neutral spine, tighten pelvic floor and abdominals sucking belly button to back bone; tighten muscles in low back at waist.  Hold 10-20 sec. Repeat _10__ times. Do _several__ times a day. Progress to do this in sitting standing walking and with functional activities   HIP: Hamstrings - Supine   Place strap around foot. Raise leg up, keeping knee straight.  Bend opposite knee to protect back if indicated. Hold 30 seconds. 3 reps per set, 2-3 sets per day     Outer Hip Stretch: Reclined IT Band Stretch (Strap)   Strap around one foot, pull leg across body until you feel a pull or stretch, with shoulders on mat. Hold for 30 seconds. Repeat 3 times each leg. 2-3 times/day.  Piriformis Stretch   Lying on back, pull right knee toward opposite shoulder. Hold 30 seconds. Repeat 3 times. Do 2-3 sessions per day. Vary angle from foot resting across opposite knee to knee toward opposite shoulder    Trigger Point Dry Needling  . What is Trigger Point Dry Needling (DN)? o DN is a physical therapy technique used to treat muscle pain and dysfunction. Specifically, DN helps deactivate muscle trigger points (muscle knots).  o A thin filiform needle is used to penetrate the skin and stimulate the underlying trigger point. The goal is for a local twitch response (LTR) to occur and for the trigger point to relax. No medication of any kind is injected during the procedure.   . What Does Trigger Point Dry Needling Feel Like?  o The procedure feels different for each individual patient. Some patients report that they do not actually feel the needle enter the skin and overall the process is not painful. Very mild bleeding may occur. However, many patients feel a deep cramping in the muscle in which the needle was inserted. This is the local twitch response.   Marland Kitchen How Will I feel after the treatment? o Soreness is normal, and the onset of  soreness may not occur for a few hours. Typically this soreness does not last longer than two days.  o Bruising is uncommon, however; ice can be used to decrease any possible bruising.  o In rare cases feeling tired or nauseous after the treatment is normal. In addition, your symptoms may get worse before they get better, this period will typically not last longer than 24 hours.   . What Can I do After My Treatment? o Increase your hydration by drinking more water for the next 24 hours. o You may place ice or heat on the areas treated that have become sore, however, do not use heat on inflamed or bruised areas. Heat often brings more relief post needling. o You can continue your regular activities, but vigorous activity is not recommended initially after the treatment for 24 hours. o DN is best combined with other physical therapy such as strengthening, stretching, and other therapies.

## 2015-11-05 ENCOUNTER — Ambulatory Visit (INDEPENDENT_AMBULATORY_CARE_PROVIDER_SITE_OTHER): Payer: Commercial Managed Care - HMO | Admitting: Physical Therapy

## 2015-11-05 DIAGNOSIS — M545 Low back pain, unspecified: Secondary | ICD-10-CM

## 2015-11-05 DIAGNOSIS — R29898 Other symptoms and signs involving the musculoskeletal system: Secondary | ICD-10-CM | POA: Diagnosis not present
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
## 2015-11-09 ENCOUNTER — Ambulatory Visit (INDEPENDENT_AMBULATORY_CARE_PROVIDER_SITE_OTHER): Payer: Commercial Managed Care - HMO | Admitting: Family Medicine

## 2015-11-09 ENCOUNTER — Encounter: Payer: Self-pay | Admitting: Family Medicine

## 2015-11-09 ENCOUNTER — Encounter: Payer: Self-pay | Admitting: Rehabilitative and Restorative Service Providers"

## 2015-11-09 VITALS — BP 126/80 | HR 79 | Resp 16 | Ht 68.0 in | Wt 245.1 lb

## 2015-11-09 DIAGNOSIS — M5430 Sciatica, unspecified side: Secondary | ICD-10-CM

## 2015-11-09 DIAGNOSIS — I10 Essential (primary) hypertension: Secondary | ICD-10-CM | POA: Diagnosis not present

## 2015-11-09 DIAGNOSIS — E1065 Type 1 diabetes mellitus with hyperglycemia: Secondary | ICD-10-CM

## 2015-11-09 DIAGNOSIS — Z23 Encounter for immunization: Secondary | ICD-10-CM

## 2015-11-09 DIAGNOSIS — I5022 Chronic systolic (congestive) heart failure: Secondary | ICD-10-CM

## 2015-11-09 DIAGNOSIS — E785 Hyperlipidemia, unspecified: Secondary | ICD-10-CM | POA: Diagnosis not present

## 2015-11-09 DIAGNOSIS — F418 Other specified anxiety disorders: Secondary | ICD-10-CM

## 2015-11-09 NOTE — Patient Instructions (Addendum)
Annual exam in February, call if you need me bfore  Flu and pneumonia vaccines today  CBC, fasting lipid, cmp and eGFR, TSH and microalb for feb visit  Foot exam reveals no sensation, need to examine feet daily  You are referred for eye exam in [place]/ [place]  Please work on good  health habits so that your health will improve. 1. Commitment to daily physical activity for 30 to 60  minutes, if you are able to do this.  2. Commitment to wise food choices. Aim for half of your  food intake to be vegetable and fruit, one quarter starchy foods, and one quarter protein. Try to eat on a regular schedule  3 meals per day, snacking between meals should be limited to vegetables or fruits or small portions of nuts. 64 ounces of water per day is generally recommended, unless you have specific health conditions, like heart failure or kidney failure where you will need to limit fluid intake.  3. Commitment to sufficient and a  good quality of physical and mental rest daily, generally between 6 to 8 hours per day.  WITH PERSISTANCE AND PERSEVERANCE, THE IMPOSSIBLE , BECOMES THE NORM! Thank you  for choosing [facility]. We consider it a privelige to serve you.  Delivering excellent health care in a caring and  compassionate way is our goal.  Partnering with you,  so that together we can achieve this goal is our strategy.
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
## 2015-11-11 ENCOUNTER — Encounter: Payer: Self-pay | Admitting: Family Medicine

## 2015-11-11 NOTE — Assessment & Plan Note (Signed)
Imp[roved, managed by psych, pt voluntarily weaned off of fluoxetine, will be addressed later this month when he sees psych
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
## 2015-11-11 NOTE — Assessment & Plan Note (Signed)
Stable , no s/s of decompensation 

## 2015-11-11 NOTE — Assessment & Plan Note (Signed)
Controlled,  And improved, no change in medication, managed by endo Matthew [name] is reminded of the importance of commitment to daily physical activity for 30 minutes or more, as able and the need to limit carbohydrate intake to 30 to 60 grams per meal to help with blood sugar control.   The need to take medication as prescribed, test blood sugar as directed, and to call between visits if there is a concern that blood sugar is uncontrolled is also discussed.   Matthew [name] is reminded of the importance of daily foot exam, annual eye examination, and good blood sugar, blood pressure and cholesterol control.  Diabetic Labs Latest Ref Rng & Units 08/17/2015 10/01/2014 12/11/2013 09/26/2013 06/13/2013  HbA1c - 6.2 9.8(H) - 8.0(H) -  Microalbumin <2.0 mg/dL - - 116.1(H) - -  Micro/Creat Ratio 0.0 - 30.0 mg/g - - 963.5(H) - -  Chol 125 - 200 mg/dL - 152 - - 169  HDL >=40 mg/dL - 24(L) - - 26(L)  Calc LDL <130 mg/dL - 76 - - 81  Triglycerides <150 mg/dL - 262(H) - - 311(H)  Creatinine 0.70 - 1.33 mg/dL - 1.68(H) - - -  GFR >60.00 mL/min - - - - -   BP/Weight 11/09/2015 11/01/2015 10/01/2015 09/17/2015 05/17/2015 04/22/2015 123XX123  Systolic BP 123XX123 [id number] 123XX123 0000000 Q000111Q 123XX123  Diastolic BP 80 74 72 77 93 72 63  Wt. (Lbs) 245.12 246 241 243.5 252 257.5 258  BMI 37.27 36.33 35.59 36.48 37.2 38.01 38.08  Some encounter information is confidential and restricted. Go to Review Flowsheets activity to see all data.   Foot/eye exam completion dates Latest Ref Rng & Units 11/09/2015 08/26/2014  Eye Exam No Retinopathy - Retinopathy(A)  Foot exam Order - - -  Foot Form Completion - Done -

## 2015-11-11 NOTE — Assessment & Plan Note (Signed)
Hyperlipidemia:Low fat diet discussed and encouraged.   Lipid Panel  Lab Results  Component Value Date   CHOL 152 10/01/2014   HDL 24 (L) 10/01/2014   LDLCALC 76 10/01/2014   LDLDIRECT 159 (H) 10/02/2012   TRIG 262 (H) 10/01/2014   CHOLHDL 6.3 (H) 10/01/2014   Updated lab needed .

## 2015-11-11 NOTE — Assessment & Plan Note (Signed)
Controlled, no change in medication DASH diet and commitment to daily physical activity for a minimum of 30 minutes discussed and encouraged, as a part of hypertension management. The importance of attaining a healthy weight is also discussed.  BP/Weight 11/09/2015 11/01/2015 10/01/2015 09/17/2015 05/17/2015 04/22/2015 123XX123  Systolic BP 123XX123 [id number] 123XX123 0000000 Q000111Q 123XX123  Diastolic BP 80 74 72 77 93 72 63  Wt. (Lbs) 245.12 246 241 243.5 252 257.5 258  BMI 37.27 36.33 35.59 36.48 37.2 38.01 38.08  Some encounter information is confidential and restricted. Go to Review Flowsheets activity to see all data.

## 2015-11-11 NOTE — Assessment & Plan Note (Addendum)
unchanged. Patient re-educated about  the importance of commitment to a  minimum of 150 minutes of exercise per week.  The importance of healthy food choices with portion control discussed. Encouraged to start a food diary, count calories and to consider  joining a support group. Sample diet sheets offered. Goals set by the patient for the next several months.   Weight /BMI 11/09/2015 11/01/2015 10/01/2015  WEIGHT 245 lb 1.9 oz 246 lb 241 lb  HEIGHT 5\' 8"  - 5\' 9"   BMI 37.27 kg/m2 36.33 kg/m2 35.59 kg/m2  Some encounter information is confidential and restricted. Go to Review Flowsheets activity to see all data.

## 2015-11-12 ENCOUNTER — Ambulatory Visit (INDEPENDENT_AMBULATORY_CARE_PROVIDER_SITE_OTHER): Payer: Commercial Managed Care - HMO | Admitting: Rehabilitative and Restorative Service Providers"

## 2015-11-12 ENCOUNTER — Encounter: Payer: Self-pay | Admitting: Rehabilitative and Restorative Service Providers"

## 2015-11-12 DIAGNOSIS — M545 Low back pain, unspecified: Secondary | ICD-10-CM

## 2015-11-12 DIAGNOSIS — R29898 Other symptoms and signs involving the musculoskeletal system: Secondary | ICD-10-CM
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
## 2015-11-12 NOTE — Patient Instructions (Signed)
Bridging    Slowly raise buttocks from floor, keeping core tight. Repeat __10__ times per set. Do __1-3__ sets per session. Do __1__ sessions per day.  Trigger Point Dry Needling  . What is Trigger Point Dry Needling (DN)? o DN is a physical therapy technique used to treat muscle pain and dysfunction. Specifically, DN helps deactivate muscle trigger points (muscle knots).  o A thin filiform needle is used to penetrate the skin and stimulate the underlying trigger point. The goal is for a local twitch response (LTR) to occur and for the trigger point to relax. No medication of any kind is injected during the procedure.   . What Does Trigger Point Dry Needling Feel Like?  o The procedure feels different for each individual patient. Some patients report that they do not actually feel the needle enter the skin and overall the process is not painful. Very mild bleeding may occur. However, many patients feel a deep cramping in the muscle in which the needle was inserted. This is the local twitch response.   Marland Kitchen How Will I feel after the treatment? o Soreness is normal, and the onset of soreness may not occur for a few hours. Typically this soreness does not last longer than two days.  o Bruising is uncommon, however; ice can be used to decrease any possible bruising.  o In rare cases feeling tired or nauseous after the treatment is normal. In addition, your symptoms may get worse before they get better, this period will typically not last longer than 24 hours.   . What Can I do After My Treatment? o Increase your hydration by drinking more water for the next 24 hours. o You may place ice or heat on the areas treated that have become sore, however, do not use heat on inflamed or bruised areas. Heat often brings more relief post needling. o You can continue your regular activities, but vigorous activity is not recommended initially after the treatment for 24 hours. o DN is best combined with other  physical therapy such as strengthening, stretching, and other therapies.

## 2015-11-16 ENCOUNTER — Ambulatory Visit (INDEPENDENT_AMBULATORY_CARE_PROVIDER_SITE_OTHER): Payer: Commercial Managed Care - HMO | Admitting: Rehabilitative and Restorative Service Providers"

## 2015-11-16 ENCOUNTER — Encounter: Payer: Self-pay | Admitting: Rehabilitative and Restorative Service Providers"

## 2015-11-16 DIAGNOSIS — R29898 Other symptoms and signs involving the musculoskeletal system: Secondary | ICD-10-CM

## 2015-11-16 DIAGNOSIS — M545 Low back pain, unspecified: Secondary | ICD-10-CM
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
## 2015-11-17 ENCOUNTER — Ambulatory Visit (HOSPITAL_COMMUNITY): Payer: Self-pay | Admitting: Psychiatry

## 2015-11-19 ENCOUNTER — Encounter: Payer: Commercial Managed Care - HMO | Admitting: Physical Therapy

## 2015-11-23 ENCOUNTER — Encounter: Payer: Self-pay | Admitting: Rehabilitative and Restorative Service Providers"

## 2015-11-23 ENCOUNTER — Ambulatory Visit (INDEPENDENT_AMBULATORY_CARE_PROVIDER_SITE_OTHER): Payer: Commercial Managed Care - HMO | Admitting: Rehabilitative and Restorative Service Providers"

## 2015-11-23 DIAGNOSIS — M545 Low back pain, unspecified: Secondary | ICD-10-CM

## 2015-11-23 DIAGNOSIS — R29898 Other symptoms and signs involving the musculoskeletal system: Secondary | ICD-10-CM
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
## 2015-11-26 ENCOUNTER — Encounter: Payer: Self-pay | Admitting: Rehabilitative and Restorative Service Providers"

## 2015-12-06 ENCOUNTER — Encounter (HOSPITAL_COMMUNITY): Payer: Self-pay | Admitting: Psychiatry

## 2015-12-06 ENCOUNTER — Ambulatory Visit (INDEPENDENT_AMBULATORY_CARE_PROVIDER_SITE_OTHER): Payer: Commercial Managed Care - HMO | Admitting: Psychiatry

## 2015-12-06 VITALS — BP 110/67 | HR 59 | Ht 68.0 in | Wt 243.2 lb

## 2015-12-06 DIAGNOSIS — F4001 Agoraphobia with panic disorder: Secondary | ICD-10-CM | POA: Diagnosis not present

## 2015-12-06 DIAGNOSIS — F418 Other specified anxiety disorders: Secondary | ICD-10-CM

## 2015-12-06 DIAGNOSIS — F329 Major depressive disorder, single episode, unspecified: Secondary | ICD-10-CM

## 2015-12-06 MED ORDER — CLONAZEPAM 1 MG PO TABS: 1.0000 mg | ORAL_TABLET | Freq: Three times a day (TID) | ORAL | 2 refills | Status: DC

## 2015-12-06 MED ORDER — BUSPIRONE HCL 10 MG PO TABS: 10.0000 mg | ORAL_TABLET | Freq: Three times a day (TID) | ORAL | 2 refills | Status: DC
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
## 2015-12-22 ENCOUNTER — Other Ambulatory Visit: Payer: Self-pay | Admitting: Pharmacist

## 2015-12-22 NOTE — Patient Outreach (Signed)
Outreach call to [employer]  regarding his request for follow up from the [facility] Medication Adherence Campaign. Called and spoke with patient. HIPAA identifiers verified and verbal consent received.  Mr. Loerch reports that he is continuing to take his Tyler Aas as directed. However, reports that he has recently stopped taking his Victoza due to cost. Reports that he has entered the coverage gap of his insurance. Reports that his blood sugar has been doing well, but that he has not yet let his endocrinologist know about having stopped the medication. Advise patient to follow up with endocrinologist to let them know that he has stopped the Victoza due to entering the coverage gap and to discuss how his diabetes management may or may not need to be adjusted. Mr. Hartsfield states that he had been planning to call his endocrinologist's office to schedule his follow up visit and that he will call and discuss this as well.   Discuss with Mr. Renicker medication assistance options. Mr. Bigos reports that his income is above the limit for extra help. However, reports that it is within the range for the patient assistance through [employer] for [employer]. Note that this application requires patient to have a denial letter for the extra help application. Review application and this requirement with Mr. Michniewicz. Mr. Deyo reports that he will complete the extra help application himself online. Reports that he will discuss the patient assistance application for Victoza with his endocrinologist's office and plan to complete this with them. Ask that patient contact me if he has any further questions about the patient assistance application or would like for me to assist with this process. Provide patient with my phone number.  Patient reports that he will be switching Medicare insurance plans to [employer] next year. Counsel Mr. Fahey about using [facility]'s mail order pharmacy for cost savings. Mr. Pickler expresses  appreciation for this information. Mr. Sartwell reports that he has no further medication questions at this time.  Harlow Asa, PharmD Clinical Pharmacist [facility] [phone]

## 2015-12-26 ENCOUNTER — Other Ambulatory Visit: Payer: Self-pay | Admitting: Internal Medicine

## 2016-01-05 DIAGNOSIS — H338 Other retinal detachments: Secondary | ICD-10-CM | POA: Diagnosis not present

## 2016-01-05 DIAGNOSIS — E113513 Type 2 diabetes mellitus with proliferative diabetic retinopathy with macular edema, bilateral: Secondary | ICD-10-CM | POA: Diagnosis not present

## 2016-01-05 DIAGNOSIS — H35373 Puckering of macula, bilateral: Secondary | ICD-10-CM | POA: Diagnosis not present

## 2016-01-05 DIAGNOSIS — H25813 Combined forms of age-related cataract, bilateral: Secondary | ICD-10-CM | POA: Diagnosis not present

## 2016-01-06 ENCOUNTER — Encounter: Payer: Self-pay | Admitting: *Deleted

## 2016-01-13 DIAGNOSIS — E113513 Type 2 diabetes mellitus with proliferative diabetic retinopathy with macular edema, bilateral: Secondary | ICD-10-CM | POA: Diagnosis not present

## 2016-01-13 DIAGNOSIS — H35373 Puckering of macula, bilateral: Secondary | ICD-10-CM | POA: Diagnosis not present

## 2016-01-13 DIAGNOSIS — H338 Other retinal detachments: Secondary | ICD-10-CM | POA: Diagnosis not present

## 2016-01-18 DIAGNOSIS — N183 Chronic kidney disease, stage 3 (moderate): Secondary | ICD-10-CM | POA: Diagnosis not present

## 2016-01-18 DIAGNOSIS — I25810 Atherosclerosis of coronary artery bypass graft(s) without angina pectoris: Secondary | ICD-10-CM | POA: Diagnosis not present

## 2016-01-18 DIAGNOSIS — E1122 Type 2 diabetes mellitus with diabetic chronic kidney disease: Secondary | ICD-10-CM | POA: Diagnosis not present

## 2016-01-18 DIAGNOSIS — E1140 Type 2 diabetes mellitus with diabetic neuropathy, unspecified: Secondary | ICD-10-CM | POA: Diagnosis not present

## 2016-01-18 DIAGNOSIS — Z794 Long term (current) use of insulin: Secondary | ICD-10-CM | POA: Diagnosis not present

## 2016-01-18 DIAGNOSIS — I10 Essential (primary) hypertension: Secondary | ICD-10-CM | POA: Diagnosis not present

## 2016-01-18 DIAGNOSIS — E785 Hyperlipidemia, unspecified: Secondary | ICD-10-CM | POA: Diagnosis not present

## 2016-01-18 DIAGNOSIS — E1165 Type 2 diabetes mellitus with hyperglycemia: Secondary | ICD-10-CM | POA: Diagnosis not present

## 2016-01-18 DIAGNOSIS — I129 Hypertensive chronic kidney disease with stage 1 through stage 4 chronic kidney disease, or unspecified chronic kidney disease: Secondary | ICD-10-CM | POA: Diagnosis not present

## 2016-01-18 DIAGNOSIS — E113419 Type 2 diabetes mellitus with severe nonproliferative diabetic retinopathy with macular edema, unspecified eye: Secondary | ICD-10-CM | POA: Diagnosis not present

## 2016-01-18 DIAGNOSIS — E1169 Type 2 diabetes mellitus with other specified complication: Secondary | ICD-10-CM | POA: Diagnosis not present

## 2016-01-18 LAB — HEMOGLOBIN A1C: Hemoglobin A1C: 7.8

## 2016-01-18 LAB — LIPID PANEL: LDL CALC: 81 mg/dL

## 2016-01-18 LAB — MICROALBUMIN, URINE: Microalb, Ur: 471.3

## 2016-01-20 ENCOUNTER — Telehealth: Payer: Self-pay | Admitting: Family Medicine

## 2016-01-20 NOTE — Telephone Encounter (Signed)
Mr. Dinardi is asking if Dr. Moshe Cipro has received results from Dr. [place] Poli and is asking if a Urology referral is needing to be made, Please advise?

## 2016-01-21 ENCOUNTER — Encounter: Payer: Self-pay | Admitting: Internal Medicine

## 2016-01-21 ENCOUNTER — Ambulatory Visit (INDEPENDENT_AMBULATORY_CARE_PROVIDER_SITE_OTHER): Payer: Medicare Other | Admitting: Internal Medicine

## 2016-01-21 VITALS — BP 140/80 | HR 72 | Ht 69.0 in | Wt 244.8 lb

## 2016-01-21 DIAGNOSIS — I472 Ventricular tachycardia: Secondary | ICD-10-CM

## 2016-01-21 DIAGNOSIS — Z95810 Presence of automatic (implantable) cardiac defibrillator: Secondary | ICD-10-CM

## 2016-01-21 DIAGNOSIS — I5022 Chronic systolic (congestive) heart failure: Secondary | ICD-10-CM | POA: Diagnosis not present

## 2016-01-21 DIAGNOSIS — I4729 Other ventricular tachycardia: Secondary | ICD-10-CM

## 2016-01-21 LAB — CUP PACEART INCLINIC DEVICE CHECK
HighPow Impedance: 28 Ohm
HighPow Impedance: 51 Ohm
Implantable Lead Implant Date: 20110706
Implantable Lead Location: 753859
Implantable Lead Location: 753860
Implantable Lead Model: 184
Implantable Lead Model: 5076
Implantable Lead Serial Number: 310740
Implantable Pulse Generator Implant Date: 20110706
Lead Channel Pacing Threshold Amplitude: 0.9 V
Lead Channel Pacing Threshold Pulse Width: 0.4 ms
Lead Channel Sensing Intrinsic Amplitude: 13.5 mV
Lead Channel Setting Pacing Amplitude: 2 V
Lead Channel Setting Pacing Amplitude: 2.4 V
Lead Channel Setting Pacing Pulse Width: 0.4 ms
Lead Channel Setting Sensing Sensitivity: 0.5 mV
MDC IDC LEAD IMPLANT DT: 20110706
MDC IDC MSMT LEADCHNL RA IMPEDANCE VALUE: 452 Ohm
MDC IDC MSMT LEADCHNL RA PACING THRESHOLD AMPLITUDE: 0.5 V
MDC IDC MSMT LEADCHNL RA PACING THRESHOLD PULSEWIDTH: 0.4 ms
MDC IDC MSMT LEADCHNL RA SENSING INTR AMPL: 2.9 mV
MDC IDC MSMT LEADCHNL RV IMPEDANCE VALUE: 452 Ohm
MDC IDC PG SERIAL: 168307
MDC IDC SESS DTM: 20171117050000
MDC IDC STAT BRADY RA PERCENT PACED: 1 % — AB
MDC IDC STAT BRADY RV PERCENT PACED: 1 % — AB

## 2016-01-21 MED ORDER — FUROSEMIDE 40 MG PO TABS: 40.0000 mg | ORAL_TABLET | Freq: Every day | ORAL | 3 refills | Status: DC

## 2016-01-21 MED ORDER — NITROGLYCERIN 0.4 MG SL SUBL: 0.4000 mg | SUBLINGUAL_TABLET | SUBLINGUAL | 1 refills | Status: AC | PRN

## 2016-01-21 NOTE — Patient Instructions (Signed)
Medication Instructions:  Your physician recommends that you continue on your current medications as directed. Please refer to the Current Medication list given to you today.   Labwork: None Ordered   Testing/Procedures: None Ordered   Follow-Up: Your physician wants you to follow-up in: 1 year with Dr. Lovena Le. You will receive a reminder letter in the mail two months in advance. If you don't receive a letter, please call our office to schedule the follow-up appointment.  Remote monitoring is used to monitor your ICD from home. This monitoring reduces the number of office visits required to check your device to one time per year. It allows [place] to keep an eye on the functioning of your device to ensure it is working properly. You are scheduled for a device check from home on 04/24/16. You may send your transmission at any time that day. If you have a wireless device, the transmission will be sent automatically. After your physician reviews your transmission, you will receive a postcard with your next transmission date.    Any Other Special Instructions Will Be Listed Below (If Applicable).     If you need a refill on your cardiac medications before your next appointment, please call your pharmacy.

## 2016-01-21 NOTE — Progress Notes (Signed)
HPI Matthew [name] returns today for followup. He is a pleasant 50 yo man with a h/o an ICM, chronic systolic heart failure, nonsustained ventricular tachycardia, morbid obesity, and diabetes. In the interim, he continues to experience class II heart failure. No recent ICD shocks and no syncope. He does get dyspnea with exertion. Minimal edema in his left leg. Allergies  Allergen Reactions  . Ace Inhibitors Cough  . Cephalexin Other (See Comments)    Resistant to antibiotic Resistant to antibiotic  . Beta Adrenergic Blockers Cough     Current Outpatient Prescriptions  Medication Sig Dispense Refill  . aspirin 325 MG tablet Take 325 mg by mouth daily.      . baclofen (LIORESAL) 10 MG tablet TAKE ONE TABLET BY MOUTH AT BEDTIME 30 tablet 11  . busPIRone (BUSPAR) 10 MG tablet Take 10 mg by mouth daily as needed (anxiety).    . Chromium Picolinate 200 MCG CAPS Take 200 mcg by mouth daily.     . clonazePAM (KLONOPIN) 1 MG tablet Take 1 mg by mouth daily.    Marland Kitchen ezetimibe (ZETIA) 10 MG tablet Take 10 mg by mouth daily.    . furosemide (LASIX) 40 MG tablet Take 1 tablet (40 mg total) by mouth daily. Patient is overdue for an appointment. Please call and schedule for further refills 30 tablet 0  . Insulin Degludec (TRESIBA FLEXTOUCH) 100 UNIT/ML SOPN Inject 38 Units into the skin daily.    Marland Kitchen losartan (COZAAR) 50 MG tablet Take 1 tablet (50 mg total) by mouth daily. 30 tablet 5  . methylcellulose (ARTIFICIAL TEARS) 1 % ophthalmic solution Place 1 drop into both eyes 2 (two) times daily as needed (dry eyes).    . metoprolol tartrate (LOPRESSOR) 25 MG tablet TAKE ONE TABLET BY MOUTH TWICE DAILY 180 tablet 1  . Multiple Vitamin (MULTIVITAMIN) tablet Take 1 tablet by mouth daily.      . nitroGLYCERIN (NITROSTAT) 0.4 MG SL tablet Place 1 tablet (0.4 mg total) under the tongue every 5 (five) minutes as needed for chest pain. 30 tablet 12  . Omega-3 Fatty Acids (FISH OIL BURP-LESS PO) Take 1 tablet by mouth  daily.      Marland Kitchen Propylhexedrine (BENZEDREX) INHA Place 1 each into the nose 2 (two) times daily as needed (congestion).     . rosuvastatin (CRESTOR) 20 MG tablet Take 1 tablet (20 mg total) by mouth daily. 90 tablet 3   No current facility-administered medications for this visit.      Past Medical History:  Diagnosis Date  . Abnormality of gait 04/08/2013  . AICD (automatic cardioverter/defibrillator) present   . Anxiety    stress  . Anxiety and depression   . CAD (coronary artery disease)    a. OOH MI 6/11; presented with acute CHF; hosp course c/b VF arrest with VDRF, etc;   b s/p CABG: L-LAD/Dx, S-OM/dCFX;  c.Nuclear scan 8/12:  Inferior and inf-lat scar with minimal peri-infarct ischemia, EF 53%.; d.LHC 9/12:  LAD 90%, pD1 (small) 70%, prox large Dx 95%, L-LAD and Dx ok, CFX occluded, S-OM and CFX ok, RCA prox to prox/mid 60-70%.  Medical management     . Cardiac arrest - ventricular fibrillation 08/26/2009  . Carpal tunnel syndrome of right wrist   . Cellulitis 2012   spider bite  . Chronic systolic heart failure ([place])   . CKD (chronic kidney disease)   . Coma ([place])   . Diabetic coma with ketoacidosis ([place])    Occured in 1994  where he spent 30 days in a coma . He develpoed infection of his leg and developing pancreatitis during that  time   . DM2 (diabetes mellitus, type 2) ([place])   . Dysrhythmia   . Foot drop, bilateral 04/08/2013  . Headache(784.0)    sinus  . History of degenerative disc disease   . HLD (hyperlipidemia)   . HTN (hypertension)   . Hypercholesteremia   . Ischemic cardiomyopathy    echo 10/11:  inf-septal and apical HK, EF 35%, mod LAE  . Neuropathy ([place])   . Nocturnal leg cramps   . Obesity   . Pancreatitis 2008  . 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 Presence of permanent cardiac pacemaker   . Retinal detachment    Bilateral, laser surgery on the right  . RLS (restless legs syndrome) 04/22/2015  . Thyroid disease   . Ulnar neuropathy at elbow     bilateral    ROS:   All systems reviewed and negative except as noted in the HPI.   Past Surgical History:  Procedure Laterality Date  . APPENDECTOMY  mid 1990  . CARDIAC DEFIBRILLATOR PLACEMENT    . CARDIAC SURGERY    . CARPAL TUNNEL WITH CUBITAL TUNNEL  02/22/2012   Procedure: CARPAL TUNNEL WITH CUBITAL TUNNEL;  Surgeon: Roseanne Kaufman, MD;  Location: [facility];  Service: Orthopedics;  Laterality: Right;  Right Carpal Tunnel Release/Right Cubital Tunnel Release and Transposition if Necessary with Flexor Pronator Release  . COLONOSCOPY WITH PROPOFOL N/A 11/17/2014   Procedure: COLONOSCOPY WITH PROPOFOL;  Surgeon: Manus Gunning, MD;  Location: [facility];  Service: Gastroenterology;  Laterality: N/A;  . COLONOSCOPY WITH PROPOFOL N/A 01/05/2015   Procedure: COLONOSCOPY WITH PROPOFOL;  Surgeon: Manus Gunning, MD;  Location: [facility];  Service: Gastroenterology;  Laterality: N/A;  . CORONARY ARTERY BYPASS GRAFT    . coronary artery bypass grafting x4  june 29,2011   x 4  . PACEMAKER PLACEMENT    . RETINAL DETACHMENT SURGERY Bilateral   . RETINAL DETACHMENT SURGERY Bilateral    3 months ago  . right sided abdominal cyst removal    . TONSILLECTOMY  1998  . ULNAR NERVE TRANSPOSITION Right      Family History  Problem Relation Age of Onset  . Allergies Mother   . Diabetes Mother   . Hypertension Mother   . Hyperlipidemia Mother   . Dementia Mother   . Colon cancer Mother   . Lung cancer Mother   . Kidney cancer Mother   . Colon polyps Mother   . Stroke Father   . Allergies Father   . Dementia Father   . Alcohol abuse Father   . Colon polyps Father   . Diabetes Sister   . Heart disease Sister   . Anxiety disorder Sister   . Cancer Maternal Grandmother     unknown type  . Colon polyps Sister   . Schizophrenia Paternal Uncle   . Schizophrenia Cousin   . Pancreatic cancer Sister      Social History   Social History  . Marital status: Divorced     Spouse name: N/A  . Number of children: 0  . Years of education: college   Occupational History  . 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 Smoking status: Never Smoker  . Smokeless tobacco: Current User    Types: Snuff  . Alcohol use No     Comment: rarely  . Drug  use: No  . Sexual activity: No   Other Topics Concern  . Not on file   Social History Narrative   Patient drinks about 6 cups of caffeine daily.   Patient is left handed.     BP 140/80   Pulse 72   Ht 5\' 9"  (1.753 m)   Wt 244 lb 12.8 oz (111 kg)   SpO2 99%   BMI 36.15 kg/m   Physical Exam:  obese appearing 50 year old man,NAD HEENT: Unremarkable Neck:  7 cm JVD, no thyromegally Back:  No CVA tenderness Lungs:  Clear with no wheezes, rales, or rhonchi. HEART:  Regular rate rhythm, no murmurs, no rubs, no clicks Abd:  Soft, obese, positive bowel sounds, no organomegally, no rebound, no guarding Ext:  2 plus pulses, no edema, no cyanosis, no clubbing Skin:  No rashes no nodules Neuro:  CN II through XII intact, motor grossly intact   DEVICE  Normal device function.  See PaceArt for details.   Assess/Plan: 1. Chronic systolic heart failure - his symptoms are well controlled. He appears euvolemic. He is encouraged to maintain a low sodium diet. 2. NSVT - he is asymptomatic and his VT is mostly well controlled.  3. HTN - his blood pressure is up a bit. I have asked him to try and lose 20 lbs.  4. ICD - his boston sci device dual chamber device is working normally.   Cristopher Peru, M.D.

## 2016-01-24 ENCOUNTER — Ambulatory Visit: Payer: Commercial Managed Care - HMO | Admitting: Neurology

## 2016-01-29 NOTE — Telephone Encounter (Signed)
Noted.  Encounter Notes available for review (printed in box).

## 2016-01-31 ENCOUNTER — Other Ambulatory Visit: Payer: Self-pay | Admitting: Family Medicine

## 2016-01-31 DIAGNOSIS — N183 Chronic kidney disease, stage 3 unspecified: Secondary | ICD-10-CM

## 2016-01-31 NOTE — Telephone Encounter (Signed)
Kidney function is diminished, and would benefit from nEPHROLOGY, medical Doc for kidney disease to be involved in his care. NEEDS to control blood sugar to preserve his kidneys. I have entered referral to nephrology in [place] where he lives, let him kno pls

## 2016-02-15 NOTE — Telephone Encounter (Signed)
Noted  

## 2016-03-02 ENCOUNTER — Other Ambulatory Visit: Payer: Self-pay | Admitting: Family Medicine

## 2016-03-07 ENCOUNTER — Ambulatory Visit (HOSPITAL_COMMUNITY): Payer: Self-pay | Admitting: Psychiatry

## 2016-03-15 DIAGNOSIS — H25813 Combined forms of age-related cataract, bilateral: Secondary | ICD-10-CM | POA: Diagnosis not present

## 2016-03-15 DIAGNOSIS — H40003 Preglaucoma, unspecified, bilateral: Secondary | ICD-10-CM | POA: Diagnosis not present

## 2016-03-15 DIAGNOSIS — E113513 Type 2 diabetes mellitus with proliferative diabetic retinopathy with macular edema, bilateral: Secondary | ICD-10-CM | POA: Diagnosis not present

## 2016-03-15 DIAGNOSIS — H35373 Puckering of macula, bilateral: Secondary | ICD-10-CM | POA: Diagnosis not present

## 2016-03-20 ENCOUNTER — Encounter: Payer: Self-pay | Admitting: Neurology

## 2016-03-20 ENCOUNTER — Ambulatory Visit (INDEPENDENT_AMBULATORY_CARE_PROVIDER_SITE_OTHER): Payer: Medicare Other | Admitting: Neurology

## 2016-03-20 VITALS — BP 141/77 | HR 71 | Ht 69.0 in | Wt 237.0 lb

## 2016-03-20 DIAGNOSIS — G2581 Restless legs syndrome: Secondary | ICD-10-CM | POA: Diagnosis not present

## 2016-03-20 DIAGNOSIS — M21372 Foot drop, left foot: Secondary | ICD-10-CM | POA: Diagnosis not present

## 2016-03-20 DIAGNOSIS — R269 Unspecified abnormalities of gait and mobility: Secondary | ICD-10-CM | POA: Diagnosis not present

## 2016-03-20 DIAGNOSIS — M21371 Foot drop, right foot: Secondary | ICD-10-CM

## 2016-03-20 DIAGNOSIS — E1142 Type 2 diabetes mellitus with diabetic polyneuropathy: Secondary | ICD-10-CM | POA: Diagnosis not present

## 2016-03-20 MED ORDER — BACLOFEN 10 MG PO TABS: 20.0000 mg | ORAL_TABLET | Freq: Every day | ORAL | 3 refills | Status: DC

## 2016-03-20 NOTE — Progress Notes (Signed)
Reason for visit: Peripheral neuropathy  Matthew [name] is an 51 y.o. male  History of present illness:  Matthew [name] is a 51 year old left-handed white male with a history of a diabetic peripheral neuropathy associated with intrinsic muscle weakness of the hands and bilateral foot drops. The patient is not having a lot of discomfort in the feet at night, he has come off of the gabapentin has felt better with his ability to process information cognitively. The patient still has gait instability, he uses a cane for ambulation, he will fall on occasion. Occasionally he may note that the leg on one side or the other may collapse. The patient has some nocturnal leg cramps, baclofen does the help this at night. He has some problems with restless leg syndrome. He comes to this office for an evaluation.  Past Medical History:  Diagnosis Date  . Abnormality of gait 04/08/2013  . AICD (automatic cardioverter/defibrillator) present   . Anxiety    stress  . Anxiety and depression   . CAD (coronary artery disease)    a. OOH MI 6/11; presented with acute CHF; hosp course c/b VF arrest with VDRF, etc;   b s/p CABG: L-LAD/Dx, S-OM/dCFX;  c.Nuclear scan 8/12:  Inferior and inf-lat scar with minimal peri-infarct ischemia, EF 53%.; d.LHC 9/12:  LAD 90%, pD1 (small) 70%, prox large Dx 95%, L-LAD and Dx ok, CFX occluded, S-OM and CFX ok, RCA prox to prox/mid 60-70%.  Medical management     . Cardiac arrest - ventricular fibrillation 08/26/2009  . Carpal tunnel syndrome of right wrist   . Cellulitis 2012   spider bite  . Chronic systolic heart failure ([place])   . CKD (chronic kidney disease)   . Coma ([place])   . Diabetic coma with ketoacidosis ([place])    Occured in 1994 where he spent 30 days in a coma . He develpoed infection of his leg and developing pancreatitis during that  time   . DM2 (diabetes mellitus, type 2) ([place])   . Dysrhythmia   . Foot drop, bilateral 04/08/2013  . Headache(784.0)    sinus  . History  of degenerative disc disease   . HLD (hyperlipidemia)   . HTN (hypertension)   . Hypercholesteremia   . Ischemic cardiomyopathy    echo 10/11:  inf-septal and apical HK, EF 35%, mod LAE  . Neuropathy ([place])   . Nocturnal leg cramps   . Obesity   . Pancreatitis 2008  . 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 Presence of permanent cardiac pacemaker   . Retinal detachment    Bilateral, laser surgery on the right  . RLS (restless legs syndrome) 04/22/2015  . Thyroid disease   . 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 APPENDECTOMY  mid 1990  . CARDIAC DEFIBRILLATOR PLACEMENT    . CARDIAC SURGERY    . CARPAL TUNNEL WITH CUBITAL TUNNEL  02/22/2012   Procedure: CARPAL TUNNEL WITH CUBITAL TUNNEL;  Surgeon: Roseanne Kaufman, MD;  Location: [facility];  Service: Orthopedics;  Laterality: Right;  Right Carpal Tunnel Release/Right Cubital Tunnel Release and Transposition if Necessary with Flexor Pronator Release  . COLONOSCOPY WITH PROPOFOL N/A 11/17/2014   Procedure: COLONOSCOPY WITH PROPOFOL;  Surgeon: Manus Gunning, MD;  [facility];  Service: Gastroenterology;  Laterality: N/A;  . COLONOSCOPY WITH PROPOFOL N/A 01/05/2015   Procedure: COLONOSCOPY WITH PROPOFOL;  Surgeon: Manus Gunning, MD;  [facility];  Service: Gastroenterology;  Laterality: N/A;  . CORONARY ARTERY BYPASS GRAFT    . coronary artery bypass grafting x4  june 29,2011   x 4  . PACEMAKER PLACEMENT    . RETINAL DETACHMENT SURGERY Bilateral   . RETINAL DETACHMENT SURGERY Bilateral    3 months ago  . right sided abdominal cyst removal    . TONSILLECTOMY  1998  . ULNAR NERVE TRANSPOSITION Right     Family History  Problem Relation Age of Onset  . Allergies Mother   . Diabetes Mother   . Hypertension Mother   . Hyperlipidemia Mother   . Dementia Mother   . Colon cancer Mother   . Lung cancer Mother   . Kidney cancer Mother   .  Colon polyps Mother   . Stroke Father   . Allergies Father   . Dementia Father   . Alcohol abuse Father   . Colon polyps Father   . Diabetes Sister   . Heart disease Sister   . Anxiety disorder Sister   . Cancer Maternal Grandmother     unknown type  . Colon polyps Sister   . Schizophrenia Paternal Uncle   . Schizophrenia Cousin   . Pancreatic cancer Sister     Social history:  reports that he has never smoked. His smokeless tobacco use includes Snuff. He reports that he does not drink alcohol or use drugs.    Allergies  Allergen Reactions  . Ace Inhibitors Cough  . Cephalexin Other (See Comments)    Resistant to antibiotic Resistant to antibiotic  . Beta Adrenergic Blockers Cough    Medications:  Prior to Admission medications   Medication Sig Start Date End Date Taking? Authorizing Provider  aspirin 325 MG tablet Take 325 mg by mouth daily.     Yes Historical Provider, MD  baclofen (LIORESAL) 10 MG tablet Take 2 tablets (20 mg total) by mouth at bedtime. 03/20/16  Yes Kathrynn Ducking, MD  busPIRone (BUSPAR) 10 MG tablet Take 10 mg by mouth daily as needed (anxiety).   Yes Historical Provider, MD  Chromium Picolinate 200 MCG CAPS Take 200 mcg by mouth daily.    Yes Historical Provider, MD  clonazePAM (KLONOPIN) 1 MG tablet Take 1 mg by mouth daily.   Yes Historical Provider, MD  ezetimibe (ZETIA) 10 MG tablet Take 10 mg by mouth daily.   Yes Historical Provider, MD  furosemide (LASIX) 40 MG tablet Take 1 tablet (40 mg total) by mouth daily. 01/21/16  Yes Evans Lance, MD  Insulin Glargine-Lixisenatide 100-33 UNT-MCG/ML SOPN Inject into the skin. 02/18/16  Yes Historical Provider, MD  losartan (COZAAR) 50 MG tablet Take 1 tablet (50 mg total) by mouth daily. 09/30/13  Yes [name], MD  methylcellulose (ARTIFICIAL TEARS) 1 % ophthalmic solution Place 1 drop into both eyes 2 (two) times daily as needed (dry eyes).   Yes Historical Provider, MD  metoprolol tartrate  (LOPRESSOR) 25 MG tablet TAKE ONE TABLET BY MOUTH TWICE DAILY 03/07/16  Yes [name], MD  Multiple Vitamin (MULTIVITAMIN) tablet Take 1 tablet by mouth daily.     Yes Historical Provider, MD  nitroGLYCERIN (NITROSTAT) 0.4 MG SL tablet Place 1 tablet (0.4 mg total) under the tongue every 5 (five) minutes as needed for chest pain. 01/21/16 01/26/20 Yes Evans Lance, MD  Omega-3 Fatty Acids (FISH OIL BURP-LESS PO) Take 1 tablet by mouth daily.     Yes Historical Provider, MD  Propylhexedrine Darcella Gasman) INHA Place  1 each into the nose 2 (two) times daily as needed (congestion).    Yes Historical Provider, MD  rosuvastatin (CRESTOR) 20 MG tablet Take 1 tablet (20 mg total) by mouth daily. 06/23/13  Yes [name], MD    ROS:  Out of a complete 14 system review of symptoms, the patient complains only of the following symptoms, and all other reviewed systems are negative.  Eye itching, eye redness, light sensitivity, double vision, loss of vision, blurred vision Runny nose Cough Restless legs, insomnia, snoring Leg swelling, palpitations of the heart Heat intolerance, excessive thirst Environmental allergies Joint pain, back pain, muscle cramps, walking difficulty, neck pain, neck stiffness Memory loss, dizziness, headache, numbness, weakness Agitation, depression, anxiety  Blood pressure (!) 141/77, pulse 71, height 5\' 9"  (1.753 m), weight 237 lb (107.5 kg).  Physical Exam  General: The patient is alert and cooperative at the time of the examination.  Skin: No significant peripheral edema is noted.   Neurologic Exam  Mental status: The patient is alert and oriented x 3 at the time of the examination. The patient has apparent normal recent and remote memory, with an apparently normal attention span and concentration ability.   Cranial nerves: Facial symmetry is present. Speech is normal, no aphasia or dysarthria is noted. Extraocular movements are full. Visual fields  are full.  Motor: The patient has good strength in all 4 extremities, with exception of bilateral foot drops in intrinsic muscle weakness of the hands bilaterally.  Sensory examination: Soft touch sensation is symmetric on the face, arms, and legs. There is a stocking pattern pinprick sensory deficit up to the knees bilaterally.  Coordination: The patient has good finger-nose-finger and heel-to-shin bilaterally.  Gait and station: The patient has a steppage gait bilaterally, wide-based. Tandem gait is unsteady. Romberg is negative. No drift is seen.  Reflexes: Deep tendon reflexes are symmetric, but are depressed to absent throughout.   Assessment/Plan:  1. Peripheral neuropathy, bilateral foot drops  2. Gait instability  3. Restless leg syndrome, nocturnal leg cramps  We will go up on the baclofen to 20 mg at night for the leg cramps. The patient will call if he continues to have problems. He will follow up in about 6 months.  Jill Alexanders MD 03/20/2016 1:03 PM  [facility] [address] [place], [place] [address]  Phone [phone] Fax [phone]

## 2016-03-22 ENCOUNTER — Ambulatory Visit (HOSPITAL_COMMUNITY): Payer: Self-pay | Admitting: Psychiatry

## 2016-03-22 ENCOUNTER — Telehealth (HOSPITAL_COMMUNITY): Payer: Self-pay | Admitting: *Deleted

## 2016-03-22 NOTE — Telephone Encounter (Signed)
lmtcb on both numbers on file to resch appt for 03-22-16 due to weather.

## 2016-04-10 ENCOUNTER — Encounter: Payer: Self-pay | Admitting: Family Medicine

## 2016-04-10 ENCOUNTER — Ambulatory Visit (INDEPENDENT_AMBULATORY_CARE_PROVIDER_SITE_OTHER): Payer: Medicare Other

## 2016-04-10 ENCOUNTER — Ambulatory Visit (INDEPENDENT_AMBULATORY_CARE_PROVIDER_SITE_OTHER): Payer: Medicare Other | Admitting: Family Medicine

## 2016-04-10 VITALS — BP 112/70 | HR 79 | Resp 14 | Ht 69.0 in | Wt 243.0 lb

## 2016-04-10 VITALS — BP 112/70 | HR 79 | Temp 98.4°F | Ht 69.0 in | Wt 243.0 lb

## 2016-04-10 DIAGNOSIS — E559 Vitamin D deficiency, unspecified: Secondary | ICD-10-CM

## 2016-04-10 DIAGNOSIS — H547 Unspecified visual loss: Secondary | ICD-10-CM

## 2016-04-10 DIAGNOSIS — Z1159 Encounter for screening for other viral diseases: Secondary | ICD-10-CM

## 2016-04-10 DIAGNOSIS — F418 Other specified anxiety disorders: Secondary | ICD-10-CM

## 2016-04-10 DIAGNOSIS — E782 Mixed hyperlipidemia: Secondary | ICD-10-CM | POA: Diagnosis not present

## 2016-04-10 DIAGNOSIS — Z794 Long term (current) use of insulin: Secondary | ICD-10-CM

## 2016-04-10 DIAGNOSIS — E119 Type 2 diabetes mellitus without complications: Secondary | ICD-10-CM

## 2016-04-10 DIAGNOSIS — E1122 Type 2 diabetes mellitus with diabetic chronic kidney disease: Secondary | ICD-10-CM | POA: Diagnosis not present

## 2016-04-10 DIAGNOSIS — N183 Chronic kidney disease, stage 3 unspecified: Secondary | ICD-10-CM

## 2016-04-10 DIAGNOSIS — IMO0001 Reserved for inherently not codable concepts without codable children: Secondary | ICD-10-CM

## 2016-04-10 DIAGNOSIS — F439 Reaction to severe stress, unspecified: Secondary | ICD-10-CM

## 2016-04-10 DIAGNOSIS — Z0000 Encounter for general adult medical examination without abnormal findings: Secondary | ICD-10-CM

## 2016-04-10 DIAGNOSIS — I5022 Chronic systolic (congestive) heart failure: Secondary | ICD-10-CM

## 2016-04-10 DIAGNOSIS — I10 Essential (primary) hypertension: Secondary | ICD-10-CM

## 2016-04-10 DIAGNOSIS — N5201 Erectile dysfunction due to arterial insufficiency: Secondary | ICD-10-CM

## 2016-04-10 NOTE — Patient Instructions (Addendum)
F/u on same day as father, call if you you need me before  Fasting lipid, cmp and EGFr, TSH, Hepatitis C screen, vit D and CBC and HBA1C Feb 14 or shortly after   Keep endo appt  Resched appt with Dr Theressa Stamps are being referred to Dr Jeffie Pollock  Thank you  for choosing [facility]. We consider it a privelige to serve you.  Delivering excellent health care in a caring and  compassionate way is our goal.  Partnering with you,  so that together we can achieve this goal is our strategy.

## 2016-04-10 NOTE — Patient Instructions (Addendum)
Health maintenance: Due for Hep. C screening, please have Dr. Larose Kells send [place] a copy of your most recent diabetic eye exam.   Patient concerns: None   Nurse concerns: Falls, recommend safe strides program with [facility] at home.    Next PCP appt: Today with Dr. Moshe Cipro. Please follow up in 1 year for your annual wellness visit  Advance directive discussed with patient today. Copy provided for patient to complete at home and have notarized. Patient agrees to have copy sent to our office once it is complete.   Preventive Care 40-64 Years, Male Preventive care refers to lifestyle choices and visits with your health care provider that can promote health and wellness. What does preventive care include?  A yearly physical exam. This is also called an annual well check.  Dental exams once or twice a year.  Routine eye exams. Ask your health care provider how often you should have your eyes checked.  Personal lifestyle choices, including:  Daily care of your teeth and gums.  Regular physical activity.  Eating a healthy diet.  Avoiding tobacco and drug use.  Limiting alcohol use.  Practicing safe sex.  Taking low-dose aspirin every day starting at age 14. What happens during an annual well check? The services and screenings done by your health care provider during your annual well check will depend on your age, overall health, lifestyle risk factors, and family history of disease. Counseling  Your health care provider may ask you questions about your:  Alcohol use.  Tobacco use.  Drug use.  Emotional well-being.  Home and relationship well-being.  Sexual activity.  Eating habits.  Work and work Statistician. Screening  You may have the following tests or measurements:  Height, weight, and BMI.  Blood pressure.  Lipid and cholesterol levels. These may be checked every 5 years, or more frequently if you are over 31 years old.  Skin check.  Lung cancer  screening. You may have this screening every year starting at age 32 if you have a 30-pack-year history of smoking and currently smoke or have quit within the past 15 years.  Fecal occult blood test (FOBT) of the stool. You may have this test every year starting at age 53.  Flexible sigmoidoscopy or colonoscopy. You may have a sigmoidoscopy every 5 years or a colonoscopy every 10 years starting at age 87.  Prostate cancer screening. Recommendations will vary depending on your family history and other risks.  Hepatitis C blood test.  Diabetes screening. This is done by checking your blood sugar (glucose) after you have not eaten for a while (fasting). You may have this done every 1-3 years. Discuss your test results, treatment options, and if necessary, the need for more tests with your health care provider. Vaccines  Your health care provider may recommend certain vaccines, such as:  Influenza vaccine. This is recommended every year.  Tetanus, diphtheria, and acellular pertussis (Tdap, Td) vaccine. You may need a Td booster every 10 years.  Zoster vaccine. You may need this after age 23.  Measles, mumps, and rubella (MMR) vaccine. You may need at least one dose of MMR if you were born in 1957 or later. You may also need a second dose.  Pneumococcal 13-valent conjugate (PCV13) vaccine. You may need this if you have certain conditions and have not been vaccinated.  Pneumococcal polysaccharide (PPSV23) vaccine. You may need one or two doses if you smoke cigarettes or if you have certain conditions.  Talk to your health care  provider about which screenings and vaccines you need and how often you need them. This information is not intended to replace advice given to you by your health care provider. Make sure you discuss any questions you have with your health care provider. Document Released: 03/19/2015 Document Revised: 11/10/2015 Document Reviewed: 12/22/2014 Elsevier Interactive Patient  Education  2017 [employer].

## 2016-04-10 NOTE — Progress Notes (Signed)
Subjective:   Matthew [name] is a 51 y.o. male who presents for an Initial Medicare Annual Wellness Visit.  Review of Systems  Cardiac Risk Factors include: diabetes mellitus;dyslipidemia;hypertension;male gender;obesity (BMI >30kg/m2);family history of premature cardiovascular disease;smoking/ tobacco exposure    Objective:    Today's Vitals   04/10/16 1320  BP: 112/70  Pulse: 79  Temp: 98.4 F (36.9 C)  TempSrc: Oral  SpO2: 94%  Weight: 243 lb 0.6 oz (110.2 kg)  Height: 5\' 9"  (1.753 m)   Body mass index is 35.89 kg/m.  Current Medications (verified) Outpatient Encounter Prescriptions as of 04/10/2016  Medication Sig  . aspirin 325 MG tablet Take 325 mg by mouth daily.    . baclofen (LIORESAL) 10 MG tablet Take 2 tablets (20 mg total) by mouth at bedtime.  . busPIRone (BUSPAR) 10 MG tablet Take 10 mg by mouth daily as needed (anxiety).  . Chromium Picolinate 200 MCG CAPS Take 200 mcg by mouth daily.   . clonazePAM (KLONOPIN) 1 MG tablet Take 1 mg by mouth daily as needed.   . ezetimibe (ZETIA) 10 MG tablet Take 10 mg by mouth daily.  . furosemide (LASIX) 40 MG tablet Take 1 tablet (40 mg total) by mouth daily.  . Insulin Glargine-Lixisenatide 100-33 UNT-MCG/ML SOPN Inject 36 Units into the skin daily.   Marland Kitchen ketorolac (ACULAR) 0.4 % SOLN Apply 1 drop to eye 4 (four) times daily.  Marland Kitchen losartan (COZAAR) 50 MG tablet Take 1 tablet (50 mg total) by mouth daily.  . methylcellulose (ARTIFICIAL TEARS) 1 % ophthalmic solution Place 1 drop into both eyes 2 (two) times daily as needed (dry eyes).  . metoprolol tartrate (LOPRESSOR) 25 MG tablet TAKE ONE TABLET BY MOUTH TWICE DAILY (Patient taking differently: TAKE ONE TABLET BY MOUTH DAILY)  . moxifloxacin (VIGAMOX) 0.5 % ophthalmic solution Place 1 drop into both eyes 4 (four) times daily.  . Multiple Vitamin (MULTIVITAMIN) tablet Take 1 tablet by mouth daily.    . nitroGLYCERIN (NITROSTAT) 0.4 MG SL tablet Place 1 tablet (0.4 mg total)  under the tongue every 5 (five) minutes as needed for chest pain.  . Omega-3 Fatty Acids (FISH OIL BURP-LESS PO) Take 1 tablet by mouth daily.    . prednisoLONE acetate (PRED FORTE) 1 % ophthalmic suspension Place 1 drop into both eyes 4 (four) times daily.  Marland Kitchen Propylhexedrine (BENZEDREX) INHA Place 1 each into the nose 2 (two) times daily as needed (congestion).   . rosuvastatin (CRESTOR) 20 MG tablet Take 1 tablet (20 mg total) by mouth daily.   No facility-administered encounter medications on file as of 04/10/2016.     Allergies (verified) Ace inhibitors; Cephalexin; and Beta adrenergic blockers   History: Past Medical History:  Diagnosis Date  . Abnormality of gait 04/08/2013  . AICD (automatic cardioverter/defibrillator) present   . Anxiety    stress  . Anxiety and depression   . CAD (coronary artery disease)    a. OOH MI 6/11; presented with acute CHF; hosp course c/b VF arrest with VDRF, etc;   b s/p CABG: L-LAD/Dx, S-OM/dCFX;  c.Nuclear scan 8/12:  Inferior and inf-lat scar with minimal peri-infarct ischemia, EF 53%.; d.LHC 9/12:  LAD 90%, pD1 (small) 70%, prox large Dx 95%, L-LAD and Dx ok, CFX occluded, S-OM and CFX ok, RCA prox to prox/mid 60-70%.  Medical management     . Cardiac arrest - ventricular fibrillation 08/26/2009  . Carpal tunnel syndrome of right wrist   . Cellulitis 2012  spider bite  . Chronic systolic heart failure ([place])   . CKD (chronic kidney disease)   . Coma ([place])   . Diabetic coma with ketoacidosis ([place])    Occured in 1994 where he spent 30 days in a coma . He develpoed infection of his leg and developing pancreatitis during that  time   . DM2 (diabetes mellitus, type 2) ([place])   . Dysrhythmia   . Foot drop, bilateral 04/08/2013  . Headache(784.0)    sinus  . History of degenerative disc disease   . HLD (hyperlipidemia)   . HTN (hypertension)   . Hypercholesteremia   . Ischemic cardiomyopathy    echo 10/11:  inf-septal and apical HK, EF 35%, mod LAE    . Neuropathy ([place])   . Nocturnal leg cramps   . Obesity   . Pancreatitis 2008  . 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 Presence of permanent cardiac pacemaker   . Retinal detachment    Bilateral, laser surgery on the right  . RLS (restless legs syndrome) 04/22/2015  . Thyroid disease   . 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 APPENDECTOMY  mid 1990  . CARDIAC DEFIBRILLATOR PLACEMENT    . CARDIAC SURGERY    . CARPAL TUNNEL WITH CUBITAL TUNNEL  02/22/2012   Procedure: CARPAL TUNNEL WITH CUBITAL TUNNEL;  Surgeon: Roseanne Kaufman, MD;  Location: [facility];  Service: Orthopedics;  Laterality: Right;  Right Carpal Tunnel Release/Right Cubital Tunnel Release and Transposition if Necessary with Flexor Pronator Release  . COLONOSCOPY WITH PROPOFOL N/A 11/17/2014   Procedure: COLONOSCOPY WITH PROPOFOL;  Surgeon: Manus Gunning, MD;  Location: [facility];  Service: Gastroenterology;  Laterality: N/A;  . COLONOSCOPY WITH PROPOFOL N/A 01/05/2015   Procedure: COLONOSCOPY WITH PROPOFOL;  Surgeon: Manus Gunning, MD;  Location: [facility];  Service: Gastroenterology;  Laterality: N/A;  . CORONARY ARTERY BYPASS GRAFT    . coronary artery bypass grafting x4  june 29,2011   x 4  . PACEMAKER PLACEMENT    . RETINAL DETACHMENT SURGERY Bilateral   . RETINAL DETACHMENT SURGERY Bilateral    3 months ago  . right sided abdominal cyst removal    . TONSILLECTOMY  1998  . ULNAR NERVE TRANSPOSITION Right    Family History  Problem Relation Age of Onset  . Allergies Mother   . Diabetes Mother   . Hypertension Mother   . Hyperlipidemia Mother   . Dementia Mother   . Colon cancer Mother   . Lung cancer Mother   . Kidney cancer Mother   . Colon polyps Mother   . Stroke Father   . Allergies Father   . Dementia Father   . Alcohol abuse Father   . Colon polyps Father   . Diabetes Sister   . Heart disease Sister   . Anxiety  disorder Sister   . Cancer Maternal Grandmother     unknown type  . Schizophrenia Paternal Uncle   . Schizophrenia Cousin    Social History   Occupational History  . 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 Smoking status: Never Smoker  . Smokeless tobacco: Current User    Types: Snuff  . Alcohol use No     Comment: rarely  . Drug use: No  . Sexual activity: No   Tobacco Counseling Ready to quit: Not Answered Counseling given: Not Answered   Activities of  Daily Living In your present state of health, do you have any difficulty performing the following activities: 04/10/2016  Hearing? N  Vision? Y  Difficulty concentrating or making decisions? Y  Walking or climbing stairs? Y  Dressing or bathing? N  Doing errands, shopping? N  Preparing Food and eating ? N  Using the Toilet? N  In the past six months, have you accidently leaked urine? Y  Do you have problems with loss of bowel control? N  Managing your Medications? N  Managing your Finances? N  Housekeeping or managing your Housekeeping? N  Some recent data might be hidden    Immunizations and Health Maintenance Immunization History  Administered Date(s) Administered  . Influenza Whole 02/19/2003  . Influenza,inj,Quad PF,36+ Mos 103-Jun-202014, 12/11/2013, 06/02/2015, 11/09/2015  . Pneumococcal Polysaccharide-23 11/16/2003, 11/09/2015  . Td 02/19/2003  . Tdap 03/20/2013   Health Maintenance Due  Topic Date Due  . Hepatitis C Screening  March 03, 1966    Patient Care Team: [name], MD as PCP - General Evans Lance, MD as Consulting Physician (Cardiology) Sherren Mocha, MD as Consulting Physician (Cardiology) Manus Gunning, MD as Consulting Physician (Gastroenterology)  Indicate any recent Medical Services you may have received from other than [facility] providers in the past year (date may be approximate).    Assessment:   This is a routine wellness examination  for [facility].  Dietary issues and exercise activities discussed: Current Exercise Habits: Home exercise routine, Type of exercise: strength training/weights;walking;stretching, Time (Minutes): 45, Frequency (Times/Week): 3, Weekly Exercise (Minutes/Week): 135, Intensity: Moderate  Goals    . Weight (lb) < 200 lb (90.7 kg)          Patient would like to lose 43 lbs to get to his goal weight of 200 lbs.      Depression Screen PHQ 2/9 Scores 04/10/2016 03/20/2013 01/07/2013  PHQ - 2 Score 0 5 3  PHQ- 9 Score - 19 17    Fall Risk Fall Risk  04/10/2016 01/07/2013  Falls in the past year? Yes Yes  Number falls in past yr: 2 or more -  Injury with Fall? No -  Risk for fall due to : History of fall(s);Impaired balance/gait History of fall(s);Impaired balance/gait;Impaired vision;Impaired mobility  Follow up Falls evaluation completed;Falls prevention discussed -    Cognitive Function: Normal   6CIT Screen 04/10/2016  What Year? 0 points  What month? 0 points  What time? 0 points  Count back from 20 0 points  Months in reverse 0 points  Repeat phrase 0 points  Total Score 0    Screening Tests Health Maintenance  Topic Date Due  . Hepatitis C Screening  05-11-1965  . HEMOGLOBIN A1C  07/17/2016  . FOOT EXAM  11/10/2016  . OPHTHALMOLOGY EXAM  01/04/2017  . TETANUS/TDAP  03/21/2023  . COLONOSCOPY  01/04/2025  . PNEUMOCOCCAL POLYSACCHARIDE VACCINE  Completed  . INFLUENZA VACCINE  Completed  . HIV Screening  Completed        Plan:  I have personally reviewed and addressed the Medicare Annual Wellness questionnaire and have noted the following in the patient's chart:  A. Medical and social history B. Use of alcohol, tobacco or illicit drugs  C. Current medications and supplements D. Functional ability and status E.  Nutritional status F.  Physical activity G. Advance directives discussed today H. List of other physicians I.  Hospitalizations, surgeries, and ER visits in  previous 12 months J.  Vitals K. Screenings to include hearing, vision,  cognitive, depression L. Referrals and appointments - none  In addition, I have reviewed and discussed with patient certain preventive protocols, quality metrics, and best practice recommendations. A written personalized care plan for preventive services as well as general preventive health recommendations were provided to patient.  Signed,   Stormy Fabian, LPN Lead Nurse Health Advisor

## 2016-04-11 ENCOUNTER — Encounter: Payer: Self-pay | Admitting: Family Medicine

## 2016-04-11 DIAGNOSIS — N184 Chronic kidney disease, stage 4 (severe): Secondary | ICD-10-CM

## 2016-04-11 DIAGNOSIS — E1122 Type 2 diabetes mellitus with diabetic chronic kidney disease: Secondary | ICD-10-CM | POA: Insufficient documentation

## 2016-04-11 HISTORY — DX: Chronic kidney disease, stage 4 (severe): E11.22

## 2016-04-11 NOTE — Assessment & Plan Note (Signed)
Hyperlipidemia:Low fat diet discussed and encouraged.  Updated lab needed 

## 2016-04-11 NOTE — Assessment & Plan Note (Signed)
Refer to nephrology for eval and management 

## 2016-04-11 NOTE — Progress Notes (Signed)
Kirin Bueltel     MRN: [id number]      DOB: 24-Feb-1966   HPI Mr. Ugalde is here for follow up and re-evaluation of chronic medical conditions, medication management and review of any available recent lab and radiology data.  Preventive health is updated, specifically  Cancer screening and Immunization.   Has upcoming cataract surgery, bilateral Needs appt with nephrology and also urology Will reschedule psych appointment The PT denies any adverse reactions to current medications since the last visit.  Denies polyuria, polydipsia, blurred vision , or hypoglycemic episodes.    ROS Denies recent fever or chills. Denies sinus pressure, nasal congestion, ear pain or sore throat. Denies chest congestion, productive cough or wheezing. Denies chest pains, palpitations and leg swelling Denies abdominal pain, nausea, vomiting,diarrhea or constipation.   Denies dysuria, frequency, hesitancy or incontinence.c/o ED wants to be re evaluated by urology. Denies joint pain, swelling and limitation in mobility. Denies headaches, seizures, numbness, or tingling. Denies uncontrolled depression, anxiety or insomnia. Denies skin break down or rash.   PE  BP 112/70   Pulse 79   Resp 14   Ht 5\' 9"  (1.753 m)   Wt 243 lb (110.2 kg)   SpO2 94%   BMI 35.88 kg/m   Patient alert and oriented and in no cardiopulmonary distress.  HEENT: No facial asymmetry, EOMI,   oropharynx pink and moist.  Neck supple no JVD, no mass.  Chest: Clear to auscultation bilaterally.  CVS: S1, S2 no murmurs, no S3.Regular rate.  ABD: Soft non tender.   Ext: No edema  MS: Adequate ROM spine, shoulders, hips and knees.  Skin: Intact, no ulcerations or rash noted.  Psych: Good eye contact, normal affect. Memory intact not anxious or depressed appearing.  CNS: CN 2-12 intact, power,  normal throughout.no focal deficits noted.   Assessment & Plan  HYPERTENSION, BENIGN Controlled, no change in medication DASH  diet and commitment to daily physical activity for a minimum of 30 minutes discussed and encouraged, as a part of hypertension management. The importance of attaining a healthy weight is also discussed.  BP/Weight 04/10/2016 04/10/2016 03/20/2016 01/21/2016 11/09/2015 11/01/2015 123XX123  Systolic BP XX123456 [id number] Q000111Q XX123456 [id number]  Diastolic BP 70 70 77 80 80 74 72  Wt. (Lbs) 243 243.04 237 244.8 245.12 246 241  BMI 35.88 35.89 35 36.15 37.27 36.33 35.59  Some encounter information is confidential and restricted. Go to Review Flowsheets activity to see all data.       Depression with anxiety Improved managed by psych, needs to reschedule follow up  Chronic systolic heart failure Asymptomatic and stable, followed by cardiology  Diabetes mellitus, insulin dependent (IDDM), uncontrolled Mr. Flanary is reminded of the importance of commitment to daily physical activity for 30 minutes or more, as able and the need to limit carbohydrate intake to 30 to 60 grams per meal to help with blood sugar control.   The need to take medication as prescribed, test blood sugar as directed, and to call between visits if there is a concern that blood sugar is uncontrolled is also discussed.   Mr. Arabie is reminded of the importance of daily foot exam, annual eye examination, and good blood sugar, blood pressure and cholesterol control. Followed by endo and improved Updated lab needed at/ before next visit.   Diabetic Labs Latest Ref Rng & Units 01/18/2016 08/17/2015 10/01/2014 12/11/2013 09/26/2013  HbA1c - 7.8 6.2 9.8(H) - 8.0(H)  Microalbumin - 471.3 - - 116.1(H) -  Micro/Creat Ratio 0.0 - 30.0 mg/g - - - 963.5(H) -  Chol 125 - 200 mg/dL - - 152 - -  HDL >=40 mg/dL - - 24(L) - -  Calc LDL mg/dL 81 - 76 - -  Triglycerides <150 mg/dL - - 262(H) - -  Creatinine 0.70 - 1.33 mg/dL - - 1.68(H) - -  GFR >60.00 mL/min - - - - -   BP/Weight 04/10/2016 04/10/2016 03/20/2016 01/21/2016 11/09/2015 11/01/2015 [id number]  Systolic  BP XX123456 XX123456 Q000111Q XX123456 [id number]  Diastolic BP 70 70 77 80 80 74 72  Wt. (Lbs) 243 243.04 237 244.8 245.12 246 241  BMI 35.88 35.89 35 36.15 37.27 36.33 35.59  Some encounter information is confidential and restricted. Go to Review Flowsheets activity to see all data.   Foot/eye exam completion dates Latest Ref Rng & Units 11/09/2015 08/26/2014  Eye Exam No Retinopathy - Retinopathy(A)  Foot exam Order - - -  Foot Form Completion - Done -        Hyperlipidemia Hyperlipidemia:Low fat diet discussed and encouraged.  Updated lab needed   Poor vision Upcoming bilateral cataract surgery  In the near future  Stress improved  CKD stage 3 due to type 2 diabetes mellitus ([place]) Refer to nephrology for eval and management   ED (erectile dysfunction) Requests re eval by urology, will refer

## 2016-04-11 NOTE — Assessment & Plan Note (Signed)
Mr. Stensrud is reminded of the importance of commitment to daily physical activity for 30 minutes or more, as able and the need to limit carbohydrate intake to 30 to 60 grams per meal to help with blood sugar control.   The need to take medication as prescribed, test blood sugar as directed, and to call between visits if there is a concern that blood sugar is uncontrolled is also discussed.   Mr. Tipler is reminded of the importance of daily foot exam, annual eye examination, and good blood sugar, blood pressure and cholesterol control. Followed by endo and improved Updated lab needed at/ before next visit.   Diabetic Labs Latest Ref Rng & Units 01/18/2016 08/17/2015 10/01/2014 12/11/2013 09/26/2013  HbA1c - 7.8 6.2 9.8(H) - 8.0(H)  Microalbumin - 471.3 - - 116.1(H) -  Micro/Creat Ratio 0.0 - 30.0 mg/g - - - 963.5(H) -  Chol 125 - 200 mg/dL - - 152 - -  HDL >=40 mg/dL - - 24(L) - -  Calc LDL mg/dL 81 - 76 - -  Triglycerides <150 mg/dL - - 262(H) - -  Creatinine 0.70 - 1.33 mg/dL - - 1.68(H) - -  GFR >60.00 mL/min - - - - -   BP/Weight 04/10/2016 04/10/2016 03/20/2016 01/21/2016 11/09/2015 11/01/2015 [id number]  Systolic BP XX123456 XX123456 Q000111Q XX123456 [id number]  Diastolic BP 70 70 77 80 80 74 72  Wt. (Lbs) 243 243.04 237 244.8 245.12 246 241  BMI 35.88 35.89 35 36.15 37.27 36.33 35.59  Some encounter information is confidential and restricted. Go to Review Flowsheets activity to see all data.   Foot/eye exam completion dates Latest Ref Rng & Units 11/09/2015 08/26/2014  Eye Exam No Retinopathy - Retinopathy(A)  Foot exam Order - - -  Foot Form Completion - Done -

## 2016-04-11 NOTE — Assessment & Plan Note (Signed)
improved

## 2016-04-11 NOTE — Assessment & Plan Note (Signed)
Upcoming bilateral cataract surgery  In the near future

## 2016-04-11 NOTE — Assessment & Plan Note (Signed)
Asymptomatic and stable, followed by cardiology 

## 2016-04-11 NOTE — Assessment & Plan Note (Signed)
Requests re eval by urology, will refer

## 2016-04-11 NOTE — Assessment & Plan Note (Signed)
Controlled, no change in medication DASH diet and commitment to daily physical activity for a minimum of 30 minutes discussed and encouraged, as a part of hypertension management. The importance of attaining a healthy weight is also discussed.  BP/Weight 04/10/2016 04/10/2016 03/20/2016 01/21/2016 11/09/2015 11/01/2015 [id number]  Systolic BP XX123456 XX123456 Q000111Q XX123456 [id number]  Diastolic BP 70 70 77 80 80 74 72  Wt. (Lbs) 243 243.04 237 244.8 245.12 246 241  BMI 35.88 35.89 35 36.15 37.27 36.33 35.59  Some encounter information is confidential and restricted. Go to Review Flowsheets activity to see all data.

## 2016-04-11 NOTE — Assessment & Plan Note (Signed)
Improved managed by psych, needs to reschedule follow up

## 2016-04-13 DIAGNOSIS — I252 Old myocardial infarction: Secondary | ICD-10-CM | POA: Diagnosis not present

## 2016-04-13 DIAGNOSIS — Z87891 Personal history of nicotine dependence: Secondary | ICD-10-CM | POA: Diagnosis not present

## 2016-04-13 DIAGNOSIS — H2181 Floppy iris syndrome: Secondary | ICD-10-CM | POA: Diagnosis not present

## 2016-04-13 DIAGNOSIS — Z950 Presence of cardiac pacemaker: Secondary | ICD-10-CM | POA: Diagnosis not present

## 2016-04-13 DIAGNOSIS — I5022 Chronic systolic (congestive) heart failure: Secondary | ICD-10-CM | POA: Diagnosis not present

## 2016-04-13 DIAGNOSIS — H25811 Combined forms of age-related cataract, right eye: Secondary | ICD-10-CM | POA: Insufficient documentation

## 2016-04-13 DIAGNOSIS — I2510 Atherosclerotic heart disease of native coronary artery without angina pectoris: Secondary | ICD-10-CM | POA: Diagnosis not present

## 2016-04-13 DIAGNOSIS — E785 Hyperlipidemia, unspecified: Secondary | ICD-10-CM | POA: Diagnosis not present

## 2016-04-13 DIAGNOSIS — I130 Hypertensive heart and chronic kidney disease with heart failure and stage 1 through stage 4 chronic kidney disease, or unspecified chronic kidney disease: Secondary | ICD-10-CM | POA: Diagnosis not present

## 2016-04-13 DIAGNOSIS — I4892 Unspecified atrial flutter: Secondary | ICD-10-CM | POA: Diagnosis not present

## 2016-04-13 DIAGNOSIS — H35373 Puckering of macula, bilateral: Secondary | ICD-10-CM | POA: Diagnosis not present

## 2016-04-13 DIAGNOSIS — E113513 Type 2 diabetes mellitus with proliferative diabetic retinopathy with macular edema, bilateral: Secondary | ICD-10-CM | POA: Diagnosis not present

## 2016-04-13 DIAGNOSIS — M1990 Unspecified osteoarthritis, unspecified site: Secondary | ICD-10-CM | POA: Diagnosis not present

## 2016-04-13 DIAGNOSIS — E1122 Type 2 diabetes mellitus with diabetic chronic kidney disease: Secondary | ICD-10-CM | POA: Diagnosis not present

## 2016-04-13 DIAGNOSIS — Z951 Presence of aortocoronary bypass graft: Secondary | ICD-10-CM | POA: Diagnosis not present

## 2016-04-13 DIAGNOSIS — Z7982 Long term (current) use of aspirin: Secondary | ICD-10-CM | POA: Diagnosis not present

## 2016-04-13 DIAGNOSIS — N189 Chronic kidney disease, unspecified: Secondary | ICD-10-CM | POA: Diagnosis not present

## 2016-04-13 DIAGNOSIS — I4891 Unspecified atrial fibrillation: Secondary | ICD-10-CM | POA: Diagnosis not present

## 2016-04-13 DIAGNOSIS — Z8249 Family history of ischemic heart disease and other diseases of the circulatory system: Secondary | ICD-10-CM | POA: Diagnosis not present

## 2016-04-13 DIAGNOSIS — Z79899 Other long term (current) drug therapy: Secondary | ICD-10-CM | POA: Diagnosis not present

## 2016-04-14 DIAGNOSIS — H40003 Preglaucoma, unspecified, bilateral: Secondary | ICD-10-CM | POA: Diagnosis not present

## 2016-04-14 DIAGNOSIS — H25812 Combined forms of age-related cataract, left eye: Secondary | ICD-10-CM | POA: Diagnosis not present

## 2016-04-14 DIAGNOSIS — Z961 Presence of intraocular lens: Secondary | ICD-10-CM | POA: Diagnosis not present

## 2016-04-14 DIAGNOSIS — E113513 Type 2 diabetes mellitus with proliferative diabetic retinopathy with macular edema, bilateral: Secondary | ICD-10-CM | POA: Diagnosis not present

## 2016-04-17 ENCOUNTER — Telehealth (HOSPITAL_COMMUNITY): Payer: Self-pay | Admitting: *Deleted

## 2016-04-17 ENCOUNTER — Ambulatory Visit (HOSPITAL_COMMUNITY): Payer: Self-pay | Admitting: Psychiatry

## 2016-04-17 NOTE — Telephone Encounter (Signed)
left voice message, provider out of office 04/17/16.

## 2016-04-18 DIAGNOSIS — N183 Chronic kidney disease, stage 3 (moderate): Secondary | ICD-10-CM | POA: Diagnosis not present

## 2016-04-18 DIAGNOSIS — E1165 Type 2 diabetes mellitus with hyperglycemia: Secondary | ICD-10-CM | POA: Diagnosis not present

## 2016-04-18 DIAGNOSIS — I129 Hypertensive chronic kidney disease with stage 1 through stage 4 chronic kidney disease, or unspecified chronic kidney disease: Secondary | ICD-10-CM | POA: Diagnosis not present

## 2016-04-18 DIAGNOSIS — E113419 Type 2 diabetes mellitus with severe nonproliferative diabetic retinopathy with macular edema, unspecified eye: Secondary | ICD-10-CM | POA: Diagnosis not present

## 2016-04-18 DIAGNOSIS — E1140 Type 2 diabetes mellitus with diabetic neuropathy, unspecified: Secondary | ICD-10-CM | POA: Diagnosis not present

## 2016-04-18 DIAGNOSIS — I10 Essential (primary) hypertension: Secondary | ICD-10-CM | POA: Diagnosis not present

## 2016-04-18 DIAGNOSIS — E785 Hyperlipidemia, unspecified: Secondary | ICD-10-CM | POA: Diagnosis not present

## 2016-04-18 DIAGNOSIS — E1169 Type 2 diabetes mellitus with other specified complication: Secondary | ICD-10-CM | POA: Diagnosis not present

## 2016-04-18 DIAGNOSIS — Z794 Long term (current) use of insulin: Secondary | ICD-10-CM | POA: Diagnosis not present

## 2016-04-18 DIAGNOSIS — E1122 Type 2 diabetes mellitus with diabetic chronic kidney disease: Secondary | ICD-10-CM | POA: Diagnosis not present

## 2016-04-21 DIAGNOSIS — E113513 Type 2 diabetes mellitus with proliferative diabetic retinopathy with macular edema, bilateral: Secondary | ICD-10-CM | POA: Diagnosis not present

## 2016-04-21 DIAGNOSIS — Z961 Presence of intraocular lens: Secondary | ICD-10-CM | POA: Diagnosis not present

## 2016-04-21 DIAGNOSIS — H25812 Combined forms of age-related cataract, left eye: Secondary | ICD-10-CM | POA: Diagnosis not present

## 2016-04-21 DIAGNOSIS — H40003 Preglaucoma, unspecified, bilateral: Secondary | ICD-10-CM | POA: Diagnosis not present

## 2016-04-24 ENCOUNTER — Ambulatory Visit (INDEPENDENT_AMBULATORY_CARE_PROVIDER_SITE_OTHER): Payer: Medicare Other | Admitting: *Deleted

## 2016-04-24 DIAGNOSIS — I472 Ventricular tachycardia: Secondary | ICD-10-CM | POA: Diagnosis not present

## 2016-04-24 DIAGNOSIS — I4729 Other ventricular tachycardia: Secondary | ICD-10-CM

## 2016-04-24 NOTE — Progress Notes (Signed)
Remote ICD transmission.   

## 2016-04-25 ENCOUNTER — Other Ambulatory Visit: Payer: Self-pay

## 2016-04-25 ENCOUNTER — Encounter: Payer: Self-pay | Admitting: Cardiology

## 2016-04-25 ENCOUNTER — Other Ambulatory Visit: Payer: Self-pay | Admitting: *Deleted

## 2016-04-25 LAB — CUP PACEART REMOTE DEVICE CHECK
Battery Remaining Longevity: 66 mo
Battery Remaining Percentage: 77 %
Brady Statistic RV Percent Paced: 0 %
Date Time Interrogation Session: 20180219144200
HIGH POWER IMPEDANCE MEASURED VALUE: 49 Ohm
Implantable Lead Implant Date: 20110706
Implantable Lead Location: 753860
Implantable Lead Model: 184
Implantable Lead Model: 5076
Implantable Pulse Generator Implant Date: 20110706
Lead Channel Pacing Threshold Amplitude: 0.5 V
Lead Channel Pacing Threshold Pulse Width: 0.4 ms
Lead Channel Pacing Threshold Pulse Width: 0.4 ms
Lead Channel Setting Pacing Amplitude: 2 V
Lead Channel Setting Pacing Amplitude: 2.4 V
Lead Channel Setting Pacing Pulse Width: 0.4 ms
MDC IDC LEAD IMPLANT DT: 20110706
MDC IDC LEAD LOCATION: 753859
MDC IDC LEAD SERIAL: 310740
MDC IDC MSMT LEADCHNL RA IMPEDANCE VALUE: 437 Ohm
MDC IDC MSMT LEADCHNL RV IMPEDANCE VALUE: 434 Ohm
MDC IDC MSMT LEADCHNL RV PACING THRESHOLD AMPLITUDE: 0.9 V
MDC IDC PG SERIAL: 168307
MDC IDC SET LEADCHNL RV SENSING SENSITIVITY: 0.5 mV
MDC IDC STAT BRADY RA PERCENT PACED: 0 %

## 2016-04-25 MED ORDER — BACLOFEN 10 MG PO TABS: 20.0000 mg | ORAL_TABLET | Freq: Every day | ORAL | 3 refills | Status: DC

## 2016-04-27 ENCOUNTER — Other Ambulatory Visit: Payer: Self-pay

## 2016-04-27 ENCOUNTER — Other Ambulatory Visit: Payer: Self-pay | Admitting: Internal Medicine

## 2016-04-27 DIAGNOSIS — E11618 Type 2 diabetes mellitus with other diabetic arthropathy: Secondary | ICD-10-CM | POA: Diagnosis not present

## 2016-04-27 DIAGNOSIS — Z951 Presence of aortocoronary bypass graft: Secondary | ICD-10-CM | POA: Diagnosis not present

## 2016-04-27 DIAGNOSIS — Z794 Long term (current) use of insulin: Secondary | ICD-10-CM | POA: Diagnosis not present

## 2016-04-27 DIAGNOSIS — Z961 Presence of intraocular lens: Secondary | ICD-10-CM | POA: Diagnosis not present

## 2016-04-27 DIAGNOSIS — H2181 Floppy iris syndrome: Secondary | ICD-10-CM | POA: Diagnosis not present

## 2016-04-27 DIAGNOSIS — E785 Hyperlipidemia, unspecified: Secondary | ICD-10-CM | POA: Diagnosis not present

## 2016-04-27 DIAGNOSIS — H25812 Combined forms of age-related cataract, left eye: Secondary | ICD-10-CM | POA: Insufficient documentation

## 2016-04-27 DIAGNOSIS — E113513 Type 2 diabetes mellitus with proliferative diabetic retinopathy with macular edema, bilateral: Secondary | ICD-10-CM | POA: Diagnosis not present

## 2016-04-27 DIAGNOSIS — I5022 Chronic systolic (congestive) heart failure: Secondary | ICD-10-CM | POA: Diagnosis not present

## 2016-04-27 DIAGNOSIS — I110 Hypertensive heart disease with heart failure: Secondary | ICD-10-CM | POA: Diagnosis not present

## 2016-04-27 DIAGNOSIS — Z7982 Long term (current) use of aspirin: Secondary | ICD-10-CM | POA: Diagnosis not present

## 2016-04-27 DIAGNOSIS — Z79899 Other long term (current) drug therapy: Secondary | ICD-10-CM | POA: Diagnosis not present

## 2016-04-27 DIAGNOSIS — Z9841 Cataract extraction status, right eye: Secondary | ICD-10-CM | POA: Diagnosis not present

## 2016-04-27 MED ORDER — METOPROLOL TARTRATE 25 MG PO TABS: 25.0000 mg | ORAL_TABLET | Freq: Two times a day (BID) | ORAL | 2 refills | Status: DC

## 2016-04-27 MED ORDER — FUROSEMIDE 40 MG PO TABS
40.0000 mg | ORAL_TABLET | Freq: Every day | ORAL | 2 refills | Status: DC
Start: 2016-04-27 — End: 2016-12-06

## 2016-04-27 MED ORDER — ROSUVASTATIN CALCIUM 20 MG PO TABS: 20.0000 mg | ORAL_TABLET | Freq: Every day | ORAL | 1 refills | Status: DC

## 2016-04-28 DIAGNOSIS — Z961 Presence of intraocular lens: Secondary | ICD-10-CM | POA: Diagnosis not present

## 2016-04-28 DIAGNOSIS — H40003 Preglaucoma, unspecified, bilateral: Secondary | ICD-10-CM | POA: Diagnosis not present

## 2016-04-28 DIAGNOSIS — H35373 Puckering of macula, bilateral: Secondary | ICD-10-CM | POA: Diagnosis not present

## 2016-04-28 DIAGNOSIS — E113513 Type 2 diabetes mellitus with proliferative diabetic retinopathy with macular edema, bilateral: Secondary | ICD-10-CM | POA: Diagnosis not present

## 2016-05-03 ENCOUNTER — Ambulatory Visit (INDEPENDENT_AMBULATORY_CARE_PROVIDER_SITE_OTHER): Payer: Medicare Other | Admitting: Psychiatry

## 2016-05-03 ENCOUNTER — Encounter (HOSPITAL_COMMUNITY): Payer: Self-pay | Admitting: Psychiatry

## 2016-05-03 VITALS — BP 138/90 | HR 81 | Ht 69.0 in | Wt 237.4 lb

## 2016-05-03 DIAGNOSIS — Z888 Allergy status to other drugs, medicaments and biological substances status: Secondary | ICD-10-CM

## 2016-05-03 DIAGNOSIS — F329 Major depressive disorder, single episode, unspecified: Secondary | ICD-10-CM | POA: Diagnosis not present

## 2016-05-03 DIAGNOSIS — Z810 Family history of intellectual disabilities: Secondary | ICD-10-CM

## 2016-05-03 DIAGNOSIS — F419 Anxiety disorder, unspecified: Secondary | ICD-10-CM

## 2016-05-03 DIAGNOSIS — F418 Other specified anxiety disorders: Secondary | ICD-10-CM

## 2016-05-03 DIAGNOSIS — Z79899 Other long term (current) drug therapy: Secondary | ICD-10-CM | POA: Diagnosis not present

## 2016-05-03 DIAGNOSIS — Z818 Family history of other mental and behavioral disorders: Secondary | ICD-10-CM | POA: Diagnosis not present

## 2016-05-03 DIAGNOSIS — Z811 Family history of alcohol abuse and dependence: Secondary | ICD-10-CM | POA: Diagnosis not present

## 2016-05-03 MED ORDER — CLONAZEPAM 1 MG PO TABS: 1.0000 mg | ORAL_TABLET | Freq: Every day | ORAL | 3 refills | Status: DC | PRN

## 2016-05-03 MED ORDER — BUSPIRONE HCL 10 MG PO TABS: 10.0000 mg | ORAL_TABLET | Freq: Two times a day (BID) | ORAL | 3 refills | Status: DC
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
## 2016-05-19 DIAGNOSIS — Z961 Presence of intraocular lens: Secondary | ICD-10-CM | POA: Diagnosis not present

## 2016-05-19 LAB — HM DIABETES EYE EXAM

## 2016-06-06 DIAGNOSIS — E1140 Type 2 diabetes mellitus with diabetic neuropathy, unspecified: Secondary | ICD-10-CM | POA: Diagnosis not present

## 2016-06-06 DIAGNOSIS — R410 Disorientation, unspecified: Secondary | ICD-10-CM | POA: Diagnosis not present

## 2016-06-06 DIAGNOSIS — R42 Dizziness and giddiness: Secondary | ICD-10-CM | POA: Diagnosis not present

## 2016-06-06 DIAGNOSIS — Z7982 Long term (current) use of aspirin: Secondary | ICD-10-CM | POA: Diagnosis not present

## 2016-06-06 DIAGNOSIS — Z881 Allergy status to other antibiotic agents status: Secondary | ICD-10-CM | POA: Diagnosis not present

## 2016-06-06 DIAGNOSIS — Z951 Presence of aortocoronary bypass graft: Secondary | ICD-10-CM | POA: Diagnosis not present

## 2016-06-06 DIAGNOSIS — I509 Heart failure, unspecified: Secondary | ICD-10-CM | POA: Diagnosis not present

## 2016-06-06 DIAGNOSIS — R7989 Other specified abnormal findings of blood chemistry: Secondary | ICD-10-CM | POA: Diagnosis not present

## 2016-06-06 DIAGNOSIS — R0989 Other specified symptoms and signs involving the circulatory and respiratory systems: Secondary | ICD-10-CM | POA: Diagnosis not present

## 2016-06-06 DIAGNOSIS — I110 Hypertensive heart disease with heart failure: Secondary | ICD-10-CM | POA: Diagnosis not present

## 2016-06-06 DIAGNOSIS — Z794 Long term (current) use of insulin: Secondary | ICD-10-CM | POA: Diagnosis not present

## 2016-06-06 DIAGNOSIS — I2510 Atherosclerotic heart disease of native coronary artery without angina pectoris: Secondary | ICD-10-CM | POA: Diagnosis not present

## 2016-06-06 DIAGNOSIS — E11618 Type 2 diabetes mellitus with other diabetic arthropathy: Secondary | ICD-10-CM | POA: Diagnosis not present

## 2016-06-06 DIAGNOSIS — Z79899 Other long term (current) drug therapy: Secondary | ICD-10-CM | POA: Diagnosis not present

## 2016-06-06 DIAGNOSIS — R1110 Vomiting, unspecified: Secondary | ICD-10-CM | POA: Diagnosis not present

## 2016-06-06 DIAGNOSIS — Z888 Allergy status to other drugs, medicaments and biological substances status: Secondary | ICD-10-CM | POA: Diagnosis not present

## 2016-06-06 DIAGNOSIS — Z7951 Long term (current) use of inhaled steroids: Secondary | ICD-10-CM | POA: Diagnosis not present

## 2016-06-06 DIAGNOSIS — Z95810 Presence of automatic (implantable) cardiac defibrillator: Secondary | ICD-10-CM | POA: Diagnosis not present

## 2016-06-06 DIAGNOSIS — Z87891 Personal history of nicotine dependence: Secondary | ICD-10-CM | POA: Diagnosis not present

## 2016-06-06 DIAGNOSIS — E162 Hypoglycemia, unspecified: Secondary | ICD-10-CM | POA: Diagnosis not present

## 2016-06-08 ENCOUNTER — Encounter: Payer: Self-pay | Admitting: Internal Medicine

## 2016-06-08 ENCOUNTER — Ambulatory Visit (INDEPENDENT_AMBULATORY_CARE_PROVIDER_SITE_OTHER): Payer: Medicare Other | Admitting: Internal Medicine

## 2016-06-08 VITALS — BP 162/96 | HR 86 | Ht 69.0 in | Wt 237.0 lb

## 2016-06-08 DIAGNOSIS — R079 Chest pain, unspecified: Secondary | ICD-10-CM | POA: Diagnosis not present

## 2016-06-08 DIAGNOSIS — I472 Ventricular tachycardia: Secondary | ICD-10-CM | POA: Diagnosis not present

## 2016-06-08 DIAGNOSIS — I4729 Other ventricular tachycardia: Secondary | ICD-10-CM

## 2016-06-08 DIAGNOSIS — Z95810 Presence of automatic (implantable) cardiac defibrillator: Secondary | ICD-10-CM

## 2016-06-08 LAB — CUP PACEART INCLINIC DEVICE CHECK
Brady Statistic RV Percent Paced: 1 % — CL
HIGH POWER IMPEDANCE MEASURED VALUE: 54 Ohm
HighPow Impedance: 28 Ohm
Implantable Lead Implant Date: 20110706
Implantable Lead Implant Date: 20110706
Implantable Lead Location: 753859
Implantable Lead Model: 184
Implantable Lead Serial Number: 310740
Implantable Pulse Generator Implant Date: 20110706
Lead Channel Impedance Value: 469 Ohm
Lead Channel Impedance Value: 479 Ohm
Lead Channel Pacing Threshold Amplitude: 0.5 V
Lead Channel Pacing Threshold Amplitude: 0.9 V
Lead Channel Pacing Threshold Pulse Width: 0.4 ms
Lead Channel Sensing Intrinsic Amplitude: 20.5 mV
Lead Channel Setting Sensing Sensitivity: 0.5 mV
MDC IDC LEAD LOCATION: 753860
MDC IDC MSMT LEADCHNL RA SENSING INTR AMPL: 1.8 mV
MDC IDC MSMT LEADCHNL RV PACING THRESHOLD PULSEWIDTH: 0.4 ms
MDC IDC PG SERIAL: 168307
MDC IDC SESS DTM: 20180405040000
MDC IDC SET LEADCHNL RA PACING AMPLITUDE: 2 V
MDC IDC SET LEADCHNL RV PACING AMPLITUDE: 2.4 V
MDC IDC SET LEADCHNL RV PACING PULSEWIDTH: 0.4 ms
MDC IDC STAT BRADY RA PERCENT PACED: 1 % — AB

## 2016-06-08 MED ORDER — METOPROLOL SUCCINATE ER 50 MG PO TB24: 50.0000 mg | ORAL_TABLET | Freq: Every day | ORAL | 3 refills | Status: DC

## 2016-06-08 MED ORDER — METOPROLOL SUCCINATE ER 25 MG PO TB24: 25.0000 mg | ORAL_TABLET | Freq: Every day | ORAL | 2 refills | Status: DC

## 2016-06-08 NOTE — Patient Instructions (Addendum)
Medication Instructions:  Your physician has recommended you make the following change in your medication:  Stop metoprolol tartrate. Start Metoprolol succinate 50 mg by mouth daily.    Labwork: none  Testing/Procedures: Your physician has requested that you have an exercise stress myoview. For further information please visit [email or web address]. Please follow instruction sheet, as given.   Follow-Up: Will be determined based on results of stress test.   Remote monitoring is used to monitor your Pacemaker of ICD from home. This monitoring reduces the number of office visits required to check your device to one time per year. It allows [place] to keep an eye on the functioning of your device to ensure it is working properly. You are scheduled for a device check from home on 09/07/16. You may send your transmission at any time that day. If you have a wireless device, the transmission will be sent automatically. After your physician reviews your transmission, you will receive a postcard with your next transmission date.    Any Other Special Instructions Will Be Listed Below (If Applicable).     If you need a refill on your cardiac medications before your next appointment, please call your pharmacy.

## 2016-06-08 NOTE — Progress Notes (Signed)
HPI Mr. Dames returns today for followup. He is a pleasant 51 yo man with a h/o an ICM, chronic systolic heart failure, nonsustained ventricular tachycardia, morbid obesity, and diabetes. In the interim, he continues to experience class II heart failure. No recent ICD shocks and no syncope. He does get dyspnea with exertion. Minimal edema in his left leg. Allergies  Allergen Reactions  . Ace Inhibitors Cough  . Cephalexin Other (See Comments)    Resistant to antibiotic  . Beta Adrenergic Blockers Cough     Current Outpatient Prescriptions  Medication Sig Dispense Refill  . aspirin 325 MG tablet Take 325 mg by mouth daily.      . baclofen (LIORESAL) 10 MG tablet Take 2 tablets (20 mg total) by mouth at bedtime. 180 tablet 3  . busPIRone (BUSPAR) 10 MG tablet Take 1 tablet (10 mg total) by mouth 2 (two) times daily. 60 tablet 3  . Chromium Picolinate 200 MCG CAPS Take 200 mcg by mouth daily.     . clonazePAM (KLONOPIN) 1 MG tablet Take 1 tablet (1 mg total) by mouth daily as needed. 30 tablet 3  . ezetimibe (ZETIA) 10 MG tablet Take 10 mg by mouth daily.    . furosemide (LASIX) 40 MG tablet Take 1 tablet (40 mg total) by mouth daily. 90 tablet 2  . Insulin Glargine-Lixisenatide 100-33 UNT-MCG/ML SOPN Inject 40 Units into the skin daily.     Marland Kitchen losartan (COZAAR) 50 MG tablet Take 1 tablet (50 mg total) by mouth daily. 30 tablet 5  . methylcellulose (ARTIFICIAL TEARS) 1 % ophthalmic solution Place 1 drop into both eyes 2 (two) times daily as needed (dry eyes).    . metoprolol tartrate (LOPRESSOR) 25 MG tablet Take 1 tablet (25 mg total) by mouth 2 (two) times daily. 180 tablet 2  . Multiple Vitamin (MULTIVITAMIN) tablet Take 1 tablet by mouth daily.      . nitroGLYCERIN (NITROSTAT) 0.4 MG SL tablet Place 1 tablet (0.4 mg total) under the tongue every 5 (five) minutes as needed for chest pain. 25 tablet 1  . Omega-3 Fatty Acids (FISH OIL BURP-LESS PO) Take 1 tablet by mouth daily.      Marland Kitchen  Propylhexedrine (BENZEDREX) INHA Place 1 each into the nose 2 (two) times daily as needed (congestion).     . rosuvastatin (CRESTOR) 20 MG tablet Take 1 tablet (20 mg total) by mouth daily. 90 tablet 1   No current facility-administered medications for this visit.      Past Medical History:  Diagnosis Date  . Abnormality of gait 04/08/2013  . AICD (automatic cardioverter/defibrillator) present   . Anxiety    stress  . Anxiety and depression   . CAD (coronary artery disease)    a. OOH MI 6/11; presented with acute CHF; hosp course c/b VF arrest with VDRF, etc;   b s/p CABG: L-LAD/Dx, S-OM/dCFX;  c.Nuclear scan 8/12:  Inferior and inf-lat scar with minimal peri-infarct ischemia, EF 53%.; d.LHC 9/12:  LAD 90%, pD1 (small) 70%, prox large Dx 95%, L-LAD and Dx ok, CFX occluded, S-OM and CFX ok, RCA prox to prox/mid 60-70%.  Medical management     . Cardiac arrest - ventricular fibrillation 08/26/2009  . Carpal tunnel syndrome of right wrist   . Cellulitis 2012   spider bite  . Chronic systolic heart failure ([place])   . CKD (chronic kidney disease)   . Coma ([place])   . Diabetic coma with ketoacidosis ([place])    Occured in  1994 where he spent 30 days in a coma . He develpoed infection of his leg and developing pancreatitis during that  time   . DM2 (diabetes mellitus, type 2) ([place])   . Dysrhythmia   . Foot drop, bilateral 04/08/2013  . Headache(784.0)    sinus  . History of degenerative disc disease   . HLD (hyperlipidemia)   . HTN (hypertension)   . Hypercholesteremia   . Ischemic cardiomyopathy    echo 10/11:  inf-septal and apical HK, EF 35%, mod LAE  . Neuropathy ([place])   . Nocturnal leg cramps   . Obesity   . Pancreatitis 2008  . 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 Presence of permanent cardiac pacemaker   . Retinal detachment    Bilateral, laser surgery on the right  . RLS (restless legs syndrome) 04/22/2015  . Thyroid disease   . Ulnar neuropathy at elbow    bilateral     ROS:   All systems reviewed and negative except as noted in the HPI.   Past Surgical History:  Procedure Laterality Date  . APPENDECTOMY  mid 1990  . CARDIAC DEFIBRILLATOR PLACEMENT    . CARDIAC SURGERY    . CARPAL TUNNEL WITH CUBITAL TUNNEL  02/22/2012   Procedure: CARPAL TUNNEL WITH CUBITAL TUNNEL;  Surgeon: Roseanne Kaufman, MD;  Location: [facility];  Service: Orthopedics;  Laterality: Right;  Right Carpal Tunnel Release/Right Cubital Tunnel Release and Transposition if Necessary with Flexor Pronator Release  . COLONOSCOPY WITH PROPOFOL N/A 11/17/2014   Procedure: COLONOSCOPY WITH PROPOFOL;  Surgeon: Manus Gunning, MD;  Location: [facility];  Service: Gastroenterology;  Laterality: N/A;  . COLONOSCOPY WITH PROPOFOL N/A 01/05/2015   Procedure: COLONOSCOPY WITH PROPOFOL;  Surgeon: Manus Gunning, MD;  Location: [facility];  Service: Gastroenterology;  Laterality: N/A;  . CORONARY ARTERY BYPASS GRAFT    . coronary artery bypass grafting x4  june 29,2011   x 4  . PACEMAKER PLACEMENT    . RETINAL DETACHMENT SURGERY Bilateral   . RETINAL DETACHMENT SURGERY Bilateral    3 months ago  . right sided abdominal cyst removal    . TONSILLECTOMY  1998  . ULNAR NERVE TRANSPOSITION Right      Family History  Problem Relation Age of Onset  . Allergies Mother   . Diabetes Mother   . Hypertension Mother   . Hyperlipidemia Mother   . Dementia Mother   . Colon cancer Mother   . Lung cancer Mother   . Kidney cancer Mother   . Colon polyps Mother   . Stroke Father   . Allergies Father   . Dementia Father   . Alcohol abuse Father   . Colon polyps Father   . Diabetes Sister   . Heart disease Sister   . Anxiety disorder Sister   . Cancer Maternal Grandmother     unknown type  . Schizophrenia Paternal Uncle   . Schizophrenia Cousin      Social History   Social History  . Marital status: Divorced    Spouse name: N/A  . Number of children: 0  . Years of  education: college   Occupational History  . 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 Smoking status: Light Tobacco Smoker    Types: Cigars  . Smokeless tobacco: Current User    Types: Snuff     Comment: Occasional cigar  . Alcohol use No     Comment: rarely  .  Drug use: No  . Sexual activity: No   Other Topics Concern  . Not on file   Social History Narrative   Patient drinks about 6 cups of caffeine daily.   Patient is left handed.     BP (!) 162/96   Pulse 86   Ht 5\' 9"  (1.753 m)   Wt 237 lb (107.5 kg)   SpO2 98%   BMI 35.00 kg/m   Physical Exam:  obese appearing 51 year old man,NAD HEENT: Unremarkable Neck:  7 cm JVD, no thyromegally Back:  No CVA tenderness Lungs:  Clear with no wheezes, rales, or rhonchi. HEART:  Regular rate rhythm, no murmurs, no rubs, no clicks Abd:  Soft, obese, positive bowel sounds, no organomegally, no rebound, no guarding Ext:  2 plus pulses, no edema, no cyanosis, no clubbing Skin:  No rashes no nodules Neuro:  CN II through XII intact, motor grossly intact   DEVICE  Normal device function.  See PaceArt for details.   Assess/Plan: 1. Chronic systolic heart failure - his symptoms are well controlled. He appears euvolemic. He is encouraged to maintain a low sodium diet. 2. NSVT - he is asymptomatic and his VT is mostly well controlled.  3. HTN - his blood pressure is up. I have asked him to try and lose 20 lbs. He states that at home his blood pressure is normal. 4. ICD - his [place] sci device dual chamber device is working normally.   Cristopher Peru, M.D.

## 2016-06-15 ENCOUNTER — Telehealth (HOSPITAL_COMMUNITY): Payer: Self-pay | Admitting: *Deleted

## 2016-06-15 NOTE — Telephone Encounter (Signed)
Patient given detailed instructions per Myocardial Perfusion Study Information Sheet for the test on 06/19/16 Patient notified to arrive 15 minutes early and that it is imperative to arrive on time for appointment to keep from having the test rescheduled.  If you need to cancel or reschedule your appointment, please call the office within 24 hours of your appointment. Failure to do so may result in a cancellation of your appointment, and a $50 no show fee. Patient verbalized understanding. Matthew [name] Jacqueline    

## 2016-06-16 ENCOUNTER — Ambulatory Visit (INDEPENDENT_AMBULATORY_CARE_PROVIDER_SITE_OTHER): Payer: Medicare Other | Admitting: Urology

## 2016-06-16 ENCOUNTER — Other Ambulatory Visit (HOSPITAL_COMMUNITY)
Admission: RE | Admit: 2016-06-16 | Discharge: 2016-06-16 | Disposition: A | Discharge status: Home / Self Care | Payer: Medicare Other | Source: Ambulatory Visit | Attending: Urology | Admitting: Urology

## 2016-06-16 DIAGNOSIS — R3121 Asymptomatic microscopic hematuria: Secondary | ICD-10-CM | POA: Insufficient documentation

## 2016-06-16 DIAGNOSIS — N5201 Erectile dysfunction due to arterial insufficiency: Secondary | ICD-10-CM

## 2016-06-16 DIAGNOSIS — R800 Isolated proteinuria: Secondary | ICD-10-CM | POA: Diagnosis not present

## 2016-06-16 LAB — URINALYSIS, COMPLETE (UACMP) WITH MICROSCOPIC
Bacteria, UA: NONE SEEN
Bilirubin Urine: NEGATIVE
Glucose, UA: 50 mg/dL — AB
Ketones, ur: NEGATIVE mg/dL
Leukocytes, UA: NEGATIVE
Nitrite: NEGATIVE
Protein, ur: >=300 mg/dL — AB
Specific Gravity, Urine: 1.015 (ref 1.005–1.030)
pH: 5 (ref 5.0–8.0)

## 2016-06-19 ENCOUNTER — Ambulatory Visit (HOSPITAL_COMMUNITY): Payer: Medicare Other | Attending: Cardiology

## 2016-06-19 DIAGNOSIS — R9439 Abnormal result of other cardiovascular function study: Secondary | ICD-10-CM | POA: Insufficient documentation

## 2016-06-19 DIAGNOSIS — R079 Chest pain, unspecified: Secondary | ICD-10-CM | POA: Diagnosis not present

## 2016-06-19 LAB — MYOCARDIAL PERFUSION IMAGING
CHL CUP NUCLEAR SSS: 14
LHR: 0.44
LV sys vol: 69 mL
LVDIAVOL: 126 mL (ref 62–150)
Peak HR: 77 {beats}/min
Rest HR: 65 {beats}/min
SDS: 1
SRS: 13
TID: 1.06

## 2016-06-19 MED ORDER — TECHNETIUM TC 99M TETROFOSMIN IV KIT
10.1000 | PACK | Freq: Once | INTRAVENOUS | Status: AC | PRN
  Administered 2016-06-19: 10.1 via INTRAVENOUS
  Filled 2016-06-19: qty 11

## 2016-06-19 MED ORDER — TECHNETIUM TC 99M TETROFOSMIN IV KIT
31.9000 | PACK | Freq: Once | INTRAVENOUS | Status: AC | PRN
  Administered 2016-06-19: 31.9 via INTRAVENOUS
  Filled 2016-06-19: qty 32

## 2016-06-19 MED ORDER — REGADENOSON 0.4 MG/5ML IV SOLN
0.4000 mg | Freq: Once | INTRAVENOUS | Status: AC
  Administered 2016-06-19: 0.4 mg via INTRAVENOUS

## 2016-06-30 ENCOUNTER — Telehealth: Payer: Self-pay | Admitting: Family Medicine

## 2016-06-30 DIAGNOSIS — R809 Proteinuria, unspecified: Secondary | ICD-10-CM | POA: Diagnosis not present

## 2016-06-30 DIAGNOSIS — I10 Essential (primary) hypertension: Secondary | ICD-10-CM | POA: Diagnosis not present

## 2016-06-30 DIAGNOSIS — N183 Chronic kidney disease, stage 3 (moderate): Secondary | ICD-10-CM | POA: Diagnosis not present

## 2016-06-30 NOTE — Telephone Encounter (Signed)
Patient is requesting refill of  losartan (COZAAR) 50 MG tablet  [facility] on [address] in [place]

## 2016-07-03 ENCOUNTER — Other Ambulatory Visit: Payer: Self-pay

## 2016-07-03 DIAGNOSIS — I10 Essential (primary) hypertension: Secondary | ICD-10-CM

## 2016-07-03 MED ORDER — LOSARTAN POTASSIUM 50 MG PO TABS: 50.0000 mg | ORAL_TABLET | Freq: Every day | ORAL | 5 refills | Status: DC

## 2016-07-03 NOTE — Telephone Encounter (Signed)
refilled 

## 2016-07-10 ENCOUNTER — Other Ambulatory Visit (HOSPITAL_COMMUNITY): Payer: Self-pay | Admitting: Nephrology

## 2016-07-10 DIAGNOSIS — N183 Chronic kidney disease, stage 3 unspecified: Secondary | ICD-10-CM

## 2016-07-24 NOTE — Progress Notes (Signed)
This encounter was created in error - please disregard.

## 2016-07-26 ENCOUNTER — Ambulatory Visit (HOSPITAL_COMMUNITY)
Admission: RE | Admit: 2016-07-26 | Discharge: 2016-07-26 | Disposition: A | Discharge status: Home / Self Care | Payer: Medicare Other | Source: Ambulatory Visit | Attending: Nephrology | Admitting: Nephrology

## 2016-07-26 DIAGNOSIS — N183 Chronic kidney disease, stage 3 unspecified: Secondary | ICD-10-CM

## 2016-07-26 DIAGNOSIS — N289 Disorder of kidney and ureter, unspecified: Secondary | ICD-10-CM | POA: Diagnosis not present

## 2016-07-26 DIAGNOSIS — N189 Chronic kidney disease, unspecified: Secondary | ICD-10-CM | POA: Diagnosis not present

## 2016-08-15 ENCOUNTER — Ambulatory Visit (HOSPITAL_COMMUNITY): Payer: Medicare Other | Admitting: Psychiatry

## 2016-08-17 ENCOUNTER — Ambulatory Visit (INDEPENDENT_AMBULATORY_CARE_PROVIDER_SITE_OTHER): Payer: Medicare Other | Admitting: Psychiatry

## 2016-08-17 ENCOUNTER — Encounter (HOSPITAL_COMMUNITY): Payer: Self-pay | Admitting: Psychiatry

## 2016-08-17 VITALS — BP 138/98 | HR 69 | Ht 69.0 in | Wt 235.0 lb

## 2016-08-17 DIAGNOSIS — Z818 Family history of other mental and behavioral disorders: Secondary | ICD-10-CM | POA: Diagnosis not present

## 2016-08-17 DIAGNOSIS — F329 Major depressive disorder, single episode, unspecified: Secondary | ICD-10-CM | POA: Diagnosis not present

## 2016-08-17 DIAGNOSIS — Z811 Family history of alcohol abuse and dependence: Secondary | ICD-10-CM | POA: Diagnosis not present

## 2016-08-17 DIAGNOSIS — Z810 Family history of intellectual disabilities: Secondary | ICD-10-CM

## 2016-08-17 DIAGNOSIS — F411 Generalized anxiety disorder: Secondary | ICD-10-CM

## 2016-08-17 DIAGNOSIS — F418 Other specified anxiety disorders: Secondary | ICD-10-CM

## 2016-08-17 MED ORDER — CLONAZEPAM 1 MG PO TABS: 1.0000 mg | ORAL_TABLET | Freq: Every day | ORAL | 3 refills | Status: DC | PRN

## 2016-08-17 MED ORDER — BUSPIRONE HCL 10 MG PO TABS: 10.0000 mg | ORAL_TABLET | Freq: Two times a day (BID) | ORAL | 3 refills | Status: DC

## 2016-08-17 MED ORDER — FLUOXETINE HCL 20 MG PO CAPS: 20.0000 mg | ORAL_CAPSULE | Freq: Every day | ORAL | 2 refills | Status: DC
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
## 2016-08-29 ENCOUNTER — Ambulatory Visit (HOSPITAL_COMMUNITY): Payer: Self-pay | Admitting: Psychiatry

## 2016-09-07 ENCOUNTER — Ambulatory Visit (INDEPENDENT_AMBULATORY_CARE_PROVIDER_SITE_OTHER): Payer: Medicare Other | Admitting: *Deleted

## 2016-09-07 DIAGNOSIS — I472 Ventricular tachycardia: Secondary | ICD-10-CM | POA: Diagnosis not present

## 2016-09-07 DIAGNOSIS — I4729 Other ventricular tachycardia: Secondary | ICD-10-CM

## 2016-09-07 NOTE — Progress Notes (Signed)
Remote ICD transmission.   

## 2016-09-15 ENCOUNTER — Encounter: Payer: Self-pay | Admitting: Cardiology

## 2016-09-20 ENCOUNTER — Other Ambulatory Visit: Payer: Self-pay | Admitting: Family Medicine

## 2016-09-20 DIAGNOSIS — E1065 Type 1 diabetes mellitus with hyperglycemia: Principal | ICD-10-CM

## 2016-09-20 DIAGNOSIS — E782 Mixed hyperlipidemia: Secondary | ICD-10-CM | POA: Diagnosis not present

## 2016-09-20 DIAGNOSIS — IMO0001 Reserved for inherently not codable concepts without codable children: Secondary | ICD-10-CM

## 2016-09-20 LAB — COMPLETE METABOLIC PANEL WITH GFR
ALK PHOS: 115 U/L (ref 40–115)
ALT: 11 U/L (ref 9–46)
AST: 12 U/L (ref 10–35)
Albumin: 3.8 g/dL (ref 3.6–5.1)
BUN: 30 mg/dL — AB (ref 7–25)
CALCIUM: 9.3 mg/dL (ref 8.6–10.3)
CO2: 24 mmol/L (ref 20–31)
Chloride: 104 mmol/L (ref 98–110)
Creat: 2.41 mg/dL — ABNORMAL HIGH (ref 0.70–1.33)
GFR, Est African American: 34 mL/min — ABNORMAL LOW (ref >=60)
GFR, Est Non African American: 30 mL/min — ABNORMAL LOW (ref >=60)
GLUCOSE: 271 mg/dL — AB (ref 65–99)
Potassium: 5.7 mmol/L — ABNORMAL HIGH (ref 3.5–5.3)
SODIUM: 136 mmol/L (ref 135–146)
Total Bilirubin: 0.6 mg/dL (ref 0.2–1.2)
Total Protein: 6.4 g/dL (ref 6.1–8.1)

## 2016-09-21 ENCOUNTER — Telehealth: Payer: Self-pay | Admitting: Family Medicine

## 2016-09-21 LAB — HEPATIC FUNCTION PANEL
ALBUMIN: 3.7 g/dL (ref 3.6–5.1)
ALK PHOS: 114 U/L (ref 40–115)
ALT: 11 U/L (ref 9–46)
AST: 12 U/L (ref 10–35)
BILIRUBIN INDIRECT: 0.4 mg/dL (ref 0.2–1.2)
Bilirubin, Direct: 0.1 mg/dL (ref <=0.2)
Total Bilirubin: 0.5 mg/dL (ref 0.2–1.2)
Total Protein: 6.4 g/dL (ref 6.1–8.1)

## 2016-09-21 LAB — LIPID PANEL
CHOLESTEROL: 152 mg/dL (ref <200)
HDL: 29 mg/dL — ABNORMAL LOW (ref >40)
LDL Cholesterol: 99 mg/dL (ref <100)
TRIGLYCERIDES: 118 mg/dL (ref <150)
Total CHOL/HDL Ratio: 5.2 Ratio — ABNORMAL HIGH (ref <5.0)
VLDL: 24 mg/dL (ref <30)

## 2016-09-21 NOTE — Telephone Encounter (Signed)
-----   Message from [name], MD sent at 09/20/2016 10:28 PM EDT ----- Pls add hepatic and lipid and vit D. Let him know needs to inc water intake, dehydrated, since potassium slightly elevated ensure not taking any supplements with extra potassium. Will get repeat labs at [facility] and also include the HBa1C and CBc which he needs

## 2016-09-21 NOTE — Telephone Encounter (Signed)
Labs added. Called patient regarding message below. He states he drinks water all the time, that its all he drinks.  He also states the kidney specialist put him back on losartan-potassium.

## 2016-09-21 NOTE — Telephone Encounter (Signed)
Patient called back stating he drinks a lot of water, wants to know if furosemide has anything to do with "dehydration" in labs. Also asked about drinking gatorade. Advised to keep an eye on sugar intake.

## 2016-09-22 ENCOUNTER — Encounter: Payer: Self-pay | Admitting: Neurology

## 2016-09-22 ENCOUNTER — Ambulatory Visit (INDEPENDENT_AMBULATORY_CARE_PROVIDER_SITE_OTHER): Payer: Medicare Other | Admitting: Neurology

## 2016-09-22 VITALS — BP 173/80 | HR 50 | Ht 69.0 in | Wt 236.5 lb

## 2016-09-22 DIAGNOSIS — M21372 Foot drop, left foot: Secondary | ICD-10-CM

## 2016-09-22 DIAGNOSIS — G2581 Restless legs syndrome: Secondary | ICD-10-CM | POA: Diagnosis not present

## 2016-09-22 DIAGNOSIS — R269 Unspecified abnormalities of gait and mobility: Secondary | ICD-10-CM | POA: Diagnosis not present

## 2016-09-22 DIAGNOSIS — E1142 Type 2 diabetes mellitus with diabetic polyneuropathy: Secondary | ICD-10-CM | POA: Diagnosis not present

## 2016-09-22 DIAGNOSIS — M21371 Foot drop, right foot: Secondary | ICD-10-CM
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
## 2016-09-24 ENCOUNTER — Other Ambulatory Visit: Payer: Self-pay | Admitting: Family Medicine

## 2016-09-24 ENCOUNTER — Encounter: Payer: Self-pay | Admitting: Family Medicine

## 2016-09-24 LAB — VITAMIN D 1,25 DIHYDROXY
VITAMIN D 1, 25 (OH) TOTAL: 29 pg/mL (ref 18–72)
VITAMIN D3 1, 25 (OH): 29 pg/mL

## 2016-09-25 NOTE — Telephone Encounter (Signed)
Patient informed of message below, verbalized understanding.  

## 2016-09-25 NOTE — Telephone Encounter (Signed)
pls explain I believe that it has to do with kidney function which is compromissd, not normal, will discuss furgther at next visit,

## 2016-09-26 DIAGNOSIS — I10 Essential (primary) hypertension: Secondary | ICD-10-CM | POA: Diagnosis not present

## 2016-09-26 DIAGNOSIS — Z794 Long term (current) use of insulin: Secondary | ICD-10-CM | POA: Diagnosis not present

## 2016-09-26 DIAGNOSIS — E1165 Type 2 diabetes mellitus with hyperglycemia: Secondary | ICD-10-CM

## 2016-09-26 DIAGNOSIS — N184 Chronic kidney disease, stage 4 (severe): Secondary | ICD-10-CM | POA: Diagnosis not present

## 2016-09-26 DIAGNOSIS — E1122 Type 2 diabetes mellitus with diabetic chronic kidney disease: Secondary | ICD-10-CM | POA: Insufficient documentation

## 2016-09-26 DIAGNOSIS — IMO0002 Reserved for concepts with insufficient information to code with codable children: Secondary | ICD-10-CM | POA: Insufficient documentation

## 2016-09-26 DIAGNOSIS — E1169 Type 2 diabetes mellitus with other specified complication: Secondary | ICD-10-CM

## 2016-09-26 DIAGNOSIS — E1140 Type 2 diabetes mellitus with diabetic neuropathy, unspecified: Secondary | ICD-10-CM | POA: Insufficient documentation

## 2016-09-26 DIAGNOSIS — E785 Hyperlipidemia, unspecified: Secondary | ICD-10-CM | POA: Diagnosis not present

## 2016-09-26 DIAGNOSIS — E113419 Type 2 diabetes mellitus with severe nonproliferative diabetic retinopathy with macular edema, unspecified eye: Secondary | ICD-10-CM

## 2016-09-26 DIAGNOSIS — N183 Chronic kidney disease, stage 3 unspecified: Secondary | ICD-10-CM

## 2016-09-26 DIAGNOSIS — I129 Hypertensive chronic kidney disease with stage 1 through stage 4 chronic kidney disease, or unspecified chronic kidney disease: Secondary | ICD-10-CM

## 2016-09-26 HISTORY — DX: Type 2 diabetes mellitus with diabetic chronic kidney disease: E11.65

## 2016-09-26 HISTORY — DX: Hypertensive chronic kidney disease with stage 1 through stage 4 chronic kidney disease, or unspecified chronic kidney disease: E11.22

## 2016-09-26 HISTORY — DX: Chronic kidney disease, stage 3 unspecified: N18.30

## 2016-09-26 HISTORY — DX: Type 2 diabetes mellitus with diabetic neuropathy, unspecified: E11.40

## 2016-09-26 HISTORY — DX: Reserved for concepts with insufficient information to code with codable children: IMO0002

## 2016-09-26 HISTORY — DX: Type 2 diabetes mellitus with hyperglycemia: E11.3419

## 2016-09-26 HISTORY — DX: Type 2 diabetes mellitus with other specified complication: E11.69

## 2016-09-27 ENCOUNTER — Encounter (HOSPITAL_COMMUNITY): Payer: Self-pay | Admitting: Psychiatry

## 2016-09-27 ENCOUNTER — Ambulatory Visit (INDEPENDENT_AMBULATORY_CARE_PROVIDER_SITE_OTHER): Payer: Medicare Other | Admitting: Psychiatry

## 2016-09-27 VITALS — BP 113/66 | HR 52 | Ht 69.0 in | Wt 232.0 lb

## 2016-09-27 DIAGNOSIS — Z818 Family history of other mental and behavioral disorders: Secondary | ICD-10-CM

## 2016-09-27 DIAGNOSIS — Z810 Family history of intellectual disabilities: Secondary | ICD-10-CM

## 2016-09-27 DIAGNOSIS — F418 Other specified anxiety disorders: Secondary | ICD-10-CM | POA: Diagnosis not present

## 2016-09-27 MED ORDER — BUSPIRONE HCL 10 MG PO TABS: 10.0000 mg | ORAL_TABLET | Freq: Two times a day (BID) | ORAL | 3 refills | Status: DC

## 2016-09-27 MED ORDER — FLUOXETINE HCL 20 MG PO CAPS: 20.0000 mg | ORAL_CAPSULE | Freq: Every day | ORAL | 2 refills | Status: DC
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
## 2016-10-03 LAB — CUP PACEART REMOTE DEVICE CHECK
Brady Statistic RA Percent Paced: 0 %
Brady Statistic RV Percent Paced: 0 %
HIGH POWER IMPEDANCE MEASURED VALUE: 50 Ohm
Implantable Lead Implant Date: 20110706
Implantable Lead Location: 753859
Implantable Lead Model: 184
Implantable Lead Model: 5076
Lead Channel Impedance Value: 481 Ohm
Lead Channel Pacing Threshold Amplitude: 0.5 V
Lead Channel Pacing Threshold Amplitude: 0.9 V
Lead Channel Setting Pacing Amplitude: 2 V
MDC IDC LEAD IMPLANT DT: 20110706
MDC IDC LEAD LOCATION: 753860
MDC IDC LEAD SERIAL: 310740
MDC IDC MSMT BATTERY REMAINING LONGEVITY: 66 mo
MDC IDC MSMT BATTERY REMAINING PERCENTAGE: 74 %
MDC IDC MSMT LEADCHNL RA PACING THRESHOLD PULSEWIDTH: 0.4 ms
MDC IDC MSMT LEADCHNL RV IMPEDANCE VALUE: 453 Ohm
MDC IDC MSMT LEADCHNL RV PACING THRESHOLD PULSEWIDTH: 0.4 ms
MDC IDC PG IMPLANT DT: 20110706
MDC IDC PG SERIAL: 168307
MDC IDC SESS DTM: 20180705121500
MDC IDC SET LEADCHNL RV PACING AMPLITUDE: 2.4 V
MDC IDC SET LEADCHNL RV PACING PULSEWIDTH: 0.4 ms
MDC IDC SET LEADCHNL RV SENSING SENSITIVITY: 0.5 mV

## 2016-10-04 ENCOUNTER — Other Ambulatory Visit: Payer: Self-pay | Admitting: Family Medicine

## 2016-10-06 ENCOUNTER — Ambulatory Visit (INDEPENDENT_AMBULATORY_CARE_PROVIDER_SITE_OTHER): Payer: Medicare Other | Admitting: Urology

## 2016-10-06 DIAGNOSIS — R5383 Other fatigue: Secondary | ICD-10-CM | POA: Diagnosis not present

## 2016-10-06 DIAGNOSIS — N5201 Erectile dysfunction due to arterial insufficiency: Secondary | ICD-10-CM | POA: Diagnosis not present

## 2016-10-10 ENCOUNTER — Ambulatory Visit: Payer: Self-pay | Admitting: Family Medicine

## 2016-10-13 ENCOUNTER — Other Ambulatory Visit: Payer: Self-pay

## 2016-10-13 MED ORDER — ROSUVASTATIN CALCIUM 40 MG PO TABS: 40.0000 mg | ORAL_TABLET | Freq: Every day | ORAL | 1 refills | Status: DC

## 2016-11-10 DIAGNOSIS — E1122 Type 2 diabetes mellitus with diabetic chronic kidney disease: Secondary | ICD-10-CM | POA: Diagnosis not present

## 2016-11-10 DIAGNOSIS — E113419 Type 2 diabetes mellitus with severe nonproliferative diabetic retinopathy with macular edema, unspecified eye: Secondary | ICD-10-CM | POA: Diagnosis not present

## 2016-11-10 DIAGNOSIS — Z794 Long term (current) use of insulin: Secondary | ICD-10-CM | POA: Diagnosis not present

## 2016-11-10 DIAGNOSIS — E1140 Type 2 diabetes mellitus with diabetic neuropathy, unspecified: Secondary | ICD-10-CM | POA: Diagnosis not present

## 2016-11-10 DIAGNOSIS — E1165 Type 2 diabetes mellitus with hyperglycemia: Secondary | ICD-10-CM | POA: Diagnosis not present

## 2016-11-10 DIAGNOSIS — N183 Chronic kidney disease, stage 3 (moderate): Secondary | ICD-10-CM | POA: Diagnosis not present

## 2016-11-10 DIAGNOSIS — I129 Hypertensive chronic kidney disease with stage 1 through stage 4 chronic kidney disease, or unspecified chronic kidney disease: Secondary | ICD-10-CM | POA: Diagnosis not present

## 2016-11-10 DIAGNOSIS — N184 Chronic kidney disease, stage 4 (severe): Secondary | ICD-10-CM | POA: Diagnosis not present

## 2016-11-10 DIAGNOSIS — I10 Essential (primary) hypertension: Secondary | ICD-10-CM | POA: Diagnosis not present

## 2016-11-10 DIAGNOSIS — E785 Hyperlipidemia, unspecified: Secondary | ICD-10-CM | POA: Diagnosis not present

## 2016-11-10 DIAGNOSIS — E1169 Type 2 diabetes mellitus with other specified complication: Secondary | ICD-10-CM | POA: Diagnosis not present

## 2016-11-16 ENCOUNTER — Encounter: Payer: Self-pay | Admitting: Family Medicine

## 2016-11-16 ENCOUNTER — Ambulatory Visit (INDEPENDENT_AMBULATORY_CARE_PROVIDER_SITE_OTHER): Payer: Medicare Other | Admitting: Family Medicine

## 2016-11-16 VITALS — BP 137/78 | HR 54 | Temp 98.6°F | Resp 16 | Ht 69.0 in | Wt 244.8 lb

## 2016-11-16 DIAGNOSIS — E1122 Type 2 diabetes mellitus with diabetic chronic kidney disease: Secondary | ICD-10-CM

## 2016-11-16 DIAGNOSIS — F418 Other specified anxiety disorders: Secondary | ICD-10-CM | POA: Diagnosis not present

## 2016-11-16 DIAGNOSIS — I5022 Chronic systolic (congestive) heart failure: Secondary | ICD-10-CM | POA: Diagnosis not present

## 2016-11-16 DIAGNOSIS — E1022 Type 1 diabetes mellitus with diabetic chronic kidney disease: Secondary | ICD-10-CM | POA: Diagnosis not present

## 2016-11-16 DIAGNOSIS — F17220 Nicotine dependence, chewing tobacco, uncomplicated: Secondary | ICD-10-CM

## 2016-11-16 DIAGNOSIS — Z23 Encounter for immunization: Secondary | ICD-10-CM

## 2016-11-16 DIAGNOSIS — N184 Chronic kidney disease, stage 4 (severe): Secondary | ICD-10-CM

## 2016-11-16 DIAGNOSIS — N183 Chronic kidney disease, stage 3 unspecified: Secondary | ICD-10-CM

## 2016-11-16 DIAGNOSIS — E782 Mixed hyperlipidemia: Secondary | ICD-10-CM | POA: Diagnosis not present

## 2016-11-16 DIAGNOSIS — F17200 Nicotine dependence, unspecified, uncomplicated: Secondary | ICD-10-CM | POA: Diagnosis not present

## 2016-11-16 DIAGNOSIS — E1065 Type 1 diabetes mellitus with hyperglycemia: Secondary | ICD-10-CM

## 2016-11-16 NOTE — Patient Instructions (Addendum)
F/u in 4 ,5 montth call if you need me before  Flu vaccine today  Need fasting lipid, cmp and EGFr end October and hep C screening test  You  Are being referred to nephrologist, and also to cardiology  And to Podiatry, no cutting your toenails, and also to ophthalmology  It is important that you exercise regularly at least 30 minutes 5 times a week. If you develop chest pain, have severe difficulty breathing, or feel very tired, stop exercising immediately and seek medical attention    NO MORE SNUFF!!!!   Thanks for choosing [facility], we consider it a privelige to serve you.

## 2016-11-16 NOTE — Assessment & Plan Note (Addendum)
  Needs to establish with AND be followed by nephrolgy, had started with local nephrologist but has had no follow up, will refer tpo MD nearer to where he lives

## 2016-11-19 ENCOUNTER — Encounter: Payer: Self-pay | Admitting: Family Medicine

## 2016-11-19 NOTE — Assessment & Plan Note (Signed)
Currently stable clinically, annual f/u with Dr Lovena Le due in early November, will refer

## 2016-11-19 NOTE — Assessment & Plan Note (Signed)
Ongoing use of snuff, reports less, no definite quit date set Patient is asked and  confirms current  Nicotine use.  Five to seven minutes of time is spent in counseling the patient of the need to quit nicotine use  Advice to quit is delivered clearly specifically in reducing the risk of developing heart disease, having a stroke, or of developing all types of cancer, especially lung and oral cancer. Improvement in breathing and exercise tolerance and quality of life is also discussed, as is the economic benefit.He already has established heart disease Assessment of willingness to quit or to make an attempt to quit is made and documented  Assistance in quit attempt is made with several and varied options presented, based on patient's desire and need. These include  literature, local classes available, 1800 QUIT NOW number, OTC and prescription medication.  The GOAL to be NICOTINE FREE is re emphasized.  The patient has set a personal goal of either reduction or discontinuation and follow up is arranged between 6 an 16 weeks.

## 2016-11-19 NOTE — Progress Notes (Signed)
Matthew [name]     MRN: [id number]      DOB: [birth year]   HPI Matthew [name] is here for follow up and re-evaluation of chronic medical conditions, medication management and review of any available recent lab and radiology data.  Preventive health is updated, specifically  Cancer screening and Immunization.   Questions or concerns regarding consultations or procedures which the PT has had in the interim are  Addressed.Unfortunately was not taking his diabetic med at prescribed dose and his blood suagr is uncontrolled , now working on this The PT denies any adverse reactions to current medications since the last visit.  There are no new concerns.  There are no specific complaints   ROS Denies recent fever or chills. Denies sinus pressure, nasal congestion, ear pain or sore throat. Denies chest congestion, productive cough or wheezing. Denies chest pains, palpitations and leg swelling Denies abdominal pain, nausea, vomiting,diarrhea or constipation.   Denies dysuria, frequency, hesitancy or incontinence. Denies uncontrolled joint pain, swelling and limitation in mobility. Denies headaches, seizures,  Denies  Uncontrolled depression, anxiety or insomnia. Denies skin break down or rash.   PE BP 137/76  Pulse (!) 54   Temp 98.6 F (37 C) (Other (Comment))   Resp 16   Ht 5\' 9"  (1.753 m)   Wt 244 lb 12 oz (111 kg)   SpO2 98%   BMI 36.14 kg/m   Patient alert and oriented and in no cardiopulmonary distress.  HEENT: No facial asymmetry, EOMI,   oropharynx pink and moist.  Neck supple no JVD, no mass.  Chest: Clear to auscultation bilaterally.  CVS: S1, S2 no murmurs, no S3.Regular rate.  ABD: Soft non tender.   Ext: No edema  MS: Adequate ROM spine, shoulders, hips and knees.  Skin: Intact, no ulcerations or rash noted.  Psych: Good eye contact, normal affect. Memory intact not anxious or depressed appearing.  CNS: CN 2-12 intact, power,  normal throughout.no focal deficits  noted.   Assessment & Plan CKD stage 4 due to type 2 diabetes mellitus ([place])  Needs to establish with AND be followed by nephrolgy, had started with local nephrologist but has had no follow up, will refer tpo MD nearer to where he lives     Diabetes mellitus, insulin dependent (IDDM), uncontrolled Managed by endo, currently uncontrolled however working on this. Foot exam reveals healed ulcers and very poor sensation and circulation needs podiatrist to take care of feet, he also has poor vision and has been  Cutting his own toenails , which is not recommended  Chronic systolic heart failure Currently stable clinically, annual f/u with Dr Lovena Le due in early November, will refer  Depression with anxiety Treated by psychiatry , stable  NICOTINE ADDICTION Ongoing use of snuff, reports less, no definite quit date set Patient is asked and  confirms current  Nicotine use.  Five to seven minutes of time is spent in counseling the patient of the need to quit nicotine use  Advice to quit is delivered clearly specifically in reducing the risk of developing heart disease, having a stroke, or of developing all types of cancer, especially lung and oral cancer. Improvement in breathing and exercise tolerance and quality of life is also discussed, as is the economic benefit.He already has established heart disease Assessment of willingness to quit or to make an attempt to quit is made and documented  Assistance in quit attempt is made with several and varied options presented, based on patient's desire and need.  These include  literature, local classes available, 1800 QUIT NOW number, OTC and prescription medication.  The GOAL to be NICOTINE FREE is re emphasized.  The patient has set a personal goal of either reduction or discontinuation and follow up is arranged between 6 an 16 weeks.

## 2016-11-19 NOTE — Assessment & Plan Note (Signed)
Managed by endo, currently uncontrolled however working on this. Foot exam reveals healed ulcers and very poor sensation and circulation needs podiatrist to take care of feet, he also has poor vision and has been  Cutting his own toenails , which is not recommended

## 2016-11-19 NOTE — Assessment & Plan Note (Signed)
Treated by psychiatry , stable

## 2016-12-06 ENCOUNTER — Other Ambulatory Visit: Payer: Self-pay | Admitting: Internal Medicine

## 2016-12-06 ENCOUNTER — Other Ambulatory Visit: Payer: Self-pay | Admitting: Family Medicine

## 2016-12-07 ENCOUNTER — Ambulatory Visit (INDEPENDENT_AMBULATORY_CARE_PROVIDER_SITE_OTHER): Payer: Medicare Other | Admitting: *Deleted

## 2016-12-07 DIAGNOSIS — I472 Ventricular tachycardia: Secondary | ICD-10-CM

## 2016-12-07 DIAGNOSIS — I4729 Other ventricular tachycardia: Secondary | ICD-10-CM

## 2016-12-07 NOTE — Progress Notes (Signed)
Remote ICD transmission.   

## 2016-12-08 ENCOUNTER — Ambulatory Visit (INDEPENDENT_AMBULATORY_CARE_PROVIDER_SITE_OTHER): Payer: Medicare Other | Admitting: Podiatry

## 2016-12-08 ENCOUNTER — Encounter: Payer: Self-pay | Admitting: Podiatry

## 2016-12-08 VITALS — BP 158/95 | HR 70 | Resp 16

## 2016-12-08 DIAGNOSIS — B351 Tinea unguium: Secondary | ICD-10-CM | POA: Diagnosis not present

## 2016-12-08 DIAGNOSIS — E1142 Type 2 diabetes mellitus with diabetic polyneuropathy: Secondary | ICD-10-CM

## 2016-12-08 LAB — CUP PACEART REMOTE DEVICE CHECK
Battery Remaining Longevity: 60 mo
Battery Remaining Percentage: 73 %
Brady Statistic RA Percent Paced: 1 %
Date Time Interrogation Session: 20181004095100
HighPow Impedance: 47 Ohm
Implantable Lead Implant Date: 20110706
Implantable Lead Model: 184
Implantable Lead Model: 5076
Implantable Lead Serial Number: 310740
Implantable Pulse Generator Implant Date: 20110706
Lead Channel Impedance Value: 419 Ohm
Lead Channel Impedance Value: 456 Ohm
Lead Channel Pacing Threshold Pulse Width: 0.4 ms
Lead Channel Setting Pacing Amplitude: 2.4 V
Lead Channel Setting Pacing Pulse Width: 0.4 ms
MDC IDC LEAD IMPLANT DT: 20110706
MDC IDC LEAD LOCATION: 753859
MDC IDC LEAD LOCATION: 753860
MDC IDC MSMT LEADCHNL RA PACING THRESHOLD AMPLITUDE: 0.5 V
MDC IDC MSMT LEADCHNL RA PACING THRESHOLD PULSEWIDTH: 0.4 ms
MDC IDC MSMT LEADCHNL RV PACING THRESHOLD AMPLITUDE: 0.9 V
MDC IDC SET LEADCHNL RA PACING AMPLITUDE: 2 V
MDC IDC SET LEADCHNL RV SENSING SENSITIVITY: 0.5 mV
MDC IDC STAT BRADY RV PERCENT PACED: 0 %
Pulse Gen Serial Number: 168307
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
## 2016-12-14 ENCOUNTER — Encounter: Payer: Self-pay | Admitting: Cardiology

## 2016-12-25 ENCOUNTER — Encounter: Payer: Self-pay | Admitting: Family Medicine

## 2016-12-27 ENCOUNTER — Ambulatory Visit (HOSPITAL_COMMUNITY): Payer: Medicare Other | Admitting: Psychiatry

## 2017-01-12 ENCOUNTER — Encounter (HOSPITAL_COMMUNITY): Payer: Self-pay | Admitting: Psychiatry

## 2017-01-12 ENCOUNTER — Ambulatory Visit: Payer: Medicare Other | Admitting: Urology

## 2017-01-12 ENCOUNTER — Ambulatory Visit (HOSPITAL_COMMUNITY): Payer: Medicare Other | Admitting: Psychiatry

## 2017-01-12 VITALS — BP 141/73 | HR 71 | Ht 69.0 in | Wt 251.0 lb

## 2017-01-12 DIAGNOSIS — F418 Other specified anxiety disorders: Secondary | ICD-10-CM

## 2017-01-12 DIAGNOSIS — Z736 Limitation of activities due to disability: Secondary | ICD-10-CM

## 2017-01-12 DIAGNOSIS — F17210 Nicotine dependence, cigarettes, uncomplicated: Secondary | ICD-10-CM

## 2017-01-12 DIAGNOSIS — N5201 Erectile dysfunction due to arterial insufficiency: Secondary | ICD-10-CM

## 2017-01-12 DIAGNOSIS — F419 Anxiety disorder, unspecified: Secondary | ICD-10-CM

## 2017-01-12 DIAGNOSIS — M2550 Pain in unspecified joint: Secondary | ICD-10-CM

## 2017-01-12 DIAGNOSIS — H538 Other visual disturbances: Secondary | ICD-10-CM | POA: Diagnosis not present

## 2017-01-12 DIAGNOSIS — Z811 Family history of alcohol abuse and dependence: Secondary | ICD-10-CM | POA: Diagnosis not present

## 2017-01-12 DIAGNOSIS — F332 Major depressive disorder, recurrent severe without psychotic features: Secondary | ICD-10-CM

## 2017-01-12 DIAGNOSIS — M791 Myalgia, unspecified site: Secondary | ICD-10-CM

## 2017-01-12 DIAGNOSIS — Z818 Family history of other mental and behavioral disorders: Secondary | ICD-10-CM | POA: Diagnosis not present

## 2017-01-12 DIAGNOSIS — Z810 Family history of intellectual disabilities: Secondary | ICD-10-CM

## 2017-01-12 MED ORDER — FLUOXETINE HCL 20 MG PO CAPS: 20.0000 mg | ORAL_CAPSULE | Freq: Every day | ORAL | 2 refills | Status: DC

## 2017-01-12 MED ORDER — BUSPIRONE HCL 10 MG PO TABS: 10.0000 mg | ORAL_TABLET | Freq: Two times a day (BID) | ORAL | 3 refills | Status: DC
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
## 2017-01-23 DIAGNOSIS — E1169 Type 2 diabetes mellitus with other specified complication: Secondary | ICD-10-CM | POA: Diagnosis not present

## 2017-01-23 DIAGNOSIS — E1140 Type 2 diabetes mellitus with diabetic neuropathy, unspecified: Secondary | ICD-10-CM | POA: Diagnosis not present

## 2017-01-23 DIAGNOSIS — E785 Hyperlipidemia, unspecified: Secondary | ICD-10-CM | POA: Diagnosis not present

## 2017-01-23 DIAGNOSIS — N183 Chronic kidney disease, stage 3 (moderate): Secondary | ICD-10-CM | POA: Diagnosis not present

## 2017-01-23 DIAGNOSIS — I129 Hypertensive chronic kidney disease with stage 1 through stage 4 chronic kidney disease, or unspecified chronic kidney disease: Secondary | ICD-10-CM | POA: Diagnosis not present

## 2017-01-23 DIAGNOSIS — Z794 Long term (current) use of insulin: Secondary | ICD-10-CM | POA: Diagnosis not present

## 2017-01-23 DIAGNOSIS — I10 Essential (primary) hypertension: Secondary | ICD-10-CM | POA: Diagnosis not present

## 2017-01-23 DIAGNOSIS — E1165 Type 2 diabetes mellitus with hyperglycemia: Secondary | ICD-10-CM | POA: Diagnosis not present

## 2017-01-23 DIAGNOSIS — E113413 Type 2 diabetes mellitus with severe nonproliferative diabetic retinopathy with macular edema, bilateral: Secondary | ICD-10-CM | POA: Diagnosis not present

## 2017-01-23 DIAGNOSIS — E1122 Type 2 diabetes mellitus with diabetic chronic kidney disease: Secondary | ICD-10-CM | POA: Diagnosis not present

## 2017-01-23 DIAGNOSIS — N184 Chronic kidney disease, stage 4 (severe): Secondary | ICD-10-CM | POA: Diagnosis not present

## 2017-02-07 ENCOUNTER — Encounter: Payer: Self-pay | Admitting: Family Medicine

## 2017-02-10 ENCOUNTER — Other Ambulatory Visit: Payer: Self-pay | Admitting: Neurology

## 2017-02-10 ENCOUNTER — Other Ambulatory Visit: Payer: Self-pay | Admitting: Internal Medicine

## 2017-02-19 ENCOUNTER — Other Ambulatory Visit: Payer: Self-pay | Admitting: Family Medicine

## 2017-02-19 DIAGNOSIS — I10 Essential (primary) hypertension: Secondary | ICD-10-CM

## 2017-03-08 ENCOUNTER — Ambulatory Visit (INDEPENDENT_AMBULATORY_CARE_PROVIDER_SITE_OTHER): Payer: Medicare Other | Admitting: *Deleted

## 2017-03-08 DIAGNOSIS — I4729 Other ventricular tachycardia: Secondary | ICD-10-CM

## 2017-03-08 DIAGNOSIS — I472 Ventricular tachycardia: Secondary | ICD-10-CM | POA: Diagnosis not present

## 2017-03-08 NOTE — Progress Notes (Signed)
Remote ICD transmission.   

## 2017-03-09 ENCOUNTER — Encounter: Payer: Self-pay | Admitting: Cardiology

## 2017-03-16 ENCOUNTER — Ambulatory Visit: Payer: Medicare Other | Admitting: Podiatry

## 2017-03-16 DIAGNOSIS — B351 Tinea unguium: Secondary | ICD-10-CM

## 2017-03-16 DIAGNOSIS — E1142 Type 2 diabetes mellitus with diabetic polyneuropathy: Secondary | ICD-10-CM

## 2017-03-16 NOTE — Progress Notes (Signed)
  Subjective:  Patient ID: Matthew [name], male    DOB: 06/17/1965,  MRN: [id number]  Chief Complaint  Patient presents with  . Peripheral Neuropathy    DM type 2 with peripheral neuropathy   52 y.o. male returns for diabetic foot care. Last AMBS was unkown. Reports numbness and tingling in their feet. Denies cramping in legs and thighs.  Objective:  There were no vitals filed for this visit. General AA&O x3. Normal mood and affect.  Vascular Dorsalis pedis pulses present 1+ bilaterally  Posterior tibial pulses present 1+ bilaterally  Capillary refill normal to all digits. Pedal hair growth diminished.  Neurologic Epicritic sensation present bilaterally. Protective sensation with 5.07 monofilament  absent bilaterally.  Dermatologic No open lesions. Interspaces clear of maceration.  Normal skin temperature and turgor. Hyperkeratotic lesions: none bilaterally. Nails: brittle, discoloration yellow., onychomycosis, thickening  Orthopedic: No history of amputation. MMT 5/5 in dorsiflexion, plantarflexion, inversion, and eversion. Normal lower extremity joint ROM without pain or crepitus.   Assessment & Plan:  Patient was evaluated and treated and all questions answered.  Diabetes with DPN, Onychomycosis -Diabetic risk level 2 -At risk foot care provided as below.  Procedure: Nail Debridement Rationale: Patient meets criteria for routine foot care due to Class C findings. Type of Debridement: manual, sharp debridement. Instrumentation: Nail nipper, rotary burr. Number of Nails: 10  Return in about 3 months (around 06/14/2017) for Diabetic Foot Care.

## 2017-03-23 LAB — CUP PACEART REMOTE DEVICE CHECK
Battery Remaining Longevity: 54 mo
Brady Statistic RV Percent Paced: 0 %
Date Time Interrogation Session: 20190104173200
HighPow Impedance: 45 Ohm
Implantable Lead Implant Date: 20110706
Implantable Lead Location: 753859
Implantable Lead Location: 753860
Implantable Lead Model: 184
Implantable Lead Model: 5076
Implantable Pulse Generator Implant Date: 20110706
Lead Channel Impedance Value: 430 Ohm
Lead Channel Pacing Threshold Amplitude: 0.5 V
Lead Channel Pacing Threshold Amplitude: 0.9 V
Lead Channel Pacing Threshold Pulse Width: 0.4 ms
Lead Channel Setting Pacing Amplitude: 2 V
Lead Channel Setting Pacing Pulse Width: 0.4 ms
MDC IDC LEAD IMPLANT DT: 20110706
MDC IDC LEAD SERIAL: 310740
MDC IDC MSMT BATTERY REMAINING PERCENTAGE: 66 %
MDC IDC MSMT LEADCHNL RA PACING THRESHOLD PULSEWIDTH: 0.4 ms
MDC IDC MSMT LEADCHNL RV IMPEDANCE VALUE: 410 Ohm
MDC IDC PG SERIAL: 168307
MDC IDC SET LEADCHNL RV PACING AMPLITUDE: 2.4 V
MDC IDC SET LEADCHNL RV SENSING SENSITIVITY: 0.5 mV
MDC IDC STAT BRADY RA PERCENT PACED: 1 %

## 2017-03-30 DIAGNOSIS — E782 Mixed hyperlipidemia: Secondary | ICD-10-CM | POA: Diagnosis not present

## 2017-03-30 DIAGNOSIS — N183 Chronic kidney disease, stage 3 (moderate): Secondary | ICD-10-CM | POA: Diagnosis not present

## 2017-03-30 DIAGNOSIS — E1122 Type 2 diabetes mellitus with diabetic chronic kidney disease: Secondary | ICD-10-CM | POA: Diagnosis not present

## 2017-04-02 ENCOUNTER — Encounter: Payer: Self-pay | Admitting: Neurology

## 2017-04-02 ENCOUNTER — Ambulatory Visit: Payer: Medicare Other | Admitting: Neurology

## 2017-04-02 VITALS — BP 140/75 | HR 65 | Ht 69.0 in | Wt 261.5 lb

## 2017-04-02 DIAGNOSIS — G4761 Periodic limb movement disorder: Secondary | ICD-10-CM | POA: Diagnosis not present

## 2017-04-02 DIAGNOSIS — E1142 Type 2 diabetes mellitus with diabetic polyneuropathy: Secondary | ICD-10-CM | POA: Diagnosis not present

## 2017-04-02 DIAGNOSIS — M21371 Foot drop, right foot: Secondary | ICD-10-CM | POA: Diagnosis not present

## 2017-04-02 DIAGNOSIS — M21372 Foot drop, left foot: Secondary | ICD-10-CM | POA: Diagnosis not present

## 2017-04-02 DIAGNOSIS — R269 Unspecified abnormalities of gait and mobility: Secondary | ICD-10-CM

## 2017-04-02 DIAGNOSIS — G4762 Sleep related leg cramps: Secondary | ICD-10-CM

## 2017-04-02 HISTORY — DX: Periodic limb movement disorder: G47.61

## 2017-04-02 HISTORY — DX: Sleep related leg cramps: G47.62

## 2017-04-02 MED ORDER — PRAMIPEXOLE DIHYDROCHLORIDE 0.125 MG PO TABS: 0.1250 mg | ORAL_TABLET | Freq: Every day | ORAL | 3 refills | Status: DC

## 2017-04-02 MED ORDER — BACLOFEN 20 MG PO TABS: 20.0000 mg | ORAL_TABLET | Freq: Three times a day (TID) | ORAL | 3 refills | Status: DC

## 2017-04-02 NOTE — Patient Instructions (Signed)
    We will add Mirapex 0.125 mg at night for the leg jerks. Call for any dose adjustments.   Mirapex (pramipexole) may result in confusion or hallucinations, dizziness, drowsiness, or nausea. If any significant side effects are noted, please contact our office.

## 2017-04-02 NOTE — Progress Notes (Signed)
Reason for visit: Peripheral neuropathy  Matthew [name] is an 52 y.o. male  History of present illness:  Matthew [name] is a 52 year old left-handed white male with a history of a diabetic peripheral neuropathy.  The patient has gait instability associated with this.  He has not fallen since last seen, he may stumble on occasion, he uses a walking stick when he is outside the house.  The patient has bilateral foot drops, he has weakness in both hands.  The patient does report some nocturnal leg cramps that is well controlled with 20 mg of baclofen at night.  He has developed some periodic limb movements throughout the night that seem to keep him awake.  The patient returns this office for an evaluation.  Past Medical History:  Diagnosis Date  . Abnormality of gait 04/08/2013  . AICD (automatic cardioverter/defibrillator) present   . Anxiety    stress  . Anxiety and depression   . CAD (coronary artery disease)    a. OOH MI 6/11; presented with acute CHF; hosp course c/b VF arrest with VDRF, etc;   b s/p CABG: L-LAD/Dx, S-OM/dCFX;  c.Nuclear scan 8/12:  Inferior and inf-lat scar with minimal peri-infarct ischemia, EF 53%.; d.LHC 9/12:  LAD 90%, pD1 (small) 70%, prox large Dx 95%, L-LAD and Dx ok, CFX occluded, S-OM and CFX ok, RCA prox to prox/mid 60-70%.  Medical management     . Cardiac arrest - ventricular fibrillation 08/26/2009  . Carpal tunnel syndrome of right wrist   . Cellulitis 2012   spider bite  . Chronic systolic heart failure ([place])   . CKD (chronic kidney disease)   . Coma ([place])   . Diabetic coma with ketoacidosis ([place])    Occured in 1994 where he spent 30 days in a coma . He develpoed infection of his leg and developing pancreatitis during that  time   . DM2 (diabetes mellitus, type 2) ([place])   . Dysrhythmia   . Foot drop, bilateral 04/08/2013  . Headache(784.0)    sinus  . History of degenerative disc disease   . HLD (hyperlipidemia)   . HTN (hypertension)   .  Hypercholesteremia   . Ischemic cardiomyopathy    echo 10/11:  inf-septal and apical HK, EF 35%, mod LAE  . Neuropathy   . Nocturnal leg cramps   . Obesity   . Pancreatitis 2008  . 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 Presence of permanent cardiac pacemaker   . Retinal detachment    Bilateral, laser surgery on the right  . RLS (restless legs syndrome) 04/22/2015  . Thyroid disease   . 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 APPENDECTOMY  mid 1990  . CARDIAC DEFIBRILLATOR PLACEMENT    . CARDIAC SURGERY    . CARPAL TUNNEL WITH CUBITAL TUNNEL  02/22/2012   Procedure: CARPAL TUNNEL WITH CUBITAL TUNNEL;  Surgeon: Roseanne Kaufman, MD;  Location: [facility];  Service: Orthopedics;  Laterality: Right;  Right Carpal Tunnel Release/Right Cubital Tunnel Release and Transposition if Necessary with Flexor Pronator Release  . COLONOSCOPY WITH PROPOFOL N/A 11/17/2014   Procedure: COLONOSCOPY WITH PROPOFOL;  Surgeon: Manus Gunning, MD;  Location: [facility];  Service: Gastroenterology;  Laterality: N/A;  . COLONOSCOPY WITH PROPOFOL N/A 01/05/2015   Procedure: COLONOSCOPY WITH PROPOFOL;  Surgeon: Manus Gunning, MD;  Location: [facility];  Service: Gastroenterology;  Laterality: N/A;  . CORONARY ARTERY BYPASS GRAFT    .  coronary artery bypass grafting x4  june 29,2011   x 4  . PACEMAKER PLACEMENT    . RETINAL DETACHMENT SURGERY Bilateral   . RETINAL DETACHMENT SURGERY Bilateral    3 months ago  . right sided abdominal cyst removal    . TONSILLECTOMY  1998  . ULNAR NERVE TRANSPOSITION Right     Family History  Problem Relation Age of Onset  . Allergies Mother   . Diabetes Mother   . Hypertension Mother   . Hyperlipidemia Mother   . Dementia Mother   . Colon cancer Mother   . Lung cancer Mother   . Kidney cancer Mother   . Colon polyps Mother   . Stroke Father   . Allergies Father   . Dementia Father   .  Alcohol abuse Father   . Colon polyps Father   . Diabetes Sister   . Heart disease Sister   . Anxiety disorder Sister   . Cancer Maternal Grandmother        unknown type  . Schizophrenia Paternal Uncle   . Schizophrenia Cousin     Social history:  reports that he has been smoking cigars.  His smokeless tobacco use includes snuff. He reports that he does not drink alcohol or use drugs.    Allergies  Allergen Reactions  . Ace Inhibitors Cough  . Cephalexin Other (See Comments)    Resistant to antibiotic  . Beta Adrenergic Blockers Cough    Medications:  Prior to Admission medications   Medication Sig Start Date End Date Taking? Authorizing Provider  aspirin 325 MG tablet Take 325 mg by mouth daily.     Yes [provider]  busPIRone (BUSPAR) 10 MG tablet Take 1 tablet (10 mg total) 2 (two) times daily by mouth. 01/12/17  Yes Cloria Spring, MD  Chromium Picolinate 200 MCG CAPS Take 200 mcg by mouth daily.    Yes [provider]  clonazePAM (KLONOPIN) 1 MG tablet Take 1 tablet (1 mg total) by mouth daily as needed. 08/17/16  Yes Cloria Spring, MD  ezetimibe (ZETIA) 10 MG tablet Take 10 mg by mouth daily.   Yes [provider]  FLUoxetine (PROZAC) 20 MG capsule Take 1 capsule (20 mg total) daily by mouth. 01/12/17 01/12/18 Yes Cloria Spring, MD  furosemide (LASIX) 40 MG tablet Take 1 tablet (40 mg total) by mouth daily. 12/06/16  Yes Evans Lance, MD  Insulin Glargine-Lixisenatide 100-33 UNT-MCG/ML SOPN Inject 40 Units into the skin daily. [place] 02/18/16  Yes [provider]  insulin NPH-regular Human (NOVOLIN 70/30) (70-30) 100 UNIT/ML injection Inject into the skin. 09/26/16  Yes [provider]  Insulin Syringe-Needle U-100 (INSULIN SYRINGE 1CC/31GX5/16") 31G X 5/16" 1 ML MISC by Does not apply route. 09/26/16  Yes [provider]  methylcellulose (ARTIFICIAL TEARS) 1 % ophthalmic solution Place 1 drop into both eyes 2 (two)  times daily as needed (dry eyes).   Yes [provider]  metoprolol succinate (TOPROL-XL) 50 MG 24 hr tablet TAKE 1 TABLET BY MOUTH  DAILY WITH OR IMMEDIATLEY  FOLLOWING A MEAL 02/13/17  Yes Evans Lance, MD  Multiple Vitamin (MULTIVITAMIN) tablet Take 1 tablet by mouth daily.     Yes [provider]  nitroGLYCERIN (NITROSTAT) 0.4 MG SL tablet Place 1 tablet (0.4 mg total) under the tongue every 5 (five) minutes as needed for chest pain. 01/21/16 01/26/20 Yes Evans Lance, MD  Omega-3 Fatty Acids (FISH OIL BURP-LESS PO)  Take 1 tablet by mouth daily.     Yes [provider]  Propylhexedrine Darcella Gasman) INHA Place 1 each into the nose 2 (two) times daily as needed (congestion).    Yes [provider]  rosuvastatin (CRESTOR) 40 MG tablet Take 1 tablet (40 mg total) by mouth daily. 10/13/16  Yes [name], MD  baclofen (LIORESAL) 20 MG tablet Take 1 tablet (20 mg total) by mouth 3 (three) times daily. 04/02/17   Kathrynn Ducking, MD  pramipexole (MIRAPEX) 0.125 MG tablet Take 1 tablet (0.125 mg total) by mouth at bedtime. 04/02/17   Kathrynn Ducking, MD    ROS:  Out of a complete 14 system review of symptoms, the patient complains only of the following symptoms, and all other reviewed systems are negative.  Walking difficulty Numbness, weakness  Blood pressure 140/75, pulse 65, height 5\' 9"  (1.753 m), weight 261 lb 8 oz (118.6 kg).  Physical Exam  General: The patient is alert and cooperative at the time of the examination.  Skin: No significant peripheral edema is noted.   Neurologic Exam  Mental status: The patient is alert and oriented x 3 at the time of the examination. The patient has apparent normal recent and remote memory, with an apparently normal attention span and concentration ability.   Cranial nerves: Facial symmetry is present. Speech is normal, no aphasia or dysarthria is noted. Extraocular movements are full, but with  superior gaze there is exotropia of the right eye. Visual fields are full.  Motor: The patient has good strength in all 4 extremities, with exception of weakness of the intrinsic muscles of the hands bilaterally, bilateral foot drops.  There is wasting of the first dorsal interossei bilaterally.  Sensory examination: Soft touch sensation is symmetric on the face, arms, and legs.  Coordination: The patient has good finger-nose-finger and heel-to-shin bilaterally.  Gait and station: The patient has a slightly wide-based gait.  The patient is able to walk without assistance.  He normally uses a walking stick to ambulate.  Romberg is negative, slightly unsteady.  Reflexes: Deep tendon reflexes are symmetric, but are depressed.   Assessment/Plan:  1.  Diabetic peripheral neuropathy  2.  Nocturnal leg cramps  3.  Periodic limb movement disorder  4.  Gait disturbance  The patient will continue the baclofen, a prescription was sent in.  The patient will be given a small prescription for Mirapex to see if this helps his periodic limb movements.  He will follow-up in 1 year, sooner if needed.  He will call for any dose adjustments of the medication.  Jill Alexanders MD 04/02/2017 10:20 AM  [facility] [address] [place], [place] [address]  Phone [phone] Fax [phone]

## 2017-04-03 LAB — COMPLETE METABOLIC PANEL WITH GFR
AG RATIO: 1.4 (calc) (ref 1.0–2.5)
ALBUMIN MSPROF: 4 g/dL (ref 3.6–5.1)
ALT: 19 U/L (ref 9–46)
AST: 17 U/L (ref 10–35)
Alkaline phosphatase (APISO): 101 U/L (ref 40–115)
BUN / CREAT RATIO: 15 (calc) (ref 6–22)
BUN: 46 mg/dL — ABNORMAL HIGH (ref 7–25)
CALCIUM: 9.7 mg/dL (ref 8.6–10.3)
CO2: 20 mmol/L (ref 20–32)
Chloride: 108 mmol/L (ref 98–110)
Creat: 3.09 mg/dL — ABNORMAL HIGH (ref 0.70–1.33)
GFR, EST AFRICAN AMERICAN: 25 mL/min/{1.73_m2} — AB (ref >=60)
GFR, EST NON AFRICAN AMERICAN: 22 mL/min/{1.73_m2} — AB (ref >=60)
GLOBULIN: 2.8 g/dL (ref 1.9–3.7)
Glucose, Bld: 133 mg/dL — ABNORMAL HIGH (ref 65–99)
Potassium: 4.5 mmol/L (ref 3.5–5.3)
SODIUM: 138 mmol/L (ref 135–146)
Total Bilirubin: 0.5 mg/dL (ref 0.2–1.2)
Total Protein: 6.8 g/dL (ref 6.1–8.1)

## 2017-04-03 LAB — LIPID PANEL
CHOLESTEROL: 127 mg/dL (ref <200)
HDL: 32 mg/dL — AB (ref >40)
LDL CHOLESTEROL (CALC): 73 mg/dL
Non-HDL Cholesterol (Calc): 95 mg/dL (calc) (ref <130)
Total CHOL/HDL Ratio: 4 (calc) (ref <5.0)
Triglycerides: 138 mg/dL (ref <150)

## 2017-04-03 LAB — HEPATITIS C ANTIBODY
Hepatitis C Ab: NONREACTIVE
SIGNAL TO CUT-OFF: 0.03 (ref <1.00)

## 2017-04-09 ENCOUNTER — Encounter: Payer: Self-pay | Admitting: Family Medicine

## 2017-04-09 ENCOUNTER — Ambulatory Visit (INDEPENDENT_AMBULATORY_CARE_PROVIDER_SITE_OTHER): Payer: Medicare Other | Admitting: Family Medicine

## 2017-04-09 ENCOUNTER — Other Ambulatory Visit (HOSPITAL_COMMUNITY)
Admission: RE | Admit: 2017-04-09 | Discharge: 2017-04-09 | Disposition: A | Discharge status: Home / Self Care | Payer: Medicare Other | Source: Ambulatory Visit | Attending: Family Medicine | Admitting: Family Medicine

## 2017-04-09 VITALS — BP 124/82 | HR 58 | Resp 16 | Ht 69.0 in | Wt 259.0 lb

## 2017-04-09 DIAGNOSIS — N183 Chronic kidney disease, stage 3 unspecified: Secondary | ICD-10-CM

## 2017-04-09 DIAGNOSIS — N184 Chronic kidney disease, stage 4 (severe): Secondary | ICD-10-CM

## 2017-04-09 DIAGNOSIS — E1142 Type 2 diabetes mellitus with diabetic polyneuropathy: Secondary | ICD-10-CM

## 2017-04-09 DIAGNOSIS — M25551 Pain in right hip: Secondary | ICD-10-CM

## 2017-04-09 DIAGNOSIS — E782 Mixed hyperlipidemia: Secondary | ICD-10-CM

## 2017-04-09 DIAGNOSIS — E10649 Type 1 diabetes mellitus with hypoglycemia without coma: Secondary | ICD-10-CM

## 2017-04-09 DIAGNOSIS — E1122 Type 2 diabetes mellitus with diabetic chronic kidney disease: Secondary | ICD-10-CM | POA: Diagnosis not present

## 2017-04-09 DIAGNOSIS — I10 Essential (primary) hypertension: Secondary | ICD-10-CM

## 2017-04-09 DIAGNOSIS — F418 Other specified anxiety disorders: Secondary | ICD-10-CM

## 2017-04-09 DIAGNOSIS — L089 Local infection of the skin and subcutaneous tissue, unspecified: Secondary | ICD-10-CM | POA: Diagnosis not present

## 2017-04-09 DIAGNOSIS — B9689 Other specified bacterial agents as the cause of diseases classified elsewhere: Secondary | ICD-10-CM

## 2017-04-09 DIAGNOSIS — I129 Hypertensive chronic kidney disease with stage 1 through stage 4 chronic kidney disease, or unspecified chronic kidney disease: Secondary | ICD-10-CM

## 2017-04-09 NOTE — Patient Instructions (Addendum)
F/u in 4 months.  I will speak with referral staff re your nephrology referral and  We will get back to you  You will be referred to ortho at [facility] re right hip pain and debility   Non fast chem 7 and EGFr, cBC, and TSH On Friday or Saturday  No snuff

## 2017-04-09 NOTE — Progress Notes (Signed)
Matthew [name]     MRN: [id number]      DOB: 04-14-1965   HPI Matthew [name] is here for follow up and re-evaluation of chronic medical conditions, medication management and review of any available recent lab and radiology data.  Preventive health is updated, specifically  Cancer screening and Immunization.   Still has not seen a new nephrologist , needs one desperately, renal function continues to deteriorate, his blood sugar control is also not doing as well as in the past. He is followed regularly by endocrinology, psychiatry, cardiology and urology He cares for and lives with his father and does the best that he can, no support from his half sister reportedly, since their Mom passed The PT denies any adverse reactions to current medications since the last visit.  Increased right hip pain with reduced mobility, also increasedf low back pain wants increased imaging , feels as though leg will give out, ortho in [place] saw him 1 year ago and is stuck unable to to image  Due to pacemaker , will refer to to [place] sore on lower but cheek in past several days, painful but not draining, needs an antibiotic, this complaint voiced at end of visit as I was leaving the room ROS Denies recent fever or chills. Denies sinus pressure, nasal congestion, ear pain or sore throat. Denies chest congestion, productive cough or wheezing. Denies chest pains, palpitations and leg swelling Denies abdominal pain, nausea, vomiting,diarrhea or constipation.   Denies dysuria, frequency, hesitancy or incontinence.  Denies headaches, seizures,  Denies uncontrolled  depression, anxiety or insomnia.sees psychiatry regularly   PE  BP 124/82   Pulse (!) 58   Resp 16   Ht 5\' 9"  (1.753 m)   Wt 259 lb (117.5 kg)   SpO2 98%   BMI 38.25 kg/m   Patient alert and oriented and in no cardiopulmonary distress.  HEENT: No facial asymmetry, EOMI,   oropharynx pink and moist.  Neck supple no JVD, no mass.  Chest:  Clear to auscultation bilaterally.  CVS: S1, S2 no murmurs, no S3.Regular rate.  ABD: Soft non tender.   Ext: No edema  ER:[id number]  ROM lumbar spine,  Adequate in shoulders, decreased in right hip and adequate in knees.  Skin: Intact, no ulcerations or rash noted.Area of concern not examined  Psych: Good eye contact, flat affect. Memory intact not anxious but  depressed appearing.  CNS: CN 2-12 intact, power,  normal throughout.no focal deficits noted.   Assessment & Plan  HYPERTENSION, BENIGN Controlled, no change in medication DASH diet and commitment to daily physical activity for a minimum of 30 minutes discussed and encouraged, as a part of hypertension management. The importance of attaining a healthy weight is also discussed.  BP/Weight 04/09/2017 04/02/2017 12/08/2016 11/16/2016 09/22/2016 08/05/9507 05/06/6710  Systolic BP 458 099 833 825 053 976 734  Diastolic BP 82 75 95 78 80 96 70  Wt. (Lbs) 259 261.5 - 244.75 236.5 237 243  BMI 38.25 38.62 - 36.14 34.92 35 35.88  Some encounter information is confidential and restricted. Go to Review Flowsheets activity to see all data.       Hyperlipidemia Hyperlipidemia:Low fat diet discussed and encouraged.   Lipid Panel  Lab Results  Component Value Date   CHOL 127 03/30/2017   HDL 32 (L) 03/30/2017   LDLCALC 99 09/20/2016   LDLDIRECT 159 (H) 10/02/2012   TRIG 138 03/30/2017   CHOLHDL 4.0 03/30/2017       Hypertension associated  with stage 3 chronic kidney disease due to type 2 diabetes mellitus (HCC) Controlled, no change in medication DASH diet and commitment to daily physical activity for a minimum of 30 minutes discussed and encouraged, as a part of hypertension management. The importance of attaining a healthy weight is also discussed.  BP/Weight 04/09/2017 04/02/2017 12/08/2016 11/16/2016 09/22/2016 08/06/2631 05/08/4560  Systolic BP 563 893 734 287 681 157 262  Diastolic BP 82 75 95 78 80 96 70  Wt. (Lbs) 259 261.5 -  244.75 236.5 237 243  BMI 38.25 38.62 - 36.14 34.92 35 35.88  Some encounter information is confidential and restricted. Go to Review Flowsheets activity to see all data.       CKD stage 4 due to type 2 diabetes mellitus ([place]) Deteriorated needs nephrology to oversee and manage renal disease, will follow up on the referral which has already been placed , the importance of improving his blood sugar control is also stressed so that his kidney function will improve   Depression with anxiety Managed by psychiatry and stable  Hip pain, right Reports worsened hip pain with increased difficulty walking and unsteady gait, wants evaluation at tertiary center, refer to ortho at [facility], ortho in [place] limited in imaging because of pacemake   Skin infection, bacterial Reports infected skin of lower buttock, short course of antibiont prescribed

## 2017-04-10 ENCOUNTER — Ambulatory Visit (HOSPITAL_COMMUNITY): Payer: Self-pay | Admitting: Psychiatry

## 2017-04-10 LAB — MICROALBUMIN / CREATININE URINE RATIO
CREATININE, UR: 50.1 mg/dL
Microalb Creat Ratio: 2046.5 mg/g creat — ABNORMAL HIGH (ref 0.0–30.0)
Microalb, Ur: 1025.3 ug/mL — ABNORMAL HIGH

## 2017-04-11 ENCOUNTER — Other Ambulatory Visit: Payer: Self-pay

## 2017-04-11 ENCOUNTER — Ambulatory Visit (INDEPENDENT_AMBULATORY_CARE_PROVIDER_SITE_OTHER): Payer: Medicare Other | Admitting: Psychiatry

## 2017-04-11 ENCOUNTER — Encounter (HOSPITAL_COMMUNITY): Payer: Self-pay | Admitting: Psychiatry

## 2017-04-11 ENCOUNTER — Telehealth: Payer: Self-pay | Admitting: Family Medicine

## 2017-04-11 VITALS — BP 146/78 | HR 53 | Ht 69.0 in | Wt 255.0 lb

## 2017-04-11 DIAGNOSIS — Z818 Family history of other mental and behavioral disorders: Secondary | ICD-10-CM | POA: Diagnosis not present

## 2017-04-11 DIAGNOSIS — Z811 Family history of alcohol abuse and dependence: Secondary | ICD-10-CM

## 2017-04-11 DIAGNOSIS — M549 Dorsalgia, unspecified: Secondary | ICD-10-CM | POA: Diagnosis not present

## 2017-04-11 DIAGNOSIS — Z810 Family history of intellectual disabilities: Secondary | ICD-10-CM

## 2017-04-11 DIAGNOSIS — M79606 Pain in leg, unspecified: Secondary | ICD-10-CM | POA: Diagnosis not present

## 2017-04-11 DIAGNOSIS — J309 Allergic rhinitis, unspecified: Secondary | ICD-10-CM

## 2017-04-11 DIAGNOSIS — M2550 Pain in unspecified joint: Secondary | ICD-10-CM | POA: Diagnosis not present

## 2017-04-11 DIAGNOSIS — F418 Other specified anxiety disorders: Secondary | ICD-10-CM | POA: Diagnosis not present

## 2017-04-11 DIAGNOSIS — F17210 Nicotine dependence, cigarettes, uncomplicated: Secondary | ICD-10-CM

## 2017-04-11 DIAGNOSIS — Z736 Limitation of activities due to disability: Secondary | ICD-10-CM

## 2017-04-11 MED ORDER — FLUTICASONE PROPIONATE 50 MCG/ACT NA SUSP: 2.0000 | Freq: Every day | NASAL | 3 refills | Status: AC

## 2017-04-11 MED ORDER — BUSPIRONE HCL 10 MG PO TABS: 10.0000 mg | ORAL_TABLET | Freq: Two times a day (BID) | ORAL | 3 refills | Status: DC

## 2017-04-11 MED ORDER — FLUOXETINE HCL 20 MG PO CAPS: 20.0000 mg | ORAL_CAPSULE | Freq: Every day | ORAL | 3 refills | Status: DC
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
## 2017-04-11 NOTE — Telephone Encounter (Signed)
Pt stopped by regarding his antibiotic--Please check on this

## 2017-04-12 ENCOUNTER — Other Ambulatory Visit: Payer: Self-pay | Admitting: Family Medicine

## 2017-04-12 MED ORDER — DOXYCYCLINE HYCLATE 100 MG PO TABS: 100.0000 mg | ORAL_TABLET | Freq: Two times a day (BID) | ORAL | 0 refills | Status: DC

## 2017-04-12 NOTE — Telephone Encounter (Signed)
I don't see anything in the chart but he says an antibiotic should have been sent?

## 2017-04-12 NOTE — Telephone Encounter (Signed)
Pt had c/o skin  Breakdown at " butt cheek" at end of visit, I called him and apologized and have sent in doxycycline for 1 week  to [facility] in [place]

## 2017-04-14 DIAGNOSIS — B9689 Other specified bacterial agents as the cause of diseases classified elsewhere: Secondary | ICD-10-CM | POA: Insufficient documentation

## 2017-04-14 DIAGNOSIS — L089 Local infection of the skin and subcutaneous tissue, unspecified: Secondary | ICD-10-CM

## 2017-04-14 NOTE — Assessment & Plan Note (Signed)
Managed by psychiatry and stable 

## 2017-04-14 NOTE — Assessment & Plan Note (Signed)
Hyperlipidemia:Low fat diet discussed and encouraged.   Lipid Panel  Lab Results  Component Value Date   CHOL 127 03/30/2017   HDL 32 (L) 03/30/2017   LDLCALC 99 09/20/2016   LDLDIRECT 159 (H) 10/02/2012   TRIG 138 03/30/2017   CHOLHDL 4.0 03/30/2017

## 2017-04-14 NOTE — Assessment & Plan Note (Signed)
Controlled, no change in medication DASH diet and commitment to daily physical activity for a minimum of 30 minutes discussed and encouraged, as a part of hypertension management. The importance of attaining a healthy weight is also discussed.  BP/Weight 04/09/2017 04/02/2017 12/08/2016 11/16/2016 09/22/2016 10/10/8255 06/13/3550  Systolic BP 174 715 953 967 289 791 504  Diastolic BP 82 75 95 78 80 96 70  Wt. (Lbs) 259 261.5 - 244.75 236.5 237 243  BMI 38.25 38.62 - 36.14 34.92 35 35.88  Some encounter information is confidential and restricted. Go to Review Flowsheets activity to see all data.

## 2017-04-14 NOTE — Assessment & Plan Note (Signed)
Reports infected skin of lower buttock, short course of antibiont prescribed

## 2017-04-14 NOTE — Assessment & Plan Note (Signed)
Reports worsened hip pain with increased difficulty walking and unsteady gait, wants evaluation at tertiary center, refer to ortho at [facility], ortho in [place] limited in imaging because of pacemake

## 2017-04-14 NOTE — Assessment & Plan Note (Signed)
Controlled, no change in medication DASH diet and commitment to daily physical activity for a minimum of 30 minutes discussed and encouraged, as a part of hypertension management. The importance of attaining a healthy weight is also discussed.  BP/Weight 04/09/2017 04/02/2017 12/08/2016 11/16/2016 09/22/2016 07/11/8467 08/06/9526  Systolic BP 413 244 010 272 536 644 034  Diastolic BP 82 75 95 78 80 96 70  Wt. (Lbs) 259 261.5 - 244.75 236.5 237 243  BMI 38.25 38.62 - 36.14 34.92 35 35.88  Some encounter information is confidential and restricted. Go to Review Flowsheets activity to see all data.

## 2017-04-14 NOTE — Assessment & Plan Note (Signed)
Deteriorated needs nephrology to oversee and manage renal disease, will follow up on the referral which has already been placed , the importance of improving his blood sugar control is also stressed so that his kidney function will improve

## 2017-04-23 ENCOUNTER — Ambulatory Visit (INDEPENDENT_AMBULATORY_CARE_PROVIDER_SITE_OTHER): Payer: Medicare Other

## 2017-04-23 VITALS — BP 130/74 | HR 60 | Resp 16 | Ht 69.0 in | Wt 260.0 lb

## 2017-04-23 DIAGNOSIS — I10 Essential (primary) hypertension: Secondary | ICD-10-CM | POA: Diagnosis not present

## 2017-04-23 DIAGNOSIS — Z0000 Encounter for general adult medical examination without abnormal findings: Secondary | ICD-10-CM | POA: Diagnosis not present

## 2017-04-23 LAB — CBC
HEMATOCRIT: 36.6 % — AB (ref 38.5–50.0)
Hemoglobin: 12 g/dL — ABNORMAL LOW (ref 13.2–17.1)
MCH: 27.9 pg (ref 27.0–33.0)
MCHC: 32.8 g/dL (ref 32.0–36.0)
MCV: 85.1 fL (ref 80.0–100.0)
MPV: 11.2 fL (ref 7.5–12.5)
Platelets: 94 10*3/uL — ABNORMAL LOW (ref 140–400)
RBC: 4.3 10*6/uL (ref 4.20–5.80)
RDW: 14.5 % (ref 11.0–15.0)
WBC: 7.9 10*3/uL (ref 3.8–10.8)

## 2017-04-23 LAB — BASIC METABOLIC PANEL WITH GFR
BUN / CREAT RATIO: 17 (calc) (ref 6–22)
BUN: 47 mg/dL — AB (ref 7–25)
CO2: 27 mmol/L (ref 20–32)
CREATININE: 2.83 mg/dL — AB (ref 0.70–1.33)
Calcium: 9.5 mg/dL (ref 8.6–10.3)
Chloride: 107 mmol/L (ref 98–110)
GFR, Est African American: 28 mL/min/{1.73_m2} — ABNORMAL LOW (ref >=60)
GFR, Est Non African American: 24 mL/min/{1.73_m2} — ABNORMAL LOW (ref >=60)
GLUCOSE: 262 mg/dL — AB (ref 65–139)
Potassium: 4.5 mmol/L (ref 3.5–5.3)
Sodium: 141 mmol/L (ref 135–146)

## 2017-04-23 LAB — TSH: TSH: 1.07 mIU/L (ref 0.40–4.50)

## 2017-04-23 NOTE — Progress Notes (Signed)
Subjective:   Matthew [name] is a 52 y.o. male who presents for Medicare Annual/Subsequent preventive examination.  Review of Systems:         Objective:    Vitals: BP 130/74   Pulse 60   Resp 16   Ht 5\' 9"  (1.753 m)   Wt 260 lb (117.9 kg)   SpO2 97%   BMI 38.40 kg/m   Body mass index is 38.4 kg/m.  Advanced Directives 04/10/2016 11/02/2015 04/22/2015 01/05/2015 11/17/2014 10/20/2014 09/16/2012  Does Patient Have a Medical Advance Directive? No No No No No No Patient does not have advance directive;Patient would not like information  Would patient like information on creating a medical advance directive? Yes (MAU/Ambulatory/Procedural Areas - Information given) Yes - Educational materials given - No - patient declined information - - -  Pre-existing out of facility DNR order (yellow form or pink MOST form) - - - - - - No    Tobacco Social History   Tobacco Use  Smoking Status Former Smoker  . Types: Cigars  Smokeless Tobacco Current User  . Types: Snuff  Tobacco Comment   Occasional cigar     Ready to quit: Not Answered Counseling given: Not Answered Comment: Occasional cigar   Clinical Intake:  Pre-visit preparation completed: Yes  Pain : 0-10 Pain Score: 6  Pain Type: Chronic pain Pain Location: Hip Pain Orientation: Right     Nutritional Status: BMI > 30  Obese Diabetes: Yes CBG done?: No Did pt. bring in CBG monitor from home?: No  How often do you need to have someone help you when you read instructions, pamphlets, or other written materials from your doctor or pharmacy?: 1 - Never What is the last grade level you completed in school?: college grad   Interpreter Needed?: No  Information entered by :: Kate Sable LPN   Past Medical History:  Diagnosis Date  . Abnormality of gait 04/08/2013  . AICD (automatic cardioverter/defibrillator) present   . Anxiety    stress  . Anxiety and depression   . CAD (coronary artery disease)    a. OOH MI 6/11;  presented with acute CHF; hosp course c/b VF arrest with VDRF, etc;   b s/p CABG: L-LAD/Dx, S-OM/dCFX;  c.Nuclear scan 8/12:  Inferior and inf-lat scar with minimal peri-infarct ischemia, EF 53%.; d.LHC 9/12:  LAD 90%, pD1 (small) 70%, prox large Dx 95%, L-LAD and Dx ok, CFX occluded, S-OM and CFX ok, RCA prox to prox/mid 60-70%.  Medical management     . Cardiac arrest - ventricular fibrillation 08/26/2009  . Carpal tunnel syndrome of right wrist   . Cellulitis 2012   spider bite  . Chronic systolic heart failure ([place])   . CKD (chronic kidney disease)   . Coma ([place])   . Diabetic coma with ketoacidosis ([place])    Occured in 1994 where he spent 30 days in a coma . He develpoed infection of his leg and developing pancreatitis during that  time   . DM2 (diabetes mellitus, type 2) ([place])   . Dysrhythmia   . Foot drop, bilateral 04/08/2013  . Headache(784.0)    sinus  . History of degenerative disc disease   . HLD (hyperlipidemia)   . HTN (hypertension)   . Hypercholesteremia   . Ischemic cardiomyopathy    echo 10/11:  inf-septal and apical HK, EF 35%, mod LAE  . Neuropathy   . Nocturnal leg cramps   . Nocturnal leg cramps 04/02/2017  . Obesity   .  Pancreatitis 2008  . Periodic limb movement 04/02/2017  . 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 Presence of permanent cardiac pacemaker   . Retinal detachment    Bilateral, laser surgery on the right  . RLS (restless legs syndrome) 04/22/2015  . Thyroid disease   . 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 APPENDECTOMY  mid 1990  . CARDIAC DEFIBRILLATOR PLACEMENT    . CARDIAC SURGERY    . CARPAL TUNNEL WITH CUBITAL TUNNEL  02/22/2012   Procedure: CARPAL TUNNEL WITH CUBITAL TUNNEL;  Surgeon: Roseanne Kaufman, MD;  Location: [facility];  Service: Orthopedics;  Laterality: Right;  Right Carpal Tunnel Release/Right Cubital Tunnel Release and Transposition if Necessary with Flexor Pronator Release   . COLONOSCOPY WITH PROPOFOL N/A 11/17/2014   Procedure: COLONOSCOPY WITH PROPOFOL;  Surgeon: Manus Gunning, MD;  Location: [facility];  Service: Gastroenterology;  Laterality: N/A;  . COLONOSCOPY WITH PROPOFOL N/A 01/05/2015   Procedure: COLONOSCOPY WITH PROPOFOL;  Surgeon: Manus Gunning, MD;  Location: [facility];  Service: Gastroenterology;  Laterality: N/A;  . CORONARY ARTERY BYPASS GRAFT    . coronary artery bypass grafting x4  june 29,2011   x 4  . PACEMAKER PLACEMENT    . RETINAL DETACHMENT SURGERY Bilateral   . RETINAL DETACHMENT SURGERY Bilateral    3 months ago  . right sided abdominal cyst removal    . TONSILLECTOMY  1998  . ULNAR NERVE TRANSPOSITION Right    Family History  Problem Relation Age of Onset  . Allergies Mother   . Diabetes Mother   . Hypertension Mother   . Hyperlipidemia Mother   . Dementia Mother   . Colon cancer Mother   . Lung cancer Mother   . Kidney cancer Mother   . Colon polyps Mother   . Stroke Father   . Allergies Father   . Dementia Father   . Alcohol abuse Father   . Colon polyps Father   . Diabetes Sister   . Heart disease Sister   . Anxiety disorder Sister   . Cancer Maternal Grandmother        unknown type  . Schizophrenia Paternal Uncle   . Schizophrenia Cousin    Social History   Socioeconomic History  . Marital status: Divorced    Spouse name: None  . Number of children: 0  . Years of education: college  . Highest education level: None  Social Needs  . Financial resource strain: None  . Food insecurity - worry: None  . Food insecurity - inability: None  . Transportation needs - medical: None  . Transportation needs - non-medical: None  Occupational History  . Occupation: Engineer, production: [employer]    Employer: [employer]  Tobacco Use  . Smoking status: Former Smoker    Types: Cigars  . Smokeless tobacco: Current User    Types: Snuff  . Tobacco comment: Occasional cigar  Substance  and Sexual Activity  . Alcohol use: No    Alcohol/week: 0.0 oz    Comment: rarely  . Drug use: No  . Sexual activity: No  Other Topics Concern  . None  Social History Narrative   Patient drinks about 6 cups of caffeine daily.   Patient is left handed.    Outpatient Encounter Medications as of 04/23/2017  Medication Sig  . aspirin 325 MG tablet Take 325 mg by mouth daily.    . baclofen (LIORESAL)  20 MG tablet Take 1 tablet (20 mg total) by mouth 3 (three) times daily.  . busPIRone (BUSPAR) 10 MG tablet Take 1 tablet (10 mg total) by mouth 2 (two) times daily.  . Chromium Picolinate 200 MCG CAPS Take 200 mcg by mouth daily.   Marland Kitchen ezetimibe (ZETIA) 10 MG tablet Take 10 mg by mouth daily.  Marland Kitchen FLUoxetine (PROZAC) 20 MG capsule Take 1 capsule (20 mg total) by mouth daily.  . fluticasone (FLONASE) 50 MCG/ACT nasal spray Place 2 sprays into both nostrils daily.  . furosemide (LASIX) 40 MG tablet Take 1 tablet (40 mg total) by mouth daily.  . Insulin Glargine-Lixisenatide 100-33 UNT-MCG/ML SOPN Inject 40 Units into the skin daily. [place]  . insulin NPH-regular Human (NOVOLIN 70/30) (70-30) 100 UNIT/ML injection Inject into the skin.  . Insulin Syringe-Needle U-100 (INSULIN SYRINGE 1CC/31GX5/16") 31G X 5/16" 1 ML MISC by Does not apply route.  . methylcellulose (ARTIFICIAL TEARS) 1 % ophthalmic solution Place 1 drop into both eyes 2 (two) times daily as needed (dry eyes).  . metoprolol succinate (TOPROL-XL) 50 MG 24 hr tablet TAKE 1 TABLET BY MOUTH  DAILY WITH OR IMMEDIATLEY  FOLLOWING A MEAL  . Multiple Vitamin (MULTIVITAMIN) tablet Take 1 tablet by mouth daily.    . nitroGLYCERIN (NITROSTAT) 0.4 MG SL tablet Place 1 tablet (0.4 mg total) under the tongue every 5 (five) minutes as needed for chest pain.  . Omega-3 Fatty Acids (FISH OIL BURP-LESS PO) Take 1 tablet by mouth daily.    . pramipexole (MIRAPEX) 0.125 MG tablet Take 1 tablet (0.125 mg total) by mouth at bedtime.  Marland Kitchen Propylhexedrine  (BENZEDREX) INHA Place 1 each into the nose 2 (two) times daily as needed (congestion).   . rosuvastatin (CRESTOR) 40 MG tablet Take 1 tablet (40 mg total) by mouth daily.  . [DISCONTINUED] doxycycline (VIBRA-TABS) 100 MG tablet Take 1 tablet (100 mg total) by mouth 2 (two) times daily.   No facility-administered encounter medications on file as of 04/23/2017.     Activities of Daily Living No flowsheet data found.  Patient Care Team: [name], MD as PCP - General Evans Lance, MD as Consulting Physician (Cardiology) Sherren Mocha, MD as Consulting Physician (Cardiology) Armbruster, Carlota Raspberry, MD as Consulting Physician (Gastroenterology)   Assessment:   This is a routine wellness examination for [facility].  Exercise Activities and Dietary recommendations    Goals    . Weight (lb) < 200 lb (90.7 kg)     Patient would like to lose 43 lbs to get to his goal weight of 200 lbs.       Fall Risk Fall Risk  04/23/2017 11/16/2016 04/10/2016 01/07/2013  Falls in the past year? Yes No Yes Yes  Number falls in past yr: 1 - 2 or more -  Injury with Fall? No - No -  Risk for fall due to : - - History of fall(s);Impaired balance/gait History of fall(s);Impaired balance/gait;Impaired vision;Impaired mobility  Follow up - - Falls evaluation completed;Falls prevention discussed -  Comment - - recommend safe strides program with [facility]. -   Is the patient's home free of loose throw rugs in walkways, pet beds, electrical cords, etc?   yes      Grab bars in the bathroom? yes      Handrails on the stairs?   yes      Adequate lighting?   yes  Timed Get Up and Go Performed:   Depression Screen PHQ  2/9 Scores 04/23/2017 11/16/2016 04/10/2016 03/20/2013  PHQ - 2 Score 0 0 0 5  PHQ- 9 Score - - - 19    Cognitive Function     6CIT Screen 04/10/2016  What Year? 0 points  What month? 0 points  What time? 0 points  Count back from 20 0 points  Months in reverse 0 points  Repeat  phrase 0 points  Total Score 0    Immunization History  Administered Date(s) Administered  . Influenza Whole 02/19/2003  . Influenza, Seasonal, Injecte, Preservative Fre 12020-09-2312, 12/11/2013, 06/02/2015, 11/09/2015  . Influenza,inj,Quad PF,6+ Mos 12020-09-2312, 12/11/2013, 06/02/2015, 11/09/2015, 11/16/2016  . Pneumococcal Polysaccharide-23 11/16/2003, 11/09/2015  . Td 02/19/2003  . Tdap 03/20/2013    Qualifies for Shingles Vaccine? yes  Screening Tests Health Maintenance  Topic Date Due  . HEMOGLOBIN A1C  04/06/2017  . OPHTHALMOLOGY EXAM  05/19/2017  . FOOT EXAM  11/19/2017  . URINE MICROALBUMIN  04/09/2018  . TETANUS/TDAP  03/21/2023  . COLONOSCOPY  01/04/2025  . PNEUMOCOCCAL POLYSACCHARIDE VACCINE  Completed  . INFLUENZA VACCINE  Completed  . Hepatitis C Screening  Completed  . HIV Screening  Completed   Cancer Screenings: Lung: Low Dose CT Chest recommended if Age 4-80 years, 30 pack-year currently smoking OR have quit w/in 15years. Patient does not qualify. Colorectal:   Additional Screenings:  Hepatitis B/HIV/Syphillis: Hepatitis C Screening:     Plan:     I have personally reviewed and noted the following in the patient's chart:   . Medical and social history . Use of alcohol, tobacco or illicit drugs  . Current medications and supplements . Functional ability and status . Nutritional status . Physical activity . Advanced directives . List of other physicians . Hospitalizations, surgeries, and ER visits in previous 12 months . Vitals . Screenings to include cognitive, depression, and falls . Referrals and appointments  In addition, I have reviewed and discussed with patient certain preventive protocols, quality metrics, and best practice recommendations. A written personalized care plan for preventive services as well as general preventive health recommendations were provided to patient.     Kate Sable, LPN, LPN  10/14/313

## 2017-04-23 NOTE — Patient Instructions (Signed)
Matthew [name] , Thank you for taking time to come for your Medicare Wellness Visit. I appreciate your ongoing commitment to your health goals. Please review the following plan we discussed and let me know if I can assist you in the future.   Screening recommendations/referrals: Colonoscopy: Due 01/2025 Recommended yearly ophthalmology/optometry visit for glaucoma screening and checkup Recommended yearly dental visit for hygiene and checkup  Vaccinations: Influenza vaccine: Done Pneumococcal vaccine: Done Tdap vaccine: Up to date- Due 01/2024 Shingles vaccine: Ask PCP  Advanced directives: info given   Conditions/risks identified: yes  Next appointment: scheduled  Preventive Care 40-64 Years, Male Preventive care refers to lifestyle choices and visits with your health care provider that can promote health and wellness. What does preventive care include?  A yearly physical exam. This is also called an annual well check.  Dental exams once or twice a year.  Routine eye exams. Ask your health care provider how often you should have your eyes checked.  Personal lifestyle choices, including:  Daily care of your teeth and gums.  Regular physical activity.  Eating a healthy diet.  Avoiding tobacco and drug use.  Limiting alcohol use.  Practicing safe sex.  Taking low-dose aspirin every day starting at age 52. What happens during an annual well check? The services and screenings done by your health care provider during your annual well check will depend on your age, overall health, lifestyle risk factors, and family history of disease. Counseling  Your health care provider may ask you questions about your:  Alcohol use.  Tobacco use.  Drug use.  Emotional well-being.  Home and relationship well-being.  Sexual activity.  Eating habits.  Work and work Statistician. Screening  You may have the following tests or measurements:  Height, weight, and BMI.  Blood  pressure.  Lipid and cholesterol levels. These may be checked every 5 years, or more frequently if you are over 5 years old.  Skin check.  Lung cancer screening. You may have this screening every year starting at age 76 if you have a 30-pack-year history of smoking and currently smoke or have quit within the past 15 years.  Fecal occult blood test (FOBT) of the stool. You may have this test every year starting at age 58.  Flexible sigmoidoscopy or colonoscopy. You may have a sigmoidoscopy every 5 years or a colonoscopy every 10 years starting at age 46.  Prostate cancer screening. Recommendations will vary depending on your family history and other risks.  Hepatitis C blood test.  Hepatitis B blood test.  Sexually transmitted disease (STD) testing.  Diabetes screening. This is done by checking your blood sugar (glucose) after you have not eaten for a while (fasting). You may have this done every 1-3 years. Discuss your test results, treatment options, and if necessary, the need for more tests with your health care provider. Vaccines  Your health care provider may recommend certain vaccines, such as:  Influenza vaccine. This is recommended every year.  Tetanus, diphtheria, and acellular pertussis (Tdap, Td) vaccine. You may need a Td booster every 10 years.  Zoster vaccine. You may need this after age 106.  Pneumococcal 13-valent conjugate (PCV13) vaccine. You may need this if you have certain conditions and have not been vaccinated.  Pneumococcal polysaccharide (PPSV23) vaccine. You may need one or two doses if you smoke cigarettes or if you have certain conditions. Talk to your health care provider about which screenings and vaccines you need and how often you need them.  This information is not intended to replace advice given to you by your health care provider. Make sure you discuss any questions you have with your health care provider. Document Released: 03/19/2015 Document  Revised: 11/10/2015 Document Reviewed: 12/22/2014 Elsevier Interactive Patient Education  2017 [place] Prevention in the Home Falls can cause injuries. They can happen to people of all ages. There are many things you can do to make your home safe and to help prevent falls. What can I do on the outside of my home?  Regularly fix the edges of walkways and driveways and fix any cracks.  Remove anything that might make you trip as you walk through a door, such as a raised step or threshold.  Trim any bushes or trees on the path to your home.  Use bright outdoor lighting.  Clear any walking paths of anything that might make someone trip, such as rocks or tools.  Regularly check to see if handrails are loose or broken. Make sure that both sides of any steps have handrails.  Any raised decks and porches should have guardrails on the edges.  Have any leaves, snow, or ice cleared regularly.  Use sand or salt on walking paths during winter.  Clean up any spills in your garage right away. This includes oil or grease spills. What can I do in the bathroom?  Use night lights.  Install grab bars by the toilet and in the tub and shower. Do not use towel bars as grab bars.  Use non-skid mats or decals in the tub or shower.  If you need to sit down in the shower, use a plastic, non-slip stool.  Keep the floor dry. Clean up any water that spills on the floor as soon as it happens.  Remove soap buildup in the tub or shower regularly.  Attach bath mats securely with double-sided non-slip rug tape.  Do not have throw rugs and other things on the floor that can make you trip. What can I do in the bedroom?  Use night lights.  Make sure that you have a light by your bed that is easy to reach.  Do not use any sheets or blankets that are too big for your bed. They should not hang down onto the floor.  Have a firm chair that has side arms. You can use this for support while you  get dressed.  Do not have throw rugs and other things on the floor that can make you trip. What can I do in the kitchen?  Clean up any spills right away.  Avoid walking on wet floors.  Keep items that you use a lot in easy-to-reach places.  If you need to reach something above you, use a strong step stool that has a grab bar.  Keep electrical cords out of the way.  Do not use floor polish or wax that makes floors slippery. If you must use wax, use non-skid floor wax.  Do not have throw rugs and other things on the floor that can make you trip. What can I do with my stairs?  Do not leave any items on the stairs.  Make sure that there are handrails on both sides of the stairs and use them. Fix handrails that are broken or loose. Make sure that handrails are as long as the stairways.  Check any carpeting to make sure that it is firmly attached to the stairs. Fix any carpet that is loose or worn.  Avoid having  throw rugs at the top or bottom of the stairs. If you do have throw rugs, attach them to the floor with carpet tape.  Make sure that you have a light switch at the top of the stairs and the bottom of the stairs. If you do not have them, ask someone to add them for you. What else can I do to help prevent falls?  Wear shoes that:  Do not have high heels.  Have rubber bottoms.  Are comfortable and fit you well.  Are closed at the toe. Do not wear sandals.  If you use a stepladder:  Make sure that it is fully opened. Do not climb a closed stepladder.  Make sure that both sides of the stepladder are locked into place.  Ask someone to hold it for you, if possible.  Clearly mark and make sure that you can see:  Any grab bars or handrails.  First and last steps.  Where the edge of each step is.  Use tools that help you move around (mobility aids) if they are needed. These include:  Canes.  Walkers.  Scooters.  Crutches.  Turn on the lights when you go into  a dark area. Replace any light bulbs as soon as they burn out.  Set up your furniture so you have a clear path. Avoid moving your furniture around.  If any of your floors are uneven, fix them.  If there are any pets around you, be aware of where they are.  Review your medicines with your doctor. Some medicines can make you feel dizzy. This can increase your chance of falling. Ask your doctor what other things that you can do to help prevent falls. This information is not intended to replace advice given to you by your health care provider. Make sure you discuss any questions you have with your health care provider. Document Released: 12/17/2008 Document Revised: 07/29/2015 Document Reviewed: 03/27/2014 Elsevier Interactive Patient Education  2017 [employer].

## 2017-04-24 DIAGNOSIS — E1122 Type 2 diabetes mellitus with diabetic chronic kidney disease: Secondary | ICD-10-CM | POA: Diagnosis not present

## 2017-04-24 DIAGNOSIS — Z794 Long term (current) use of insulin: Secondary | ICD-10-CM | POA: Diagnosis not present

## 2017-04-24 DIAGNOSIS — E1140 Type 2 diabetes mellitus with diabetic neuropathy, unspecified: Secondary | ICD-10-CM | POA: Diagnosis not present

## 2017-04-24 DIAGNOSIS — I10 Essential (primary) hypertension: Secondary | ICD-10-CM | POA: Diagnosis not present

## 2017-04-24 DIAGNOSIS — E1169 Type 2 diabetes mellitus with other specified complication: Secondary | ICD-10-CM | POA: Diagnosis not present

## 2017-04-24 DIAGNOSIS — E113413 Type 2 diabetes mellitus with severe nonproliferative diabetic retinopathy with macular edema, bilateral: Secondary | ICD-10-CM | POA: Diagnosis not present

## 2017-04-24 DIAGNOSIS — E1165 Type 2 diabetes mellitus with hyperglycemia: Secondary | ICD-10-CM | POA: Diagnosis not present

## 2017-04-24 DIAGNOSIS — N183 Chronic kidney disease, stage 3 (moderate): Secondary | ICD-10-CM | POA: Diagnosis not present

## 2017-04-24 DIAGNOSIS — I129 Hypertensive chronic kidney disease with stage 1 through stage 4 chronic kidney disease, or unspecified chronic kidney disease: Secondary | ICD-10-CM | POA: Diagnosis not present

## 2017-04-24 DIAGNOSIS — N184 Chronic kidney disease, stage 4 (severe): Secondary | ICD-10-CM | POA: Diagnosis not present

## 2017-04-24 DIAGNOSIS — E785 Hyperlipidemia, unspecified: Secondary | ICD-10-CM | POA: Diagnosis not present

## 2017-05-08 ENCOUNTER — Encounter: Payer: Self-pay | Admitting: Family Medicine

## 2017-05-08 ENCOUNTER — Other Ambulatory Visit: Payer: Self-pay | Admitting: Family Medicine

## 2017-05-08 DIAGNOSIS — D649 Anemia, unspecified: Secondary | ICD-10-CM

## 2017-05-08 DIAGNOSIS — D696 Thrombocytopenia, unspecified: Secondary | ICD-10-CM

## 2017-05-29 DIAGNOSIS — Z961 Presence of intraocular lens: Secondary | ICD-10-CM | POA: Diagnosis not present

## 2017-05-29 DIAGNOSIS — H338 Other retinal detachments: Secondary | ICD-10-CM | POA: Diagnosis not present

## 2017-05-29 DIAGNOSIS — H35373 Puckering of macula, bilateral: Secondary | ICD-10-CM | POA: Diagnosis not present

## 2017-05-29 DIAGNOSIS — E113593 Type 2 diabetes mellitus with proliferative diabetic retinopathy without macular edema, bilateral: Secondary | ICD-10-CM | POA: Diagnosis not present

## 2017-05-29 DIAGNOSIS — H40003 Preglaucoma, unspecified, bilateral: Secondary | ICD-10-CM | POA: Diagnosis not present

## 2017-06-02 ENCOUNTER — Other Ambulatory Visit: Payer: Self-pay | Admitting: Internal Medicine

## 2017-06-07 ENCOUNTER — Ambulatory Visit (INDEPENDENT_AMBULATORY_CARE_PROVIDER_SITE_OTHER): Payer: Medicare Other | Admitting: *Deleted

## 2017-06-07 DIAGNOSIS — I4729 Other ventricular tachycardia: Secondary | ICD-10-CM

## 2017-06-07 DIAGNOSIS — I472 Ventricular tachycardia: Secondary | ICD-10-CM

## 2017-06-07 NOTE — Progress Notes (Signed)
Remote ICD transmission.   

## 2017-06-11 DIAGNOSIS — E1140 Type 2 diabetes mellitus with diabetic neuropathy, unspecified: Secondary | ICD-10-CM | POA: Diagnosis not present

## 2017-06-11 DIAGNOSIS — D649 Anemia, unspecified: Secondary | ICD-10-CM | POA: Diagnosis not present

## 2017-06-11 DIAGNOSIS — R801 Persistent proteinuria, unspecified: Secondary | ICD-10-CM | POA: Diagnosis not present

## 2017-06-11 DIAGNOSIS — I129 Hypertensive chronic kidney disease with stage 1 through stage 4 chronic kidney disease, or unspecified chronic kidney disease: Secondary | ICD-10-CM | POA: Diagnosis not present

## 2017-06-11 DIAGNOSIS — N184 Chronic kidney disease, stage 4 (severe): Secondary | ICD-10-CM | POA: Diagnosis not present

## 2017-06-11 DIAGNOSIS — E1122 Type 2 diabetes mellitus with diabetic chronic kidney disease: Secondary | ICD-10-CM | POA: Diagnosis not present

## 2017-06-11 DIAGNOSIS — Z794 Long term (current) use of insulin: Secondary | ICD-10-CM | POA: Diagnosis not present

## 2017-06-13 ENCOUNTER — Encounter: Payer: Self-pay | Admitting: Cardiology

## 2017-06-13 DIAGNOSIS — M545 Low back pain: Secondary | ICD-10-CM | POA: Diagnosis not present

## 2017-06-13 DIAGNOSIS — M25561 Pain in right knee: Secondary | ICD-10-CM | POA: Diagnosis not present

## 2017-06-13 DIAGNOSIS — G5602 Carpal tunnel syndrome, left upper limb: Secondary | ICD-10-CM | POA: Diagnosis not present

## 2017-06-13 DIAGNOSIS — G8929 Other chronic pain: Secondary | ICD-10-CM | POA: Diagnosis not present

## 2017-06-13 DIAGNOSIS — M1711 Unilateral primary osteoarthritis, right knee: Secondary | ICD-10-CM | POA: Diagnosis not present

## 2017-06-13 DIAGNOSIS — M79642 Pain in left hand: Secondary | ICD-10-CM | POA: Diagnosis not present

## 2017-06-13 DIAGNOSIS — M7061 Trochanteric bursitis, right hip: Secondary | ICD-10-CM | POA: Diagnosis not present

## 2017-06-13 DIAGNOSIS — M25551 Pain in right hip: Secondary | ICD-10-CM | POA: Diagnosis not present

## 2017-06-15 ENCOUNTER — Encounter: Payer: Self-pay | Admitting: Podiatry

## 2017-06-15 ENCOUNTER — Ambulatory Visit: Payer: Medicare Other | Admitting: Podiatry

## 2017-06-15 DIAGNOSIS — E1142 Type 2 diabetes mellitus with diabetic polyneuropathy: Secondary | ICD-10-CM | POA: Diagnosis not present

## 2017-06-15 DIAGNOSIS — B351 Tinea unguium: Secondary | ICD-10-CM

## 2017-06-17 NOTE — Progress Notes (Signed)
  Subjective:  Patient ID: Matthew [name], male    DOB: 08/16/1965,  MRN: [id number]  Chief Complaint  Patient presents with  . Nail Problem    debride   52 y.o. male returns for diabetic foot care.  Denies new issues.  Reports numbness and tingling in his feet.  Objective:  There were no vitals filed for this visit. General AA&O x3. Normal mood and affect.  Vascular Dorsalis pedis pulses present 1+ bilaterally  Posterior tibial pulses present 1+ bilaterally  Capillary refill normal to all digits. Pedal hair growth diminished.  Neurologic Epicritic sensation present bilaterally. Protective sensation with 5.07 monofilament  absent bilaterally.  Dermatologic No open lesions. Interspaces clear of maceration.  Normal skin temperature and turgor. Hyperkeratotic lesions: none bilaterally. Nails: brittle, discoloration yellow., onychomycosis, thickening  Orthopedic: No history of amputation. MMT 5/5 in dorsiflexion, plantarflexion, inversion, and eversion. Normal lower extremity joint ROM without pain or crepitus.   Assessment & Plan:  Patient was evaluated and treated and all questions answered.  Diabetes with DPN, Onychomycosis -Diabetic risk level 2 -At risk foot care provided as below.  Procedure: Nail Debridement Rationale: Patient meets criteria for routine foot care due to 1 class B, 2 class C findings Type of Debridement: manual, sharp debridement. Instrumentation: Nail nipper, rotary burr. Number of Nails: 10     Return in about 3 months (around 09/14/2017) for Diabetic Foot Care.

## 2017-06-23 ENCOUNTER — Emergency Department (INDEPENDENT_AMBULATORY_CARE_PROVIDER_SITE_OTHER)
Admission: EM | Admit: 2017-06-23 | Discharge: 2017-06-23 | Disposition: A | Discharge status: Home / Self Care | Payer: Medicare Other | Source: Home / Self Care | Attending: Family Medicine | Admitting: Family Medicine

## 2017-06-23 ENCOUNTER — Encounter: Payer: Self-pay | Admitting: Emergency Medicine

## 2017-06-23 DIAGNOSIS — L03113 Cellulitis of right upper limb: Secondary | ICD-10-CM

## 2017-06-23 LAB — POCT CBC W AUTO DIFF (K'VILLE URGENT CARE)

## 2017-06-23 MED ORDER — DOXYCYCLINE HYCLATE 100 MG PO CAPS: 100.0000 mg | ORAL_CAPSULE | Freq: Two times a day (BID) | ORAL | 0 refills | Status: DC

## 2017-06-23 MED ORDER — CEFTRIAXONE SODIUM 1 G IJ SOLR
1.0000 g | Freq: Once | INTRAMUSCULAR | Status: AC
  Administered 2017-06-23: 1 g via INTRAMUSCULAR

## 2017-06-23 NOTE — ED Provider Notes (Signed)
[name]    CSN: [id number] Arrival date & time: 06/23/17  1329     History   Chief Complaint Chief Complaint  Patient presents with  . Hand Pain  . Wrist Pain    HPI Matthew [name] is a 52 y.o. male.   Patient awoke 3 days ago with pain and redness of his right wrist extending to forearm.  The pain and redness have increased.  He recalls no injury.  He has a past history of right carpal tunnel release 8 to 10 years ago.  No fevers, chills, and sweats.  The history is provided by the patient.  Wrist Pain  This is a new problem. Episode onset: 3 days ago. The problem occurs constantly. The problem has been rapidly worsening. Pertinent negatives include no chest pain, no abdominal pain and no headaches. Exacerbated by: flexion/extension of wrist. Nothing relieves the symptoms. He has tried nothing for the symptoms.    Past Medical History:  Diagnosis Date  . Abnormality of gait 04/08/2013  . AICD (automatic cardioverter/defibrillator) present   . Anxiety    stress  . Anxiety and depression   . CAD (coronary artery disease)    a. OOH MI 6/11; presented with acute CHF; hosp course c/b VF arrest with VDRF, etc;   b s/p CABG: L-LAD/Dx, S-OM/dCFX;  c.Nuclear scan 8/12:  Inferior and inf-lat scar with minimal peri-infarct ischemia, EF 53%.; d.LHC 9/12:  LAD 90%, pD1 (small) 70%, prox large Dx 95%, L-LAD and Dx ok, CFX occluded, S-OM and CFX ok, RCA prox to prox/mid 60-70%.  Medical management     . Cardiac arrest - ventricular fibrillation 08/26/2009  . Carpal tunnel syndrome of right wrist   . Cellulitis 2012   spider bite  . Chronic systolic heart failure ([place])   . CKD (chronic kidney disease)   . Coma ([place])   . Diabetic coma with ketoacidosis ([place])    Occured in 1994 where he spent 30 days in a coma . He develpoed infection of his leg and developing pancreatitis during that  time   . DM2 (diabetes mellitus, type 2) ([place])   . Dysrhythmia   . Foot drop, bilateral  04/08/2013  . Headache(784.0)    sinus  . History of degenerative disc disease   . HLD (hyperlipidemia)   . HTN (hypertension)   . Hypercholesteremia   . Ischemic cardiomyopathy    echo 10/11:  inf-septal and apical HK, EF 35%, mod LAE  . Neuropathy   . Nocturnal leg cramps   . Nocturnal leg cramps 04/02/2017  . Obesity   . Pancreatitis 2008  . Periodic limb movement 04/02/2017  . 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 Presence of permanent cardiac pacemaker   . Retinal detachment    Bilateral, laser surgery on the right  . RLS (restless legs syndrome) 04/22/2015  . Thyroid disease   . Ulnar neuropathy at elbow    bilateral    Patient Active Problem List   Diagnosis Date Noted  . Skin infection, bacterial 04/14/2017  . Periodic limb movement 04/02/2017  . Nocturnal leg cramps 04/02/2017  . Dyslipidemia associated with type 2 diabetes mellitus ([place]) 09/26/2016  . Hypertension associated with stage 3 chronic kidney disease due to type 2 diabetes mellitus ([place]) 09/26/2016  . Uncontrolled type 2 diabetes mellitus with diabetic neuropathy, with long-term current use of insulin ([place]) 09/26/2016  . 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 Uncontrolled type 2 diabetes mellitus with stage 4 chronic kidney disease, with long-term current use of insulin ([place]) 09/26/2016  . Combined forms of age-related cataract of left eye 04/27/2016  . Combined forms of age-related cataract of right eye 04/13/2016  . CKD stage 4 due to type 2 diabetes mellitus ([place]) 04/11/2016  . RLS (restless legs syndrome) 04/22/2015  . Tubular adenoma of colon 01/07/2015  . Bilateral elbow joint pain 09/30/2013  . Left carpal tunnel syndrome 09/30/2013  . ED (erectile dysfunction) 09/30/2013  . Panic disorder with agoraphobia 07/29/2013  . Allergic rhinitis 05/29/2013  . Back pain 05/13/2013    . DDD (degenerative disc disease) 05/13/2013  . Abnormality of gait 04/08/2013  . Diabetic polyneuropathy ([place]) 04/08/2013  . Foot drop, bilateral 04/08/2013  . Hip pain, right 03/23/2013  . Disequilibrium 03/23/2013  . Poor vision 11/28/2012  . OSA (obstructive sleep apnea) 02/02/2012  . Carotid bruit 11/01/2011  . Numbness and tingling in hands 11/01/2011  . Insomnia 07/28/2011  . Anxiety 07/23/2011  . Stress 07/23/2011  . Chronic systolic heart failure ([place])   . Automatic implantable cardioverter-defibrillator in situ 03/10/2010  . HYPERTENSION, BENIGN 12/28/2009  . VENTRICULAR TACHYCARDIA 12/28/2009  . Coronary atherosclerosis of native coronary artery 09/20/2009  . FATIGUE 09/14/2009  . Diabetes mellitus, insulin dependent (IDDM), uncontrolled ([place]) 05/04/2008  . Depression with anxiety 05/04/2008  . Hyperlipidemia 07/10/2007  . Morbid obesity ([place]) 07/10/2007  . NICOTINE ADDICTION 07/10/2007    Past Surgical History:  Procedure Laterality Date  . APPENDECTOMY  mid 1990  . CARDIAC DEFIBRILLATOR PLACEMENT    . CARDIAC SURGERY    . CARPAL TUNNEL WITH CUBITAL TUNNEL  02/22/2012   Procedure: CARPAL TUNNEL WITH CUBITAL TUNNEL;  Surgeon: Roseanne Kaufman, MD;  Location: [facility];  Service: Orthopedics;  Laterality: Right;  Right Carpal Tunnel Release/Right Cubital Tunnel Release and Transposition if Necessary with Flexor Pronator Release  . COLONOSCOPY WITH PROPOFOL N/A 11/17/2014   Procedure: COLONOSCOPY WITH PROPOFOL;  Surgeon: Manus Gunning, MD;  Location: [facility];  Service: Gastroenterology;  Laterality: N/A;  . COLONOSCOPY WITH PROPOFOL N/A 01/05/2015   Procedure: COLONOSCOPY WITH PROPOFOL;  Surgeon: Manus Gunning, MD;  Location: [facility];  Service: Gastroenterology;  Laterality: N/A;  . CORONARY ARTERY BYPASS GRAFT    . coronary artery bypass grafting x4  june 29,2011   x 4  . PACEMAKER PLACEMENT    . RETINAL DETACHMENT SURGERY Bilateral   .  RETINAL DETACHMENT SURGERY Bilateral    3 months ago  . right sided abdominal cyst removal    . TONSILLECTOMY  1998  . ULNAR NERVE TRANSPOSITION Right        Home Medications    Prior to Admission medications   Medication Sig Start Date End Date Taking? Authorizing Provider  aspirin 325 MG tablet Take 325 mg by mouth daily.      [provider]  baclofen (LIORESAL) 20 MG tablet Take 1 tablet (20 mg total) by mouth 3 (three) times daily. 04/02/17   Kathrynn Ducking, MD  busPIRone (BUSPAR) 10 MG tablet Take 1 tablet (10 mg total) by mouth 2 (two) times daily. 04/11/17   Cloria Spring, MD  Chromium Picolinate 200 MCG CAPS Take 200 mcg by mouth daily.     [provider]  doxycycline (VIBRAMYCIN) 100 MG capsule Take 1 capsule (100 mg total) by mouth 2 (two) times daily. Take with food. 06/23/17   Kandra Nicolas, MD  ezetimibe (ZETIA) 10 MG tablet Take 10 mg by mouth daily.    [provider]  FLUoxetine (PROZAC) 20 MG capsule Take 1 capsule (20 mg total) by mouth daily. 04/11/17 04/11/18  Cloria Spring, MD  fluticasone (FLONASE) 50 MCG/ACT nasal spray Place 2 sprays into both nostrils daily. 04/11/17   [name], MD  furosemide (LASIX) 40 MG tablet Take 1 tablet (40 mg total) by mouth daily. 12/06/16   Evans Lance, MD  Insulin Glargine-Lixisenatide 100-33 UNT-MCG/ML SOPN Inject 40 Units into the skin daily. [facility] 02/18/16   [provider]  insulin NPH-regular Human (NOVOLIN 70/30) (70-30) 100 UNIT/ML injection Inject into the skin. 09/26/16   [provider]  Insulin Syringe-Needle U-100 (INSULIN SYRINGE 1CC/31GX5/16") 31G X 5/16" 1 ML MISC by Does not apply route. 09/26/16   [provider]  methylcellulose (ARTIFICIAL TEARS) 1 % ophthalmic solution Place 1 drop into both eyes 2 (two) times daily as needed (dry eyes).    [provider]  metoprolol succinate (TOPROL-XL) 50 MG 24 hr tablet Take 1 tablet (50 mg total) by  mouth daily. Please make overdue appt with Dr. Lovena Le before any more refills. 1st attempt 06/04/17   Evans Lance, MD  Multiple Vitamin (MULTIVITAMIN) tablet Take 1 tablet by mouth daily.      [provider]  nitroGLYCERIN (NITROSTAT) 0.4 MG SL tablet Place 1 tablet (0.4 mg total) under the tongue every 5 (five) minutes as needed for chest pain. 01/21/16 01/26/20  Evans Lance, MD  Omega-3 Fatty Acids (FISH OIL BURP-LESS PO) Take 1 tablet by mouth daily.      [provider]  pramipexole (MIRAPEX) 0.125 MG tablet Take 1 tablet (0.125 mg total) by mouth at bedtime. 04/02/17   Kathrynn Ducking, MD  Propylhexedrine [facility]) INHA Place 1 each into the nose 2 (two) times daily as needed (congestion).     [provider]  rosuvastatin (CRESTOR) 40 MG tablet Take 1 tablet (40 mg total) by mouth daily. 10/13/16   [name], MD    Family History Family History  Problem Relation Age of Onset  . Allergies Mother   . Diabetes Mother   . Hypertension Mother   . Hyperlipidemia Mother   . Dementia Mother   . Colon cancer Mother   . Lung cancer Mother   . Kidney cancer Mother   . Colon polyps Mother   . Stroke Father   . Allergies Father   . Dementia Father   . Alcohol abuse Father   . Colon polyps Father   . Diabetes Sister   . Heart disease Sister   . Anxiety disorder Sister   . Cancer Maternal Grandmother        unknown type  . Schizophrenia Paternal Uncle   . Schizophrenia Cousin     Social History Social History   Tobacco Use  . Smoking status: Former Smoker    Types: Cigars  . Smokeless tobacco: Current User    Types: Snuff  . Tobacco comment: Occasional cigar  Substance Use Topics  . Alcohol use: No    Alcohol/week: 0.0 oz    Comment: rarely  . Drug use: No     Allergies   Ace inhibitors; Cephalexin; and Beta adrenergic blockers   Review of Systems Review of Systems  Constitutional: Negative for chills, diaphoresis, fatigue  and fever.  Cardiovascular: Negative for chest pain.  Gastrointestinal: Negative for abdominal pain.  Neurological: Negative for headaches.  All other systems reviewed and are negative.    Physical Exam Triage Vital Signs ED Triage Vitals  Enc Vitals Group     BP 06/23/17 1356 122/72     Pulse Rate 06/23/17 1356 96     Resp 06/23/17 1356 16     Temp 06/23/17 1356 98.2 F (36.8 C)     Temp Source 06/23/17 1356 Oral     SpO2 06/23/17 1356 98 %     Weight 06/23/17 1358 254 lb 8 oz (115.4 kg)     Height 06/23/17 1358 5\' 9"  (1.753 m)     Head Circumference --      Peak Flow --      Pain Score 06/23/17 1357 7     Pain Loc --      Pain Edu? --      Excl. in [place]? --    No data found.  Updated Vital Signs BP 122/72 (BP Location: Left Arm)   Pulse 96   Temp 98.2 F (36.8 C) (Oral)   Resp 16   Ht 5\' 9"  (1.753 m)   Wt 254 lb 8 oz (115.4 kg)   SpO2 98%   BMI 37.58 kg/m   Visual Acuity Right Eye Distance:   Left Eye Distance:   Bilateral Distance:    Right Eye Near:   Left Eye Near:    Bilateral Near:     Physical Exam  Constitutional: He appears well-developed and well-nourished.  HENT:  Head: Normocephalic.  Eyes: Pupils are equal, round, and reactive to light.  Neck: Neck supple.  Cardiovascular: Normal heart sounds.  Abdominal: There is no tenderness.  Musculoskeletal:       Right shoulder: He exhibits decreased range of motion, tenderness and swelling. He exhibits no bony tenderness and no crepitus.       Arms: Right volar wrist has erythema and mild tenderness to palpation as noted on diagram.  Faint erythema extends to volar forearm.  There is tenderness to palpation over the right epitrochlear node. Distal neurovascular function is intact.   Neurological: He is alert.  Skin: Skin is warm and dry.  Vitals reviewed.    UC Treatments / Results  Labs (all labs ordered are listed, but only abnormal results are displayed) Labs Reviewed  POCT CBC W AUTO  DIFF ([place]):  WBC 17.5; LY 7.5; MO 2.9; GR 89.6; Hgb 12.5; Platelets 134     EKG None Radiology No results found.  Procedures Procedures (including critical care time)  Medications Ordered in UC Medications  cefTRIAXone (ROCEPHIN) injection 1 g (has no administration in time range)     Initial Impression / Assessment and Plan / UC Course  I have reviewed the triage vital signs and the nursing notes.  Pertinent labs & imaging results that were available during my care of the patient were reviewed by me and considered in my medical decision making (see chart for details).    Administered Rocephin 1000mg  IM Begin empiric doxycycline100mg  BID for staph coverage. Followup with Family Doctor in 2 days. Apply heating pad 2 or 3 times daily for about 10 to 15 minutes. If symptoms become significantly worse during the night or over the weekend, proceed to the local emergency room.     Final Clinical Impressions(s) / UC Diagnoses   Final diagnoses:  Cellulitis of right arm    ED Discharge Orders        Ordered    doxycycline (VIBRAMYCIN) 100 MG capsule  2 times  daily     06/23/17 1434           Kandra Nicolas, MD 06/25/17 [phone]

## 2017-06-23 NOTE — Discharge Instructions (Addendum)
Apply heating pad 2 or 3 times daily for about 10 to 15 minutes. If symptoms become significantly worse during the night or over the weekend, proceed to the local emergency room.

## 2017-06-23 NOTE — ED Triage Notes (Signed)
Patient presents to [facility] with C/O pain in the right forearm and wrist area. Rates pain 7/10

## 2017-06-23 NOTE — ED Notes (Signed)
No adverse reaction noted from Rocephin injection.

## 2017-06-25 ENCOUNTER — Encounter: Payer: Self-pay | Admitting: Emergency Medicine

## 2017-06-25 ENCOUNTER — Inpatient Hospital Stay (HOSPITAL_COMMUNITY)
Admission: EM | Admit: 2017-06-25 | Discharge: 2017-07-03 | DRG: 570 | Disposition: A | Discharge status: Home / Self Care | Payer: Medicare Other | Attending: Family Medicine | Admitting: Family Medicine

## 2017-06-25 ENCOUNTER — Emergency Department
Admission: EM | Admit: 2017-06-25 | Discharge: 2017-06-25 | Disposition: A | Discharge status: Home / Self Care | Payer: Medicare Other | Source: Home / Self Care | Attending: Family Medicine | Admitting: Family Medicine

## 2017-06-25 ENCOUNTER — Encounter (HOSPITAL_COMMUNITY): Payer: Self-pay | Admitting: Emergency Medicine

## 2017-06-25 DIAGNOSIS — Z95810 Presence of automatic (implantable) cardiac defibrillator: Secondary | ICD-10-CM | POA: Diagnosis not present

## 2017-06-25 DIAGNOSIS — L03113 Cellulitis of right upper limb: Principal | ICD-10-CM | POA: Diagnosis present

## 2017-06-25 DIAGNOSIS — E785 Hyperlipidemia, unspecified: Secondary | ICD-10-CM | POA: Diagnosis not present

## 2017-06-25 DIAGNOSIS — N179 Acute kidney failure, unspecified: Secondary | ICD-10-CM

## 2017-06-25 DIAGNOSIS — Z794 Long term (current) use of insulin: Secondary | ICD-10-CM | POA: Diagnosis not present

## 2017-06-25 DIAGNOSIS — M65031 Abscess of tendon sheath, right forearm: Secondary | ICD-10-CM | POA: Diagnosis present

## 2017-06-25 DIAGNOSIS — Z8674 Personal history of sudden cardiac arrest: Secondary | ICD-10-CM

## 2017-06-25 DIAGNOSIS — D62 Acute posthemorrhagic anemia: Secondary | ICD-10-CM | POA: Diagnosis not present

## 2017-06-25 DIAGNOSIS — E119 Type 2 diabetes mellitus without complications: Secondary | ICD-10-CM | POA: Diagnosis not present

## 2017-06-25 DIAGNOSIS — E1122 Type 2 diabetes mellitus with diabetic chronic kidney disease: Secondary | ICD-10-CM | POA: Diagnosis not present

## 2017-06-25 DIAGNOSIS — E1142 Type 2 diabetes mellitus with diabetic polyneuropathy: Secondary | ICD-10-CM | POA: Diagnosis not present

## 2017-06-25 DIAGNOSIS — I130 Hypertensive heart and chronic kidney disease with heart failure and stage 1 through stage 4 chronic kidney disease, or unspecified chronic kidney disease: Secondary | ICD-10-CM | POA: Diagnosis not present

## 2017-06-25 DIAGNOSIS — E869 Volume depletion, unspecified: Secondary | ICD-10-CM | POA: Diagnosis present

## 2017-06-25 DIAGNOSIS — I5022 Chronic systolic (congestive) heart failure: Secondary | ICD-10-CM | POA: Diagnosis not present

## 2017-06-25 DIAGNOSIS — Z6837 Body mass index (BMI) 37.0-37.9, adult: Secondary | ICD-10-CM

## 2017-06-25 DIAGNOSIS — L02419 Cutaneous abscess of limb, unspecified: Secondary | ICD-10-CM | POA: Diagnosis not present

## 2017-06-25 DIAGNOSIS — D631 Anemia in chronic kidney disease: Secondary | ICD-10-CM | POA: Diagnosis present

## 2017-06-25 DIAGNOSIS — I255 Ischemic cardiomyopathy: Secondary | ICD-10-CM | POA: Diagnosis present

## 2017-06-25 DIAGNOSIS — E872 Acidosis: Secondary | ICD-10-CM | POA: Diagnosis present

## 2017-06-25 DIAGNOSIS — E7800 Pure hypercholesterolemia, unspecified: Secondary | ICD-10-CM | POA: Diagnosis not present

## 2017-06-25 DIAGNOSIS — Z7982 Long term (current) use of aspirin: Secondary | ICD-10-CM | POA: Diagnosis not present

## 2017-06-25 DIAGNOSIS — T368X5A Adverse effect of other systemic antibiotics, initial encounter: Secondary | ICD-10-CM | POA: Diagnosis present

## 2017-06-25 DIAGNOSIS — L0390 Cellulitis, unspecified: Secondary | ICD-10-CM | POA: Diagnosis present

## 2017-06-25 DIAGNOSIS — Z720 Tobacco use: Secondary | ICD-10-CM

## 2017-06-25 DIAGNOSIS — E1169 Type 2 diabetes mellitus with other specified complication: Secondary | ICD-10-CM | POA: Diagnosis not present

## 2017-06-25 DIAGNOSIS — N184 Chronic kidney disease, stage 4 (severe): Secondary | ICD-10-CM | POA: Diagnosis not present

## 2017-06-25 DIAGNOSIS — N19 Unspecified kidney failure: Secondary | ICD-10-CM

## 2017-06-25 DIAGNOSIS — I2510 Atherosclerotic heart disease of native coronary artery without angina pectoris: Secondary | ICD-10-CM | POA: Diagnosis present

## 2017-06-25 DIAGNOSIS — B9562 Methicillin resistant Staphylococcus aureus infection as the cause of diseases classified elsewhere: Secondary | ICD-10-CM | POA: Diagnosis present

## 2017-06-25 DIAGNOSIS — Z79899 Other long term (current) drug therapy: Secondary | ICD-10-CM

## 2017-06-25 DIAGNOSIS — M726 Necrotizing fasciitis: Secondary | ICD-10-CM | POA: Diagnosis not present

## 2017-06-25 DIAGNOSIS — F419 Anxiety disorder, unspecified: Secondary | ICD-10-CM | POA: Diagnosis present

## 2017-06-25 DIAGNOSIS — E1165 Type 2 diabetes mellitus with hyperglycemia: Secondary | ICD-10-CM

## 2017-06-25 DIAGNOSIS — E669 Obesity, unspecified: Secondary | ICD-10-CM | POA: Diagnosis present

## 2017-06-25 DIAGNOSIS — I509 Heart failure, unspecified: Secondary | ICD-10-CM | POA: Diagnosis present

## 2017-06-25 DIAGNOSIS — I10 Essential (primary) hypertension: Secondary | ICD-10-CM | POA: Diagnosis present

## 2017-06-25 DIAGNOSIS — F329 Major depressive disorder, single episode, unspecified: Secondary | ICD-10-CM | POA: Diagnosis present

## 2017-06-25 DIAGNOSIS — IMO0002 Reserved for concepts with insufficient information to code with codable children: Secondary | ICD-10-CM

## 2017-06-25 DIAGNOSIS — Z951 Presence of aortocoronary bypass graft: Secondary | ICD-10-CM

## 2017-06-25 LAB — URINALYSIS, ROUTINE W REFLEX MICROSCOPIC
Bilirubin Urine: NEGATIVE
GLUCOSE, UA: 50 mg/dL — AB
Ketones, ur: NEGATIVE mg/dL
Leukocytes, UA: NEGATIVE
NITRITE: NEGATIVE
PROTEIN: 100 mg/dL — AB
Specific Gravity, Urine: 1.005 (ref 1.005–1.030)
pH: 5 (ref 5.0–8.0)

## 2017-06-25 LAB — POCT CBC W AUTO DIFF (K'VILLE URGENT CARE)

## 2017-06-25 LAB — BASIC METABOLIC PANEL
ANION GAP: 15 (ref 5–15)
BUN: 66 mg/dL — ABNORMAL HIGH (ref 6–20)
CO2: 15 mmol/L — AB (ref 22–32)
Calcium: 9.6 mg/dL (ref 8.9–10.3)
Chloride: 98 mmol/L — ABNORMAL LOW (ref 101–111)
Creatinine, Ser: 4.54 mg/dL — ABNORMAL HIGH (ref 0.61–1.24)
GFR calc Af Amer: 16 mL/min — ABNORMAL LOW (ref >60)
GFR, EST NON AFRICAN AMERICAN: 13 mL/min — AB (ref >60)
GLUCOSE: 158 mg/dL — AB (ref 65–99)
POTASSIUM: 3.7 mmol/L (ref 3.5–5.1)
Sodium: 128 mmol/L — ABNORMAL LOW (ref 135–145)

## 2017-06-25 LAB — CBC WITH DIFFERENTIAL/PLATELET
BASOS ABS: 0 10*3/uL (ref 0.0–0.1)
Basophils Relative: 0 %
Eosinophils Absolute: 0.1 10*3/uL (ref 0.0–0.7)
Eosinophils Relative: 0 %
HEMATOCRIT: 34.2 % — AB (ref 39.0–52.0)
Hemoglobin: 11.6 g/dL — ABNORMAL LOW (ref 13.0–17.0)
LYMPHS PCT: 6 %
Lymphs Abs: 1 10*3/uL (ref 0.7–4.0)
MCH: 28 pg (ref 26.0–34.0)
MCHC: 33.9 g/dL (ref 30.0–36.0)
MCV: 82.6 fL (ref 78.0–100.0)
MONO ABS: 1.3 10*3/uL — AB (ref 0.1–1.0)
MONOS PCT: 8 %
NEUTROS ABS: 14.1 10*3/uL — AB (ref 1.7–7.7)
Neutrophils Relative %: 86 %
Platelets: 134 10*3/uL — ABNORMAL LOW (ref 150–400)
RBC: 4.14 MIL/uL — ABNORMAL LOW (ref 4.22–5.81)
RDW: 14.8 % (ref 11.5–15.5)
WBC: 16.5 10*3/uL — ABNORMAL HIGH (ref 4.0–10.5)

## 2017-06-25 LAB — I-STAT CG4 LACTIC ACID, ED: Lactic Acid, Venous: 1.68 mmol/L (ref 0.5–1.9)

## 2017-06-25 MED ORDER — VANCOMYCIN HCL 10 G IV SOLR
2000.0000 mg | Freq: Once | INTRAVENOUS | Status: DC
  Filled 2017-06-25: qty 2000

## 2017-06-25 MED ORDER — VANCOMYCIN HCL IN DEXTROSE 1-5 GM/200ML-% IV SOLN: 1000.0000 mg | Freq: Once | INTRAVENOUS | Status: DC

## 2017-06-25 NOTE — ED Triage Notes (Signed)
Pt presents with abscess to R wrist present since last Wednesday, pt was sent from [facility] for further eval. Pt was started on Doxycycline, given IM Rocephin last week. Pt has seen no improvement. Redness extends up to elbow, swelling noted.

## 2017-06-25 NOTE — ED Triage Notes (Signed)
Pt here for recheck on insect bit to right forearm. States it is getting worse. He is taking abx as directed.

## 2017-06-25 NOTE — Progress Notes (Addendum)
Pharmacy Antibiotic Note  Matthew [name] is a 52 y.o. male admitted on 06/25/2017 with worsening cellulitis surrounding an insect bite.  Pharmacy has been consulted for Vancomycin dosing.  The patient is noted to be in AKI with SCr 4.54 (BL 2.5-3), normalized CrCl<20 ml/min.   Plan: 1. Vancomycin 2g IV x 1 to load 2. No standing Vancomycin doses for now due to AKI - will re-order when appropriate 3. Will continue to follow renal function, culture results, LOT, and antibiotic de-escalation plans   Height: 5\' 9"  (175.3 cm) Weight: 255 lb (115.7 kg) IBW/kg (Calculated) : 70.7  Temp (24hrs), Avg:98.9 F (37.2 C), Min:98.9 F (37.2 C), Max:98.9 F (37.2 C)  No results for input(s): WBC, CREATININE, LATICACIDVEN, VANCOTROUGH, VANCOPEAK, VANCORANDOM, GENTTROUGH, GENTPEAK, GENTRANDOM, TOBRATROUGH, TOBRAPEAK, TOBRARND, AMIKACINPEAK, AMIKACINTROU, AMIKACIN in the last 168 hours.  CrCl cannot be calculated (Patient's most recent lab result is older than the maximum 21 days allowed.).    Allergies  Allergen Reactions  . Ace Inhibitors Cough  . Cephalexin Other (See Comments)    Resistant to antibiotic  . Beta Adrenergic Blockers Cough    Antimicrobials this admission: Vanc 4/22 >>   Dose adjustments this admission: n/a  Microbiology results: 4/22 BCx >>  Thank you for allowing pharmacy to be a part of this patient's care.  Alycia Rossetti, PharmD, BCPS Clinical Pharmacist Pager: [phone] Clinical phone for 06/25/2017: [phone] If after 3:30p, please call main pharmacy at: [phone] 06/25/2017 9:00 PM     Addendum: Add Zosyn 3.375g IV Q8H (4h infusion). Wynona Neat, PharmD, BCPS 06/26/2017 3:47 AM

## 2017-06-25 NOTE — ED Provider Notes (Signed)
[name]    CSN: [id number] Arrival date & time: 06/25/17  1617     History   Chief Complaint Chief Complaint  Patient presents with  . Insect Bite    HPI Matthew [name] is a 52 y.o. male.   Patient reports no improvement in his right wrist and forearm after beginning doxycycline.  He has developed increased swelling, erythema, and pain, and continues to have chills/sweats.     Past Medical History:  Diagnosis Date  . Abnormality of gait 04/08/2013  . AICD (automatic cardioverter/defibrillator) present   . Anxiety    stress  . Anxiety and depression   . CAD (coronary artery disease)    a. OOH MI 6/11; presented with acute CHF; hosp course c/b VF arrest with VDRF, etc;   b s/p CABG: L-LAD/Dx, S-OM/dCFX;  c.Nuclear scan 8/12:  Inferior and inf-lat scar with minimal peri-infarct ischemia, EF 53%.; d.LHC 9/12:  LAD 90%, pD1 (small) 70%, prox large Dx 95%, L-LAD and Dx ok, CFX occluded, S-OM and CFX ok, RCA prox to prox/mid 60-70%.  Medical management     . Cardiac arrest - ventricular fibrillation 08/26/2009  . Carpal tunnel syndrome of right wrist   . Cellulitis 2012   spider bite  . Chronic systolic heart failure ([place])   . CKD (chronic kidney disease)   . Coma ([place])   . Diabetic coma with ketoacidosis ([place])    Occured in 1994 where he spent 30 days in a coma . He develpoed infection of his leg and developing pancreatitis during that  time   . DM2 (diabetes mellitus, type 2) ([place])   . Dysrhythmia   . Foot drop, bilateral 04/08/2013  . Headache(784.0)    sinus  . History of degenerative disc disease   . HLD (hyperlipidemia)   . HTN (hypertension)   . Hypercholesteremia   . Ischemic cardiomyopathy    echo 10/11:  inf-septal and apical HK, EF 35%, mod LAE  . Neuropathy   . Nocturnal leg cramps   . Nocturnal leg cramps 04/02/2017  . Obesity   . Pancreatitis 2008  . Periodic limb movement 04/02/2017  . 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 Presence  of permanent cardiac pacemaker   . Retinal detachment    Bilateral, laser surgery on the right  . RLS (restless legs syndrome) 04/22/2015  . Thyroid disease   . Ulnar neuropathy at elbow    bilateral    Patient Active Problem List   Diagnosis Date Noted  . Skin infection, bacterial 04/14/2017  . Periodic limb movement 04/02/2017  . Nocturnal leg cramps 04/02/2017  . Dyslipidemia associated with type 2 diabetes mellitus ([place]) 09/26/2016  . Hypertension associated with stage 3 chronic kidney disease due to type 2 diabetes mellitus ([place]) 09/26/2016  . Uncontrolled type 2 diabetes mellitus with diabetic neuropathy, with long-term current use of insulin ([place]) 09/26/2016  . Uncontrolled type 2 diabetes mellitus with severe nonproliferative retinopathy and macular edema, without long-term current use of insulin ([place]) 09/26/2016  . Uncontrolled type 2 diabetes mellitus with stage 4 chronic kidney disease, with long-term current use of insulin ([place]) 09/26/2016  . Combined forms of age-related cataract of left eye 04/27/2016  . Combined forms of age-related cataract of right eye 04/13/2016  . CKD stage 4 due to type 2 diabetes mellitus ([place]) 04/11/2016  . RLS (restless legs syndrome) 04/22/2015  . Tubular adenoma of colon 01/07/2015  . Bilateral elbow joint pain 09/30/2013  . Left carpal tunnel syndrome 09/30/2013  .  ED (erectile dysfunction) 09/30/2013  . Panic disorder with agoraphobia 07/29/2013  . Allergic rhinitis 05/29/2013  . Back pain 05/13/2013  . DDD (degenerative disc disease) 05/13/2013  . Abnormality of gait 04/08/2013  . Diabetic polyneuropathy ([place]) 04/08/2013  . Foot drop, bilateral 04/08/2013  . Hip pain, right 03/23/2013  . Disequilibrium 03/23/2013  . Poor vision 11/28/2012  . OSA (obstructive sleep apnea) 02/02/2012  . Carotid bruit 11/01/2011  . Numbness and tingling in hands 11/01/2011  . Insomnia 07/28/2011  . Anxiety 07/23/2011  . Stress 07/23/2011  . Chronic  systolic heart failure ([place])   . Automatic implantable cardioverter-defibrillator in situ 03/10/2010  . HYPERTENSION, BENIGN 12/28/2009  . VENTRICULAR TACHYCARDIA 12/28/2009  . Coronary atherosclerosis of native coronary artery 09/20/2009  . FATIGUE 09/14/2009  . Diabetes mellitus, insulin dependent (IDDM), uncontrolled ([place]) 05/04/2008  . Depression with anxiety 05/04/2008  . Hyperlipidemia 07/10/2007  . Morbid obesity ([place]) 07/10/2007  . NICOTINE ADDICTION 07/10/2007    Past Surgical History:  Procedure Laterality Date  . APPENDECTOMY  mid 1990  . CARDIAC DEFIBRILLATOR PLACEMENT    . CARDIAC SURGERY    . CARPAL TUNNEL WITH CUBITAL TUNNEL  02/22/2012   Procedure: CARPAL TUNNEL WITH CUBITAL TUNNEL;  Surgeon: Roseanne Kaufman, MD;  Location: [facility];  Service: Orthopedics;  Laterality: Right;  Right Carpal Tunnel Release/Right Cubital Tunnel Release and Transposition if Necessary with Flexor Pronator Release  . COLONOSCOPY WITH PROPOFOL N/A 11/17/2014   Procedure: COLONOSCOPY WITH PROPOFOL;  Surgeon: Manus Gunning, MD;  Location: [facility];  Service: Gastroenterology;  Laterality: N/A;  . COLONOSCOPY WITH PROPOFOL N/A 01/05/2015   Procedure: COLONOSCOPY WITH PROPOFOL;  Surgeon: Manus Gunning, MD;  Location: [facility];  Service: Gastroenterology;  Laterality: N/A;  . CORONARY ARTERY BYPASS GRAFT    . coronary artery bypass grafting x4  june 29,2011   x 4  . PACEMAKER PLACEMENT    . RETINAL DETACHMENT SURGERY Bilateral   . RETINAL DETACHMENT SURGERY Bilateral    3 months ago  . right sided abdominal cyst removal    . TONSILLECTOMY  1998  . ULNAR NERVE TRANSPOSITION Right        Home Medications    Prior to Admission medications   Medication Sig Start Date End Date Taking? Authorizing Provider  aspirin 325 MG tablet Take 325 mg by mouth daily.      [provider]  baclofen (LIORESAL) 20 MG tablet Take 1 tablet (20 mg total) by mouth 3 (three) times  daily. 04/02/17   Kathrynn Ducking, MD  busPIRone (BUSPAR) 10 MG tablet Take 1 tablet (10 mg total) by mouth 2 (two) times daily. 04/11/17   Cloria Spring, MD  Chromium Picolinate 200 MCG CAPS Take 200 mcg by mouth daily.     [provider]  doxycycline (VIBRAMYCIN) 100 MG capsule Take 1 capsule (100 mg total) by mouth 2 (two) times daily. Take with food. 06/23/17   Kandra Nicolas, MD  ezetimibe (ZETIA) 10 MG tablet Take 10 mg by mouth daily.    [provider]  FLUoxetine (PROZAC) 20 MG capsule Take 1 capsule (20 mg total) by mouth daily. 04/11/17 04/11/18  Cloria Spring, MD  fluticasone (FLONASE) 50 MCG/ACT nasal spray Place 2 sprays into both nostrils daily. 04/11/17   [name], MD  furosemide (LASIX) 40 MG tablet Take 1 tablet (40 mg total) by mouth daily. 12/06/16   Evans Lance, MD  Insulin Glargine-Lixisenatide 100-33 UNT-MCG/ML SOPN Inject  40 Units into the skin daily. [facility] 02/18/16   [provider]  insulin NPH-regular Human (NOVOLIN 70/30) (70-30) 100 UNIT/ML injection Inject into the skin. 09/26/16   [provider]  Insulin Syringe-Needle U-100 (INSULIN SYRINGE 1CC/31GX5/16") 31G X 5/16" 1 ML MISC by Does not apply route. 09/26/16   [provider]  methylcellulose (ARTIFICIAL TEARS) 1 % ophthalmic solution Place 1 drop into both eyes 2 (two) times daily as needed (dry eyes).    [provider]  metoprolol succinate (TOPROL-XL) 50 MG 24 hr tablet Take 1 tablet (50 mg total) by mouth daily. Please make overdue appt with Dr. Lovena Le before any more refills. 1st attempt 06/04/17   Evans Lance, MD  Multiple Vitamin (MULTIVITAMIN) tablet Take 1 tablet by mouth daily.      [provider]  nitroGLYCERIN (NITROSTAT) 0.4 MG SL tablet Place 1 tablet (0.4 mg total) under the tongue every 5 (five) minutes as needed for chest pain. 01/21/16 01/26/20  Evans Lance, MD  Omega-3 Fatty Acids (FISH OIL BURP-LESS PO) Take 1  tablet by mouth daily.      [provider]  pramipexole (MIRAPEX) 0.125 MG tablet Take 1 tablet (0.125 mg total) by mouth at bedtime. 04/02/17   Kathrynn Ducking, MD  Propylhexedrine [facility]) INHA Place 1 each into the nose 2 (two) times daily as needed (congestion).     [provider]  rosuvastatin (CRESTOR) 40 MG tablet Take 1 tablet (40 mg total) by mouth daily. 10/13/16   [name], MD    Family History Family History  Problem Relation Age of Onset  . Allergies Mother   . Diabetes Mother   . Hypertension Mother   . Hyperlipidemia Mother   . Dementia Mother   . Colon cancer Mother   . Lung cancer Mother   . Kidney cancer Mother   . Colon polyps Mother   . Stroke Father   . Allergies Father   . Dementia Father   . Alcohol abuse Father   . Colon polyps Father   . Diabetes Sister   . Heart disease Sister   . Anxiety disorder Sister   . Cancer Maternal Grandmother        unknown type  . Schizophrenia Paternal Uncle   . Schizophrenia Cousin     Social History Social History   Tobacco Use  . Smoking status: Former Smoker    Types: Cigars  . Smokeless tobacco: Current User    Types: Snuff  . Tobacco comment: Occasional cigar  Substance Use Topics  . Alcohol use: No    Alcohol/week: 0.0 oz    Comment: rarely  . Drug use: No     Allergies   Ace inhibitors; Cephalexin; and Beta adrenergic blockers   Review of Systems Review of Systems  Constitutional: Positive for chills, diaphoresis and fatigue. Negative for fever.  HENT: Negative.   Eyes: Negative.   Respiratory: Negative.   Cardiovascular: Negative.   Gastrointestinal: Negative.   Genitourinary: Negative.   Musculoskeletal: Positive for joint swelling. Negative for neck stiffness.  Skin: Positive for color change.  Neurological: Negative for headaches.     Physical Exam Triage Vital Signs ED Triage Vitals [06/25/17 1759]  Enc Vitals Group     BP (!) 147/78     Pulse  Rate 90     Resp      Temp 98.9 F (37.2 C)     Temp Source Oral     SpO2 99 %  Weight      Height      Head Circumference      Peak Flow      Pain Score 0     Pain Loc      Pain Edu?      Excl. in [place]?    No data found.  Updated Vital Signs BP (!) 147/78 (BP Location: Right Arm)   Pulse 90   Temp 98.9 F (37.2 C) (Oral)   SpO2 99%   Visual Acuity Right Eye Distance:   Left Eye Distance:   Bilateral Distance:    Right Eye Near:   Left Eye Near:    Bilateral Near:     Physical Exam  Constitutional: He appears well-developed and well-nourished. No distress.  HENT:  Head: Normocephalic.  Eyes: Pupils are equal, round, and reactive to light.  Neck: Neck supple.  Cardiovascular: Normal rate.  Pulmonary/Chest: Effort normal.  Musculoskeletal:       Right wrist: He exhibits decreased range of motion, tenderness and swelling.       Arms: Right wrist/forearm increasingly indurated, with fluctuance over proximal wrist.  The area is warm and tender to palpation.  Neurological: He is alert.  Skin: Skin is warm and dry.  Nursing note and vitals reviewed.    UC Treatments / Results  Labs (all labs ordered are listed, but only abnormal results are displayed) Labs Reviewed  POCT CBC W AUTO DIFF ([facility])  WBC 15.9; LY 8.7; MO 4.7; GR 86.6; Hgb 12.1; Platelets 141     EKG None Radiology No results found.  Procedures Procedures (including critical care time)  Medications Ordered in UC Medications - No data to display   Initial Impression / Assessment and Plan / UC Course  I have reviewed the triage vital signs and the nursing notes.  Pertinent labs & imaging results that were available during my care of the patient were reviewed by me and considered in my medical decision making (see chart for details).    Patient not improved with conservative treatment.  Persistent leukocytosis (WBC 15.9) Advised to proceed to [facility] ED for  evaluation/treatment.  Vital signs stable; safe to travel by private vehicle (father to drive).  Final Clinical Impressions(s) / UC Diagnoses   Final diagnoses:  Abscess of wrist  Cellulitis of forearm, right    ED Discharge Orders    None           Kandra Nicolas, MD 06/25/17 1924

## 2017-06-25 NOTE — ED Provider Notes (Signed)
Patient placed in Quick Look pathway, seen and evaluated   Chief Complaint: Cellulitis of the right forearm  HPI:   Diabetic male presents to the emergency department from [facility] urgent care for cellulitis of the right forearm with failed outpatient antibiotic use of doxycycline.  He denies significant pain, fevers or chills.  ROS: Arm pain and swelling (one)  Physical Exam:   Gen: No distress  Neuro: Awake and Alert  Skin: Warm    Focused Exam: [place], well-circumscribed, erythema of the right forearm.  In the flexor crease of the right wrist there is an area of small blistering with purulence.  Tender to palpation.   Initiation of care has begun. The patient has been counseled on the process, plan, and necessity for staying for the completion/evaluation, and the remainder of the medical screening examination    Margarita Mail, PA-C 06/25/17 2106    Deno Etienne, DO 06/25/17 2327

## 2017-06-25 NOTE — Discharge Instructions (Addendum)
Proceed to [facility] Emergency Department for further evaluation/treatment.

## 2017-06-26 ENCOUNTER — Inpatient Hospital Stay (HOSPITAL_COMMUNITY): Payer: Medicare Other

## 2017-06-26 ENCOUNTER — Inpatient Hospital Stay (HOSPITAL_COMMUNITY): Payer: Medicare Other | Admitting: Certified Registered"

## 2017-06-26 ENCOUNTER — Other Ambulatory Visit: Payer: Self-pay

## 2017-06-26 ENCOUNTER — Encounter (HOSPITAL_COMMUNITY)
Admission: EM | Disposition: A | Discharge status: Home / Self Care | Payer: Self-pay | Source: Home / Self Care | Attending: Internal Medicine

## 2017-06-26 ENCOUNTER — Encounter (HOSPITAL_COMMUNITY): Payer: Self-pay | Admitting: General Practice

## 2017-06-26 DIAGNOSIS — D62 Acute posthemorrhagic anemia: Secondary | ICD-10-CM | POA: Diagnosis not present

## 2017-06-26 DIAGNOSIS — E119 Type 2 diabetes mellitus without complications: Secondary | ICD-10-CM | POA: Diagnosis not present

## 2017-06-26 DIAGNOSIS — I2510 Atherosclerotic heart disease of native coronary artery without angina pectoris: Secondary | ICD-10-CM | POA: Diagnosis not present

## 2017-06-26 DIAGNOSIS — I5022 Chronic systolic (congestive) heart failure: Secondary | ICD-10-CM

## 2017-06-26 DIAGNOSIS — M60021 Infective myositis, right upper arm: Secondary | ICD-10-CM | POA: Diagnosis not present

## 2017-06-26 DIAGNOSIS — M65131 Other infective (teno)synovitis, right wrist: Secondary | ICD-10-CM | POA: Diagnosis not present

## 2017-06-26 DIAGNOSIS — N184 Chronic kidney disease, stage 4 (severe): Secondary | ICD-10-CM

## 2017-06-26 DIAGNOSIS — Z6837 Body mass index (BMI) 37.0-37.9, adult: Secondary | ICD-10-CM | POA: Diagnosis not present

## 2017-06-26 DIAGNOSIS — E7800 Pure hypercholesterolemia, unspecified: Secondary | ICD-10-CM | POA: Diagnosis present

## 2017-06-26 DIAGNOSIS — E785 Hyperlipidemia, unspecified: Secondary | ICD-10-CM | POA: Diagnosis not present

## 2017-06-26 DIAGNOSIS — I10 Essential (primary) hypertension: Secondary | ICD-10-CM

## 2017-06-26 DIAGNOSIS — Z95810 Presence of automatic (implantable) cardiac defibrillator: Secondary | ICD-10-CM | POA: Diagnosis not present

## 2017-06-26 DIAGNOSIS — E1122 Type 2 diabetes mellitus with diabetic chronic kidney disease: Secondary | ICD-10-CM | POA: Diagnosis not present

## 2017-06-26 DIAGNOSIS — N19 Unspecified kidney failure: Secondary | ICD-10-CM | POA: Diagnosis not present

## 2017-06-26 DIAGNOSIS — F329 Major depressive disorder, single episode, unspecified: Secondary | ICD-10-CM | POA: Diagnosis present

## 2017-06-26 DIAGNOSIS — F419 Anxiety disorder, unspecified: Secondary | ICD-10-CM | POA: Diagnosis present

## 2017-06-26 DIAGNOSIS — G5621 Lesion of ulnar nerve, right upper limb: Secondary | ICD-10-CM | POA: Diagnosis not present

## 2017-06-26 DIAGNOSIS — M79641 Pain in right hand: Secondary | ICD-10-CM | POA: Diagnosis not present

## 2017-06-26 DIAGNOSIS — I129 Hypertensive chronic kidney disease with stage 1 through stage 4 chronic kidney disease, or unspecified chronic kidney disease: Secondary | ICD-10-CM | POA: Diagnosis not present

## 2017-06-26 DIAGNOSIS — N179 Acute kidney failure, unspecified: Secondary | ICD-10-CM | POA: Diagnosis present

## 2017-06-26 DIAGNOSIS — Z951 Presence of aortocoronary bypass graft: Secondary | ICD-10-CM | POA: Diagnosis not present

## 2017-06-26 DIAGNOSIS — L03113 Cellulitis of right upper limb: Secondary | ICD-10-CM | POA: Diagnosis present

## 2017-06-26 DIAGNOSIS — E1142 Type 2 diabetes mellitus with diabetic polyneuropathy: Secondary | ICD-10-CM | POA: Diagnosis present

## 2017-06-26 DIAGNOSIS — L02413 Cutaneous abscess of right upper limb: Secondary | ICD-10-CM | POA: Diagnosis not present

## 2017-06-26 DIAGNOSIS — D631 Anemia in chronic kidney disease: Secondary | ICD-10-CM | POA: Diagnosis not present

## 2017-06-26 DIAGNOSIS — M726 Necrotizing fasciitis: Secondary | ICD-10-CM | POA: Diagnosis not present

## 2017-06-26 DIAGNOSIS — I255 Ischemic cardiomyopathy: Secondary | ICD-10-CM | POA: Diagnosis present

## 2017-06-26 DIAGNOSIS — E1169 Type 2 diabetes mellitus with other specified complication: Secondary | ICD-10-CM | POA: Diagnosis not present

## 2017-06-26 DIAGNOSIS — M65141 Other infective (teno)synovitis, right hand: Secondary | ICD-10-CM | POA: Diagnosis not present

## 2017-06-26 DIAGNOSIS — Z7982 Long term (current) use of aspirin: Secondary | ICD-10-CM | POA: Diagnosis not present

## 2017-06-26 DIAGNOSIS — S61501A Unspecified open wound of right wrist, initial encounter: Secondary | ICD-10-CM | POA: Diagnosis not present

## 2017-06-26 DIAGNOSIS — L0390 Cellulitis, unspecified: Secondary | ICD-10-CM | POA: Diagnosis present

## 2017-06-26 DIAGNOSIS — I110 Hypertensive heart disease with heart failure: Secondary | ICD-10-CM | POA: Diagnosis not present

## 2017-06-26 DIAGNOSIS — E872 Acidosis: Secondary | ICD-10-CM | POA: Diagnosis not present

## 2017-06-26 DIAGNOSIS — Z8674 Personal history of sudden cardiac arrest: Secondary | ICD-10-CM | POA: Diagnosis not present

## 2017-06-26 DIAGNOSIS — E669 Obesity, unspecified: Secondary | ICD-10-CM | POA: Diagnosis present

## 2017-06-26 DIAGNOSIS — Z79899 Other long term (current) drug therapy: Secondary | ICD-10-CM | POA: Diagnosis not present

## 2017-06-26 DIAGNOSIS — I130 Hypertensive heart and chronic kidney disease with heart failure and stage 1 through stage 4 chronic kidney disease, or unspecified chronic kidney disease: Secondary | ICD-10-CM | POA: Diagnosis not present

## 2017-06-26 DIAGNOSIS — Z794 Long term (current) use of insulin: Secondary | ICD-10-CM | POA: Diagnosis not present

## 2017-06-26 DIAGNOSIS — M60041 Infective myositis, right hand: Secondary | ICD-10-CM | POA: Diagnosis not present

## 2017-06-26 HISTORY — PX: I & D EXTREMITY: SHX5045

## 2017-06-26 LAB — CUP PACEART REMOTE DEVICE CHECK
Battery Remaining Longevity: 54 mo
Brady Statistic RA Percent Paced: 1 %
Brady Statistic RV Percent Paced: 0 %
Date Time Interrogation Session: 20190404090200
HighPow Impedance: 50 Ohm
Implantable Lead Implant Date: 20110706
Implantable Lead Location: 753859
Implantable Lead Model: 5076
Implantable Lead Serial Number: 310740
Lead Channel Impedance Value: 431 Ohm
Lead Channel Impedance Value: 446 Ohm
Lead Channel Pacing Threshold Amplitude: 0.9 V
Lead Channel Setting Sensing Sensitivity: 0.5 mV
MDC IDC LEAD IMPLANT DT: 20110706
MDC IDC LEAD LOCATION: 753860
MDC IDC MSMT BATTERY REMAINING PERCENTAGE: 63 %
MDC IDC MSMT LEADCHNL RA PACING THRESHOLD AMPLITUDE: 0.5 V
MDC IDC MSMT LEADCHNL RA PACING THRESHOLD PULSEWIDTH: 0.4 ms
MDC IDC MSMT LEADCHNL RV PACING THRESHOLD PULSEWIDTH: 0.4 ms
MDC IDC PG IMPLANT DT: 20110706
MDC IDC SET LEADCHNL RA PACING AMPLITUDE: 2 V
MDC IDC SET LEADCHNL RV PACING AMPLITUDE: 2.4 V
MDC IDC SET LEADCHNL RV PACING PULSEWIDTH: 0.4 ms
Pulse Gen Serial Number: 168307

## 2017-06-26 LAB — GLUCOSE, CAPILLARY
GLUCOSE-CAPILLARY: 262 mg/dL — AB (ref 65–99)
GLUCOSE-CAPILLARY: 125 mg/dL — AB (ref 65–99)
GLUCOSE-CAPILLARY: 132 mg/dL — AB (ref 65–99)
Glucose-Capillary: 139 mg/dL — ABNORMAL HIGH (ref 65–99)
Glucose-Capillary: 188 mg/dL — ABNORMAL HIGH (ref 65–99)

## 2017-06-26 LAB — CBC
HEMATOCRIT: 31.3 % — AB (ref 39.0–52.0)
HEMOGLOBIN: 10.8 g/dL — AB (ref 13.0–17.0)
MCH: 28.5 pg (ref 26.0–34.0)
MCHC: 34.5 g/dL (ref 30.0–36.0)
MCV: 82.6 fL (ref 78.0–100.0)
Platelets: 126 10*3/uL — ABNORMAL LOW (ref 150–400)
RBC: 3.79 MIL/uL — AB (ref 4.22–5.81)
RDW: 14.9 % (ref 11.5–15.5)
WBC: 14.3 10*3/uL — ABNORMAL HIGH (ref 4.0–10.5)

## 2017-06-26 LAB — CREATININE, SERUM
CREATININE: 4.53 mg/dL — AB (ref 0.61–1.24)
GFR calc Af Amer: 16 mL/min — ABNORMAL LOW (ref >60)
GFR calc non Af Amer: 14 mL/min — ABNORMAL LOW (ref >60)

## 2017-06-26 SURGERY — IRRIGATION AND DEBRIDEMENT EXTREMITY
Anesthesia: General | Site: Arm Lower | Laterality: Right

## 2017-06-26 MED ORDER — SODIUM CHLORIDE 0.9 % IJ SOLN
INTRAMUSCULAR | Status: AC
  Filled 2017-06-26: qty 10

## 2017-06-26 MED ORDER — VANCOMYCIN HCL 10 G IV SOLR
1500.0000 mg | INTRAVENOUS | Status: DC
  Administered 2017-06-28: 1500 mg via INTRAVENOUS
  Filled 2017-06-26: qty 1500

## 2017-06-26 MED ORDER — SODIUM CHLORIDE 0.9 % IR SOLN
Status: DC | PRN
  Administered 2017-06-26: 3000 mL

## 2017-06-26 MED ORDER — SUFENTANIL CITRATE 50 MCG/ML IV SOLN
INTRAVENOUS | Status: DC | PRN
  Administered 2017-06-26: 10 ug via INTRAVENOUS

## 2017-06-26 MED ORDER — SODIUM CHLORIDE 0.9 % IV SOLN: INTRAVENOUS | Status: DC

## 2017-06-26 MED ORDER — BUSPIRONE HCL 10 MG PO TABS
10.0000 mg | ORAL_TABLET | Freq: Two times a day (BID) | ORAL | Status: DC
  Administered 2017-06-26 – 2017-07-03 (×13): 10 mg via ORAL
  Filled 2017-06-26 (×2): qty 2
  Filled 2017-06-26 (×2): qty 1
  Filled 2017-06-26: qty 2
  Filled 2017-06-26: qty 1
  Filled 2017-06-26 (×2): qty 2
  Filled 2017-06-26 (×5): qty 1

## 2017-06-26 MED ORDER — INSULIN ASPART 100 UNIT/ML ~~LOC~~ SOLN
0.0000 [IU] | Freq: Three times a day (TID) | SUBCUTANEOUS | Status: DC
  Administered 2017-06-26 – 2017-06-27 (×2): 8 [IU] via SUBCUTANEOUS
  Administered 2017-06-27: 5 [IU] via SUBCUTANEOUS
  Administered 2017-06-27 – 2017-06-29 (×4): 3 [IU] via SUBCUTANEOUS
  Administered 2017-06-29: 5 [IU] via SUBCUTANEOUS
  Administered 2017-06-29: 11 [IU] via SUBCUTANEOUS
  Administered 2017-07-01 (×3): 3 [IU] via SUBCUTANEOUS
  Administered 2017-07-02: 2 [IU] via SUBCUTANEOUS
  Administered 2017-07-02 (×2): 3 [IU] via SUBCUTANEOUS
  Administered 2017-07-03: 2 [IU] via SUBCUTANEOUS

## 2017-06-26 MED ORDER — ONDANSETRON HCL 4 MG PO TABS: 4.0000 mg | ORAL_TABLET | Freq: Four times a day (QID) | ORAL | Status: DC | PRN

## 2017-06-26 MED ORDER — PIPERACILLIN-TAZOBACTAM IN DEX 2-0.25 GM/50ML IV SOLN
2.2500 g | Freq: Four times a day (QID) | INTRAVENOUS | Status: DC
  Administered 2017-06-26 – 2017-06-28 (×8): 2.25 g via INTRAVENOUS
  Filled 2017-06-26 (×13): qty 50

## 2017-06-26 MED ORDER — PROPOFOL 10 MG/ML IV BOLUS
INTRAVENOUS | Status: AC
  Filled 2017-06-26: qty 20

## 2017-06-26 MED ORDER — ONDANSETRON HCL 4 MG/2ML IJ SOLN
INTRAMUSCULAR | Status: DC | PRN
  Administered 2017-06-26: 4 mg via INTRAVENOUS

## 2017-06-26 MED ORDER — MEPERIDINE HCL 50 MG/ML IJ SOLN: 6.2500 mg | INTRAMUSCULAR | Status: DC | PRN

## 2017-06-26 MED ORDER — BACITRACIN ZINC 500 UNIT/GM EX OINT
TOPICAL_OINTMENT | CUTANEOUS | Status: AC
  Filled 2017-06-26: qty 56.7

## 2017-06-26 MED ORDER — LIDOCAINE 2% (20 MG/ML) 5 ML SYRINGE
INTRAMUSCULAR | Status: DC | PRN
  Administered 2017-06-26: 100 mg via INTRAVENOUS

## 2017-06-26 MED ORDER — SUCCINYLCHOLINE CHLORIDE 200 MG/10ML IV SOSY
PREFILLED_SYRINGE | INTRAVENOUS | Status: AC
  Filled 2017-06-26: qty 10

## 2017-06-26 MED ORDER — MIDAZOLAM HCL 2 MG/2ML IJ SOLN: 0.5000 mg | Freq: Once | INTRAMUSCULAR | Status: DC | PRN

## 2017-06-26 MED ORDER — HYDROMORPHONE HCL 2 MG/ML IJ SOLN
0.5000 mg | Freq: Once | INTRAMUSCULAR | Status: AC
  Administered 2017-06-26: 0.5 mg via INTRAVENOUS
  Filled 2017-06-26: qty 1

## 2017-06-26 MED ORDER — PIPERACILLIN-TAZOBACTAM 3.375 G IVPB
3.3750 g | Freq: Three times a day (TID) | INTRAVENOUS | Status: DC
  Administered 2017-06-26: 3.375 g via INTRAVENOUS
  Filled 2017-06-26 (×3): qty 50

## 2017-06-26 MED ORDER — ONDANSETRON HCL 4 MG/2ML IJ SOLN
INTRAMUSCULAR | Status: AC
  Filled 2017-06-26: qty 2

## 2017-06-26 MED ORDER — HEPARIN SODIUM (PORCINE) 5000 UNIT/ML IJ SOLN
5000.0000 [IU] | Freq: Three times a day (TID) | INTRAMUSCULAR | Status: DC
  Administered 2017-06-27: 5000 [IU] via SUBCUTANEOUS
  Filled 2017-06-26: qty 1

## 2017-06-26 MED ORDER — SUFENTANIL CITRATE 50 MCG/ML IV SOLN
INTRAVENOUS | Status: AC
  Filled 2017-06-26: qty 1

## 2017-06-26 MED ORDER — FLUTICASONE PROPIONATE 50 MCG/ACT NA SUSP
2.0000 | Freq: Every day | NASAL | Status: DC
  Administered 2017-06-26 – 2017-07-03 (×8): 2 via NASAL
  Filled 2017-06-26: qty 16

## 2017-06-26 MED ORDER — METOPROLOL SUCCINATE ER 50 MG PO TB24
50.0000 mg | ORAL_TABLET | Freq: Every day | ORAL | Status: DC
  Administered 2017-06-26 – 2017-07-03 (×8): 50 mg via ORAL
  Filled 2017-06-26 (×8): qty 1

## 2017-06-26 MED ORDER — EPHEDRINE 5 MG/ML INJ
INTRAVENOUS | Status: AC
  Filled 2017-06-26: qty 10

## 2017-06-26 MED ORDER — INSULIN ASPART PROT & ASPART (70-30 MIX) 100 UNIT/ML ~~LOC~~ SUSP
20.0000 [IU] | Freq: Two times a day (BID) | SUBCUTANEOUS | Status: DC
  Administered 2017-06-26 – 2017-07-02 (×9): 20 [IU] via SUBCUTANEOUS
  Filled 2017-06-26: qty 10

## 2017-06-26 MED ORDER — BACLOFEN 10 MG PO TABS
20.0000 mg | ORAL_TABLET | Freq: Three times a day (TID) | ORAL | Status: DC
  Administered 2017-06-26 – 2017-06-27 (×4): 20 mg via ORAL
  Filled 2017-06-26 (×4): qty 2

## 2017-06-26 MED ORDER — PIPERACILLIN-TAZOBACTAM 3.375 G IVPB 30 MIN: 3.3750 g | Freq: Once | INTRAVENOUS | Status: DC

## 2017-06-26 MED ORDER — PROMETHAZINE HCL 25 MG/ML IJ SOLN: 6.2500 mg | INTRAMUSCULAR | Status: DC | PRN

## 2017-06-26 MED ORDER — EPHEDRINE SULFATE 50 MG/ML IJ SOLN
INTRAMUSCULAR | Status: DC | PRN
  Administered 2017-06-26 (×3): 10 mg via INTRAVENOUS

## 2017-06-26 MED ORDER — SODIUM CHLORIDE 0.9 % IV SOLN
INTRAVENOUS | Status: DC
  Administered 2017-06-26: 18:00:00 via INTRAVENOUS

## 2017-06-26 MED ORDER — POLYVINYL ALCOHOL 1.4 % OP SOLN
1.0000 [drp] | Freq: Two times a day (BID) | OPHTHALMIC | Status: DC | PRN
  Administered 2017-06-26 – 2017-06-30 (×3): 1 [drp] via OPHTHALMIC
  Filled 2017-06-26: qty 15

## 2017-06-26 MED ORDER — ONDANSETRON HCL 4 MG/2ML IJ SOLN: 4.0000 mg | Freq: Four times a day (QID) | INTRAMUSCULAR | Status: DC | PRN

## 2017-06-26 MED ORDER — ROSUVASTATIN CALCIUM 10 MG PO TABS
40.0000 mg | ORAL_TABLET | Freq: Every day | ORAL | Status: DC
  Administered 2017-06-26 – 2017-07-03 (×8): 40 mg via ORAL
  Filled 2017-06-26 (×8): qty 4

## 2017-06-26 MED ORDER — ACETAMINOPHEN 650 MG RE SUPP: 650.0000 mg | Freq: Four times a day (QID) | RECTAL | Status: DC | PRN

## 2017-06-26 MED ORDER — MIDAZOLAM HCL 2 MG/2ML IJ SOLN
INTRAMUSCULAR | Status: AC
  Filled 2017-06-26: qty 2

## 2017-06-26 MED ORDER — LIDOCAINE 2% (20 MG/ML) 5 ML SYRINGE
INTRAMUSCULAR | Status: AC
  Filled 2017-06-26: qty 5

## 2017-06-26 MED ORDER — SUCCINYLCHOLINE CHLORIDE 200 MG/10ML IV SOSY
PREFILLED_SYRINGE | INTRAVENOUS | Status: DC | PRN
Start: 2017-06-26 — End: 2017-06-26
  Administered 2017-06-26: 200 mg via INTRAVENOUS

## 2017-06-26 MED ORDER — BACITRACIN-NEOMYCIN-POLYMYXIN 400-5-5000 EX OINT
TOPICAL_OINTMENT | CUTANEOUS | Status: DC | PRN
  Administered 2017-06-26: 1 via TOPICAL

## 2017-06-26 MED ORDER — FENTANYL CITRATE (PF) 100 MCG/2ML IJ SOLN: 25.0000 ug | INTRAMUSCULAR | Status: DC | PRN

## 2017-06-26 MED ORDER — VANCOMYCIN HCL IN DEXTROSE 1-5 GM/200ML-% IV SOLN: 1000.0000 mg | Freq: Once | INTRAVENOUS | Status: DC

## 2017-06-26 MED ORDER — PROPOFOL 10 MG/ML IV BOLUS
INTRAVENOUS | Status: DC | PRN
  Administered 2017-06-26: 100 mg via INTRAVENOUS

## 2017-06-26 MED ORDER — BUPIVACAINE HCL (PF) 0.25 % IJ SOLN
INTRAMUSCULAR | Status: AC
  Filled 2017-06-26: qty 30

## 2017-06-26 MED ORDER — BACITRACIN-NEOMYCIN-POLYMYXIN 400-5-5000 EX OINT
TOPICAL_OINTMENT | CUTANEOUS | Status: AC
Start: 2017-06-26 — End: ?
  Filled 2017-06-26: qty 2

## 2017-06-26 MED ORDER — PIPERACILLIN-TAZOBACTAM 3.375 G IVPB 30 MIN
3.3750 g | Freq: Once | INTRAVENOUS | Status: AC
  Administered 2017-06-26: 3.375 g via INTRAVENOUS
  Filled 2017-06-26: qty 50

## 2017-06-26 MED ORDER — SODIUM CHLORIDE 0.9 % IR SOLN
Status: DC | PRN
  Administered 2017-06-26: 1000 mL

## 2017-06-26 MED ORDER — SODIUM CHLORIDE 0.9 % IV BOLUS
1000.0000 mL | Freq: Once | INTRAVENOUS | Status: AC
  Administered 2017-06-26: 1000 mL via INTRAVENOUS

## 2017-06-26 MED ORDER — SODIUM CHLORIDE 0.9 % IV SOLN
INTRAVENOUS | Status: DC | PRN
  Administered 2017-06-26: 19:00:00 via INTRAVENOUS

## 2017-06-26 MED ORDER — ACETAMINOPHEN 325 MG PO TABS
650.0000 mg | ORAL_TABLET | Freq: Four times a day (QID) | ORAL | Status: DC | PRN
  Administered 2017-06-27 – 2017-07-01 (×6): 650 mg via ORAL
  Filled 2017-06-26 (×6): qty 2

## 2017-06-26 MED ORDER — FLUOXETINE HCL 20 MG PO CAPS
20.0000 mg | ORAL_CAPSULE | Freq: Every day | ORAL | Status: DC
  Administered 2017-06-26 – 2017-07-03 (×8): 20 mg via ORAL
  Filled 2017-06-26 (×9): qty 1

## 2017-06-26 MED ORDER — MIDAZOLAM HCL 5 MG/5ML IJ SOLN
INTRAMUSCULAR | Status: DC | PRN
  Administered 2017-06-26: 2 mg via INTRAVENOUS

## 2017-06-26 MED ORDER — OXYMETAZOLINE HCL 0.05 % NA SOLN
1.0000 | Freq: Two times a day (BID) | NASAL | Status: DC
  Administered 2017-06-26 – 2017-07-03 (×13): 1 via NASAL
  Filled 2017-06-26: qty 15

## 2017-06-26 MED ORDER — INSULIN ASPART 100 UNIT/ML ~~LOC~~ SOLN
0.0000 [IU] | Freq: Every day | SUBCUTANEOUS | Status: DC
  Administered 2017-06-29 – 2017-07-01 (×2): 2 [IU] via SUBCUTANEOUS

## 2017-06-26 MED ORDER — ASPIRIN EC 325 MG PO TBEC
325.0000 mg | DELAYED_RELEASE_TABLET | Freq: Every day | ORAL | Status: DC
  Administered 2017-06-26 – 2017-07-03 (×8): 325 mg via ORAL
  Filled 2017-06-26 (×8): qty 1

## 2017-06-26 MED ORDER — VANCOMYCIN HCL 10 G IV SOLR
2000.0000 mg | Freq: Once | INTRAVENOUS | Status: AC
  Administered 2017-06-26: 2000 mg via INTRAVENOUS
  Filled 2017-06-26: qty 2000

## 2017-06-26 MED ORDER — INSULIN GLARGINE-LIXISENATIDE 100-33 UNT-MCG/ML ~~LOC~~ SOPN: 40.0000 [IU] | PEN_INJECTOR | Freq: Every day | SUBCUTANEOUS | Status: DC

## 2017-06-26 SURGICAL SUPPLY — 43 items
BANDAGE ACE 4X5 VEL STRL LF (GAUZE/BANDAGES/DRESSINGS) ×7 IMPLANT
BNDG CONFORM 2 STRL LF (GAUZE/BANDAGES/DRESSINGS) IMPLANT
BNDG GAUZE ELAST 4 BULKY (GAUZE/BANDAGES/DRESSINGS) ×9 IMPLANT
CORDS BIPOLAR (ELECTRODE) ×3 IMPLANT
COVER SURGICAL LIGHT HANDLE (MISCELLANEOUS) ×3 IMPLANT
CUFF TOURNIQUET SINGLE 18IN (TOURNIQUET CUFF) ×3 IMPLANT
CUFF TOURNIQUET SINGLE 24IN (TOURNIQUET CUFF) IMPLANT
DRAIN PENROSE 18X1/4 LTX STRL (WOUND CARE) ×2 IMPLANT
DRSG ADAPTIC 3X8 NADH LF (GAUZE/BANDAGES/DRESSINGS) ×3 IMPLANT
DRSG MEPITEL 4X7.2 (GAUZE/BANDAGES/DRESSINGS) ×2 IMPLANT
DRSG PAD ABDOMINAL 8X10 ST (GAUZE/BANDAGES/DRESSINGS) ×4 IMPLANT
GAUZE SPONGE 4X4 12PLY STRL (GAUZE/BANDAGES/DRESSINGS) ×3 IMPLANT
GAUZE XEROFORM 1X8 LF (GAUZE/BANDAGES/DRESSINGS) ×3 IMPLANT
GLOVE BIOGEL M 8.0 STRL (GLOVE) ×1 IMPLANT
GLOVE SS BIOGEL STRL SZ 8 (GLOVE) ×1 IMPLANT
GLOVE SUPERSENSE BIOGEL SZ 8 (GLOVE) ×2
GOWN STRL REUS W/ TWL LRG LVL3 (GOWN DISPOSABLE) ×1 IMPLANT
GOWN STRL REUS W/ TWL XL LVL3 (GOWN DISPOSABLE) ×2 IMPLANT
GOWN STRL REUS W/TWL LRG LVL3 (GOWN DISPOSABLE)
GOWN STRL REUS W/TWL XL LVL3 (GOWN DISPOSABLE) ×6
KIT BASIN OR (CUSTOM PROCEDURE TRAY) ×3 IMPLANT
KIT TURNOVER KIT B (KITS) ×3 IMPLANT
MANIFOLD NEPTUNE II (INSTRUMENTS) ×3 IMPLANT
NDL HYPO 25GX1X1/2 BEV (NEEDLE) IMPLANT
NEEDLE HYPO 25GX1X1/2 BEV (NEEDLE) ×3 IMPLANT
NS IRRIG 1000ML POUR BTL (IV SOLUTION) ×3 IMPLANT
PACK ORTHO EXTREMITY (CUSTOM PROCEDURE TRAY) ×3 IMPLANT
PAD ARMBOARD 7.5X6 YLW CONV (MISCELLANEOUS) ×3 IMPLANT
PAD CAST 4YDX4 CTTN HI CHSV (CAST SUPPLIES) ×1 IMPLANT
PADDING CAST COTTON 4X4 STRL (CAST SUPPLIES) ×15
SCRUB BETADINE 4OZ XXX (MISCELLANEOUS) ×3 IMPLANT
SOL PREP POV-IOD 4OZ 10% (MISCELLANEOUS) ×3 IMPLANT
SPLINT FIBERGLASS 4X30 (CAST SUPPLIES) ×2 IMPLANT
SPONGE LAP 4X18 X RAY DECT (DISPOSABLE) ×1 IMPLANT
SUT PROLENE 3 0 PS 2 (SUTURE) ×2 IMPLANT
SWAB CULTURE ESWAB REG 1ML (MISCELLANEOUS) ×2 IMPLANT
SYR CONTROL 10ML LL (SYRINGE) ×2 IMPLANT
TOWEL OR 17X24 6PK STRL BLUE (TOWEL DISPOSABLE) ×1 IMPLANT
TOWEL OR 17X26 10 PK STRL BLUE (TOWEL DISPOSABLE) ×3 IMPLANT
TUBE CONNECTING 12'X1/4 (SUCTIONS) ×1
TUBE CONNECTING 12X1/4 (SUCTIONS) ×2 IMPLANT
WATER STERILE IRR 1000ML POUR (IV SOLUTION) ×1 IMPLANT
YANKAUER SUCT BULB TIP NO VENT (SUCTIONS) ×3 IMPLANT

## 2017-06-26 NOTE — Progress Notes (Signed)
Dr. Linna Caprice notified that patient had chicken and dumplings finished at 1320 and water and coffee at 1500.

## 2017-06-26 NOTE — Anesthesia Procedure Notes (Signed)
Procedure Name: Intubation Date/Time: 06/26/2017 8:10 PM Performed by: Claris Che, CRNA Pre-anesthesia Checklist: Patient identified, Emergency Drugs available, Suction available, Patient being monitored and Timeout performed Patient Re-evaluated:Patient Re-evaluated prior to induction Oxygen Delivery Method: Circle system utilized Preoxygenation: Pre-oxygenation with 100% oxygen Induction Type: IV induction, Rapid sequence and Cricoid Pressure applied Laryngoscope Size: Mac and 4 Grade View: Grade II Tube type: Oral Tube size: 7.5 mm Number of attempts: 1 Airway Equipment and Method: Stylet Placement Confirmation: ETT inserted through vocal cords under direct vision,  positive ETCO2 and breath sounds checked- equal and bilateral Secured at: 24 cm Tube secured with: Tape Dental Injury: Teeth and Oropharynx as per pre-operative assessment

## 2017-06-26 NOTE — Anesthesia Preprocedure Evaluation (Addendum)
Anesthesia Evaluation  Patient identified by MRN, date of birth, ID band  Reviewed: Allergy & Precautions, NPO status , Patient's Chart, lab work & pertinent test results, reviewed documented beta blocker date and time   History of Anesthesia Complications Negative for: history of anesthetic complications  Airway Mallampati: II  TM Distance: >3 FB Neck ROM: Full    Dental  (+) Caps, Dental Advisory Given, Missing   Pulmonary sleep apnea (does not use CPAP) , former smoker,    breath sounds clear to auscultation       Cardiovascular hypertension, Pt. on medications and Pt. on home beta blockers (-) angina+ CAD, + CABG and +CHF (ischemic cardiomyopathy)  + dysrhythmias (h/o VF arrest) Ventricular Fibrillation + pacemaker [facility] Sci dual chamber) + Cardiac Defibrillator  Rhythm:Regular Rate:Normal  '11 ECHO: Inferior, septal, apical hypokinesis, EF 35%. '12 Myoview: low risk   Neuro/Psych  Headaches, Anxiety    GI/Hepatic negative GI ROS, Neg liver ROS,   Endo/Other  diabetes (glu 125), Insulin DependentMorbid obesity  Renal/GU Renal InsufficiencyRenal disease     Musculoskeletal   Abdominal (+) + obese,   Peds  Hematology  (+) Blood dyscrasia (Hb 10.8, plt 126), ,   Anesthesia Other Findings   Reproductive/Obstetrics                            Anesthesia Physical Anesthesia Plan  ASA: III and emergent  Anesthesia Plan: General   Post-op Pain Management:    Induction: Intravenous and Rapid sequence  PONV Risk Score and Plan: 3 and Ondansetron and Treatment may vary due to age or medical condition  Airway Management Planned: Oral ETT  Additional Equipment:   Intra-op Plan:   Post-operative Plan: Extubation in OR  Informed Consent: I have reviewed the patients History and Physical, chart, labs and discussed the procedure including the risks, benefits and alternatives for the proposed  anesthesia with the patient or authorized representative who has indicated his/her understanding and acceptance.   Dental advisory given  Plan Discussed with: CRNA and Surgeon  Anesthesia Plan Comments: (Plan routine monitors, GETA )        Anesthesia Quick Evaluation

## 2017-06-26 NOTE — ED Provider Notes (Signed)
[facility] Provider Note   CSN: [id number] Arrival date & time: 06/25/17  2004     History   Chief Complaint Chief Complaint  Patient presents with  . Abscess    HPI Matthew [name] is a 52 y.o. male.  The history is provided by the patient and medical records.     52 year old male with history of anxiety, stress, coronary artery disease, history of cellulitis, congestive heart failure, chronic kidney disease stage IV, diabetes, hypertension, hyperlipidemia, presenting to the ED with cellulitis of right forearm.  Patient was seen at urgent care on 06/23/2017 after he had a few days of redness and pain into his right wrist and forearm.  He was given shot of IM Rocephin and was discharged home on doxycycline.  He was seen in urgent care clinic again yesterday afternoon with significant worsening and was sent here for IV antibiotics and admission.  He denies any open wounds or cuts prior to onset of symptoms.  He denies history of IV drug use.  States his sugars have been a little higher than normal lately, sometimes in the 200 range.  Past Medical History:  Diagnosis Date  . Abnormality of gait 04/08/2013  . AICD (automatic cardioverter/defibrillator) present   . Anxiety    stress  . Anxiety and depression   . CAD (coronary artery disease)    a. OOH MI 6/11; presented with acute CHF; hosp course c/b VF arrest with VDRF, etc;   b s/p CABG: L-LAD/Dx, S-OM/dCFX;  c.Nuclear scan 8/12:  Inferior and inf-lat scar with minimal peri-infarct ischemia, EF 53%.; d.LHC 9/12:  LAD 90%, pD1 (small) 70%, prox large Dx 95%, L-LAD and Dx ok, CFX occluded, S-OM and CFX ok, RCA prox to prox/mid 60-70%.  Medical management     . Cardiac arrest - ventricular fibrillation 08/26/2009  . Carpal tunnel syndrome of right wrist   . Cellulitis 2012   spider bite  . Chronic systolic heart failure ([place])   . CKD (chronic kidney disease)   . Coma ([place])   . Diabetic coma with  ketoacidosis ([place])    Occured in 1994 where he spent 30 days in a coma . He develpoed infection of his leg and developing pancreatitis during that  time   . DM2 (diabetes mellitus, type 2) ([place])   . Dysrhythmia   . Foot drop, bilateral 04/08/2013  . Headache(784.0)    sinus  . History of degenerative disc disease   . HLD (hyperlipidemia)   . HTN (hypertension)   . Hypercholesteremia   . Ischemic cardiomyopathy    echo 10/11:  inf-septal and apical HK, EF 35%, mod LAE  . Neuropathy   . Nocturnal leg cramps   . Nocturnal leg cramps 04/02/2017  . Obesity   . Pancreatitis 2008  . Periodic limb movement 04/02/2017  . 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 Presence of permanent cardiac pacemaker   . Retinal detachment    Bilateral, laser surgery on the right  . RLS (restless legs syndrome) 04/22/2015  . Thyroid disease   . Ulnar neuropathy at elbow    bilateral    Patient Active Problem List   Diagnosis Date Noted  . Skin infection, bacterial 04/14/2017  . Periodic limb movement 04/02/2017  . Nocturnal leg cramps 04/02/2017  . Dyslipidemia associated with type 2 diabetes mellitus ([place]) 09/26/2016  . Hypertension associated with stage 3 chronic kidney disease due to type 2 diabetes mellitus ([place]) 09/26/2016  . Uncontrolled type 2  diabetes mellitus with diabetic neuropathy, with long-term current use of insulin ([place]) 09/26/2016  . 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 Uncontrolled type 2 diabetes mellitus with stage 4 chronic kidney disease, with long-term current use of insulin ([place]) 09/26/2016  . Combined forms of age-related cataract of left eye 04/27/2016  . Combined forms of age-related cataract of right eye 04/13/2016  . CKD stage 4 due to type 2 diabetes mellitus ([place]) 04/11/2016  . RLS (restless legs syndrome) 04/22/2015  . Tubular adenoma of colon  01/07/2015  . Bilateral elbow joint pain 09/30/2013  . Left carpal tunnel syndrome 09/30/2013  . ED (erectile dysfunction) 09/30/2013  . Panic disorder with agoraphobia 07/29/2013  . Allergic rhinitis 05/29/2013  . Back pain 05/13/2013  . DDD (degenerative disc disease) 05/13/2013  . Abnormality of gait 04/08/2013  . Diabetic polyneuropathy ([place]) 04/08/2013  . Foot drop, bilateral 04/08/2013  . Hip pain, right 03/23/2013  . Disequilibrium 03/23/2013  . Poor vision 11/28/2012  . OSA (obstructive sleep apnea) 02/02/2012  . Carotid bruit 11/01/2011  . Numbness and tingling in hands 11/01/2011  . Insomnia 07/28/2011  . Anxiety 07/23/2011  . Stress 07/23/2011  . Chronic systolic heart failure ([place])   . Automatic implantable cardioverter-defibrillator in situ 03/10/2010  . HYPERTENSION, BENIGN 12/28/2009  . VENTRICULAR TACHYCARDIA 12/28/2009  . Coronary atherosclerosis of native coronary artery 09/20/2009  . FATIGUE 09/14/2009  . Diabetes mellitus, insulin dependent (IDDM), uncontrolled ([place]) 05/04/2008  . Depression with anxiety 05/04/2008  . Hyperlipidemia 07/10/2007  . Morbid obesity ([place]) 07/10/2007  . NICOTINE ADDICTION 07/10/2007    Past Surgical History:  Procedure Laterality Date  . APPENDECTOMY  mid 1990  . CARDIAC DEFIBRILLATOR PLACEMENT    . CARDIAC SURGERY    . CARPAL TUNNEL WITH CUBITAL TUNNEL  02/22/2012   Procedure: CARPAL TUNNEL WITH CUBITAL TUNNEL;  Surgeon: Roseanne Kaufman, MD;  Location: [facility];  Service: Orthopedics;  Laterality: Right;  Right Carpal Tunnel Release/Right Cubital Tunnel Release and Transposition if Necessary with Flexor Pronator Release  . COLONOSCOPY WITH PROPOFOL N/A 11/17/2014   Procedure: COLONOSCOPY WITH PROPOFOL;  Surgeon: Manus Gunning, MD;  Location: [facility];  Service: Gastroenterology;  Laterality: N/A;  . COLONOSCOPY WITH PROPOFOL N/A 01/05/2015   Procedure: COLONOSCOPY WITH PROPOFOL;  Surgeon: Manus Gunning, MD;   Location: [facility];  Service: Gastroenterology;  Laterality: N/A;  . CORONARY ARTERY BYPASS GRAFT    . coronary artery bypass grafting x4  june 29,2011   x 4  . PACEMAKER PLACEMENT    . RETINAL DETACHMENT SURGERY Bilateral   . RETINAL DETACHMENT SURGERY Bilateral    3 months ago  . right sided abdominal cyst removal    . TONSILLECTOMY  1998  . ULNAR NERVE TRANSPOSITION Right         Home Medications    Prior to Admission medications   Medication Sig Start Date End Date Taking? Authorizing Provider  acetaminophen (TYLENOL) 500 MG tablet Take 1,000 mg by mouth as needed.   Yes [provider]  aspirin 325 MG tablet Take 325 mg by mouth daily.     Yes [provider]  baclofen (LIORESAL) 20 MG tablet Take 1 tablet (20 mg total) by mouth 3 (three) times daily. 04/02/17  Yes Kathrynn Ducking, MD  busPIRone (BUSPAR) 10 MG tablet Take 1 tablet (10 mg total) by mouth 2 (two) times daily. 04/11/17  Yes  Cloria Spring, MD  doxycycline (VIBRAMYCIN) 100 MG capsule Take 1 capsule (100 mg total) by mouth 2 (two) times daily. Take with food. 06/23/17  Yes Kandra Nicolas, MD  FLUoxetine (PROZAC) 20 MG capsule Take 1 capsule (20 mg total) by mouth daily. 04/11/17 04/11/18 Yes Cloria Spring, MD  fluticasone [facility]) 50 MCG/ACT nasal spray Place 2 sprays into both nostrils daily. 04/11/17  Yes [name], MD  furosemide (LASIX) 40 MG tablet Take 1 tablet (40 mg total) by mouth daily. 12/06/16  Yes Evans Lance, MD  insulin NPH-regular Human (NOVOLIN 70/30) (70-30) 100 UNIT/ML injection Inject 35 Units into the skin 2 (two) times daily with a meal.  09/26/16  Yes [provider]  Melatonin 10 MG TABS Take 10 mg by mouth at bedtime.   Yes [provider]  methylcellulose (ARTIFICIAL TEARS) 1 % ophthalmic solution Place 1 drop into both eyes 2 (two) times daily as needed (dry eyes).   Yes [provider]  metoprolol succinate (TOPROL-XL) 50 MG 24 hr  tablet Take 1 tablet (50 mg total) by mouth daily. Please make overdue appt with Dr. Lovena Le before any more refills. 1st attempt 06/04/17  Yes Evans Lance, MD  nitroGLYCERIN (NITROSTAT) 0.4 MG SL tablet Place 1 tablet (0.4 mg total) under the tongue every 5 (five) minutes as needed for chest pain. 01/21/16 01/26/20 Yes Evans Lance, MD  Propylhexedrine [facility]) INHA Place 1 each into the nose 2 (two) times daily as needed (congestion).    Yes [provider]  rosuvastatin (CRESTOR) 40 MG tablet Take 1 tablet (40 mg total) by mouth daily. 10/13/16  Yes [name], MD  Insulin Glargine-Lixisenatide 100-33 UNT-MCG/ML SOPN Inject 40 Units into the skin daily. Willeen Niece 02/18/16   [provider]  Multiple Vitamin (MULTIVITAMIN) tablet Take 1 tablet by mouth daily.      [provider]  Omega-3 Fatty Acids (FISH OIL BURP-LESS PO) Take 1 tablet by mouth daily.      [provider]  pramipexole (MIRAPEX) 0.125 MG tablet Take 1 tablet (0.125 mg total) by mouth at bedtime. Patient not taking: Reported on 06/25/2017 04/02/17   Kathrynn Ducking, MD    Family History Family History  Problem Relation Age of Onset  . Allergies Mother   . Diabetes Mother   . Hypertension Mother   . Hyperlipidemia Mother   . Dementia Mother   . Colon cancer Mother   . Lung cancer Mother   . Kidney cancer Mother   . Colon polyps Mother   . Stroke Father   . Allergies Father   . Dementia Father   . Alcohol abuse Father   . Colon polyps Father   . Diabetes Sister   . Heart disease Sister   . Anxiety disorder Sister   . Cancer Maternal Grandmother        unknown type  . Schizophrenia Paternal Uncle   . Schizophrenia Cousin     Social History Social History   Tobacco Use  . Smoking status: Former Smoker    Types: Cigars  . Smokeless tobacco: Current User    Types: Snuff  . Tobacco comment: Occasional cigar  Substance Use Topics  . Alcohol use: No     Alcohol/week: 0.0 oz    Comment: rarely  . Drug use: No     Allergies   Ace inhibitors; Cephalexin; and Beta adrenergic blockers   Review of Systems Review of Systems  Skin:  Positive for color change.  All other systems reviewed and are negative.    Physical Exam Updated Vital Signs BP (!) 152/82   Pulse 68   Temp 98.3 F (36.8 C)   Resp 17   Ht 5\' 9"  (1.753 m)   Wt 115.7 kg (255 lb)   SpO2 97%   BMI 37.66 kg/m   Physical Exam  Constitutional: He is oriented to person, place, and time. He appears well-developed and well-nourished.  HENT:  Head: Normocephalic and atraumatic.  Mouth/Throat: Oropharynx is clear and moist.  Eyes: Pupils are equal, round, and reactive to light. Conjunctivae and EOM are normal.  Neck: Normal range of motion.  Cardiovascular: Normal rate, regular rhythm and normal heart sounds.  Pulmonary/Chest: Effort normal and breath sounds normal.  Abdominal: Soft. Bowel sounds are normal.  Musculoskeletal: Normal range of motion.  Significant cellulitis of the right volar forearm, starting to wrap around to dorsal aspect; does appear to have small amount of fluctuance at the ulnar aspect of the wrist, small amount of crusted drainage noted; compartments are soft, compressible (see photos below)  Neurological: He is alert and oriented to person, place, and time.  Skin: Skin is warm and dry.  Psychiatric: He has a normal mood and affect.  Nursing note and vitals reviewed.       ED Treatments / Results  Labs (all labs ordered are listed, but only abnormal results are displayed) Labs Reviewed  BASIC METABOLIC PANEL - Abnormal; Notable for the following components:      Result Value   Sodium 128 (*)    Chloride 98 (*)    CO2 15 (*)    Glucose, Bld 158 (*)    BUN 66 (*)    Creatinine, Ser 4.54 (*)    GFR calc non Af Amer 13 (*)    GFR calc Af Amer 16 (*)    All other components within normal limits  CBC WITH DIFFERENTIAL/PLATELET -  Abnormal; Notable for the following components:   WBC 16.5 (*)    RBC 4.14 (*)    Hemoglobin 11.6 (*)    HCT 34.2 (*)    Platelets 134 (*)    Neutro Abs 14.1 (*)    Monocytes Absolute 1.3 (*)    All other components within normal limits  URINALYSIS, ROUTINE W REFLEX MICROSCOPIC - Abnormal; Notable for the following components:   APPearance HAZY (*)    Glucose, UA 50 (*)    Hgb urine dipstick MODERATE (*)    Protein, ur 100 (*)    Bacteria, UA RARE (*)    Squamous Epithelial / LPF 0-5 (*)    All other components within normal limits  CULTURE, BLOOD (ROUTINE X 2)  I-STAT CG4 LACTIC ACID, ED    EKG None  Radiology No results found.  Procedures Procedures (including critical care time)  Medications Ordered in ED Medications  vancomycin (VANCOCIN) 2,000 mg in sodium chloride 0.9 % 500 mL IVPB (2,000 mg Intravenous Transfusing/Transfer 06/26/17 0502)  piperacillin-tazobactam (ZOSYN) IVPB 3.375 g (has no administration in time range)  sodium chloride 0.9 % bolus 1,000 mL (1,000 mLs Intravenous Transfusing/Transfer 06/26/17 0441)  piperacillin-tazobactam (ZOSYN) IVPB 3.375 g (0 g Intravenous Stopped 06/26/17 0459)     Initial Impression / Assessment and Plan / ED Course  I have reviewed the triage vital signs and the nursing notes.  Pertinent labs & imaging results that were available during my care of the patient were reviewed by me and considered in my  medical decision making (see chart for details).  52 year old male here with significant cellulitis of right forearm.  Has been worsening despite antibiotic therapy with doxycycline.  He has pain with ROM of the wrist, but this seems mostly due to swelling.  Compartments are soft, compressible.  He does have small amount of fluctuance along ulnar aspect of the volar wrist, no active drainage but some crusting.  Labs with leukocytosis of 16.5.  He also has evidence of AKI with creatinine increased to 4.54 (baseline is around 2.5-2.8  compared with prior values).  Blood cultures have been sent.  Will start broad spectrum vanc/zosyn.  Given IVF for AKI.  May be of benefit to have hand evaluate in the morning given location and rapid progression.  Discussed with Dr. Myna Hidalgo-- he will admit for ongoing care.  Hand surgery can be consulted in the AM.  Final Clinical Impressions(s) / ED Diagnoses   Final diagnoses:  Cellulitis of right upper extremity  AKI (acute kidney injury) [facility])    ED Discharge Orders    None       Larene Pickett, PA-C 06/26/17 0529    Jola Schmidt, MD 06/26/17 [phone]

## 2017-06-26 NOTE — Transfer of Care (Signed)
Immediate Anesthesia Transfer of Care Note  Patient: Matthew [name]  Procedure(s) Performed: IRRIGATION AND DEBRIDEMENT RIGHT FOREARM (Right Arm Lower)  Patient Location: PACU  Anesthesia Type:General  Level of Consciousness: oriented, sedated, drowsy, patient cooperative and responds to stimulation  Airway & Oxygen Therapy: Patient Spontanous Breathing and Patient connected to nasal cannula oxygen  Post-op Assessment: Report given to RN, Post -op Vital signs reviewed and stable and Patient moving all extremities X 4  Post vital signs: Reviewed and stable  Last Vitals:  Vitals Value Taken Time  BP 147/92 06/26/2017  9:41 PM  Temp 36.3 C 06/26/2017  9:39 PM  Pulse 89 06/26/2017  9:46 PM  Resp 14 06/26/2017  9:46 PM  SpO2 99 % 06/26/2017  9:46 PM  Vitals shown include unvalidated device data.  Last Pain:  Vitals:   06/26/17 2139  TempSrc:   PainSc: 0-No pain      Patients Stated Pain Goal: 0 (89/37/34 2876)  Complications: No apparent anesthesia complications

## 2017-06-26 NOTE — Anesthesia Postprocedure Evaluation (Signed)
Anesthesia Post Note  Patient: Matthew [name]  Procedure(s) Performed: IRRIGATION AND DEBRIDEMENT RIGHT FOREARM (Right Arm Lower)     Patient location during evaluation: PACU Anesthesia Type: General Level of consciousness: awake and alert, oriented and patient cooperative Pain management: pain level controlled Vital Signs Assessment: post-procedure vital signs reviewed and stable Respiratory status: spontaneous breathing, nonlabored ventilation and respiratory function stable Cardiovascular status: blood pressure returned to baseline and stable Postop Assessment: no apparent nausea or vomiting Anesthetic complications: no    Last Vitals:  Vitals:   06/26/17 1152 06/26/17 2139  BP: (!) 151/77 (!) 147/92  Pulse: 89 90  Resp:  13  Temp: 36.6 C (!) 36.3 C  SpO2: 96% 97%    Last Pain:  Vitals:   06/26/17 2139  TempSrc:   PainSc: 0-No pain                 Thaily Hackworth,E. Ekta Dancer

## 2017-06-26 NOTE — Consult Note (Signed)
Reason for Consult: Right forearm infection Referring Physician: Hospitalist service  Matthew [name] is an 52 y.o. male.  HPI: Patient presents for evaluation of his right forearm.  I reviewed his chart in detail.  Patient was seen in urgent care on 20 April and preliminarily treated.  His condition is worsened.  He was admitted last night and I was called today in regards to his predicament.  He has had a CT scan which is concerning for abscess and his exam is certainly consistent with an abscess likely originating in the distal forearm.  I discussed with him all issues.  He is sensate but has difficulty making a fist.  This is been an ongoing issue for approximately 7 days now.  I reviewed his chart.  He does have a host of medical issues.  Past Medical History:  Diagnosis Date  . Abnormality of gait 04/08/2013  . AICD (automatic cardioverter/defibrillator) present   . Anxiety    stress  . Anxiety and depression   . CAD (coronary artery disease)    a. OOH MI 6/11; presented with acute CHF; hosp course c/b VF arrest with VDRF, etc;   b s/p CABG: L-LAD/Dx, S-OM/dCFX;  c.Nuclear scan 8/12:  Inferior and inf-lat scar with minimal peri-infarct ischemia, EF 53%.; d.LHC 9/12:  LAD 90%, pD1 (small) 70%, prox large Dx 95%, L-LAD and Dx ok, CFX occluded, S-OM and CFX ok, RCA prox to prox/mid 60-70%.  Medical management     . Cardiac arrest - ventricular fibrillation 08/26/2009  . Carpal tunnel syndrome of right wrist   . Cellulitis 2012   spider bite  . Cellulitis 06/2017   right upper extremity  . Chronic systolic heart failure ([place])   . CKD (chronic kidney disease)   . Coma ([place])   . Diabetic coma with ketoacidosis ([place])    Occured in 1994 where he spent 30 days in a coma . He develpoed infection of his leg and developing pancreatitis during that  time   . DM2 (diabetes mellitus, type 2) ([place])   . Dysrhythmia   . Foot drop, bilateral 04/08/2013  . Headache(784.0)    sinus  . History of  degenerative disc disease   . HLD (hyperlipidemia)   . HTN (hypertension)   . Hypercholesteremia   . Ischemic cardiomyopathy    echo 10/11:  inf-septal and apical HK, EF 35%, mod LAE  . Neuropathy   . Nocturnal leg cramps   . Nocturnal leg cramps 04/02/2017  . Obesity   . Pancreatitis 2008  . Periodic limb movement 04/02/2017  . 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 Presence of permanent cardiac pacemaker   . Retinal detachment    Bilateral, laser surgery on the right  . RLS (restless legs syndrome) 04/22/2015  . Thyroid disease   . 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 APPENDECTOMY  mid 1990  . CARDIAC DEFIBRILLATOR PLACEMENT    . CARDIAC SURGERY    . CARPAL TUNNEL WITH CUBITAL TUNNEL  02/22/2012   Procedure: CARPAL TUNNEL WITH CUBITAL TUNNEL;  Surgeon: Roseanne Kaufman, MD;  Location: [facility];  Service: Orthopedics;  Laterality: Right;  Right Carpal Tunnel Release/Right Cubital Tunnel Release and Transposition if Necessary with Flexor Pronator Release  . CATARACT EXTRACTION    . COLONOSCOPY WITH PROPOFOL N/A 11/17/2014   Procedure: COLONOSCOPY WITH PROPOFOL;  Surgeon: Manus Gunning, MD;  Location: [facility];  Service: Gastroenterology;  Laterality: N/A;  .  COLONOSCOPY WITH PROPOFOL N/A 01/05/2015   Procedure: COLONOSCOPY WITH PROPOFOL;  Surgeon: Manus Gunning, MD;  Location: [facility];  Service: Gastroenterology;  Laterality: N/A;  . CORONARY ARTERY BYPASS GRAFT    . coronary artery bypass grafting x4  june 29,2011   x 4  . PACEMAKER PLACEMENT    . RETINAL DETACHMENT SURGERY Bilateral   . RETINAL DETACHMENT SURGERY Bilateral    3 months ago  . right sided abdominal cyst removal    . TONSILLECTOMY  1998  . ULNAR NERVE TRANSPOSITION Right     Family History  Problem Relation Age of Onset  . Allergies Mother   . Diabetes Mother   . Hypertension Mother   . Hyperlipidemia Mother   .  Dementia Mother   . Colon cancer Mother   . Lung cancer Mother   . Kidney cancer Mother   . Colon polyps Mother   . Stroke Father   . Allergies Father   . Dementia Father   . Alcohol abuse Father   . Colon polyps Father   . Diabetes Sister   . Heart disease Sister   . Anxiety disorder Sister   . Cancer Maternal Grandmother        unknown type  . Schizophrenia Paternal Uncle   . Schizophrenia Cousin     Social History:  reports that he has quit smoking. His smoking use included cigars. His smokeless tobacco use includes snuff. He reports that he does not drink alcohol or use drugs.  Allergies:  Allergies  Allergen Reactions  . Ace Inhibitors Cough  . Cephalexin Other (See Comments)    Resistant to antibiotic  . Beta Adrenergic Blockers Cough    Medications: I have reviewed the patient's current medications.  Results for orders placed or performed during the hospital encounter of 06/25/17 (from the past 48 hour(s))  Basic metabolic panel     Status: Abnormal   Collection Time: 06/25/17  9:04 PM  Result Value Ref Range   Sodium 128 (L) 135 - 145 mmol/L   Potassium 3.7 3.5 - 5.1 mmol/L   Chloride 98 (L) 101 - 111 mmol/L   CO2 15 (L) 22 - 32 mmol/L   Glucose, Bld 158 (H) 65 - 99 mg/dL   BUN 66 (H) 6 - 20 mg/dL   Creatinine, Ser 4.54 (H) 0.61 - 1.24 mg/dL   Calcium 9.6 8.9 - 10.3 mg/dL   GFR calc non Af Amer 13 (L) >60 mL/min   GFR calc Af Amer 16 (L) >60 mL/min    Comment: (NOTE) The eGFR has been calculated using the CKD EPI equation. This calculation has not been validated in all clinical situations. eGFR's persistently <60 mL/min signify possible Chronic Kidney Disease.    Anion gap 15 5 - 15    Comment: Performed at [facility] [address], [place], [place] [address]  CBC with Differential/Platelet     Status: Abnormal   Collection Time: 06/25/17  9:04 PM  Result Value Ref Range   WBC 16.5 (H) 4.0 - 10.5 K/uL   RBC 4.14 (L) 4.22 - 5.81 MIL/uL    Hemoglobin 11.6 (L) 13.0 - 17.0 g/dL   HCT 34.2 (L) 39.0 - 52.0 %   MCV 82.6 78.0 - 100.0 fL   MCH 28.0 26.0 - 34.0 pg   MCHC 33.9 30.0 - 36.0 g/dL   RDW 14.8 11.5 - 15.5 %   Platelets 134 (L) 150 - 400 K/uL   Neutrophils Relative %  86 %   Neutro Abs 14.1 (H) 1.7 - 7.7 K/uL   Lymphocytes Relative 6 %   Lymphs Abs 1.0 0.7 - 4.0 K/uL   Monocytes Relative 8 %   Monocytes Absolute 1.3 (H) 0.1 - 1.0 K/uL   Eosinophils Relative 0 %   Eosinophils Absolute 0.1 0.0 - 0.7 K/uL   Basophils Relative 0 %   Basophils Absolute 0.0 0.0 - 0.1 K/uL    Comment: Performed at [facility] [address] [place], [place] [address]  Culture, blood (routine x 2)     Status: None (Preliminary result)   Collection Time: 06/25/17  9:04 PM  Result Value Ref Range   Specimen Description BLOOD RIGHT ANTECUBITAL    Special Requests      BOTTLES DRAWN AEROBIC AND ANAEROBIC Blood Culture adequate volume   Culture      NO GROWTH < 12 HOURS Performed at [facility] [address] [place], [place] [address]    Report Status PENDING   Urinalysis, Routine w reflex microscopic     Status: Abnormal   Collection Time: 06/25/17  9:10 PM  Result Value Ref Range   Color, Urine YELLOW YELLOW   APPearance HAZY (A) CLEAR   Specific Gravity, Urine 1.005 1.005 - 1.030   pH 5.0 5.0 - 8.0   Glucose, UA 50 (A) NEGATIVE mg/dL   Hgb urine dipstick MODERATE (A) NEGATIVE   Bilirubin Urine NEGATIVE NEGATIVE   Ketones, ur NEGATIVE NEGATIVE mg/dL   Protein, ur 100 (A) NEGATIVE mg/dL   Nitrite NEGATIVE NEGATIVE   Leukocytes, UA NEGATIVE NEGATIVE   RBC / HPF 0-5 0 - 5 RBC/hpf   WBC, UA 0-5 0 - 5 WBC/hpf   Bacteria, UA RARE (A) NONE SEEN   Squamous Epithelial / LPF 0-5 (A) NONE SEEN   Mucus PRESENT    Amorphous Crystal PRESENT     Comment: Performed at [facility], [place] [address], [place], [place] [address]  I-Stat CG4 Lactic Acid, ED     Status: None   Collection Time: 06/25/17  9:49 PM  Result Value  Ref Range   Lactic Acid, Venous 1.68 0.5 - 1.9 mmol/L  Glucose, capillary     Status: Abnormal   Collection Time: 06/26/17  6:46 AM  Result Value Ref Range   Glucose-Capillary 139 (H) 65 - 99 mg/dL  CBC     Status: Abnormal   Collection Time: 06/26/17 10:03 AM  Result Value Ref Range   WBC 14.3 (H) 4.0 - 10.5 K/uL   RBC 3.79 (L) 4.22 - 5.81 MIL/uL   Hemoglobin 10.8 (L) 13.0 - 17.0 g/dL   HCT 31.3 (L) 39.0 - 52.0 %   MCV 82.6 78.0 - 100.0 fL   MCH 28.5 26.0 - 34.0 pg   MCHC 34.5 30.0 - 36.0 g/dL   RDW 14.9 11.5 - 15.5 %   Platelets 126 (L) 150 - 400 K/uL    Comment: Performed at [facility], [place] [address], [place], [place] [address]  Creatinine, serum     Status: Abnormal   Collection Time: 06/26/17 10:03 AM  Result Value Ref Range   Creatinine, Ser 4.53 (H) 0.61 - 1.24 mg/dL   GFR calc non Af Amer 14 (L) >60 mL/min   GFR calc Af Amer 16 (L) >60 mL/min    Comment: (NOTE) The eGFR has been calculated using the CKD EPI equation. This calculation has not been validated in all clinical situations.  eGFR's persistently <60 mL/min signify possible Chronic Kidney Disease. Performed at [facility], [place] [address], [place], [place] [address]   Glucose, capillary     Status: Abnormal   Collection Time: 06/26/17 11:04 AM  Result Value Ref Range   Glucose-Capillary 262 (H) 65 - 99 mg/dL   Comment 1 Notify RN    Comment 2 Document in Chart   Glucose, capillary     Status: Abnormal   Collection Time: 06/26/17  3:52 PM  Result Value Ref Range   Glucose-Capillary 188 (H) 65 - 99 mg/dL    Ct Forearm Right Wo Contrast  Result Date: 06/26/2017 CLINICAL DATA:  Cellulitis of the volar mid forearm. EXAM: CT OF THE RIGHT FOREARM WITHOUT CONTRAST TECHNIQUE: Multidetector CT imaging was performed according to the standard protocol. Multiplanar CT image reconstructions were also generated. COMPARISON:  None. FINDINGS: Bones/Joint/Cartilage No acute fracture or dislocation. No  osseous destruction. Joint spaces are preserved. Bone mineralization is normal. Ligaments Suboptimally assessed by CT. Muscles and Tendons There are foci of soft tissue gas with prominent fluid along the distal flexor carpi ulnaris myotendinous junction. There is enlargement and hypodensity of the distal flexor carpi ulnaris muscle. Remaining flexor and extensor muscles and tendons are grossly unremarkable. Soft tissues Prominent skin thickening and subcutaneous edema along the volar forearm and wrist. Vascular calcifications. IMPRESSION: 1. Foci of soft tissue gas and prominent fluid along the distal flexor carpi ulnaris myotendinous junction with enlargement and hypodensity of the distal muscle, concerning for necrotizing fasciitis/myositis. 2. Prominent skin thickening and subcutaneous edema along the volar forearm and wrist, consistent with cellulitis given clinical history. Electronically Signed   By: Titus Dubin M.D.   On: 06/26/2017 14:39    Review of Systems  Constitutional: Negative for chills and weight loss.  HENT: Negative.   Respiratory: Negative.   Gastrointestinal: Negative.   Genitourinary: Negative.    Blood pressure (!) 151/77, pulse 89, temperature 97.9 F (36.6 C), temperature source Oral, resp. rate 16, height _0  (1.753 m), weight 115.7 kg (255 lb), SpO2 96 %. Physical Exam Right forearm has erythema throughout the volar aspect.  He has an abrasion with what appears to be a small abscess over the distal portion of the FCU near the pisiform insertion.  He has no evidence of compartment syndrome but a very swollen distal forearm as noted on his exam.  He does have refill throughout the hand.  The fingers are soft and the infection does not spread into the flexor sheaths of the finger at present time.  He notes no prior history of infection to these regions.  His left upper extremity has IV access and is stable.  His white count is 16,000.  I reviewed his CT scan and his  labs.  The patient has concern in my opinion for FCU infectious tenosynovitis.  Abdomen is nontender protuberant and soft.  Chest has equal breath sounds and he has no labored breathing patterns.  Lower extremity examination shows no signs of DVT infection or problems. Assessment/Plan: Infection right forearm with ascending cellulitis and what appears to be a part rule out flexor tenosynovitis.  I would recommend surgical irrigation and debridement and continued IV antibiotics.  I discussed with him these issues and he would like to proceed accordingly.  I discussed relevant findings concerns and all aspects of his care as germane to his predicament.  We are planning surgery for your upper extremity. The risk and benefits of surgery to include risk  of bleeding, infection, anesthesia,  damage to normal structures and failure of the surgery to accomplish its intended goals of relieving symptoms and restoring function have been discussed in detail. With this in mind we plan to proceed. I have specifically discussed with the patient the pre-and postoperative regime and the dos and don'ts and risk and benefits in great detail. Risk and benefits of surgery also include risk of dystrophy(CRPS), chronic nerve pain, failure of the healing process to go onto completion and other inherent risks of surgery The relavent the pathophysiology of the disease/injury process, as well as the alternatives for treatment and postoperative course of action has been discussed in great detail with the patient who desires to proceed.  We will do everything in our power to help you (the patient) restore function to the upper extremity. It is a pleasure to see this patient today.   Satira Anis Merrit Friesen III 06/26/2017, 5:05 PM

## 2017-06-26 NOTE — H&P (Signed)
History and Physical    Matthew [name] ION:[id number] DOB: Jul 30, 1965 DOA: 06/25/2017  PCP: [name], MD  Patient coming from: Home  I have personally briefly reviewed patient's old medical records in [place]  Chief Complaint: Right arm pain and swelling  HPI: Matthew [name] is a 52 y.o. male with medical history significant of hypertension, diabetes, chronic kidney disease stage IV, chronic systolic congestive heart failure, history of nonsustained VT status post ICD, coronary artery disease status post CABG.  Patient reports that approximately 1 week ago, he woke up from sleeping and noticed an area of erythema over his right wrist.  He had associated pain and decreased grip in his right hand.  He had gone to see urgent care after the next few days and was given an injection of antibiotics and started on a oral course of doxycycline.  Despite taking antibiotics, erythema progressed up his forearm.  He continues to have fevers at night.  Denies any nausea or vomiting.  No diarrhea.  He has noted increased urinary frequency but denies any dysuria.  ED Course: Patient was evaluated in the emergency room while he was noted to have an elevated WBC count.  Creatinine was elevated above baseline.  Vitals were noted to be stable.  Due to progression of his erythema/cellulitis.  He is been referred for admission.  Review of Systems: As per HPI otherwise 10 point review of systems negative.    Past Medical History:  Diagnosis Date  . Abnormality of gait 04/08/2013  . AICD (automatic cardioverter/defibrillator) present   . Anxiety    stress  . Anxiety and depression   . CAD (coronary artery disease)    a. OOH MI 6/11; presented with acute CHF; hosp course c/b VF arrest with VDRF, etc;   b s/p CABG: L-LAD/Dx, S-OM/dCFX;  c.Nuclear scan 8/12:  Inferior and inf-lat scar with minimal peri-infarct ischemia, EF 53%.; d.LHC 9/12:  LAD 90%, pD1 (small) 70%, prox large Dx 95%, L-LAD and Dx  ok, CFX occluded, S-OM and CFX ok, RCA prox to prox/mid 60-70%.  Medical management     . Cardiac arrest - ventricular fibrillation 08/26/2009  . Carpal tunnel syndrome of right wrist   . Cellulitis 2012   spider bite  . Chronic systolic heart failure ([place])   . CKD (chronic kidney disease)   . Coma ([place])   . Diabetic coma with ketoacidosis ([place])    Occured in 1994 where he spent 30 days in a coma . He develpoed infection of his leg and developing pancreatitis during that  time   . DM2 (diabetes mellitus, type 2) ([place])   . Dysrhythmia   . Foot drop, bilateral 04/08/2013  . Headache(784.0)    sinus  . History of degenerative disc disease   . HLD (hyperlipidemia)   . HTN (hypertension)   . Hypercholesteremia   . Ischemic cardiomyopathy    echo 10/11:  inf-septal and apical HK, EF 35%, mod LAE  . Neuropathy   . Nocturnal leg cramps   . Nocturnal leg cramps 04/02/2017  . Obesity   . Pancreatitis 2008  . Periodic limb movement 04/02/2017  . 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 Presence of permanent cardiac pacemaker   . Retinal detachment    Bilateral, laser surgery on the right  . RLS (restless legs syndrome) 04/22/2015  . Thyroid disease   . 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  APPENDECTOMY  mid 1990  . CARDIAC DEFIBRILLATOR PLACEMENT    . CARDIAC SURGERY    . CARPAL TUNNEL WITH CUBITAL TUNNEL  02/22/2012   Procedure: CARPAL TUNNEL WITH CUBITAL TUNNEL;  Surgeon: Roseanne K[place], MD;  Location: [facility];  Service: Orthopedics;  Laterality: Right;  Right Carpal Tunnel Release/Right Cubital Tunnel Release and Transposition if Necessary with Flexor Pronator Release  . COLONOSCOPY WITH PROPOFOL N/A 11/17/2014   Procedure: COLONOSCOPY WITH PROPOFOL;  Surgeon: Manus Gunning, MD;  Location: [facility];  Service: Gastroenterology;  Laterality: N/A;  . COLONOSCOPY WITH PROPOFOL N/A 01/05/2015   Procedure: COLONOSCOPY  WITH PROPOFOL;  Surgeon: Manus Gunning, MD;  Location: [facility];  Service: Gastroenterology;  Laterality: N/A;  . CORONARY ARTERY BYPASS GRAFT    . coronary artery bypass grafting x4  june 29,2011   x 4  . PACEMAKER PLACEMENT    . RETINAL DETACHMENT SURGERY Bilateral   . RETINAL DETACHMENT SURGERY Bilateral    3 months ago  . right sided abdominal cyst removal    . TONSILLECTOMY  1998  . ULNAR NERVE TRANSPOSITION Right      reports that he has quit smoking. His smoking use included cigars. His smokeless tobacco use includes snuff. He reports that he does not drink alcohol or use drugs.  Allergies  Allergen Reactions  . Ace Inhibitors Cough  . Cephalexin Other (See Comments)    Resistant to antibiotic  . Beta Adrenergic Blockers Cough    Family History  Problem Relation Age of Onset  . Allergies Mother   . Diabetes Mother   . Hypertension Mother   . Hyperlipidemia Mother   . Dementia Mother   . Colon cancer Mother   . Lung cancer Mother   . Kidney cancer Mother   . Colon polyps Mother   . Stroke Father   . Allergies Father   . Dementia Father   . Alcohol abuse Father   . Colon polyps Father   . Diabetes Sister   . Heart disease Sister   . Anxiety disorder Sister   . Cancer Maternal Grandmother        unknown type  . Schizophrenia Paternal Uncle   . Schizophrenia Cousin      Prior to Admission medications   Medication Sig Start Date End Date Taking? Authorizing Provider  acetaminophen (TYLENOL) 500 MG tablet Take 1,000 mg by mouth as needed.   Yes [provider]  aspirin 325 MG tablet Take 325 mg by mouth daily.     Yes [provider]  baclofen (LIORESAL) 20 MG tablet Take 1 tablet (20 mg total) by mouth 3 (three) times daily. 04/02/17  Yes Kathrynn Ducking, MD  busPIRone (BUSPAR) 10 MG tablet Take 1 tablet (10 mg total) by mouth 2 (two) times daily. 04/11/17  Yes Cloria Spring, MD  FLUoxetine (PROZAC) 20 MG capsule Take 1 capsule  (20 mg total) by mouth daily. 04/11/17 04/11/18 Yes Cloria Spring, MD  fluticasone [facility]) 50 MCG/ACT nasal spray Place 2 sprays into both nostrils daily. 04/11/17  Yes [name], MD  furosemide (LASIX) 40 MG tablet Take 1 tablet (40 mg total) by mouth daily. 12/06/16  Yes Evans Lance, MD  insulin NPH-regular Human (NOVOLIN 70/30) (70-30) 100 UNIT/ML injection Inject 35 Units into the skin 2 (two) times daily with a meal.  09/26/16  Yes [provider]  Melatonin 10 MG TABS Take 10 mg by mouth at bedtime.   Yes [provider]  methylcellulose (ARTIFICIAL TEARS) 1 % ophthalmic solution Place 1 drop into both eyes 2 (two) times daily as needed (dry eyes).   Yes [provider]  metoprolol succinate (TOPROL-XL) 50 MG 24 hr tablet Take 1 tablet (50 mg total) by mouth daily. Please make overdue appt with Dr. Lovena Le before any more refills. 1st attempt 06/04/17  Yes Evans Lance, MD  nitroGLYCERIN (NITROSTAT) 0.4 MG SL tablet Place 1 tablet (0.4 mg total) under the tongue every 5 (five) minutes as needed for chest pain. 01/21/16 01/26/20 Yes Evans Lance, MD  Propylhexedrine [facility]) INHA Place 1 each into the nose 2 (two) times daily as needed (congestion).    Yes [provider]  rosuvastatin (CRESTOR) 40 MG tablet Take 1 tablet (40 mg total) by mouth daily. 10/13/16  Yes [name], MD  Insulin Glargine-Lixisenatide 100-33 UNT-MCG/ML SOPN Inject 40 Units into the skin daily. Willeen Niece 02/18/16   [provider]  Multiple Vitamin (MULTIVITAMIN) tablet Take 1 tablet by mouth daily.      [provider]  Omega-3 Fatty Acids (FISH OIL BURP-LESS PO) Take 1 tablet by mouth daily.      [provider]  pramipexole (MIRAPEX) 0.125 MG tablet Take 1 tablet (0.125 mg total) by mouth at bedtime. Patient not taking: Reported on 06/25/2017 04/02/17   Kathrynn Ducking, MD    Physical Exam: Vitals:   06/25/17 2045 06/25/17 2049  06/26/17 0051 06/26/17 0431  BP: (!) 155/102  (!) 152/82 (!) 161/87  Pulse: 99  68 91  Resp: 16  17 16   Temp: 98.9 F (37.2 C)  98.3 F (36.8 C)   TempSrc: Oral     SpO2: 100%  97% 96%  Weight:  115.7 kg (255 lb)    Height:  5\' 9"  (1.753 m)      Constitutional: NAD, calm, comfortable Vitals:   06/25/17 2045 06/25/17 2049 06/26/17 0051 06/26/17 0431  BP: (!) 155/102  (!) 152/82 (!) 161/87  Pulse: 99  68 91  Resp: 16  17 16   Temp: 98.9 F (37.2 C)  98.3 F (36.8 C)   TempSrc: Oral     SpO2: 100%  97% 96%  Weight:  115.7 kg (255 lb)    Height:  5\' 9"  (1.753 m)     Eyes: PERRL, lids and conjunctivae normal ENMT: Mucous membranes are moist. Posterior pharynx clear of any exudate or lesions.Normal dentition.  Neck: normal, supple, no masses, no thyromegaly Respiratory: clear to auscultation bilaterally, no wheezing, no crackles. Normal respiratory effort. No accessory muscle use.  Cardiovascular: Regular rate and rhythm, no murmurs / rubs / gallops.  1+ extremity edema, (chronic). 2+ pedal pulses. No carotid bruits.  Abdomen: no tenderness, no masses palpated. No hepatosplenomegaly. Bowel sounds positive.  Musculoskeletal: no clubbing / cyanosis. No joint deformity upper and lower extremities. Good ROM, no contractures. Normal muscle tone.  Skin: Large area of erythema that is warm and tender over right forearm extending from wrist to elbow.  Area of fluctuance on the medial aspect of right wrist.  No drainage appreciated Neurologic: CN 2-12 grossly intact. Sensation intact, DTR normal. Strength 5/5 in all 4.  Psychiatric: Normal judgment and insight. Alert and oriented x 3. Normal mood.   Labs on Admission: I have personally reviewed following labs and imaging studies  CBC: Recent Labs  Lab 06/25/17 2104  WBC 16.5*  NEUTROABS 14.1*  HGB 11.6*  HCT 34.2*  MCV 82.6  PLT 134*  Basic Metabolic Panel: Recent Labs  Lab 06/25/17 2104  NA 128*  K 3.7  CL 98*  CO2 15*    GLUCOSE 158*  BUN 66*  CREATININE 4.54*  CALCIUM 9.6   GFR: Estimated Creatinine Clearance: 23.6 mL/min (A) (by C-G formula based on SCr of 4.54 mg/dL (H)). Liver Function Tests: No results for input(s): AST, ALT, ALKPHOS, BILITOT, PROT, ALBUMIN in the last 168 hours. No results for input(s): LIPASE, AMYLASE in the last 168 hours. No results for input(s): AMMONIA in the last 168 hours. Coagulation Profile: No results for input(s): INR, PROTIME in the last 168 hours. Cardiac Enzymes: No results for input(s): CKTOTAL, CKMB, CKMBINDEX, TROPONINI in the last 168 hours. BNP (last 3 results) No results for input(s): PROBNP in the last 8760 hours. HbA1C: No results for input(s): HGBA1C in the last 72 hours. CBG: Recent Labs  Lab 06/26/17 0646  GLUCAP 139*   Lipid Profile: No results for input(s): CHOL, HDL, LDLCALC, TRIG, CHOLHDL, LDLDIRECT in the last 72 hours. Thyroid Function Tests: No results for input(s): TSH, T4TOTAL, FREET4, T3FREE, THYROIDAB in the last 72 hours. Anemia Panel: No results for input(s): VITAMINB12, FOLATE, FERRITIN, TIBC, IRON, RETICCTPCT in the last 72 hours. Urine analysis:    Component Value Date/Time   COLORURINE YELLOW 06/25/2017 2110   APPEARANCEUR HAZY (A) 06/25/2017 2110   LABSPEC 1.005 06/25/2017 2110   PHURINE 5.0 06/25/2017 2110   GLUCOSEU 50 (A) 06/25/2017 2110   HGBUR MODERATE (A) 06/25/2017 2110   BILIRUBINUR NEGATIVE 06/25/2017 2110   KETONESUR NEGATIVE 06/25/2017 2110   PROTEINUR 100 (A) 06/25/2017 2110   UROBILINOGEN 1.0 08/31/2009 1526   NITRITE NEGATIVE 06/25/2017 2110   LEUKOCYTESUR NEGATIVE 06/25/2017 2110    Radiological Exams on Admission: No results found.  Assessment/Plan Principal Problem:   Cellulitis Active Problems:   HYPERTENSION, BENIGN   Chronic systolic heart failure (HCC)   CKD stage 4 due to type 2 diabetes mellitus ([place])   Dyslipidemia associated with type 2 diabetes mellitus ([place])   Uncontrolled type 2  diabetes mellitus with stage 4 chronic kidney disease, with long-term current use of insulin (HCC)   Right forearm cellulitis   AKI (acute kidney injury) ([place])     1. Cellulitis of right forearm.  There is an area of fluctuance in her right wrist that may need incision and drainage.  Hand surgery has been consulted.  He is on intravenous antibiotics of vancomycin and Zosyn. 2. Acute kidney injury on chronic kidney disease stage IV.  Likely related to volume depletion.  Hold Lasix at this time.  Continue on IV fluids.  Recheck labs in a.m.  If renal failure does not improve, can consider nephrology input. 3. Chronic systolic congestive heart failure.  Ejection fraction 35%.  Appears compensated at this time.  Hold Lasix. 4. Diabetes.  Continue on 70/30 insulin, at lower dose.  Continue sliding scale insulin. 5. Hyperlipidemia.  Continue statin 6. History of nonsustained VT.  Status post ICD. 7. Hypertension.  Continue on metoprolol.  Blood pressure currently stable. 8. Metabolic acidosis.  Likely related to volume depletion and infection.  Continue IV fluids and follow in a.m.  DVT prophylaxis: Heparin Code Status: Full code Family Communication: No family present Disposition Plan: Discharge home once infection has improved Consults called: Hand surgery, [place] Admission status: Inpatient, MedSurg   Kathie Dike MD [facility] Pager [phone]  If 7PM-7AM, please contact night-coverage [email or web address] Password [facility]  06/26/2017, 9:57 AM

## 2017-06-26 NOTE — Progress Notes (Signed)
Pharmacy Antibiotic Note  Matthew [name] is a 52 y.o. male admitted on 06/25/2017 with cellulitis.  Pharmacy has been consulted for vancomycin dosing. Pt was previously given doxycycline and IM Rocephin last week with no improvement of cellulitis on right arm. Gave LD Vancomycin 2g IV x 1 on 4/22 and started Zosyn 3.375 g every 8 hours. Pt was admitted with an AKI. Current Scr 4.53 (BL 2.5-3) so no additional vancomycin has been given since LD. 4/22 blood cultures neg (pending).  Plan: Vancomycin 1500 mg every 48 hours. Goal trough 10-15 mcg/mL. Obtain vancomycin trough when appropriate. Adjust Zosyn dose to 2.25 g every 6 hours. Monitor kidney function and cultures.  Height: 5\' 9"  (175.3 cm) Weight: 255 lb (115.7 kg) IBW/kg (Calculated) : 70.7  Temp (24hrs), Avg:98.5 F (36.9 C), Min:97.9 F (36.6 C), Max:98.9 F (37.2 C)  Recent Labs  Lab 06/25/17 2104 06/25/17 2149 06/26/17 1003  WBC 16.5*  --  14.3*  CREATININE 4.54*  --  4.53*  LATICACIDVEN  --  1.68  --     Estimated Creatinine Clearance: 23.7 mL/min (A) (by C-G formula based on SCr of 4.53 mg/dL (H)).    Allergies  Allergen Reactions  . Ace Inhibitors Cough  . Cephalexin Other (See Comments)    Resistant to antibiotic  . Beta Adrenergic Blockers Cough    Antimicrobials this admission: 4/23 vancomycin >>  4/23 Zosyn >>   Dose adjustments this admission: Zosyn 2.25 g every 6 hours.  Microbiology results: 4/22 BCx: IP  Thank you for allowing pharmacy to be a part of this patient's care.  Julieta Bellini 06/26/2017 2:29 PM

## 2017-06-26 NOTE — Plan of Care (Signed)
  Problem: Education: Goal: Knowledge of General Education information will improve Outcome: Progressing   Problem: Health Behavior/Discharge Planning: Goal: Ability to manage health-related needs will improve Outcome: Progressing   Problem: Clinical Measurements: Goal: Ability to maintain clinical measurements within normal limits will improve Outcome: Progressing Goal: Will remain free from infection Outcome: Progressing Goal: Diagnostic test results will improve Outcome: Progressing Goal: Respiratory complications will improve Outcome: Progressing Goal: Cardiovascular complication will be avoided Outcome: Progressing   Problem: Activity: Goal: Risk for activity intolerance will decrease Outcome: Progressing   Problem: Nutrition: Goal: Adequate nutrition will be maintained Outcome: Progressing   Problem: Coping: Goal: Level of anxiety will decrease Outcome: Progressing   Problem: Elimination: Goal: Will not experience complications related to bowel motility Outcome: Progressing Goal: Will not experience complications related to urinary retention Outcome: Progressing   Problem: Pain Managment: Goal: General experience of comfort will improve Outcome: Progressing   Problem: Safety: Goal: Ability to remain free from injury will improve Outcome: Progressing   Problem: Skin Integrity: Goal: Risk for impaired skin integrity will decrease Outcome: Progressing   Problem: Clinical Measurements: Goal: Postoperative complications will be avoided or minimized Outcome: Progressing   Problem: Skin Integrity: Goal: Demonstration of wound healing without infection will improve Outcome: Progressing   Problem: Clinical Measurements: Goal: Ability to avoid or minimize complications of infection will improve Outcome: Progressing   Problem: Skin Integrity: Goal: Skin integrity will improve Outcome: Progressing

## 2017-06-26 NOTE — Op Note (Signed)
Please see full operative dictation [phone]  Patient had a massive infection about the distal third of his right forearm I performed nerve release fasciectomy and tenotomy of the FCU tendon.  His fascia was necrotic he had massive amounts of thick purulent fluid and this required an extensive debridement.  The wound was left open.  I will return him to the surgical theater Thursday afternoon late for a repeat irrigation and debridement procedure.  I do not have a timeframe for closure of the wound until his conditions  Improve.  I would recommend vancomycin and Zosyn and will await the cultures.  Hermie Reagor MD

## 2017-06-26 NOTE — ED Notes (Signed)
Attempted to call report

## 2017-06-27 ENCOUNTER — Encounter (HOSPITAL_COMMUNITY): Payer: Self-pay | Admitting: Orthopedic Surgery

## 2017-06-27 LAB — GLUCOSE, CAPILLARY
Glucose-Capillary: 160 mg/dL — ABNORMAL HIGH (ref 65–99)
Glucose-Capillary: 223 mg/dL — ABNORMAL HIGH (ref 65–99)
Glucose-Capillary: 270 mg/dL — ABNORMAL HIGH (ref 65–99)
Glucose-Capillary: 180 mg/dL — ABNORMAL HIGH (ref 65–99)

## 2017-06-27 LAB — BASIC METABOLIC PANEL
ANION GAP: 15 (ref 5–15)
BUN: 70 mg/dL — ABNORMAL HIGH (ref 6–20)
CO2: 13 mmol/L — AB (ref 22–32)
Calcium: 8.9 mg/dL (ref 8.9–10.3)
Chloride: 102 mmol/L (ref 101–111)
Creatinine, Ser: 4.39 mg/dL — ABNORMAL HIGH (ref 0.61–1.24)
GFR calc Af Amer: 16 mL/min — ABNORMAL LOW (ref >60)
GFR calc non Af Amer: 14 mL/min — ABNORMAL LOW (ref >60)
GLUCOSE: 160 mg/dL — AB (ref 65–99)
POTASSIUM: 3.7 mmol/L (ref 3.5–5.1)
Sodium: 130 mmol/L — ABNORMAL LOW (ref 135–145)

## 2017-06-27 LAB — SURGICAL PCR SCREEN
MRSA, PCR: POSITIVE — AB
STAPHYLOCOCCUS AUREUS: POSITIVE — AB

## 2017-06-27 LAB — CBC
HEMATOCRIT: 30.8 % — AB (ref 39.0–52.0)
HEMOGLOBIN: 10.7 g/dL — AB (ref 13.0–17.0)
MCH: 29 pg (ref 26.0–34.0)
MCHC: 34.7 g/dL (ref 30.0–36.0)
MCV: 83.5 fL (ref 78.0–100.0)
Platelets: 131 10*3/uL — ABNORMAL LOW (ref 150–400)
RBC: 3.69 MIL/uL — ABNORMAL LOW (ref 4.22–5.81)
RDW: 15.1 % (ref 11.5–15.5)
WBC: 13.8 10*3/uL — ABNORMAL HIGH (ref 4.0–10.5)

## 2017-06-27 LAB — HIV ANTIBODY (ROUTINE TESTING W REFLEX): HIV Screen 4th Generation wRfx: NONREACTIVE

## 2017-06-27 MED ORDER — OXYCODONE HCL 5 MG PO TABS
5.0000 mg | ORAL_TABLET | ORAL | Status: DC | PRN
  Administered 2017-06-28 – 2017-06-29 (×3): 10 mg via ORAL
  Administered 2017-06-30: 5 mg via ORAL
  Filled 2017-06-27: qty 1
  Filled 2017-06-27 (×3): qty 2

## 2017-06-27 MED ORDER — METHOCARBAMOL 1000 MG/10ML IJ SOLN
500.0000 mg | Freq: Four times a day (QID) | INTRAVENOUS | Status: DC | PRN
  Filled 2017-06-27: qty 5

## 2017-06-27 MED ORDER — METHOCARBAMOL 500 MG PO TABS
500.0000 mg | ORAL_TABLET | Freq: Four times a day (QID) | ORAL | Status: DC | PRN
  Administered 2017-06-27 – 2017-06-30 (×4): 500 mg via ORAL
  Filled 2017-06-27 (×5): qty 1

## 2017-06-27 MED ORDER — CHLORHEXIDINE GLUCONATE CLOTH 2 % EX PADS
6.0000 | MEDICATED_PAD | Freq: Every day | CUTANEOUS | Status: AC
  Administered 2017-06-29 – 2017-07-02 (×4): 6 via TOPICAL

## 2017-06-27 MED ORDER — VITAMIN C 500 MG PO TABS
1000.0000 mg | ORAL_TABLET | Freq: Every day | ORAL | Status: DC
  Administered 2017-06-27 – 2017-07-03 (×7): 1000 mg via ORAL
  Filled 2017-06-27 (×7): qty 2

## 2017-06-27 MED ORDER — MUPIROCIN 2 % EX OINT
1.0000 "application " | TOPICAL_OINTMENT | Freq: Two times a day (BID) | CUTANEOUS | Status: AC
  Administered 2017-06-28 – 2017-07-02 (×10): 1 via NASAL
  Filled 2017-06-27 (×4): qty 22

## 2017-06-27 MED ORDER — METHOCARBAMOL 1000 MG/10ML IJ SOLN: 500.0000 mg | Freq: Four times a day (QID) | INTRAVENOUS | Status: DC | PRN

## 2017-06-27 MED ORDER — MORPHINE SULFATE (PF) 2 MG/ML IV SOLN
2.0000 mg | INTRAVENOUS | Status: DC | PRN
Start: 2017-06-27 — End: 2017-07-03

## 2017-06-27 MED ORDER — SENNOSIDES-DOCUSATE SODIUM 8.6-50 MG PO TABS
1.0000 | ORAL_TABLET | Freq: Two times a day (BID) | ORAL | Status: DC
  Administered 2017-06-27 – 2017-07-01 (×7): 1 via ORAL
  Filled 2017-06-27 (×10): qty 1

## 2017-06-27 MED ORDER — METHOCARBAMOL 500 MG PO TABS: 500.0000 mg | ORAL_TABLET | Freq: Four times a day (QID) | ORAL | Status: DC | PRN

## 2017-06-27 NOTE — Op Note (Signed)
Matthew [name], Matthew [name]               ACCOUNT NO.:  [id number]  MEDICAL RECORD NO.:  [id number]  LOCATION:  [facility]                         FACILITY:  [facility]  PHYSICIAN:  Satira Anis. Chrishun Scheer, M.D.DATE OF BIRTH:  Jan 16, 1966  DATE OF PROCEDURE: DATE OF DISCHARGE:                              OPERATIVE REPORT   PREOPERATIVE DIAGNOSIS:  Right forearm erythema/cellulitis and worsening infection pattern with CT evidence of soft tissue, air, and abscess formation along the flexor carpi ulnaris.  POSTOPERATIVE DIAGNOSIS:  Pervasive deep abscess and infection involving the flexor carpi ulnaris as well as the contents of the carpal canal and Guyon's canal requiring aggressive irrigation and debridement and resection of nonviable tissue including the flexor carpi ulnaris.  SURGICAL PROCEDURES: 1. Incision and drainage, deep abscess, right forearm. 2. Median nerve release at the forearm and wrist region as well as     carpal tunnel region, right forearm, wrist, and hand. 3. Guyon's canal release, extensive in nature, right forearm, wrist,     and hand. 4. Radical tenolysis, tenosynovectomy, flexor tendons, right forearm. 5. Ulnar nerve neurolysis, right forearm. 6. Wrist dissection, flexor carpi ulnaris musculotendinous junction to     the insertion about the pisiform.  This was a tenotomy and tendon     resection given its frayed nonviable nature in massive infection. 7. Fasciotomy and fasciectomy, right forearm secondary to advanced     infectious process and a necrotizing look to the fascia. 8. Fasciectomy, dorsal ulnar fascia about the ECU region of the     forearm.  SURGEON:  Satira Anis. Amedeo Plenty, MD.  ASSISTANT:  None.  COMPLICATIONS:  None.  ANESTHESIA:  General.  TOURNIQUET TIME:  Less than an hour.  DRAINS:  One Penrose drain.  INDICATIONS:  The patient has had an indolent infection, worsening over the course of 7 days.  He has been seen at the urgent cares, etc., as the chart  reflects.  At the present time, I have been asked to see him. I saw him and immediately booked him for surgery after reviewing the CT scan and discussing his issues.  He understands the gravity of the situation.  OPERATIVE PROCEDURE:  The patient was seen by myself and Anesthesia, taken to the operative theater, underwent smooth induction of general anesthetic, prepped with Hibiclens, prescrubbed, followed by 10 minutes surgical Betadine scrub and paint.  Outline marks were made visually. Following this, I performed a modified Brunner incision centered over the pisiform and Guyon's canal.  I immediately encountered very thick chocolaty type abscess material.  This was cultured for aerobic and anaerobic cultures.  Immediately was quite concerned given the look of the fascia and thus made a proximal incision along the volar ulnar aspect of the forearm.  Skin flaps were elevated and the fascia looked very poor in the distal third of the forearm.  At this time, it was quite clear that we had a significant necrotizing type look to the forearm and thus, I made wide planes between the subcu and the fascia and then started performing evaluation of the areas.  I performed a fasciectomy, fasciotomy about the volar, forearm, and wrist regions.  I then swung over and identified the  median nerve.  I performed a median nerve neurolysis and carpal tunnel release.  I then performed an FCU decompression and a fascia release dorsally and volarly.  I removed nonviable tissue.  Following this, we then performed a radical flexor tenolysis, tenosynovectomy removing devitalized tissue and pre necrotic tissue about the tendons of the FDP and FDS.  The FCU was identified.  It was quite abnormal, frayed, and nonviable in my estimation.  At this time, I identified the ulnar nerve and artery proximally in the forearm and then dissected down through Guyon's canal released these, retracted these areas, and then  performed an FCU tenotomy with excision of the FCU beginning at the musculotendinous junction to the insertion on the pisiform.  I then performed fasciectomy about the ECU tendon as well.  The median nerve, ulnar nerve, and ulnar artery were identified and protected as were the radial artery.  This was very important in the process of the I and D not to injure these structures.  Nevertheless, it was quite difficult as the fascia was completely abnormal, thickened, and yellow to greyish in nature.  Thus, I was very careful to remove all nonviable fascia about Guyon's canal, the carpal canal, and the distal third of the forearm including the wrist region.  The proximal two- thirds of the forearm looked very viable and looked excellent.  At this time, I deflated the tourniquet, the hand pinked up nicely.  We irrigated with 6 L of saline.  Once this was completed, I checked all structures.  I was pleased with the debridement, left the wound open except for 1 tacking suture to cover the neurovascular structures.  The patient then had Mepitel and a standard dressing of Neosporin and gauze as well as a long-arm splint with ample amounts of ABDs, Kerlix, Webril, and associated soft tissue sleeve components.  The patient tolerated this well.  He had a nice pink hand, warm in nature and soft compartments.  He was awoken and transferred to the recovery room.  We will plan for continued IV antibiotics.  I will bring him back Thursday for second look and we will see how things go.  I have no definitive on when this infection will be under control.  He has a massive infection, necrosis of the fascia, and a very high white count.  Thus, we need to have all hands on board to try to give him the best opportunity for a functional extremity.  Do's and don'ts have been discussed.  All questions have been encouraged and answered.     Satira Anis. Amedeo Plenty, M.D.     [facility]  D:  06/26/2017  T:   06/26/2017  Job:  [id number]

## 2017-06-27 NOTE — Progress Notes (Signed)
PROGRESS NOTE    Matthew [name]  FIE:[id number] DOB: Oct 18, 1965 DOA: 06/25/2017  PCP: [name], MD   Brief Narrative:  52 y.o. male with medical history significant for hypertension, diabetes, chronic kidney disease stage IV, chronic systolic congestive heart failure, history of nonsustained VT status post ICD, coronary artery disease status post CABG. Pt presented with 1 week of erythema of right wrist along with decreased grip and weakness. He went to urgent care and was given an injection of antibiotics and started on a oral course of doxycycline. Cellulitis progressively worsened.    Assessment & Plan:   Principal Problem:   Cellulitis, right forearm / Leukocytosis  - S/P nerve release fasciectomy and tenotomy of the FCU tendon. Per ortho, pt fascia was necrotic and he had massive amounts of thick purulent fluid and required an extensive debridement. The wound was left open. Pt will require another surgery, per ortho schedule  - Continue vanco and zosyn - Follow up cx results - Appreciate ortho following  Active Problems:   HYPERTENSION, BENIGN - BP stable - Continue metoprolol     Chronic systolic heart failure ([place]) - Compensated     CKD stage 4 due to type 2 diabetes mellitus ([place]) - Follow up daily BMP   DVT prophylaxis: SCD's Code Status: full code  Family Communication: no family at the bedside this am Disposition Plan: home once cleared by ortho   Consultants:   Ortho  Procedures:  I&D 4/23  Antimicrobials:   Vanco and zosyn 4/22 -->   Subjective: No overnight events.  Objective: Vitals:   06/26/17 2242 06/27/17 0328 06/27/17 1416 06/27/17 1944  BP: (!) 144/82 119/71 (!) 128/31 (!) 136/51  Pulse: 85 70 85 88  Resp: 19 16    Temp: 98.5 F (36.9 C) 98.1 F (36.7 C) 97.6 F (36.4 C) 97.7 F (36.5 C)  TempSrc: Oral Oral Oral Oral  SpO2: 95% 97% 97% 98%  Weight:      Height:        Intake/Output Summary (Last 24 hours) at  06/27/2017 2212 Last data filed at 06/27/2017 1810 Gross per 24 hour  Intake 340 ml  Output 1200 ml  Net -860 ml   Filed Weights   06/25/17 2049  Weight: 115.7 kg (255 lb)    Examination:  General exam: Appears calm and comfortable  Respiratory system: Clear to auscultation. Respiratory effort normal. Cardiovascular system: S1 & S2 heard, RRR. No JVD, murmurs, rubs, gallops or clicks. No pedal edema. Gastrointestinal system: Abdomen is nondistended, soft and nontender. No organomegaly or masses felt. Normal bowel sounds heard. Central nervous system: Alert and oriented. No focal neurological deficits. Extremities: Symmetric 5 x 5 power. Skin: right forearm dressing in palce Psychiatry: Judgement and insight appear normal. Mood & affect appropriate.   Data Reviewed: I have personally reviewed following labs and imaging studies  CBC: Recent Labs  Lab 06/25/17 2104 06/26/17 1003 06/27/17 0558  WBC 16.5* 14.3* 13.8*  NEUTROABS 14.1*  --   --   HGB 11.6* 10.8* 10.7*  HCT 34.2* 31.3* 30.8*  MCV 82.6 82.6 83.5  PLT 134* 126* 166*   Basic Metabolic Panel: Recent Labs  Lab 06/25/17 2104 06/26/17 1003 06/27/17 0558  NA 128*  --  130*  K 3.7  --  3.7  CL 98*  --  102  CO2 15*  --  13*  GLUCOSE 158*  --  160*  BUN 66*  --  70*  CREATININE 4.54* 4.53* 4.39*  CALCIUM 9.6  --  8.9   GFR: Estimated Creatinine Clearance: 24.4 mL/min (A) (by C-G formula based on SCr of 4.39 mg/dL (H)). Liver Function Tests: No results for input(s): AST, ALT, ALKPHOS, BILITOT, PROT, ALBUMIN in the last 168 hours. No results for input(s): LIPASE, AMYLASE in the last 168 hours. No results for input(s): AMMONIA in the last 168 hours. Coagulation Profile: No results for input(s): INR, PROTIME in the last 168 hours. Cardiac Enzymes: No results for input(s): CKTOTAL, CKMB, CKMBINDEX, TROPONINI in the last 168 hours. BNP (last 3 results) No results for input(s): PROBNP in the last 8760  hours. HbA1C: No results for input(s): HGBA1C in the last 72 hours. CBG: Recent Labs  Lab 06/26/17 2141 06/27/17 0603 06/27/17 1132 06/27/17 1653 06/27/17 2113  GLUCAP 132* 160* 223* 270* 180*   Lipid Profile: No results for input(s): CHOL, HDL, LDLCALC, TRIG, CHOLHDL, LDLDIRECT in the last 72 hours. Thyroid Function Tests: No results for input(s): TSH, T4TOTAL, FREET4, T3FREE, THYROIDAB in the last 72 hours. Anemia Panel: No results for input(s): VITAMINB12, FOLATE, FERRITIN, TIBC, IRON, RETICCTPCT in the last 72 hours. Urine analysis:    Component Value Date/Time   COLORURINE YELLOW 06/25/2017 2110   APPEARANCEUR HAZY (A) 06/25/2017 2110   LABSPEC 1.005 06/25/2017 2110   PHURINE 5.0 06/25/2017 2110   GLUCOSEU 50 (A) 06/25/2017 2110   HGBUR MODERATE (A) 06/25/2017 2110   BILIRUBINUR NEGATIVE 06/25/2017 2110   KETONESUR NEGATIVE 06/25/2017 2110   PROTEINUR 100 (A) 06/25/2017 2110   UROBILINOGEN 1.0 08/31/2009 1526   NITRITE NEGATIVE 06/25/2017 2110   LEUKOCYTESUR NEGATIVE 06/25/2017 2110   Sepsis Labs: @LABRCNTIP (procalcitonin:4,lacticidven:4)   ) Recent Results (from the past 240 hour(s))  Culture, blood (routine x 2)     Status: None (Preliminary result)   Collection Time: 06/25/17  9:04 PM  Result Value Ref Range Status   Specimen Description BLOOD RIGHT ANTECUBITAL  Final   Special Requests   Final    BOTTLES DRAWN AEROBIC AND ANAEROBIC Blood Culture adequate volume   Culture   Final    NO GROWTH 2 DAYS Performed at [facility], [place] [address], [place], [place] [address]    Report Status PENDING  Incomplete  Aerobic/Anaerobic Culture (surgical/deep wound)     Status: None (Preliminary result)   Collection Time: 06/26/17  8:35 PM  Result Value Ref Range Status   Specimen Description ABSCESS RIGHT FOREARM  Final   Special Requests PATIENT ON FOLLOWING ZOSYN AND VANC  Final   Gram Stain   Final    ABUNDANT WBC PRESENT, PREDOMINANTLY PMN FEW GRAM  POSITIVE COCCI    Culture   Final    TOO YOUNG TO READ Performed at [facility], [place] [address], [place], [place] [address]    Report Status PENDING  Incomplete  Surgical pcr screen     Status: Abnormal   Collection Time: 06/27/17 12:59 PM  Result Value Ref Range Status   MRSA, PCR POSITIVE (A) NEGATIVE Final    Comment: RESULT CALLED TO, READ BACK BY AND VERIFIED WITH: Burna Mortimer RN 15:40 06/27/17 ([email or web address])    Staphylococcus aureus POSITIVE (A) NEGATIVE Final    Comment: (NOTE) The Xpert SA Assay (FDA approved for NASAL specimens in patients 77 years of age and older), is one component of a comprehensive surveillance program. It is not intended to diagnose infection nor to guide or monitor treatment. Performed at [facility], [place] [address], [place], [place] [address]  Radiology Studies: Ct Forearm Right Wo Contrast  Result Date: 06/26/2017 CLINICAL DATA:  Cellulitis of the volar mid forearm. EXAM: CT OF THE RIGHT FOREARM WITHOUT CONTRAST TECHNIQUE: Multidetector CT imaging was performed according to the standard protocol. Multiplanar CT image reconstructions were also generated. COMPARISON:  None. FINDINGS: Bones/Joint/Cartilage No acute fracture or dislocation. No osseous destruction. Joint spaces are preserved. Bone mineralization is normal. Ligaments Suboptimally assessed by CT. Muscles and Tendons There are foci of soft tissue gas with prominent fluid along the distal flexor carpi ulnaris myotendinous junction. There is enlargement and hypodensity of the distal flexor carpi ulnaris muscle. Remaining flexor and extensor muscles and tendons are grossly unremarkable. Soft tissues Prominent skin thickening and subcutaneous edema along the volar forearm and wrist. Vascular calcifications. IMPRESSION: 1. 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 2. Prominent skin thickening and subcutaneous edema along the volar forearm and wrist, consistent with cellulitis given clinical history. Electronically Signed   By: Titus Dubin M.D.   On: 06/26/2017 14:39        Scheduled Meds: . aspirin EC  325 mg Oral Daily  . baclofen  20 mg Oral TID  . busPIRone  10 mg Oral BID  . FLUoxetine  20 mg Oral Daily  . fluticasone  2 spray Each Nare Daily  . insulin aspart  0-15 Units Subcutaneous TID WC  . insulin aspart  0-5 Units Subcutaneous QHS  . insulin aspart protamine- aspart  20 Units Subcutaneous BID WC  . metoprolol succinate  50 mg Oral Daily  . mupirocin ointment  1 application Nasal BID  . oxymetazoline  1 spray Each Nare BID  . rosuvastatin  40 mg Oral Daily  . senna-docusate  1 tablet Oral BID  . vitamin C  1,000 mg Oral Daily   Continuous Infusions: . methocarbamol (ROBAXIN)  IV    . piperacillin-tazobactam (ZOSYN)  IV Stopped (06/27/17 2117)  . [START ON 06/28/2017] vancomycin       LOS: 1 day    Time spent: 25 minutes Greater than 50% of the time spent on counseling and coordinating the care.   Leisa Lenz, MD [facility] Pager [phone]  If 7PM-7AM, please contact night-coverage [email or web address] Password [facility] [place] 06/27/2017, 10:12 PM

## 2017-06-27 NOTE — Consult Note (Signed)
Patient is seen and examined at bedside.  He is awake alert and oriented.  He had a significant surgical event last night where we performed irrigation and debridement of his forearm to include excision of the FCU tendon and tenolysis tenosynovectomy with exploration and decompression of his median and ulnar nerve.  I am noting that he has good looking parameters today in terms of refill, motion, sensation to the hand.  His nerves appear to be functioning nicely.  We discussed surgical events.  At present time I would recommend vancomycin and Zosyn and await the final cultures.  I left his wound open with Penrose drains.  Today we will plan for elevation and range of motion with edema control.  I would like for him to work diligently on edema control motion and massage to the fingers.  I have discussed with him that we will return to the operative theater tomorrow for repeat irrigation debridement tenolysis tenosynovectomy and repair is necessary.  If conditions look pristine we will close the wound.  If he has continued reaccumulation of infectious material he will have a third debridement at Saturday.  I discussed with him all issues.  Once again this is with very impressive/massive infection.  There is a large amount of soft tissue destruction and necrotizing elements to the fascia which were aggressively/radically excised.  We have discussed with the patient the issues regarding their infection to the extremity. We will continue antibiotics and await culture results. Often times it will take 3-5 days for cultures to become final. During this time we will typically have the patient on intravenous antibiotics until we can find a parenteral route of antibiotic regime specific for the bacteria or organism isolated. We have discussed with the patient the need for daily irrigation and debridement as well as therapy to the area. We have discussed with the patient the necessity of range of motion to the  involved joints as discussed today. We have discussed with the patient the unpredictability of infections at times. We'll continue to work towards good pain control and restoration of function. The patient understands the need for meticulous wound care and the necessity of proper followup.  The possible complications of stiffness (loss of motion), resistant infection, possible deep bone infection, possible chronic pain issues, possible need for multiple surgeries and even amputation.  With this in mind the patient understands our goal is to eradicate the infection to quiesence. We will continue to work towards these goals.  Harpreet Pompey MD cell phone [phone]

## 2017-06-27 NOTE — Evaluation (Signed)
Occupational Therapy Evaluation Patient Details Name: Matthew [name] MRN: [id number] DOB: 1965-04-23 Today's Date: 06/27/2017    History of Present Illness 52 y.o. male with medical history significant of hypertension, diabetes, chronic kidney disease stage IV, chronic systolic congestive heart failure, history of nonsustained VT status post ICD, coronary artery disease status post CABG. Pt presented on 4/22 and found to have Right forearm erythema/cellulitis and worsening infection pattern with CT evidence of soft tissue, air, and abscess formation along the flexor carpi ulnaris. Pt is now s/p nerve release fasciectomy and tenotomy of the FCU tendon. Planning for repeat I&D on 4/25   Clinical Impression   This 53 y/o male presents with the above. Pt currently with RUE functional limitations impacting his overall functional performance. Educated pt regarding RUE ROM and edema reduction techniques. Pt also requiring MinA for UB/LB ADLs and stand pivot transfers at this time. Will continue to follow acutely for progression of RUE and to maximize his overall safety and independence with ADLs and mobility.     Follow Up Recommendations  Follow surgeon's recommendation for DC plan and follow-up therapies;Supervision - Intermittent;Other (comment)(to be further assessed after additional procedure )    Equipment Recommendations  3 in 1 bedside commode    Recommendations for Other Services Other (comment)(may need to consider PT consult pending pt progress with mobility )     Precautions / Restrictions Precautions Precautions: Fall Restrictions Weight Bearing Restrictions: No Other Position/Activity Restrictions: no formal order for RUE wt bearing at this time      Mobility Bed Mobility Overal bed mobility: Needs Assistance Bed Mobility: Supine to Sit     Supine to sit: Min guard;HOB elevated     General bed mobility comments: MinGuard for safety   Transfers Overall transfer level:  Needs assistance Equipment used: Rolling walker (2 wheeled) Transfers: Sit to/from [facility] Sit to Stand: Min assist Stand pivot transfers: Min assist       General transfer comment: increased time and assist to rise and steady at RW, VCs for hand placement; assist to steady and to guide hips during stand pivot to recliner                                               ADL either performed or assessed with clinical judgement   ADL Overall ADL's : Needs assistance/impaired                     Lower Body Dressing: Minimal assistance;Sit to/from stand Lower Body Dressing Details (indicate cue type and reason): pt able to reach and adjust socks seated EOB              Functional mobility during ADLs: Minimal assistance;Rolling walker(for stand pivot transfer ) General ADL Comments: focus of session on RUE digit ROM and edema reduction techniques with education provided throughout; completed retrograde massage to digits with pt return demonstrating                          Pertinent Vitals/Pain Pain Assessment: Faces Faces Pain Scale: Hurts little more Pain Location: R hand with ROM  Pain Descriptors / Indicators: Grimacing;Guarding Pain Intervention(s): Monitored during session;Repositioned     Hand Dominance Right   Extremity/Trunk Assessment Upper Extremity Assessment Upper Extremity Assessment: RUE deficits/detail RUE Deficits / Details: pt able  to move digits moderately within available range; denies numbness/tingling  RUE: Unable to fully assess due to immobilization RUE Coordination: decreased fine motor   Lower Extremity Assessment Lower Extremity Assessment: Generalized weakness       Communication Communication Communication: No difficulties   Cognition Arousal/Alertness: Awake/alert Behavior During Therapy: WFL for tasks assessed/performed Overall Cognitive Status: Within Functional Limits for tasks  assessed                                            Exercises Hand Exercises Digit Composite Flexion: AROM;AAROM;10 reps;Right;Supine Composite Extension: AROM;AAROM;Right;Supine Other Exercises Other Exercises: instructed pt in modified tendon gliding exercises x10 reps each motion         Home Living Family/patient expects to be discharged to:: Private residence Living Arrangements: Parent(dad ) Available Help at Discharge: Family                                    Prior Functioning/Environment Level of Independence: Independent with assistive device(s)        Comments: uses cane for ambulation         OT Problem List: Impaired UE functional use;Pain;Decreased range of motion;Decreased activity tolerance      OT Treatment/Interventions: Self-care/ADL training;DME and/or AE instruction;Therapeutic activities;Therapeutic exercise;Patient/family education;Balance training    OT Goals(Current goals can be found in the care plan section) Acute Rehab OT Goals Patient Stated Goal: maintain independence  OT Goal Formulation: With patient Time For Goal Achievement: 07/11/17 Potential to Achieve Goals: Good  OT Frequency: Min 3X/week                             AM-PAC PT "6 Clicks" Daily Activity     Outcome Measure Help from another person eating meals?: A Little Help from another person taking care of personal grooming?: A Little Help from another person toileting, which includes using toliet, bedpan, or urinal?: A Little Help from another person bathing (including washing, rinsing, drying)?: A Little Help from another person to put on and taking off regular upper body clothing?: A Little Help from another person to put on and taking off regular lower body clothing?: A Little 6 Click Score: 18   End of Session Equipment Utilized During Treatment: Rolling walker Nurse Communication: Mobility status  Activity Tolerance: Patient  tolerated treatment well Patient left: in chair;with call bell/phone within reach  OT Visit Diagnosis: Muscle weakness (generalized) (M62.81);Pain Pain - Right/Left: Right Pain - part of body: Arm;Hand                Time: 2956-2130 OT Time Calculation (min): 17 min Charges:  OT General Charges $OT Visit: 1 Visit OT Evaluation $OT Eval Moderate Complexity: 1 Mod G-Codes:     Matthew [name], OT Pager [phone] 06/27/2017   Matthew [name] 06/27/2017, 4:49 PM

## 2017-06-28 ENCOUNTER — Encounter (HOSPITAL_COMMUNITY)
Admission: EM | Disposition: A | Discharge status: Home / Self Care | Payer: Self-pay | Source: Home / Self Care | Attending: Internal Medicine

## 2017-06-28 ENCOUNTER — Inpatient Hospital Stay (HOSPITAL_COMMUNITY): Payer: Medicare Other | Admitting: Anesthesiology

## 2017-06-28 HISTORY — PX: I & D EXTREMITY: SHX5045

## 2017-06-28 LAB — GLUCOSE, CAPILLARY
GLUCOSE-CAPILLARY: 151 mg/dL — AB (ref 65–99)
Glucose-Capillary: 167 mg/dL — ABNORMAL HIGH (ref 65–99)
Glucose-Capillary: 143 mg/dL — ABNORMAL HIGH (ref 65–99)
Glucose-Capillary: 113 mg/dL — ABNORMAL HIGH (ref 65–99)
Glucose-Capillary: 107 mg/dL — ABNORMAL HIGH (ref 65–99)
Glucose-Capillary: 193 mg/dL — ABNORMAL HIGH (ref 65–99)

## 2017-06-28 LAB — BASIC METABOLIC PANEL
ANION GAP: 13 (ref 5–15)
BUN: 70 mg/dL — ABNORMAL HIGH (ref 6–20)
CALCIUM: 8.9 mg/dL (ref 8.9–10.3)
CO2: 16 mmol/L — AB (ref 22–32)
Chloride: 105 mmol/L (ref 101–111)
Creatinine, Ser: 4.59 mg/dL — ABNORMAL HIGH (ref 0.61–1.24)
GFR, EST AFRICAN AMERICAN: 15 mL/min — AB (ref >60)
GFR, EST NON AFRICAN AMERICAN: 13 mL/min — AB (ref >60)
GLUCOSE: 172 mg/dL — AB (ref 65–99)
POTASSIUM: 3.9 mmol/L (ref 3.5–5.1)
Sodium: 134 mmol/L — ABNORMAL LOW (ref 135–145)

## 2017-06-28 LAB — CBC
HEMATOCRIT: 31 % — AB (ref 39.0–52.0)
Hemoglobin: 10.3 g/dL — ABNORMAL LOW (ref 13.0–17.0)
MCH: 27.9 pg (ref 26.0–34.0)
MCHC: 33.2 g/dL (ref 30.0–36.0)
MCV: 84 fL (ref 78.0–100.0)
Platelets: 157 10*3/uL (ref 150–400)
RBC: 3.69 MIL/uL — ABNORMAL LOW (ref 4.22–5.81)
RDW: 15.2 % (ref 11.5–15.5)
WBC: 13 10*3/uL — AB (ref 4.0–10.5)

## 2017-06-28 SURGERY — IRRIGATION AND DEBRIDEMENT EXTREMITY
Anesthesia: General | Laterality: Right

## 2017-06-28 MED ORDER — FENTANYL CITRATE (PF) 100 MCG/2ML IJ SOLN
INTRAMUSCULAR | Status: DC | PRN
  Administered 2017-06-28 (×2): 50 ug via INTRAVENOUS

## 2017-06-28 MED ORDER — LACTATED RINGERS IV SOLN: INTRAVENOUS | Status: DC

## 2017-06-28 MED ORDER — MIDAZOLAM HCL 5 MG/5ML IJ SOLN
INTRAMUSCULAR | Status: DC | PRN
  Administered 2017-06-28: 1 mg via INTRAVENOUS

## 2017-06-28 MED ORDER — MIDAZOLAM HCL 2 MG/2ML IJ SOLN
INTRAMUSCULAR | Status: AC
  Filled 2017-06-28: qty 2

## 2017-06-28 MED ORDER — ONDANSETRON HCL 4 MG/2ML IJ SOLN
INTRAMUSCULAR | Status: DC | PRN
  Administered 2017-06-28: 4 mg via INTRAVENOUS

## 2017-06-28 MED ORDER — PROPOFOL 10 MG/ML IV BOLUS
INTRAVENOUS | Status: DC | PRN
  Administered 2017-06-28: 120 mg via INTRAVENOUS

## 2017-06-28 MED ORDER — PROPOFOL 10 MG/ML IV BOLUS
INTRAVENOUS | Status: AC
  Filled 2017-06-28: qty 20

## 2017-06-28 MED ORDER — BACLOFEN 10 MG PO TABS
10.0000 mg | ORAL_TABLET | Freq: Three times a day (TID) | ORAL | Status: DC
  Administered 2017-06-28 – 2017-07-02 (×11): 10 mg via ORAL
  Filled 2017-06-28 (×11): qty 1

## 2017-06-28 MED ORDER — FENTANYL CITRATE (PF) 250 MCG/5ML IJ SOLN
INTRAMUSCULAR | Status: AC
  Filled 2017-06-28: qty 5

## 2017-06-28 MED ORDER — PIPERACILLIN-TAZOBACTAM 3.375 G IVPB
3.3750 g | Freq: Three times a day (TID) | INTRAVENOUS | Status: DC
  Administered 2017-06-29 (×2): 3.375 g via INTRAVENOUS
  Filled 2017-06-28 (×3): qty 50

## 2017-06-28 MED ORDER — LACTATED RINGERS IV SOLN
INTRAVENOUS | Status: DC | PRN
  Administered 2017-06-28: 16:00:00 via INTRAVENOUS

## 2017-06-28 MED ORDER — EPHEDRINE 5 MG/ML INJ
INTRAVENOUS | Status: AC
  Filled 2017-06-28: qty 10

## 2017-06-28 MED ORDER — SODIUM CHLORIDE 0.9 % IV SOLN
INTRAVENOUS | Status: DC
  Administered 2017-06-28: 16:00:00 via INTRAVENOUS

## 2017-06-28 MED ORDER — MINERAL OIL LIGHT 100 % EX OIL
TOPICAL_OIL | CUTANEOUS | Status: AC
  Filled 2017-06-28: qty 25

## 2017-06-28 MED ORDER — PHENYLEPHRINE HCL 10 MG/ML IJ SOLN
INTRAMUSCULAR | Status: DC | PRN
  Administered 2017-06-28: 100 ug/min via INTRAVENOUS

## 2017-06-28 MED ORDER — BUPIVACAINE HCL (PF) 0.25 % IJ SOLN
INTRAMUSCULAR | Status: AC
  Filled 2017-06-28: qty 30

## 2017-06-28 MED ORDER — LIDOCAINE 2% (20 MG/ML) 5 ML SYRINGE
INTRAMUSCULAR | Status: AC
  Filled 2017-06-28: qty 5

## 2017-06-28 MED ORDER — SODIUM CHLORIDE 0.9 % IR SOLN
Status: DC | PRN
  Administered 2017-06-28 (×2): 3000 mL

## 2017-06-28 MED ORDER — LIDOCAINE 2% (20 MG/ML) 5 ML SYRINGE
INTRAMUSCULAR | Status: DC | PRN
  Administered 2017-06-28: 100 mg via INTRAVENOUS

## 2017-06-28 MED ORDER — ONDANSETRON HCL 4 MG/2ML IJ SOLN
INTRAMUSCULAR | Status: AC
  Filled 2017-06-28: qty 2

## 2017-06-28 MED ORDER — EPHEDRINE SULFATE 50 MG/ML IJ SOLN
INTRAMUSCULAR | Status: DC | PRN
  Administered 2017-06-28 (×2): 15 mg via INTRAVENOUS

## 2017-06-28 SURGICAL SUPPLY — 40 items
BANDAGE ACE 4X5 VEL STRL LF (GAUZE/BANDAGES/DRESSINGS) ×7 IMPLANT
BNDG CONFORM 2 STRL LF (GAUZE/BANDAGES/DRESSINGS) IMPLANT
BNDG GAUZE ELAST 4 BULKY (GAUZE/BANDAGES/DRESSINGS) ×9 IMPLANT
CORDS BIPOLAR (ELECTRODE) ×3 IMPLANT
COVER SURGICAL LIGHT HANDLE (MISCELLANEOUS) ×3 IMPLANT
CUFF TOURNIQUET SINGLE 18IN (TOURNIQUET CUFF) ×3 IMPLANT
CUFF TOURNIQUET SINGLE 24IN (TOURNIQUET CUFF) IMPLANT
DRSG ADAPTIC 3X8 NADH LF (GAUZE/BANDAGES/DRESSINGS) ×13 IMPLANT
DRSG MEPITEL 4X7.2 (GAUZE/BANDAGES/DRESSINGS) ×2 IMPLANT
GAUZE SPONGE 4X4 12PLY STRL (GAUZE/BANDAGES/DRESSINGS) ×3 IMPLANT
GAUZE XEROFORM 1X8 LF (GAUZE/BANDAGES/DRESSINGS) ×3 IMPLANT
GLOVE BIOGEL M 8.0 STRL (GLOVE) ×3 IMPLANT
GLOVE SS BIOGEL STRL SZ 8 (GLOVE) ×1 IMPLANT
GLOVE SUPERSENSE BIOGEL SZ 8 (GLOVE) ×2
GOWN STRL REUS W/ TWL LRG LVL3 (GOWN DISPOSABLE) ×1 IMPLANT
GOWN STRL REUS W/ TWL XL LVL3 (GOWN DISPOSABLE) ×2 IMPLANT
GOWN STRL REUS W/TWL LRG LVL3 (GOWN DISPOSABLE) ×3
GOWN STRL REUS W/TWL XL LVL3 (GOWN DISPOSABLE) ×6
KIT BASIN OR (CUSTOM PROCEDURE TRAY) ×3 IMPLANT
KIT TURNOVER KIT B (KITS) ×3 IMPLANT
MANIFOLD NEPTUNE II (INSTRUMENTS) ×3 IMPLANT
NDL HYPO 25GX1X1/2 BEV (NEEDLE) IMPLANT
NEEDLE HYPO 25GX1X1/2 BEV (NEEDLE) IMPLANT
NS IRRIG 1000ML POUR BTL (IV SOLUTION) ×3 IMPLANT
PACK ORTHO EXTREMITY (CUSTOM PROCEDURE TRAY) ×3 IMPLANT
PAD ARMBOARD 7.5X6 YLW CONV (MISCELLANEOUS) ×3 IMPLANT
PAD CAST 4YDX4 CTTN HI CHSV (CAST SUPPLIES) ×1 IMPLANT
PADDING CAST COTTON 4X4 STRL (CAST SUPPLIES) ×6
PADDING CAST COTTON 6X4 STRL (CAST SUPPLIES) ×2 IMPLANT
SCRUB BETADINE 4OZ XXX (MISCELLANEOUS) ×3 IMPLANT
SOL PREP POV-IOD 4OZ 10% (MISCELLANEOUS) ×3 IMPLANT
SPONGE LAP 4X18 X RAY DECT (DISPOSABLE) ×3 IMPLANT
SWAB CULTURE ESWAB REG 1ML (MISCELLANEOUS) IMPLANT
SYR CONTROL 10ML LL (SYRINGE) IMPLANT
TOWEL OR 17X24 6PK STRL BLUE (TOWEL DISPOSABLE) ×3 IMPLANT
TOWEL OR 17X26 10 PK STRL BLUE (TOWEL DISPOSABLE) ×3 IMPLANT
TUBE CONNECTING 12'X1/4 (SUCTIONS) ×1
TUBE CONNECTING 12X1/4 (SUCTIONS) ×2 IMPLANT
WATER STERILE IRR 1000ML POUR (IV SOLUTION) ×3 IMPLANT
YANKAUER SUCT BULB TIP NO VENT (SUCTIONS) ×3 IMPLANT

## 2017-06-28 NOTE — Progress Notes (Signed)
Inpatient Diabetes Program Recommendations  AACE/ADA: New Consensus Statement on Inpatient Glycemic Control (2015)  Target Ranges:  Prepandial:   less than 140 mg/dL      Peak postprandial:   less than 180 mg/dL (1-2 hours)      Critically ill patients:  140 - 180 mg/dL   Lab Results  Component Value Date   GLUCAP 151 (H) 06/28/2017   HGBA1C 7.8 01/18/2016    Review of Glycemic Control Results for Matthew [name], Matthew [name] (MRN [id number]) as of 06/28/2017 12:39  Ref. Range 06/27/2017 11:32 06/27/2017 16:53 06/27/2017 21:13  Glucose-Capillary Latest Ref Range: 65 - 99 mg/dL 223 (H) 270 (H) 180 (H)   Diabetes history: Type 1 DM Outpatient Diabetes medications: Novolin 70/30 20 units BID Current orders for Inpatient glycemic control: Novolog 70/30 20 units BID, Novolog 0-15 units TID, Novolog 0-5 units QHS  Inpatient Diabetes Program Recommendations:    Noted patient is NPO for additional surgical procedure and received AM dose of Novolin 70/30. The order of 70/30 insulin is to be given only with meals.  Spoke with RN to verify dose was given and to watch for lows. Encouraged PM dose to be given with meal.  No additional recs at this time.  Thanks, Bronson Curb, MSN, RNC-OB Diabetes Coordinator [phone] (8a-5p)

## 2017-06-28 NOTE — Anesthesia Preprocedure Evaluation (Addendum)
Anesthesia Evaluation  Patient identified by MRN, date of birth, ID band Patient awake    Reviewed: Allergy & Precautions, NPO status , Patient's Chart, lab work & pertinent test results, reviewed documented beta blocker date and time   History of Anesthesia Complications Negative for: history of anesthetic complications  Airway Mallampati: II  TM Distance: >3 FB Neck ROM: Full    Dental  (+) Caps, Missing, Dental Advisory Given   Pulmonary sleep apnea (does not use CPAP) , former smoker,    Pulmonary exam normal breath sounds clear to auscultation       Cardiovascular hypertension, Pt. on medications and Pt. on home beta blockers (-) angina+ CAD, + CABG and +CHF (ischemic cardiomyopathy)  Normal cardiovascular exam+ dysrhythmias (h/o VF arrest) Ventricular Fibrillation + pacemaker [facility] dual chamber) + Cardiac Defibrillator  Rhythm:Regular Rate:Tachycardia  '11 ECHO: Inferior, septal, apical hypokinesis, EF 35%. '12 Myoview: low risk   Neuro/Psych  Headaches, Anxiety    GI/Hepatic negative GI ROS, Neg liver ROS,   Endo/Other  diabetes, Insulin DependentMorbid obesity  Renal/GU Renal InsufficiencyRenal disease     Musculoskeletal   Abdominal (+) + obese,   Peds  Hematology  (+) Blood dyscrasia (Hb 10.8, plt 126), ,   Anesthesia Other Findings   Reproductive/Obstetrics                           Anesthesia Physical  Anesthesia Plan  ASA: III  Anesthesia Plan: General   Post-op Pain Management:    Induction: Intravenous  PONV Risk Score and Plan: 3 and Ondansetron, Treatment may vary due to age or medical condition and Dexamethasone  Airway Management Planned: LMA  Additional Equipment:   Intra-op Plan:   Post-operative Plan: Extubation in OR  Informed Consent: I have reviewed the patients History and Physical, chart, labs and discussed the procedure including the risks,  benefits and alternatives for the proposed anesthesia with the patient or authorized representative who has indicated his/her understanding and acceptance.   Dental advisory given  Plan Discussed with: Anesthesiologist and CRNA  Anesthesia Plan Comments:        Anesthesia Quick Evaluation

## 2017-06-28 NOTE — Consult Note (Signed)
  Patient and I discussed all issues.  Patient is here for repair reconstruction and repeat washout.  We will look at his fascia as well as musculotendinous structures and neurovascular structures at the time of this operation to see if he is a candidate for closure at this point.  He had significant fasciitis and significant part rule months of the soft tissues of course is the op note notes  All questions have been addressed  We are planning surgery for your upper extremity. The risk and benefits of surgery to include risk of bleeding, infection, anesthesia,  damage to normal structures and failure of the surgery to accomplish its intended goals of relieving symptoms and restoring function have been discussed in detail. With this in mind we plan to proceed. I have specifically discussed with the patient the pre-and postoperative regime and the dos and don'ts and risk and benefits in great detail. Risk and benefits of surgery also include risk of dystrophy(CRPS), chronic nerve pain, failure of the healing process to go onto completion and other inherent risks of surgery The relavent the pathophysiology of the disease/injury process, as well as the alternatives for treatment and postoperative course of action has been discussed in great detail with the patient who desires to proceed.  We will do everything in our power to help you (the patient) restore function to the upper extremity. It is a pleasure to see this patient today.   He is alert oriented and understands the plan of care  Seletha Zimmermann MD

## 2017-06-28 NOTE — Transfer of Care (Signed)
Immediate Anesthesia Transfer of Care Note  Patient: Matai Carpenito  Procedure(s) Performed: Repeat irrigation and debridement right forearm (Right )  Patient Location: PACU  Anesthesia Type:General  Level of Consciousness: awake, alert , oriented and patient cooperative  Airway & Oxygen Therapy: Patient Spontanous Breathing and Patient connected to nasal cannula oxygen  Post-op Assessment: Report given to RN and Post -op Vital signs reviewed and stable  Post vital signs: Reviewed and stable  Last Vitals:  Vitals Value Taken Time  BP    Temp    Pulse    Resp    SpO2      Last Pain:  Vitals:   06/28/17 1800  TempSrc: Oral  PainSc:       Patients Stated Pain Goal: 3 (93/90/30 0923)  Complications: No apparent anesthesia complications

## 2017-06-28 NOTE — Progress Notes (Signed)
Patient came from OR dressing to right arm clean dry and intact, around 2030 patient had arm out of blue foam block and dressing saturated with blood. MD  On call notified and was instructed to remove ace wrap and reinforce dressings. Dressings removed and reapplied will continue to monitor for pain.  Capone Schwinn, Tivis Ringer, RN

## 2017-06-28 NOTE — Anesthesia Postprocedure Evaluation (Signed)
Anesthesia Post Note  Patient: Matthew [name]  Procedure(s) Performed: Repeat irrigation and debridement right forearm (Right )     Patient location during evaluation: PACU Anesthesia Type: General Level of consciousness: sedated Pain management: pain level controlled Vital Signs Assessment: post-procedure vital signs reviewed and stable Respiratory status: spontaneous breathing and respiratory function stable Cardiovascular status: stable Postop Assessment: no apparent nausea or vomiting Anesthetic complications: no    Last Vitals:  Vitals:   06/28/17 1749 06/28/17 1800  BP: 120/71 116/72  Pulse: 77 71  Resp: 15 12  Temp: (!) 36.3 C 36.5 C  SpO2: 98% 100%    Last Pain:  Vitals:   06/28/17 1800  TempSrc: Oral  PainSc:                  [place]

## 2017-06-28 NOTE — Progress Notes (Signed)
PROGRESS NOTE    Matthew [name]  ALP:[id number] DOB: [birth year] DOA: 06/25/2017  PCP: [name], MD   Brief Narrative:  52 y.o. male with medical history significant for hypertension, diabetes, chronic kidney disease stage IV, chronic systolic congestive heart failure, history of nonsustained VT status post ICD, coronary artery disease status post CABG. Pt presented with 1 week of erythema of right wrist along with decreased grip and weakness. He went to urgent care and was given an injection of antibiotics and started on a oral course of doxycycline. Cellulitis progressively worsened.    Assessment & Plan:   Principal Problem:   Cellulitis, right forearm / Leukocytosis  - S/P nerve release fasciectomy and tenotomy of the FCU tendon. Per ortho, pt fascia was necrotic and he had massive amounts of thick purulent fluid and required an extensive debridement. The wound was left open. Pt will require another surgery, per ortho schedule  - Continue vanco and zosyn - Cultures pending - Ortho following   Active Problems:   HYPERTENSION, BENIGN - Continue metoprolol     Chronic systolic heart failure ([place]) - Compensated     Anxiety and depression - Continue Prozac, Buspar    Dyslipidemia - Continue Crestor     CKD stage 4 due to type 2 diabetes mellitus ([place]) - Follow up daily BMP - Cr in 03/2017 as high as 3.09 - Cr on admission 4.54 - Pt on vanco, renally dosed - Will follow up on renal fxn in am and if not better we will consult renal   DVT prophylaxis: SCD's Code Status: full code  Family Communication: no family at the bedside Disposition Plan: needs another surgery for right arm cellulitis    Consultants:   Ortho  Procedures:  I&D 4/23  Antimicrobials:   Vanco and zosyn 4/22 -->   Subjective: No overnight events.  Objective: Vitals:   06/27/17 0328 06/27/17 1416 06/27/17 1944 06/28/17 0500  BP: 119/71 (!) 128/31 (!) 136/51 (!) 132/45    Pulse: 70 85 88 91  Resp: 16     Temp: 98.1 F (36.7 C) 97.6 F (36.4 C) 97.7 F (36.5 C) 98 F (36.7 C)  TempSrc: Oral Oral Oral Oral  SpO2: 97% 97% 98% 99%  Weight:      Height:        Intake/Output Summary (Last 24 hours) at 06/28/2017 0908 Last data filed at 06/28/2017 0538 Gross per 24 hour  Intake 290 ml  Output 700 ml  Net -410 ml   Filed Weights   06/25/17 2049  Weight: 115.7 kg (255 lb)    Physical Exam  Constitutional: Appears well-developed and well-nourished. No distress.  HENT: Normocephalic. External right and left ear normal. Oropharynx is clear and moist.  Eyes: Conjunctivae and EOM are normal. PERRLA, no scleral icterus.  Neck: Normal ROM. Neck supple. No JVD. No tracheal deviation. No thyromegaly.  CVS: RRR, S1/S2 + Pulmonary: Effort and breath sounds normal, no stridor, rhonchi, wheezes, rales.  Abdominal: Soft. BS +,  no distension, tenderness, rebound or guarding.  Musculoskeletal: Normal range of motion. No edema and no tenderness.  Lymphadenopathy: No lymphadenopathy noted, cervical, inguinal. Neuro: Alert. Normal reflexes, muscle tone coordination. No cranial nerve deficit. Skin: right forearm dressing in place  Psychiatric: Normal mood and affect. Behavior, judgment, thought content normal.     Data Reviewed: I have personally reviewed following labs and imaging studies  CBC: Recent Labs  Lab 06/25/17 2104 06/26/17 1003 06/27/17 0558 06/28/17 0611  WBC 16.5*  14.3* 13.8* 13.0*  NEUTROABS 14.1*  --   --   --   HGB 11.6* 10.8* 10.7* 10.3*  HCT 34.2* 31.3* 30.8* 31.0*  MCV 82.6 82.6 83.5 84.0  PLT 134* 126* 131* 010   Basic Metabolic Panel: Recent Labs  Lab 06/25/17 2104 06/26/17 1003 06/27/17 0558 06/28/17 0611  NA 128*  --  130* 134*  K 3.7  --  3.7 3.9  CL 98*  --  102 105  CO2 15*  --  13* 16*  GLUCOSE 158*  --  160* 172*  BUN 66*  --  70* 70*  CREATININE 4.54* 4.53* 4.39* 4.59*  CALCIUM 9.6  --  8.9 8.9    GFR: Estimated Creatinine Clearance: 23.4 mL/min (A) (by C-G formula based on SCr of 4.59 mg/dL (H)). Liver Function Tests: No results for input(s): AST, ALT, ALKPHOS, BILITOT, PROT, ALBUMIN in the last 168 hours. No results for input(s): LIPASE, AMYLASE in the last 168 hours. No results for input(s): AMMONIA in the last 168 hours. Coagulation Profile: No results for input(s): INR, PROTIME in the last 168 hours. Cardiac Enzymes: No results for input(s): CKTOTAL, CKMB, CKMBINDEX, TROPONINI in the last 168 hours. BNP (last 3 results) No results for input(s): PROBNP in the last 8760 hours. HbA1C: No results for input(s): HGBA1C in the last 72 hours. CBG: Recent Labs  Lab 06/27/17 0603 06/27/17 1132 06/27/17 1653 06/27/17 2113 06/28/17 0635  GLUCAP 160* 223* 270* 180* 167*   Lipid Profile: No results for input(s): CHOL, HDL, LDLCALC, TRIG, CHOLHDL, LDLDIRECT in the last 72 hours. Thyroid Function Tests: No results for input(s): TSH, T4TOTAL, FREET4, T3FREE, THYROIDAB in the last 72 hours. Anemia Panel: No results for input(s): VITAMINB12, FOLATE, FERRITIN, TIBC, IRON, RETICCTPCT in the last 72 hours. Urine analysis:    Component Value Date/Time   COLORURINE YELLOW 06/25/2017 2110   APPEARANCEUR HAZY (A) 06/25/2017 2110   LABSPEC 1.005 06/25/2017 2110   PHURINE 5.0 06/25/2017 2110   GLUCOSEU 50 (A) 06/25/2017 2110   HGBUR MODERATE (A) 06/25/2017 2110   BILIRUBINUR NEGATIVE 06/25/2017 2110   KETONESUR NEGATIVE 06/25/2017 2110   PROTEINUR 100 (A) 06/25/2017 2110   UROBILINOGEN 1.0 08/31/2009 1526   NITRITE NEGATIVE 06/25/2017 2110   LEUKOCYTESUR NEGATIVE 06/25/2017 2110   Sepsis Labs: @LABRCNTIP (procalcitonin:4,lacticidven:4)   ) Recent Results (from the past 240 hour(s))  Culture, blood (routine x 2)     Status: None (Preliminary result)   Collection Time: 06/25/17  9:04 PM  Result Value Ref Range Status   Specimen Description BLOOD RIGHT ANTECUBITAL  Final    Special Requests   Final    BOTTLES DRAWN AEROBIC AND ANAEROBIC Blood Culture adequate volume   Culture   Final    NO GROWTH 2 DAYS Performed at [facility], [place] [address], [place], [place] [address]    Report Status PENDING  Incomplete  Aerobic/Anaerobic Culture (surgical/deep wound)     Status: None (Preliminary result)   Collection Time: 06/26/17  8:35 PM  Result Value Ref Range Status   Specimen Description ABSCESS RIGHT FOREARM  Final   Special Requests PATIENT ON FOLLOWING ZOSYN AND VANC  Final   Gram Stain   Final    ABUNDANT WBC PRESENT, PREDOMINANTLY PMN FEW GRAM POSITIVE COCCI    Culture   Final    TOO YOUNG TO READ Performed at [facility], [place] [address] [place],  [address]    Report Status PENDING  Incomplete  Surgical pcr  screen     Status: Abnormal   Collection Time: 06/27/17 12:59 PM  Result Value Ref Range Status   MRSA, PCR POSITIVE (A) NEGATIVE Final    Comment: RESULT CALLED TO, READ BACK BY AND VERIFIED WITH: Burna Mortimer RN 15:40 06/27/17 ([email or web address])    Staphylococcus aureus POSITIVE (A) NEGATIVE Final    Comment: (NOTE) The Xpert SA Assay (FDA approved for NASAL specimens in patients 38 years of age and older), is one component of a comprehensive surveillance program. It is not intended to diagnose infection nor to guide or monitor treatment. Performed at [facility], [place] [address], [place], [place] [address]       Radiology Studies: Ct Forearm Right Wo Contrast  Result Date: 06/26/2017 CLINICAL DATA:  Cellulitis of the volar mid forearm. EXAM: CT OF THE RIGHT FOREARM WITHOUT CONTRAST TECHNIQUE: Multidetector CT imaging was performed according to the standard protocol. Multiplanar CT image reconstructions were also generated. COMPARISON:  None. FINDINGS: Bones/Joint/Cartilage No acute fracture or dislocation. No osseous destruction. Joint spaces are preserved. Bone mineralization is normal. Ligaments Suboptimally assessed  by CT. Muscles and Tendons There are foci of soft tissue gas with prominent fluid along the distal flexor carpi ulnaris myotendinous junction. There is enlargement and hypodensity of the distal flexor carpi ulnaris muscle. Remaining flexor and extensor muscles and tendons are grossly unremarkable. Soft tissues Prominent skin thickening and subcutaneous edema along the volar forearm and wrist. Vascular calcifications. IMPRESSION: 1. Foci of soft tissue gas and prominent fluid along the distal flexor carpi ulnaris myotendinous junction with enlargement and hypodensity of the distal muscle, concerning for necrotizing fasciitis/myositis. 2. Prominent skin thickening and subcutaneous edema along the volar forearm and wrist, consistent with cellulitis given clinical history. Electronically Signed   By: Titus Dubin M.D.   On: 06/26/2017 14:39        Scheduled Meds: . aspirin EC  325 mg Oral Daily  . baclofen  20 mg Oral TID  . busPIRone  10 mg Oral BID  . FLUoxetine  20 mg Oral Daily  . fluticasone  2 spray Each Nare Daily  . insulin aspart  0-15 Units Subcutaneous TID WC  . insulin aspart  0-5 Units Subcutaneous QHS  . insulin aspart protamine- aspart  20 Units Subcutaneous BID WC  . metoprolol succinate  50 mg Oral Daily  . mupirocin ointment  1 application Nasal BID  . oxymetazoline  1 spray Each Nare BID  . rosuvastatin  40 mg Oral Daily  . senna-docusate  1 tablet Oral BID  . vitamin C  1,000 mg Oral Daily   Continuous Infusions: . methocarbamol (ROBAXIN)  IV    . piperacillin-tazobactam (ZOSYN)  IV Stopped (06/27/17 2117)  . [START ON 06/28/2017] vancomycin       LOS: 2 days    Time spent: 25 minutes Greater than 50% of the time spent on counseling and coordinating the care.   Leisa Lenz, MD [facility] Pager [phone]  If 7PM-7AM, please contact night-coverage [email or web address] Password [email or web address] 06/28/2017, 9:08 AM

## 2017-06-28 NOTE — Op Note (Signed)
See [phone] Sp I&D and repair right forearm with tenosynovectomy Will plan for repeat I&D and repair Saturday Matthew Witte MD

## 2017-06-28 NOTE — Progress Notes (Signed)
Occupational Therapy Treatment Patient Details Name: Matthew [name] MRN: [id number] DOB: 1965-10-24 Today's Date: 06/28/2017    History of present illness 52 y.o. male with medical history significant of hypertension, diabetes, chronic kidney disease stage IV, chronic systolic congestive heart failure, history of nonsustained VT status post ICD, coronary artery disease status post CABG. Pt presented on 4/22 and found to have Right forearm erythema/cellulitis and worsening infection pattern with CT evidence of soft tissue, air, and abscess formation along the flexor carpi ulnaris. Pt is now s/p nerve release fasciectomy and tenotomy of the FCU tendon. Planning for repeat I&D on 4/25   OT comments  Pt presents sleeping in recliner, sleeping but able to arouse and agreeable to tx session. Pt demonstrating room level functional mobility at RW level this session with overall MinGuard assist and verbal cues for safe RW use. Continued review of edema management and A/AAROM to RUE. Pt completing toileting ADLs with MinGuard, seated grooming ADLs with MinA. Began education regarding compensatory techniques for ADL completion given RUE functional limitations. Will continue to review in next treatment sessions and to follow for progression of RUE; pt going for additional RUE procedure this afternoon.    Follow Up Recommendations  Follow surgeon's recommendation for DC plan and follow-up therapies;Supervision - Intermittent    Equipment Recommendations  3 in 1 bedside commode          Precautions / Restrictions Precautions Precautions: Fall Restrictions Weight Bearing Restrictions: No Other Position/Activity Restrictions: no formal order for RUE wt bearing at this time; elevate UE       Mobility Bed Mobility Overal bed mobility: Needs Assistance Bed Mobility: Sit to Supine       Sit to supine: Min guard   General bed mobility comments: MinGuard for safety   Transfers Overall transfer  level: Needs assistance Equipment used: Rolling walker (2 wheeled) Transfers: Sit to/from Stand Sit to Stand: Min guard         General transfer comment: MinGuard for safety; pt needs consistent cues for safe UE placement when completing sit<>stand     Balance Overall balance assessment: Needs assistance Sitting-balance support: Feet supported Sitting balance-Leahy Scale: Good     Standing balance support: Bilateral upper extremity supported;No upper extremity supported Standing balance-Leahy Scale: Fair                             ADL either performed or assessed with clinical judgement   ADL Overall ADL's : Needs assistance/impaired     Grooming: Set up;Sitting;Minimal assistance;Wash/dry Nurse, mental health Details (indicate cue type and reason): light MinA due to RUE limitations          Upper Body Dressing : Minimal assistance;Sitting Upper Body Dressing Details (indicate cue type and reason): donning new gown  Lower Body Dressing: Minimal assistance;Sit to/from stand   Toilet Transfer: Min guard;Ambulation;Regular Toilet;RW Toilet Transfer Details (indicate cue type and reason): verbal cues for hand placement during transfer  [place] and Hygiene: Min guard;Sit to/from stand;Sitting/lateral lean Toileting - Clothing Manipulation Details (indicate cue type and reason): pt performing lateral leans while seated for peri-care, minguard for safety during gown management      Functional mobility during ADLs: Min guard;Rolling walker General ADL Comments: upon entering room pt asleep in recliner, noted IV to be dislodged from pt's hand and bleeding onto gown and floor requiring assist from therapist and RN to address; pt completing room level functional mobility, use of RW  for stability with pt placing most of wt through LUE; began education regarding ADL completion with single UE - will benefit from continued review                         Cognition Arousal/Alertness: Awake/alert Behavior During Therapy: WFL for tasks assessed/performed Overall Cognitive Status: Within Functional Limits for tasks assessed                                 General Comments: pt seems slow to process at times, but appears to be at his baseline regarding cognitive status, demonstrates good safety awareness (reports need to currently use RW vs cane as he recognizes he is slightly more unsteady and typically uses cane in RUE which he is currently not able to do)        Exercises General Exercises - Upper Extremity Shoulder Flexion: AROM;10 reps;Right;Seated Shoulder ABduction: AROM;10 reps;Right;Seated Shoulder ADduction: AROM;10 reps;Right;Seated Hand Exercises Digit Composite Flexion: AROM;AAROM;10 reps;Right;Seated Composite Extension: AROM;AAROM;Right;Seated Other Exercises Other Exercises: instructed pt in modified tendon gliding exercises x10 reps each motion                Pertinent Vitals/ Pain       Pain Assessment: Faces Faces Pain Scale: Hurts little more Pain Location: R hand with ROM  Pain Descriptors / Indicators: Guarding Pain Intervention(s): Monitored during session  Home Living Family/patient expects to be discharged to:: Private residence   Available Help at Discharge: Family;Friend(s) Type of Home: House Home Access: Stairs to enter Technical brewer of Steps: 2-3 Entrance Stairs-Rails: Left;Right;Can reach both Home Layout: Two level Alternate Level Stairs-Number of Steps: has chair lift that can transport to second level if needed; flight of stairs Alternate Level Stairs-Rails: Right Bathroom Shower/Tub: Walk-in shower;Tub/shower unit   Bathroom Toilet: Standard     Home Equipment: Cane - single point;Walker - 2 wheels;Shower seat;Bedside commode                        Frequency  Min 3X/week        Progress Toward Goals  OT Goals(current goals can now be found  in the care plan section)  Progress towards OT goals: Progressing toward goals  Acute Rehab OT Goals Patient Stated Goal: maintain independence  OT Goal Formulation: With patient Time For Goal Achievement: 07/11/17 Potential to Achieve Goals: Good  Plan Discharge plan remains appropriate                     AM-PAC PT "6 Clicks" Daily Activity     Outcome Measure   Help from another person eating meals?: A Little Help from another person taking care of personal grooming?: A Little Help from another person toileting, which includes using toliet, bedpan, or urinal?: A Little Help from another person bathing (including washing, rinsing, drying)?: A Little Help from another person to put on and taking off regular upper body clothing?: A Little Help from another person to put on and taking off regular lower body clothing?: A Little 6 Click Score: 18    End of Session Equipment Utilized During Treatment: Rolling walker;Gait belt  OT Visit Diagnosis: Muscle weakness (generalized) (M62.81);Pain Pain - Right/Left: Right Pain - part of body: Arm;Hand   Activity Tolerance Patient tolerated treatment well   Patient Left in bed;with call bell/phone within reach   Nurse Communication Mobility  status        Time: 1110-1146 OT Time Calculation (min): 36 min  Charges: OT General Charges $OT Visit: 1 Visit OT Treatments $Self Care/Home Management : 8-22 mins $Therapeutic Activity: 8-22 mins  Lou Cal, [place] Pager [phone] 06/28/2017   Raymondo Band 06/28/2017, 2:07 PM

## 2017-06-28 NOTE — Progress Notes (Signed)
Report called to short stay. Preop checklist filled out.

## 2017-06-28 NOTE — Op Note (Signed)
NAMEJADER, DESAI               ACCOUNT NO.:  [id number]  MEDICAL RECORD NO.:  [id number]  LOCATION:  [facility]                         FACILITY:  [facility]  PHYSICIAN:  Satira Anis. Hoyt Leanos, M.D.DATE OF BIRTH:  [birth year]  DATE OF PROCEDURE: DATE OF DISCHARGE:                              OPERATIVE REPORT   PREOPERATIVE DIAGNOSIS:  Necrotizing fasciitis, right forearm and wrist, status post initial debridement.  The patient presents for repeat debridement, tenolysis, tenosynovectomy.  POSTOPERATIVE DIAGNOSIS:  Necrotizing fasciitis, right forearm and wrist, status post initial debridement.  The patient presents for repeat debridement, tenolysis, tenosynovectomy.  PROCEDURE: 1. Radical tenolysis and tenosynovectomy of the FCR tendon as well as     the FDP and FDS as well as the FPL tendons. 2. Neurolysis, median nerve. 3. Neurolysis, ulnar nerve. 4. Excisional debridement of skin, subcutaneous tissue, tendon,     muscle, and associated necrotic soft tissue about the right     forearm.  SURGEON:  Satira Anis. Amedeo Plenty, M.D.  ASSISTANT:  None.  COMPLICATIONS:  None.  ANESTHESIA:  General.  TOURNIQUET TIME:  Zero.  INDICATIONS:  A 52 year old with necrotizing fasciitis.  He presents for repeat I and D, evaluation, and treatment.  OPERATIVE PROCEDURE:  The patient was seen by myself and Anesthesia, taken to the operative theater, and underwent smooth induction of general anesthesia.  Laid supine, given a Hibiclens scrub x2 followed by Betadine scrub x1 ten-minute scrub.  This was performed by myself. Sterile field secured.  Outline marks not necessary as the wound was opened.  I opened the wound up.  Conditions were favorable.  There was no purulence or reaccumulation of abscess material.  I then performed tenolysis, tenosynovectomy of the FCR tendon.  Following this, the FPL as well as the FDP to the index through small finger and FDP muscle belly as well as the FDS and FDS muscle  belly. Underwent tenolysis and tenosynovectomy, extensive in nature.  The FCU was previously resected.  At this time, I performed a median nerve neurolysis and removed inflammatory peel off it.  We also performed the same type endeavor for the ulnar nerve and ulnar artery.  I then washed him out with 6 L of saline and performed irrigation and debridement of skin, subcutaneous tissue, muscle, and tendon structures with curette, scissor, and knife blade.  The patient tolerated this well.  Following this, 3 Penrose drains were placed and a standard dressing of Mepitel followed by gauze, Kerlix, Webril, ABDs, and a splint were applied.  I left the wound open.  I will return in 48 hours for repeat I and D and possible closure pending intraoperative conditions.  All questions have been encouraged and answered.  Do's and don'ts have been discussed.  Should any problems arise, I will be immediately available.  We will continue an aggressive course of care for this gentleman in hopes to eradicate his infection.     Satira Anis. Amedeo Plenty, M.D.     [facility]  D:  06/28/2017  T:  06/28/2017  Job:  [id number]

## 2017-06-29 ENCOUNTER — Encounter (HOSPITAL_COMMUNITY): Payer: Self-pay | Admitting: Orthopedic Surgery

## 2017-06-29 ENCOUNTER — Inpatient Hospital Stay (HOSPITAL_COMMUNITY): Payer: Medicare Other

## 2017-06-29 LAB — BASIC METABOLIC PANEL
Anion gap: 12 (ref 5–15)
BUN: 74 mg/dL — AB (ref 6–20)
CHLORIDE: 106 mmol/L (ref 101–111)
CO2: 15 mmol/L — AB (ref 22–32)
Calcium: 8.5 mg/dL — ABNORMAL LOW (ref 8.9–10.3)
Creatinine, Ser: 5.07 mg/dL — ABNORMAL HIGH (ref 0.61–1.24)
GFR calc Af Amer: 14 mL/min — ABNORMAL LOW (ref >60)
GFR calc non Af Amer: 12 mL/min — ABNORMAL LOW (ref >60)
GLUCOSE: 212 mg/dL — AB (ref 65–99)
POTASSIUM: 4 mmol/L (ref 3.5–5.1)
Sodium: 133 mmol/L — ABNORMAL LOW (ref 135–145)

## 2017-06-29 LAB — CBC
HEMATOCRIT: 26.8 % — AB (ref 39.0–52.0)
Hemoglobin: 8.6 g/dL — ABNORMAL LOW (ref 13.0–17.0)
MCH: 27.5 pg (ref 26.0–34.0)
MCHC: 32.1 g/dL (ref 30.0–36.0)
MCV: 85.6 fL (ref 78.0–100.0)
Platelets: 158 10*3/uL (ref 150–400)
RBC: 3.13 MIL/uL — AB (ref 4.22–5.81)
RDW: 15.5 % (ref 11.5–15.5)
WBC: 11.6 10*3/uL — ABNORMAL HIGH (ref 4.0–10.5)

## 2017-06-29 LAB — GLUCOSE, CAPILLARY
GLUCOSE-CAPILLARY: 193 mg/dL — AB (ref 65–99)
GLUCOSE-CAPILLARY: 334 mg/dL — AB (ref 65–99)
Glucose-Capillary: 241 mg/dL — ABNORMAL HIGH (ref 65–99)
Glucose-Capillary: 202 mg/dL — ABNORMAL HIGH (ref 65–99)

## 2017-06-29 MED ORDER — PIPERACILLIN-TAZOBACTAM 3.375 G IVPB
3.3750 g | Freq: Two times a day (BID) | INTRAVENOUS | Status: DC
  Administered 2017-06-29 – 2017-07-01 (×5): 3.375 g via INTRAVENOUS
  Filled 2017-06-29 (×6): qty 50

## 2017-06-29 MED ORDER — STERILE WATER FOR INJECTION IV SOLN
INTRAVENOUS | Status: DC
  Administered 2017-06-29: 19:00:00 via INTRAVENOUS
  Filled 2017-06-29 (×3): qty 9.71

## 2017-06-29 NOTE — Consult Note (Addendum)
[facility] Nephrology Consultation Note  Requesting MD: Dr. Rogers Blocker Reason for consult: AKI on CKD  HPI:  Matthew [name] is a 52 y.o. male.  Hypertension, diabetes, chronic kidney disease is stage IV follows at [facility], congestive heart failure, coronary artery disease status post CABG admitted with cellulitis of right forearm/necrotizing fasciitis status post surgery twice, consulted for the evaluation of acute worsening in renal failure.  Patient reported that he has CKD for about 5 years.  He is creatinine was 3.12 on 06/11/2017 at [facility].  During this admission, he had serum creatinine level of 4.54.  Patient went over twice and was receiving IV vancomycin for the infection.  No vancomycin level available to review.  The serum creatinine level worsened to 5 today.  Patient denied headache, dizziness, nausea vomiting chest pain shortness of breath.  Has no dysuria urgency or frequency.  He was on oral Lasix 40 daily at home.  Creat  Date/Time Value Ref Range Status  04/23/2017 11:26 AM 2.83 (H) 0.70 - 1.33 mg/dL Final    Comment:    For patients >18 years of age, the reference limit for Creatinine is approximately 13% higher for people identified as African-American. Marland Kitchen   03/30/2017 11:19 AM 3.09 (H) 0.70 - 1.33 mg/dL Final    Comment:    Verified by repeat analysis. . For patients >65 years of age, the reference limit for Creatinine is approximately 13% higher for people identified as African-American. .   09/20/2016 12:44 PM 2.41 (H) 0.70 - 1.33 mg/dL Final    Comment:      For patients > or = 54 years of age: The upper reference limit for Creatinine is approximately 13% higher for people identified as African-American.     10/01/2014 12:17 PM 1.68 (H) 0.70 - 1.33 mg/dL Final  10/02/2012 03:35 PM 1.37 (H) 0.50 - 1.35 mg/dL Final  05/11/2011 04:28 PM 1.51 (H) 0.50 - 1.35 mg/dL Final  11/18/2010 03:07 PM 1.45 (H) 0.50 - 1.35 mg/dL Final   Creatinine, Ser   Date/Time Value Ref Range Status  06/29/2017 06:57 AM 5.07 (H) 0.61 - 1.24 mg/dL Final  06/28/2017 06:11 AM 4.59 (H) 0.61 - 1.24 mg/dL Final  06/27/2017 05:58 AM 4.39 (H) 0.61 - 1.24 mg/dL Final  06/26/2017 10:03 AM 4.53 (H) 0.61 - 1.24 mg/dL Final  06/25/2017 09:04 PM 4.54 (H) 0.61 - 1.24 mg/dL Final  09/17/2012 02:43 AM 1.36 (H) 0.50 - 1.35 mg/dL Final  09/16/2012 12:10 PM 1.22 0.50 - 1.35 mg/dL Final  02/19/2012 04:19 PM 1.46 (H) 0.50 - 1.35 mg/dL Final  10/30/2011 01:13 PM 1.20 0.50 - 1.35 mg/dL Final  07/13/2011 12:21 PM 1.3 0.4 - 1.5 mg/dL Final  02/13/2011 02:52 PM 1.4 0.4 - 1.5 mg/dL Final  01/04/2011 02:41 PM 1.5 0.4 - 1.5 mg/dL Final  11/01/2010 12:52 PM 1.2 0.4 - 1.5 mg/dL Final  09/12/2010 03:32 PM 1.28 0.50 - 1.35 mg/dL Final    Comment:    **Please note change in reference range.**  11/25/2009 10:48 AM 1.2 0.4 - 1.5 mg/dL Final  09/23/2009 03:20 PM 1.4 0.4 - 1.5 mg/dL Final  09/09/2009 04:07 AM 1.53 (H) 0.4 - 1.5 mg/dL Final  09/08/2009 04:14 AM 1.61 (H) 0.4 - 1.5 mg/dL Final  09/07/2009 04:35 AM 1.87 (H) 0.4 - 1.5 mg/dL Final  09/06/2009 05:15 AM 2.11 (H) 0.4 - 1.5 mg/dL Final  09/05/2009 03:25 AM 2.49 (H) 0.4 - 1.5 mg/dL Final  09/04/2009 04:15 AM 2.43 (H) 0.4 - 1.5  mg/dL Final  09/03/2009 04:25 PM 2.40 (H) 0.4 - 1.5 mg/dL Final  09/03/2009 04:30 AM 1.92 (H) 0.4 - 1.5 mg/dL Final  09/02/2009 03:00 AM 1.67 (H) 0.4 - 1.5 mg/dL Final  09/01/2009 09:15 PM 1.5 0.4 - 1.5 mg/dL Final  09/01/2009 09:00 PM 1.52 (H) 0.4 - 1.5 mg/dL Final  09/01/2009 05:35 AM 1.65 (H) 0.4 - 1.5 mg/dL Final  08/31/2009 04:30 AM 1.39 0.4 - 1.5 mg/dL Final  08/30/2009 02:05 PM 1.37 0.4 - 1.5 mg/dL Final  08/30/2009 04:06 AM 1.16 0.4 - 1.5 mg/dL Final  08/29/2009 03:30 AM 1.27 0.4 - 1.5 mg/dL Final  08/28/2009 10:25 AM 1.36 0.4 - 1.5 mg/dL Final  08/28/2009 06:45 AM 1.11 0.4 - 1.5 mg/dL Final  08/28/2009 03:19 AM 1.19 0.4 - 1.5 mg/dL Final  08/28/2009 01:05 AM 1.01 0.4 - 1.5 mg/dL Final   08/27/2009 10:20 PM 0.95 0.4 - 1.5 mg/dL Final  08/27/2009 06:24 PM 0.93 0.4 - 1.5 mg/dL Final  08/27/2009 02:30 PM 0.95 0.4 - 1.5 mg/dL Final  08/27/2009 10:35 AM 0.91 0.4 - 1.5 mg/dL Final  08/27/2009 06:40 AM 1.03 0.4 - 1.5 mg/dL Final  08/27/2009 02:30 AM 0.94 0.4 - 1.5 mg/dL Final  08/26/2009 10:20 PM 0.91 0.4 - 1.5 mg/dL Final  08/26/2009 06:30 PM 0.96 0.4 - 1.5 mg/dL Final  08/26/2009 04:59 PM 0.89 0.4 - 1.5 mg/dL Final  08/26/2009 02:30 PM 1.04 0.4 - 1.5 mg/dL Final  08/26/2009 10:20 AM 1.23 0.4 - 1.5 mg/dL Final  08/26/2009 06:31 AM 1.0 0.4 - 1.5 mg/dL Final  08/25/2009 03:30 AM 1.11 0.4 - 1.5 mg/dL Final  08/24/2009 08:33 AM 1.05 0.4 - 1.5 mg/dL Final  08/23/2009 10:48 AM 1.11 0.4 - 1.5 mg/dL Final  05/08/2008 09:19 PM 0.98 0.40 - 1.50 mg/dL Final     PMHx:   Past Medical History:  Diagnosis Date  . Abnormality of gait 04/08/2013  . AICD (automatic cardioverter/defibrillator) present   . Anxiety    stress  . Anxiety and depression   . CAD (coronary artery disease)    a. OOH MI 6/11; presented with acute CHF; hosp course c/b VF arrest with VDRF, etc;   b s/p CABG: L-LAD/Dx, S-OM/dCFX;  c.Nuclear scan 8/12:  Inferior and inf-lat scar with minimal peri-infarct ischemia, EF 53%.; d.LHC 9/12:  LAD 90%, pD1 (small) 70%, prox large Dx 95%, L-LAD and Dx ok, CFX occluded, S-OM and CFX ok, RCA prox to prox/mid 60-70%.  Medical management     . Cardiac arrest - ventricular fibrillation 08/26/2009  . Carpal tunnel syndrome of right wrist   . Cellulitis 2012   spider bite  . Cellulitis 06/2017   right upper extremity  . Chronic systolic heart failure ([place])   . CKD (chronic kidney disease)   . Coma ([place])   . Diabetic coma with ketoacidosis ([place])    Occured in 1994 where he spent 30 days in a coma . He develpoed infection of his leg and developing pancreatitis during that  time   . DM2 (diabetes mellitus, type 2) ([place])   . Dysrhythmia   . Foot drop, bilateral 04/08/2013  .  Headache(784.0)    sinus  . History of degenerative disc disease   . HLD (hyperlipidemia)   . HTN (hypertension)   . Hypercholesteremia   . Ischemic cardiomyopathy    echo 10/11:  inf-septal and apical HK, EF 35%, mod LAE  . Neuropathy   . Nocturnal leg cramps   . Nocturnal leg cramps 04/02/2017  .  Obesity   . Pancreatitis 2008  . Periodic limb movement 04/02/2017  . 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 Presence of permanent cardiac pacemaker   . Retinal detachment    Bilateral, laser surgery on the right  . RLS (restless legs syndrome) 04/22/2015  . Thyroid disease   . 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 APPENDECTOMY  mid 1990  . CARDIAC DEFIBRILLATOR PLACEMENT    . CARDIAC SURGERY    . CARPAL TUNNEL WITH CUBITAL TUNNEL  02/22/2012   Procedure: CARPAL TUNNEL WITH CUBITAL TUNNEL;  Surgeon: Roseanne Kaufman, MD;  Location: [facility];  Service: Orthopedics;  Laterality: Right;  Right Carpal Tunnel Release/Right Cubital Tunnel Release and Transposition if Necessary with Flexor Pronator Release  . CATARACT EXTRACTION    . COLONOSCOPY WITH PROPOFOL N/A 11/17/2014   Procedure: COLONOSCOPY WITH PROPOFOL;  Surgeon: Manus Gunning, MD;  Location: [facility];  Service: Gastroenterology;  Laterality: N/A;  . COLONOSCOPY WITH PROPOFOL N/A 01/05/2015   Procedure: COLONOSCOPY WITH PROPOFOL;  Surgeon: Manus Gunning, MD;  Location: [facility];  Service: Gastroenterology;  Laterality: N/A;  . CORONARY ARTERY BYPASS GRAFT    . coronary artery bypass grafting x4  june 29,2011   x 4  . I&D EXTREMITY Right 06/26/2017   Procedure: IRRIGATION AND DEBRIDEMENT RIGHT FOREARM;  Surgeon: Roseanne Kaufman, MD;  Location: [facility];  Service: Orthopedics;  Laterality: Right;  . I&D EXTREMITY Right 06/28/2017   Procedure: Repeat irrigation and debridement right forearm;  Surgeon: Roseanne Kaufman, MD;  Location: [facility];  Service:  Orthopedics;  Laterality: Right;  . PACEMAKER PLACEMENT    . RETINAL DETACHMENT SURGERY Bilateral   . RETINAL DETACHMENT SURGERY Bilateral    3 months ago  . right sided abdominal cyst removal    . TONSILLECTOMY  1998  . ULNAR NERVE TRANSPOSITION Right     Family Hx:  Family History  Problem Relation Age of Onset  . Allergies Mother   . Diabetes Mother   . Hypertension Mother   . Hyperlipidemia Mother   . Dementia Mother   . Colon cancer Mother   . Lung cancer Mother   . Kidney cancer Mother   . Colon polyps Mother   . Stroke Father   . Allergies Father   . Dementia Father   . Alcohol abuse Father   . Colon polyps Father   . Diabetes Sister   . Heart disease Sister   . Anxiety disorder Sister   . Cancer Maternal Grandmother        unknown type  . Schizophrenia Paternal Uncle   . Schizophrenia Cousin     Social History:  reports that he has quit smoking. His smoking use included cigars. His smokeless tobacco use includes snuff. He reports that he does not drink alcohol or use drugs.  Allergies:  Allergies  Allergen Reactions  . Ace Inhibitors Cough  . Cephalexin Other (See Comments)    Resistant to antibiotic  . Beta Adrenergic Blockers Cough    Medications: Prior to Admission medications   Medication Sig Start Date End Date Taking? Authorizing Provider  acetaminophen (TYLENOL) 500 MG tablet Take 1,000 mg by mouth as needed.   Yes [provider]  aspirin 325 MG tablet Take 325 mg by mouth daily.     Yes [provider]  baclofen (LIORESAL) 20 MG tablet Take 1 tablet (20 mg total) by mouth 3 (three) times  daily. 04/02/17  Yes Kathrynn Ducking, MD  busPIRone (BUSPAR) 10 MG tablet Take 1 tablet (10 mg total) by mouth 2 (two) times daily. 04/11/17  Yes Cloria Spring, MD  FLUoxetine (PROZAC) 20 MG capsule Take 1 capsule (20 mg total) by mouth daily. 04/11/17 04/11/18 Yes Cloria Spring, MD  fluticasone [facility]) 50 MCG/ACT nasal spray Place 2 sprays  into both nostrils daily. 04/11/17  Yes [name], MD  furosemide (LASIX) 40 MG tablet Take 1 tablet (40 mg total) by mouth daily. 12/06/16  Yes Evans Lance, MD  insulin NPH-regular Human (NOVOLIN 70/30) (70-30) 100 UNIT/ML injection Inject 35 Units into the skin 2 (two) times daily with a meal.  09/26/16  Yes [provider]  Melatonin 10 MG TABS Take 10 mg by mouth at bedtime.   Yes [provider]  methylcellulose (ARTIFICIAL TEARS) 1 % ophthalmic solution Place 1 drop into both eyes 2 (two) times daily as needed (dry eyes).   Yes [provider]  metoprolol succinate (TOPROL-XL) 50 MG 24 hr tablet Take 1 tablet (50 mg total) by mouth daily. Please make overdue appt with Dr. Lovena Le before any more refills. 1st attempt 06/04/17  Yes Evans Lance, MD  nitroGLYCERIN (NITROSTAT) 0.4 MG SL tablet Place 1 tablet (0.4 mg total) under the tongue every 5 (five) minutes as needed for chest pain. 01/21/16 01/26/20 Yes Evans Lance, MD  Propylhexedrine [facility]) INHA Place 1 each into the nose 2 (two) times daily as needed (congestion).    Yes [provider]  rosuvastatin (CRESTOR) 40 MG tablet Take 1 tablet (40 mg total) by mouth daily. 10/13/16  Yes [name], MD  Insulin Glargine-Lixisenatide 100-33 UNT-MCG/ML SOPN Inject 40 Units into the skin daily. Willeen Niece 02/18/16   [provider]  Multiple Vitamin (MULTIVITAMIN) tablet Take 1 tablet by mouth daily.      [provider]  Omega-3 Fatty Acids (FISH OIL BURP-LESS PO) Take 1 tablet by mouth daily.      [provider]  pramipexole (MIRAPEX) 0.125 MG tablet Take 1 tablet (0.125 mg total) by mouth at bedtime. Patient not taking: Reported on 06/25/2017 04/02/17   Kathrynn Ducking, MD    I have reviewed the patient's current medications.  Labs:  Results for orders placed or performed during the hospital encounter of 06/25/17 (from the past 48 hour(s))  Glucose,  capillary     Status: Abnormal   Collection Time: 06/27/17  4:53 PM  Result Value Ref Range   Glucose-Capillary 270 (H) 65 - 99 mg/dL  Glucose, capillary     Status: Abnormal   Collection Time: 06/27/17  9:13 PM  Result Value Ref Range   Glucose-Capillary 180 (H) 65 - 99 mg/dL  CBC     Status: Abnormal   Collection Time: 06/28/17  6:11 AM  Result Value Ref Range   WBC 13.0 (H) 4.0 - 10.5 K/uL   RBC 3.69 (L) 4.22 - 5.81 MIL/uL   Hemoglobin 10.3 (L) 13.0 - 17.0 g/dL   HCT 31.0 (L) 39.0 - 52.0 %   MCV 84.0 78.0 - 100.0 fL   MCH 27.9 26.0 - 34.0 pg   MCHC 33.2 30.0 - 36.0 g/dL   RDW 15.2 11.5 - 15.5 %   Platelets 157 150 - 400 K/uL    Comment: Performed at [facility], [place] [address], [place], [place] [address]  Basic metabolic panel     Status: Abnormal  Collection Time: 06/28/17  6:11 AM  Result Value Ref Range   Sodium 134 (L) 135 - 145 mmol/L   Potassium 3.9 3.5 - 5.1 mmol/L   Chloride 105 101 - 111 mmol/L   CO2 16 (L) 22 - 32 mmol/L   Glucose, Bld 172 (H) 65 - 99 mg/dL   BUN 70 (H) 6 - 20 mg/dL   Creatinine, Ser 4.59 (H) 0.61 - 1.24 mg/dL   Calcium 8.9 8.9 - 10.3 mg/dL   GFR calc non Af Amer 13 (L) >60 mL/min   GFR calc Af Amer 15 (L) >60 mL/min    Comment: (NOTE) The eGFR has been calculated using the CKD EPI equation. This calculation has not been validated in all clinical situations. eGFR's persistently <60 mL/min signify possible Chronic Kidney Disease.    Anion gap 13 5 - 15    Comment: Performed at [facility] [address] [place], [place] [address]  Glucose, capillary     Status: Abnormal   Collection Time: 06/28/17  6:35 AM  Result Value Ref Range   Glucose-Capillary 167 (H) 65 - 99 mg/dL  Glucose, capillary     Status: Abnormal   Collection Time: 06/28/17 11:48 AM  Result Value Ref Range   Glucose-Capillary 151 (H) 65 - 99 mg/dL  Glucose, capillary     Status: Abnormal   Collection Time: 06/28/17  1:42 PM  Result Value Ref Range    Glucose-Capillary 143 (H) 65 - 99 mg/dL  Glucose, capillary     Status: Abnormal   Collection Time: 06/28/17  3:38 PM  Result Value Ref Range   Glucose-Capillary 113 (H) 65 - 99 mg/dL  Glucose, capillary     Status: Abnormal   Collection Time: 06/28/17  5:36 PM  Result Value Ref Range   Glucose-Capillary 107 (H) 65 - 99 mg/dL  Glucose, capillary     Status: Abnormal   Collection Time: 06/28/17  9:55 PM  Result Value Ref Range   Glucose-Capillary 193 (H) 65 - 99 mg/dL  Glucose, capillary     Status: Abnormal   Collection Time: 06/29/17  6:35 AM  Result Value Ref Range   Glucose-Capillary 193 (H) 65 - 99 mg/dL  CBC     Status: Abnormal   Collection Time: 06/29/17  6:57 AM  Result Value Ref Range   WBC 11.6 (H) 4.0 - 10.5 K/uL   RBC 3.13 (L) 4.22 - 5.81 MIL/uL   Hemoglobin 8.6 (L) 13.0 - 17.0 g/dL   HCT 26.8 (L) 39.0 - 52.0 %   MCV 85.6 78.0 - 100.0 fL   MCH 27.5 26.0 - 34.0 pg   MCHC 32.1 30.0 - 36.0 g/dL   RDW 15.5 11.5 - 15.5 %   Platelets 158 150 - 400 K/uL    Comment: Performed at [facility], [place] [address], [place],  [address]  Basic metabolic panel     Status: Abnormal   Collection Time: 06/29/17  6:57 AM  Result Value Ref Range   Sodium 133 (L) 135 - 145 mmol/L   Potassium 4.0 3.5 - 5.1 mmol/L   Chloride 106 101 - 111 mmol/L   CO2 15 (L) 22 - 32 mmol/L   Glucose, Bld 212 (H) 65 - 99 mg/dL   BUN 74 (H) 6 - 20 mg/dL   Creatinine, Ser 5.07 (H) 0.61 - 1.24 mg/dL   Calcium 8.5 (L) 8.9 - 10.3 mg/dL   GFR calc non Af Amer 12 (L) >60 mL/min  GFR calc Af Amer 14 (L) >60 mL/min    Comment: (NOTE) The eGFR has been calculated using the CKD EPI equation. This calculation has not been validated in all clinical situations. eGFR's persistently <60 mL/min signify possible Chronic Kidney Disease.    Anion gap 12 5 - 15    Comment: Performed at [facility] [address], [place], [place] [address]  Glucose, capillary     Status: Abnormal   Collection  Time: 06/29/17 12:24 PM  Result Value Ref Range   Glucose-Capillary 241 (H) 65 - 99 mg/dL     ROS:  Pertinent items noted in HPI and remainder of comprehensive ROS otherwise negative.  Physical Exam: Vitals:   06/29/17 0639 06/29/17 1100  BP: 111/74 130/65  Pulse: 81 74  Resp: 20 20  Temp: 97.8 F (36.6 C) 98 F (36.7 C)  SpO2: 96% 96%     General exam: Appears calm and comfortable  Respiratory system: Clear to auscultation. Respiratory effort normal. No wheezing or crackle Cardiovascular system: S1 & S2 heard, RRR.  No pedal edema. No rub Gastrointestinal system: Abdomen is nondistended, soft and nontender. Normal bowel sounds heard. Central nervous system: Alert awake. Extremities: RUE with dressing applied. Skin: No rashes, lesions or ulcers Psychiatry: Judgement and insight appear normal. Mood & affect appropriate.   Assessment/Plan:  #Acute on chronic kidney disease stage IV likely hemodynamically mediated ? Hypotension during OR vs ATN related with vancomycin, ongoing infection. UA with bacteremia and 100 protein.  I will check bladder scan.  Ultrasound kidney ordered by primary team. -Strict ins and out. -Monitor kidney function and electrolytes.  Monitor urine output.  No indication for dialysis now.  Follow-up vancomycin level.  Avoid nephrotoxins and hypotension episode.  #Metabolic acidosis: I will start sodium bicarbonate IV.  Monitor labs.  Discontinue normal saline.  #Anemia multifactorial: Current decrease in hemoglobin likely due to blood loss during OR.  I will check iron studies.  #Hypertension: Blood pressure acceptable.  Currently on metoprolol.  #Necrotizing fasciitis of right forearm and wrist: Plan for another OR procedure tomorrow.  Per orthopedics.  Alexarae Oliva Tanna Furry 06/29/2017, 3:04 PM  [facility].

## 2017-06-29 NOTE — Progress Notes (Addendum)
PROGRESS NOTE    Matthew [name]  VFI:[id number] DOB: 1966-01-25 DOA: 06/25/2017  PCP: [name], MD   Brief Narrative:  52 y.o. male with medical history significant for hypertension, diabetes, chronic kidney disease stage IV, chronic systolic congestive heart failure, history of nonsustained VT status post ICD, coronary artery disease status post CABG. Pt presented with 1 week of erythema of right wrist along with decreased grip and weakness. He went to urgent care and was given an injection of antibiotics and started on a oral course of doxycycline. Cellulitis progressively worsened.    Assessment & Plan:   Principal Problem:   Cellulitis, right forearm / Leukocytosis  - S/P nerve release fasciectomy and tenotomy of the FCU tendon. Per ortho, pt fascia was necrotic and he had massive amounts of thick purulent fluid and required an extensive debridement 4/23. The wound was left open.  - S/P I&D  repeat right forearm tenosynovectomy 4/25; plan to repeat surgery on Saturday  - Continue zosyn, stopped vanco 4/25    Active Problems:   HYPERTENSION, BENIGN - Continue metoprolol     Chronic systolic heart failure ([place]) - Compensated     Anxiety and depression - Continue Prozac, Buspar    Dyslipidemia - Continue Crestor     CKD stage 4 due to type 2 diabetes mellitus ([place]) - Follow up daily BMP - Cr in 03/2017 as high as 3.09 - Cr on admission 4.54 - Cr trending up, likely due to vanco - Renal consulted    DVT prophylaxis: SCD's Code Status: full code  Family Communication: no family at the bedside Disposition Plan: needs surgery saturday    Consultants:   Ortho  Procedures:  I&D 4/23, 4/25  Antimicrobials:   Vanco 4/22 --> 4/26   Zosyn 4/22 -->   Subjective: No overnight events.  Objective: Vitals:   06/28/17 1800 06/28/17 1955 06/29/17 0121 06/29/17 0639  BP: 116/72 (!) 100/46  111/74  Pulse: 71 69  81  Resp: 12 18  20   Temp: 97.7 F  (36.5 C) 97.7 F (36.5 C)  97.8 F (36.6 C)  TempSrc: Oral Oral  Oral  SpO2: 100% (!) 85% 96% 96%  Weight:      Height:        Intake/Output Summary (Last 24 hours) at 06/29/2017 1221 Last data filed at 06/29/2017 0900 Gross per 24 hour  Intake 580 ml  Output -  Net 580 ml   Filed Weights   06/25/17 2049  Weight: 115.7 kg (255 lb)    Physical Exam  Constitutional: Appears well-developed and well-nourished. No distress.  HENT: Normocephalic. External right and left ear normal. Oropharynx is clear and moist.  Eyes: Conjunctivae and EOM are normal. PERRLA, no scleral icterus.  Neck: Normal ROM. Neck supple. No JVD. No tracheal deviation. No thyromegaly.  CVS: RRR, S1/S2 +, no murmurs, no gallops, no carotid bruit.  Pulmonary: Effort and breath sounds normal, no stridor, rhonchi, wheezes, rales.  Abdominal: Soft. BS +,  no distension, tenderness, rebound or guarding.  Musculoskeletal: Normal range of motion. No edema and no tenderness.  Lymphadenopathy: No lymphadenopathy noted, cervical, inguinal. Neuro: Alert. Normal reflexes, muscle tone coordination. No cranial nerve deficit. Skin: right forearm dressing in place  Psychiatric: Normal mood and affect. Behavior, judgment, thought content normal.    Data Reviewed: I have personally reviewed following labs and imaging studies  CBC: Recent Labs  Lab 06/25/17 2104 06/26/17 1003 06/27/17 0558 06/28/17 0611 06/29/17 0657  WBC 16.5* 14.3* 13.8*  13.0* 11.6*  NEUTROABS 14.1*  --   --   --   --   HGB 11.6* 10.8* 10.7* 10.3* 8.6*  HCT 34.2* 31.3* 30.8* 31.0* 26.8*  MCV 82.6 82.6 83.5 84.0 85.6  PLT 134* 126* 131* 157 174   Basic Metabolic Panel: Recent Labs  Lab 06/25/17 2104 06/26/17 1003 06/27/17 0558 06/28/17 0611 06/29/17 0657  NA 128*  --  130* 134* 133*  K 3.7  --  3.7 3.9 4.0  CL 98*  --  102 105 106  CO2 15*  --  13* 16* 15*  GLUCOSE 158*  --  160* 172* 212*  BUN 66*  --  70* 70* 74*  CREATININE 4.54*  4.53* 4.39* 4.59* 5.07*  CALCIUM 9.6  --  8.9 8.9 8.5*   GFR: Estimated Creatinine Clearance: 21.1 mL/min (A) (by C-G formula based on SCr of 5.07 mg/dL (H)). Liver Function Tests: No results for input(s): AST, ALT, ALKPHOS, BILITOT, PROT, ALBUMIN in the last 168 hours. No results for input(s): LIPASE, AMYLASE in the last 168 hours. No results for input(s): AMMONIA in the last 168 hours. Coagulation Profile: No results for input(s): INR, PROTIME in the last 168 hours. Cardiac Enzymes: No results for input(s): CKTOTAL, CKMB, CKMBINDEX, TROPONINI in the last 168 hours. BNP (last 3 results) No results for input(s): PROBNP in the last 8760 hours. HbA1C: No results for input(s): HGBA1C in the last 72 hours. CBG: Recent Labs  Lab 06/28/17 1342 06/28/17 1538 06/28/17 1736 06/28/17 2155 06/29/17 0635  GLUCAP 143* 113* 107* 193* 193*   Lipid Profile: No results for input(s): CHOL, HDL, LDLCALC, TRIG, CHOLHDL, LDLDIRECT in the last 72 hours. Thyroid Function Tests: No results for input(s): TSH, T4TOTAL, FREET4, T3FREE, THYROIDAB in the last 72 hours. Anemia Panel: No results for input(s): VITAMINB12, FOLATE, FERRITIN, TIBC, IRON, RETICCTPCT in the last 72 hours. Urine analysis:    Component Value Date/Time   COLORURINE YELLOW 06/25/2017 2110   APPEARANCEUR HAZY (A) 06/25/2017 2110   LABSPEC 1.005 06/25/2017 2110   PHURINE 5.0 06/25/2017 2110   GLUCOSEU 50 (A) 06/25/2017 2110   HGBUR MODERATE (A) 06/25/2017 2110   BILIRUBINUR NEGATIVE 06/25/2017 2110   KETONESUR NEGATIVE 06/25/2017 2110   PROTEINUR 100 (A) 06/25/2017 2110   UROBILINOGEN 1.0 08/31/2009 1526   NITRITE NEGATIVE 06/25/2017 2110   LEUKOCYTESUR NEGATIVE 06/25/2017 2110   Sepsis Labs: @LABRCNTIP (procalcitonin:4,lacticidven:4)   ) Recent Results (from the past 240 hour(s))  Culture, blood (routine x 2)     Status: None (Preliminary result)   Collection Time: 06/25/17  9:04 PM  Result Value Ref Range Status    Specimen Description BLOOD RIGHT ANTECUBITAL  Final   Special Requests   Final    BOTTLES DRAWN AEROBIC AND ANAEROBIC Blood Culture adequate volume   Culture   Final    NO GROWTH 4 DAYS Performed at [facility], [place] [address], [place], [place] [address]    Report Status PENDING  Incomplete  Aerobic/Anaerobic Culture (surgical/deep wound)     Status: None (Preliminary result)   Collection Time: 06/26/17  8:35 PM  Result Value Ref Range Status   Specimen Description ABSCESS RIGHT FOREARM  Final   Special Requests PATIENT ON FOLLOWING ZOSYN AND VANC  Final   Gram Stain   Final    ABUNDANT WBC PRESENT, PREDOMINANTLY PMN FEW GRAM POSITIVE COCCI Performed at [facility], [place] [address], [place], [place] [address]    Culture   Final  FEW METHICILLIN RESISTANT STAPHYLOCOCCUS AUREUS NO ANAEROBES ISOLATED; CULTURE IN PROGRESS FOR 5 DAYS    Report Status PENDING  Incomplete   Organism ID, Bacteria METHICILLIN RESISTANT STAPHYLOCOCCUS AUREUS  Final      Susceptibility   Methicillin resistant staphylococcus aureus - MIC*    CIPROFLOXACIN >=8 RESISTANT Resistant     ERYTHROMYCIN >=8 RESISTANT Resistant     GENTAMICIN <=0.5 SENSITIVE Sensitive     OXACILLIN >=4 RESISTANT Resistant     TETRACYCLINE <=1 SENSITIVE Sensitive     VANCOMYCIN 1 SENSITIVE Sensitive     TRIMETH/SULFA <=10 SENSITIVE Sensitive     CLINDAMYCIN <=0.25 SENSITIVE Sensitive     RIFAMPIN <=0.5 SENSITIVE Sensitive     Inducible Clindamycin NEGATIVE Sensitive     * FEW METHICILLIN RESISTANT STAPHYLOCOCCUS AUREUS  Surgical pcr screen     Status: Abnormal   Collection Time: 06/27/17 12:59 PM  Result Value Ref Range Status   MRSA, PCR POSITIVE (A) NEGATIVE Final    Comment: RESULT CALLED TO, READ BACK BY AND VERIFIED WITH: Burna Mortimer RN 15:40 06/27/17 ([email or web address])    Staphylococcus aureus POSITIVE (A) NEGATIVE Final    Comment: (NOTE) The Xpert SA Assay (FDA approved for NASAL specimens in patients 1 years  of age and older), is one component of a comprehensive surveillance program. It is not intended to diagnose infection nor to guide or monitor treatment. Performed at [facility], [place] [address], [place], [place] [address]       Radiology Studies: Ct Forearm Right Wo Contrast  Result Date: 06/26/2017 CLINICAL DATA:  Cellulitis of the volar mid forearm. EXAM: CT OF THE RIGHT FOREARM WITHOUT CONTRAST TECHNIQUE: Multidetector CT imaging was performed according to the standard protocol. Multiplanar CT image reconstructions were also generated. COMPARISON:  None. FINDINGS: Bones/Joint/Cartilage No acute fracture or dislocation. No osseous destruction. Joint spaces are preserved. Bone mineralization is normal. Ligaments Suboptimally assessed by CT. Muscles and Tendons There are foci of soft tissue gas with prominent fluid along the distal flexor carpi ulnaris myotendinous junction. There is enlargement and hypodensity of the distal flexor carpi ulnaris muscle. Remaining flexor and extensor muscles and tendons are grossly unremarkable. Soft tissues Prominent skin thickening and subcutaneous edema along the volar forearm and wrist. Vascular calcifications. IMPRESSION: 1. Foci of soft tissue gas and prominent fluid along the distal flexor carpi ulnaris myotendinous junction with enlargement and hypodensity of the distal muscle, concerning for necrotizing fasciitis/myositis. 2. Prominent skin thickening and subcutaneous edema along the volar forearm and wrist, consistent with cellulitis given clinical history. Electronically Signed   By: Titus Dubin M.D.   On: 06/26/2017 14:39        Scheduled Meds: . aspirin EC  325 mg Oral Daily  . baclofen  20 mg Oral TID  . busPIRone  10 mg Oral BID  . FLUoxetine  20 mg Oral Daily  . fluticasone  2 spray Each Nare Daily  . insulin aspart  0-15 Units Subcutaneous TID WC  . insulin aspart  0-5 Units Subcutaneous QHS  . insulin aspart protamine- aspart   20 Units Subcutaneous BID WC  . metoprolol succinate  50 mg Oral Daily  . mupirocin ointment  1 application Nasal BID  . oxymetazoline  1 spray Each Nare BID  . rosuvastatin  40 mg Oral Daily  . senna-docusate  1 tablet Oral BID  . vitamin C  1,000 mg Oral Daily   Continuous Infusions: . methocarbamol (ROBAXIN)  IV    . piperacillin-tazobactam (  ZOSYN)  IV Stopped (06/27/17 2117)  . [START ON 06/28/2017] vancomycin       LOS: 3 days    Time spent: 25 minutes Greater than 50% of the time spent on counseling and coordinating the care.   Leisa Lenz, MD [facility] Pager [phone]  If 7PM-7AM, please contact night-coverage [email or web address] Password [facility] 06/29/2017, 12:21 PM

## 2017-06-29 NOTE — Progress Notes (Signed)
Pharmacy Antibiotic Note  Matthew [name] is a 52 y.o. male admitted on 06/25/2017 with cellulitis.  Pharmacy has been consulted for vancomycin dosing.   Abx for cellulitis / nec fasc. S/p I&D with significant surgical intervention. Ortho plans to return to OR on 4/27.   Plan: Will check VR at 0900 tomorrow Change Zosyn to 3.375g IV q 12hr. Monitor kidney function and cultures  Can we stop Zosyn?  Height: 5\' 9"  (175.3 cm) Weight: 255 lb (115.7 kg) IBW/kg (Calculated) : 70.7  Temp (24hrs), Avg:97.7 F (36.5 C), Min:97.4 F (36.3 C), Max:97.8 F (36.6 C)  Recent Labs  Lab 06/25/17 2104 06/25/17 2149 06/26/17 1003 06/27/17 0558 06/28/17 0611 06/29/17 0657  WBC 16.5*  --  14.3* 13.8* 13.0* 11.6*  CREATININE 4.54*  --  4.53* 4.39* 4.59* 5.07*  LATICACIDVEN  --  1.68  --   --   --   --     Estimated Creatinine Clearance: 21.1 mL/min (A) (by C-G formula based on SCr of 5.07 mg/dL (H)).    Allergies  Allergen Reactions  . Ace Inhibitors Cough  . Cephalexin Other (See Comments)    Resistant to antibiotic  . Beta Adrenergic Blockers Cough    Antimicrobials this admission: 4/23 vancomycin >>  4/23 Zosyn >>   Dose adjustments this admission:   Microbiology results: 4/22 BCx: neg 4/23 R forearm abscess: staph aureus (pending) 4/24 mrsa pcr: pos  Thank you for allowing pharmacy to be a part of this patient's care.  Reginia Naas 06/29/2017 11:14 AM

## 2017-06-29 NOTE — Progress Notes (Signed)
Patient ID: Matthew [name], male   DOB: 1966/01/07, 52 y.o.   MRN: [id number] Patient is in good spirits.  He is alert and oriented.  He remains neurovascularly intact.  He has some discharge on his dressing which is expected.   We will plan for repeat irrigation and debridement tomorrow in hopes that we can close his wound.  All questions have been addressed.  Chart reviewed labs noted and micro results noted as well  Felipe Cabell MD

## 2017-06-30 ENCOUNTER — Encounter (HOSPITAL_COMMUNITY)
Admission: EM | Disposition: A | Discharge status: Home / Self Care | Payer: Self-pay | Source: Home / Self Care | Attending: Internal Medicine

## 2017-06-30 ENCOUNTER — Inpatient Hospital Stay (HOSPITAL_COMMUNITY): Payer: Medicare Other | Admitting: Anesthesiology

## 2017-06-30 ENCOUNTER — Encounter (HOSPITAL_COMMUNITY): Payer: Self-pay | Admitting: Certified Registered"

## 2017-06-30 DIAGNOSIS — Z794 Long term (current) use of insulin: Secondary | ICD-10-CM

## 2017-06-30 DIAGNOSIS — M726 Necrotizing fasciitis: Secondary | ICD-10-CM

## 2017-06-30 DIAGNOSIS — E119 Type 2 diabetes mellitus without complications: Secondary | ICD-10-CM

## 2017-06-30 HISTORY — PX: I & D EXTREMITY: SHX5045

## 2017-06-30 LAB — CBC
HEMATOCRIT: 23.4 % — AB (ref 39.0–52.0)
HEMOGLOBIN: 7.7 g/dL — AB (ref 13.0–17.0)
MCH: 28 pg (ref 26.0–34.0)
MCHC: 32.9 g/dL (ref 30.0–36.0)
MCV: 85.1 fL (ref 78.0–100.0)
Platelets: 163 10*3/uL (ref 150–400)
RBC: 2.75 MIL/uL — ABNORMAL LOW (ref 4.22–5.81)
RDW: 15.5 % (ref 11.5–15.5)
WBC: 11.5 10*3/uL — AB (ref 4.0–10.5)

## 2017-06-30 LAB — GLUCOSE, CAPILLARY
GLUCOSE-CAPILLARY: 103 mg/dL — AB (ref 65–99)
GLUCOSE-CAPILLARY: 139 mg/dL — AB (ref 65–99)
GLUCOSE-CAPILLARY: 155 mg/dL — AB (ref 65–99)
Glucose-Capillary: 160 mg/dL — ABNORMAL HIGH (ref 65–99)

## 2017-06-30 LAB — BASIC METABOLIC PANEL
ANION GAP: 13 (ref 5–15)
BUN: 77 mg/dL — ABNORMAL HIGH (ref 6–20)
CHLORIDE: 97 mmol/L — AB (ref 101–111)
CO2: 18 mmol/L — AB (ref 22–32)
Calcium: 8.6 mg/dL — ABNORMAL LOW (ref 8.9–10.3)
Creatinine, Ser: 5.49 mg/dL — ABNORMAL HIGH (ref 0.61–1.24)
GFR calc Af Amer: 12 mL/min — ABNORMAL LOW (ref >60)
GFR calc non Af Amer: 11 mL/min — ABNORMAL LOW (ref >60)
GLUCOSE: 138 mg/dL — AB (ref 65–99)
Potassium: 3.6 mmol/L (ref 3.5–5.1)
Sodium: 128 mmol/L — ABNORMAL LOW (ref 135–145)

## 2017-06-30 LAB — VANCOMYCIN, RANDOM: VANCOMYCIN RM: 13

## 2017-06-30 LAB — FERRITIN: FERRITIN: 370 ng/mL — AB (ref 24–336)

## 2017-06-30 LAB — IRON AND TIBC
Iron: 66 ug/dL (ref 45–182)
SATURATION RATIOS: 36 % (ref 17.9–39.5)
TIBC: 182 ug/dL — ABNORMAL LOW (ref 250–450)
UIBC: 116 ug/dL

## 2017-06-30 LAB — CULTURE, BLOOD (ROUTINE X 2)
Culture: NO GROWTH
Special Requests: ADEQUATE

## 2017-06-30 SURGERY — IRRIGATION AND DEBRIDEMENT EXTREMITY
Anesthesia: General | Site: Arm Lower | Laterality: Right

## 2017-06-30 MED ORDER — LIDOCAINE 2% (20 MG/ML) 5 ML SYRINGE
INTRAMUSCULAR | Status: AC
  Filled 2017-06-30: qty 5

## 2017-06-30 MED ORDER — FENTANYL CITRATE (PF) 250 MCG/5ML IJ SOLN
INTRAMUSCULAR | Status: DC | PRN
  Administered 2017-06-30: 50 ug via INTRAVENOUS

## 2017-06-30 MED ORDER — SODIUM CHLORIDE 0.9 % IR SOLN
Status: DC | PRN
  Administered 2017-06-30: 3000 mL

## 2017-06-30 MED ORDER — FENTANYL CITRATE (PF) 250 MCG/5ML IJ SOLN
INTRAMUSCULAR | Status: AC
  Filled 2017-06-30: qty 5

## 2017-06-30 MED ORDER — PHENYLEPHRINE 40 MCG/ML (10ML) SYRINGE FOR IV PUSH (FOR BLOOD PRESSURE SUPPORT)
PREFILLED_SYRINGE | INTRAVENOUS | Status: AC
  Filled 2017-06-30: qty 10

## 2017-06-30 MED ORDER — VANCOMYCIN HCL 10 G IV SOLR
1500.0000 mg | INTRAVENOUS | Status: DC
  Administered 2017-06-30: 1500 mg via INTRAVENOUS
  Filled 2017-06-30 (×2): qty 1500

## 2017-06-30 MED ORDER — SODIUM BICARBONATE 650 MG PO TABS
1300.0000 mg | ORAL_TABLET | Freq: Two times a day (BID) | ORAL | Status: DC
  Administered 2017-06-30 – 2017-07-03 (×7): 1300 mg via ORAL
  Filled 2017-06-30 (×7): qty 2

## 2017-06-30 MED ORDER — SODIUM CHLORIDE 0.9 % IV SOLN
INTRAVENOUS | Status: DC | PRN
  Administered 2017-06-30 (×2): via INTRAVENOUS

## 2017-06-30 MED ORDER — EPHEDRINE 5 MG/ML INJ
INTRAVENOUS | Status: AC
  Filled 2017-06-30: qty 10

## 2017-06-30 MED ORDER — PHENYLEPHRINE HCL 10 MG/ML IJ SOLN
INTRAMUSCULAR | Status: DC | PRN
  Administered 2017-06-30 (×3): 80 ug via INTRAVENOUS

## 2017-06-30 MED ORDER — ONDANSETRON HCL 4 MG/2ML IJ SOLN
INTRAMUSCULAR | Status: AC
  Filled 2017-06-30: qty 2

## 2017-06-30 MED ORDER — MIDAZOLAM HCL 2 MG/2ML IJ SOLN
INTRAMUSCULAR | Status: AC
  Filled 2017-06-30: qty 2

## 2017-06-30 MED ORDER — PROPOFOL 10 MG/ML IV BOLUS
INTRAVENOUS | Status: DC | PRN
  Administered 2017-06-30: 140 mg via INTRAVENOUS

## 2017-06-30 MED ORDER — DEXAMETHASONE SODIUM PHOSPHATE 10 MG/ML IJ SOLN
INTRAMUSCULAR | Status: AC
  Filled 2017-06-30: qty 1

## 2017-06-30 MED ORDER — 0.9 % SODIUM CHLORIDE (POUR BTL) OPTIME
TOPICAL | Status: DC | PRN
  Administered 2017-06-30: 1000 mL

## 2017-06-30 MED ORDER — LIDOCAINE 2% (20 MG/ML) 5 ML SYRINGE
INTRAMUSCULAR | Status: DC | PRN
  Administered 2017-06-30: 80 mg via INTRAVENOUS

## 2017-06-30 MED ORDER — ROCURONIUM BROMIDE 10 MG/ML (PF) SYRINGE
PREFILLED_SYRINGE | INTRAVENOUS | Status: AC
  Filled 2017-06-30: qty 5

## 2017-06-30 MED ORDER — MIDAZOLAM HCL 2 MG/2ML IJ SOLN
INTRAMUSCULAR | Status: DC | PRN
  Administered 2017-06-30: 2 mg via INTRAVENOUS

## 2017-06-30 MED ORDER — PROPOFOL 10 MG/ML IV BOLUS
INTRAVENOUS | Status: AC
  Filled 2017-06-30: qty 20

## 2017-06-30 MED ORDER — ONDANSETRON HCL 4 MG/2ML IJ SOLN
INTRAMUSCULAR | Status: DC | PRN
  Administered 2017-06-30: 4 mg via INTRAVENOUS

## 2017-06-30 SURGICAL SUPPLY — 51 items
BANDAGE ACE 4X5 VEL STRL LF (GAUZE/BANDAGES/DRESSINGS) ×3 IMPLANT
BANDAGE ELASTIC 4 VELCRO ST LF (GAUZE/BANDAGES/DRESSINGS) ×2 IMPLANT
BNDG CMPR MD 5X2 ELC HKLP STRL (GAUZE/BANDAGES/DRESSINGS) ×1
BNDG CONFORM 2 STRL LF (GAUZE/BANDAGES/DRESSINGS) IMPLANT
BNDG ELASTIC 2 VLCR STRL LF (GAUZE/BANDAGES/DRESSINGS) ×2 IMPLANT
BNDG GAUZE ELAST 4 BULKY (GAUZE/BANDAGES/DRESSINGS) ×5 IMPLANT
CORDS BIPOLAR (ELECTRODE) ×3 IMPLANT
COVER SURGICAL LIGHT HANDLE (MISCELLANEOUS) ×3 IMPLANT
CUFF TOURNIQUET SINGLE 18IN (TOURNIQUET CUFF) ×3 IMPLANT
CUFF TOURNIQUET SINGLE 24IN (TOURNIQUET CUFF) IMPLANT
DRSG ADAPTIC 3X8 NADH LF (GAUZE/BANDAGES/DRESSINGS) ×3 IMPLANT
DRSG MEPITEL 3X4 ME34 (GAUZE/BANDAGES/DRESSINGS) ×2 IMPLANT
GAUZE SPONGE 4X4 12PLY STRL (GAUZE/BANDAGES/DRESSINGS) ×3 IMPLANT
GAUZE SPONGE 4X4 16PLY XRAY LF (GAUZE/BANDAGES/DRESSINGS) ×2 IMPLANT
GAUZE XEROFORM 1X8 LF (GAUZE/BANDAGES/DRESSINGS) ×3 IMPLANT
GLOVE BIOGEL M 8.0 STRL (GLOVE) ×3 IMPLANT
GLOVE SS BIOGEL STRL SZ 8 (GLOVE) ×1 IMPLANT
GLOVE SUPERSENSE BIOGEL SZ 8 (GLOVE) ×2
GOWN STRL REUS W/ TWL LRG LVL3 (GOWN DISPOSABLE) ×1 IMPLANT
GOWN STRL REUS W/ TWL XL LVL3 (GOWN DISPOSABLE) ×2 IMPLANT
GOWN STRL REUS W/TWL LRG LVL3 (GOWN DISPOSABLE) ×3
GOWN STRL REUS W/TWL XL LVL3 (GOWN DISPOSABLE) ×6
KIT BASIN OR (CUSTOM PROCEDURE TRAY) ×3 IMPLANT
KIT TURNOVER KIT B (KITS) ×3 IMPLANT
MANIFOLD NEPTUNE II (INSTRUMENTS) ×3 IMPLANT
NDL HYPO 25GX1X1/2 BEV (NEEDLE) IMPLANT
NEEDLE HYPO 25GX1X1/2 BEV (NEEDLE) IMPLANT
NS IRRIG 1000ML POUR BTL (IV SOLUTION) ×3 IMPLANT
PACK ORTHO EXTREMITY (CUSTOM PROCEDURE TRAY) ×3 IMPLANT
PAD ARMBOARD 7.5X6 YLW CONV (MISCELLANEOUS) ×3 IMPLANT
PAD CAST 4YDX4 CTTN HI CHSV (CAST SUPPLIES) ×1 IMPLANT
PADDING CAST COTTON 4X4 STRL (CAST SUPPLIES) ×3
PADDING CAST SYNTHETIC 2 (CAST SUPPLIES) ×2
PADDING CAST SYNTHETIC 2X4 NS (CAST SUPPLIES) IMPLANT
PADDING CAST SYNTHETIC 4 (CAST SUPPLIES) ×4
PADDING CAST SYNTHETIC 4X4 STR (CAST SUPPLIES) IMPLANT
SCRUB BETADINE 4OZ XXX (MISCELLANEOUS) ×3 IMPLANT
SOL PREP POV-IOD 4OZ 10% (MISCELLANEOUS) ×3 IMPLANT
SPLINT PLASTER EXTRA FAST 3X15 (CAST SUPPLIES) ×2
SPLINT PLASTER GYPS XFAST 3X15 (CAST SUPPLIES) IMPLANT
SPONGE LAP 4X18 X RAY DECT (DISPOSABLE) ×3 IMPLANT
SUT PROLENE 2 0 FS (SUTURE) ×10 IMPLANT
SUT PROLENE 3 0 PS 2 (SUTURE) ×2 IMPLANT
SWAB CULTURE ESWAB REG 1ML (MISCELLANEOUS) IMPLANT
SYR CONTROL 10ML LL (SYRINGE) IMPLANT
TOWEL OR 17X24 6PK STRL BLUE (TOWEL DISPOSABLE) ×3 IMPLANT
TOWEL OR 17X26 10 PK STRL BLUE (TOWEL DISPOSABLE) ×3 IMPLANT
TUBE CONNECTING 12'X1/4 (SUCTIONS) ×1
TUBE CONNECTING 12X1/4 (SUCTIONS) ×2 IMPLANT
WATER STERILE IRR 1000ML POUR (IV SOLUTION) ×3 IMPLANT
YANKAUER SUCT BULB TIP NO VENT (SUCTIONS) ×3 IMPLANT

## 2017-06-30 NOTE — Anesthesia Preprocedure Evaluation (Signed)
Anesthesia Evaluation  Patient identified by MRN, date of birth, ID band Patient awake    Reviewed: Allergy & Precautions, NPO status , Patient's Chart, lab work & pertinent test results, reviewed documented beta blocker date and time   History of Anesthesia Complications Negative for: history of anesthetic complications  Airway Mallampati: II  TM Distance: >3 FB Neck ROM: Full    Dental  (+) Caps, Missing, Dental Advisory Given   Pulmonary sleep apnea (does not use CPAP) , former smoker,    breath sounds clear to auscultation       Cardiovascular hypertension, Pt. on medications and Pt. on home beta blockers (-) angina+ CAD, + CABG and +CHF (ischemic cardiomyopathy)  + dysrhythmias (h/o VF arrest) Ventricular Fibrillation + pacemaker [facility] Sci dual chamber) + Cardiac Defibrillator  Rhythm:Regular  '11 ECHO: Inferior, septal, apical hypokinesis, EF 35%. '12 Myoview: low risk   Neuro/Psych  Headaches, Anxiety    GI/Hepatic negative GI ROS, Neg liver ROS,   Endo/Other  diabetes, Insulin DependentMorbid obesity  Renal/GU Renal InsufficiencyRenal disease     Musculoskeletal   Abdominal (+) + obese,   Peds  Hematology  (+) Blood dyscrasia (Hb 10.8, plt 126), ,   Anesthesia Other Findings   Reproductive/Obstetrics                             Anesthesia Physical Anesthesia Plan  ASA: III  Anesthesia Plan: General   Post-op Pain Management:    Induction: Intravenous  PONV Risk Score and Plan: 2 and Ondansetron and Dexamethasone  Airway Management Planned: LMA  Additional Equipment: None  Intra-op Plan:   Post-operative Plan: Extubation in OR  Informed Consent: I have reviewed the patients History and Physical, chart, labs and discussed the procedure including the risks, benefits and alternatives for the proposed anesthesia with the patient or authorized representative who has  indicated his/her understanding and acceptance.   Dental advisory given  Plan Discussed with: CRNA and Surgeon  Anesthesia Plan Comments:         Anesthesia Quick Evaluation

## 2017-06-30 NOTE — Transfer of Care (Signed)
Immediate Anesthesia Transfer of Care Note  Patient: Matthew [name]  Procedure(s) Performed: Repeat irrigation and debridement right forearm and reconstruction as necessary (Right Arm Lower)  Patient Location: PACU  Anesthesia Type:General  Level of Consciousness: sedated  Airway & Oxygen Therapy: Patient Spontanous Breathing and Patient connected to nasal cannula oxygen  Post-op Assessment: Report given to RN and Post -op Vital signs reviewed and stable  Post vital signs: Reviewed and stable  Last Vitals:  Vitals Value Taken Time  BP 134/82 06/30/2017  4:25 PM  Temp    Pulse 70 06/30/2017  4:27 PM  Resp 7 06/30/2017  4:27 PM  SpO2 99 % 06/30/2017  4:27 PM  Vitals shown include unvalidated device data.  Last Pain:  Vitals:   06/30/17 1100  TempSrc: Oral  PainSc:       Patients Stated Pain Goal: 3 (11/26/28 0762)  Complications: No apparent anesthesia complications

## 2017-06-30 NOTE — Op Note (Signed)
Please see full dictation [phone]  Status post irrigation and debridement with radical tenosynovectomy and nerve neuro lysis about the median and ulnar nerves.  Patient underwent closure today without difficulty.  TLS drain was placed.  Patient is improving nicely.  I feel he can be tailored to vancomycin given the MRSA and DC the Zosyn at this point.  We will monitor his condition daily and ask for elevation movement and massage to the fingers.  All questions have been addressed.  This is the best the wound is looked.  No further washouts are planned at this time.  Deashia Soule MD

## 2017-06-30 NOTE — Anesthesia Postprocedure Evaluation (Signed)
Anesthesia Post Note  Patient: Matthew [name]  Procedure(s) Performed: Repeat irrigation and debridement right forearm and reconstruction as necessary (Right Arm Lower)     Patient location during evaluation: PACU Anesthesia Type: General Level of consciousness: awake and alert Pain management: pain level controlled Vital Signs Assessment: post-procedure vital signs reviewed and stable Respiratory status: spontaneous breathing, nonlabored ventilation, respiratory function stable and patient connected to nasal cannula oxygen Cardiovascular status: blood pressure returned to baseline and stable Postop Assessment: no apparent nausea or vomiting Anesthetic complications: no    Last Vitals:  Vitals:   06/30/17 1700 06/30/17 1710  BP:  134/78  Pulse: 73 69  Resp: 18 12  Temp:  (!) 36.3 C  SpO2: 99% 100%    Last Pain:  Vitals:   06/30/17 1710  TempSrc:   PainSc: 0-No pain                 Pranika Finks

## 2017-06-30 NOTE — Progress Notes (Signed)
PROGRESS NOTE  Matthew [name] [id number]:[id number] DOB: 1965-06-05 DOA: 06/25/2017 PCP: [name], MD  Brief Narrative:  52 y.o.malewith medical history significant forhypertension, diabetes, chronic kidney disease stage IV, chronic systolic congestive heart failure, history of nonsustained VT status post ICD, coronary artery disease status post CABG. Pt presented with 1 week of erythema of right wrist along with decreased grip and weakness. He went to urgent care and was given aninjection of antibiotics and started on a oral course of doxycycline. Cellulitis progressively worsened.    HPI/Recap of past 24 hours:  Patient is seen after returned from OR He reports pain is well controlled No fever  Assessment/Plan: Principal Problem:   Cellulitis Active Problems:   HYPERTENSION, BENIGN   Chronic systolic heart failure ([place])   CKD stage 4 due to type 2 diabetes mellitus ([place])   Dyslipidemia associated with type 2 diabetes mellitus ([place])   Uncontrolled type 2 diabetes mellitus with stage 4 chronic kidney disease, with long-term current use of insulin ([facility])   Right forearm cellulitis   AKI (acute kidney injury) ([place])   necrotizing fasciitis of the right forearm, wrist, -S/p repeated I/D on 4/23, 4/25, 4/27 -wound culture +mrsa -Continue vanc -blood culture no growth, likely able to discontinue zosyn if patient continue to improve -will follow ortho recommendations  AKI on CKDIV, metabolic acidosis Good Urine output , lytes ok, does not appear volume overloaded Renal US" medical renal disease" 'No hydronephrosis" ua + protein, no infection,  Nephrology following  Insulin dependent DM2 a1c pending Am blood glucose today is 138 Continue insulin ,adjust prn  HTN/chronic systolic chf No recent echo, he does not appear volume overloaded Continue lopressor, home lasix held since admission Close monitor volume status  Anxiety and depression - Continue Prozac,  Buspar    Code Status: full  Family Communication: patient   Disposition Plan: not ready to discharge, need improvement of  renal function Need ortho and nephrology clearance   Consultants:  Ortho  neprhology  Procedures:  I&D 4/23, 4/25, 4/27  Antibiotics:  As above   Objective: BP 131/61 (BP Location: Left Arm)   Pulse 75   Temp 98 F (36.7 C) (Oral)   Resp 20   Ht 5\' 9"  (1.753 m)   Wt 115.7 kg (255 lb)   SpO2 97%   BMI 37.66 kg/m   Intake/Output Summary (Last 24 hours) at 06/30/2017 1621 Last data filed at 06/30/2017 0905 Gross per 24 hour  Intake 1613.17 ml  Output 550 ml  Net 1063.17 ml   Filed Weights   06/25/17 2049  Weight: 115.7 kg (255 lb)    Exam: Patient is examined daily including today on 06/30/2017, exams remain the same as of yesterday except that has changed    General:  NAD, flat affect  Cardiovascular: RRR  Respiratory: CTABL  Abdomen: Soft/ND/NT, positive BS  Musculoskeletal: right arm post op changes, dressing in place, + drain  Neuro: alert, oriented   Data Reviewed: Basic Metabolic Panel: Recent Labs  Lab 06/25/17 2104 06/26/17 1003 06/27/17 0558 06/28/17 0611 06/29/17 0657 06/30/17 0901  NA 128*  --  130* 134* 133* 128*  K 3.7  --  3.7 3.9 4.0 3.6  CL 98*  --  102 105 106 97*  CO2 15*  --  13* 16* 15* 18*  GLUCOSE 158*  --  160* 172* 212* 138*  BUN 66*  --  70* 70* 74* 77*  CREATININE 4.54* 4.53* 4.39* 4.59* 5.07* 5.49*  CALCIUM 9.6  --  8.9 8.9 8.5* 8.6*   Liver Function Tests: No results for input(s): AST, ALT, ALKPHOS, BILITOT, PROT, ALBUMIN in the last 168 hours. No results for input(s): LIPASE, AMYLASE in the last 168 hours. No results for input(s): AMMONIA in the last 168 hours. CBC: Recent Labs  Lab 06/25/17 2104 06/26/17 1003 06/27/17 0558 06/28/17 0611 06/29/17 0657 06/30/17 0901  WBC 16.5* 14.3* 13.8* 13.0* 11.6* 11.5*  NEUTROABS 14.1*  --   --   --   --   --   HGB 11.6* 10.8* 10.7*  10.3* 8.6* 7.7*  HCT 34.2* 31.3* 30.8* 31.0* 26.8* 23.4*  MCV 82.6 82.6 83.5 84.0 85.6 85.1  PLT 134* 126* 131* 157 158 163   Cardiac Enzymes:   No results for input(s): CKTOTAL, CKMB, CKMBINDEX, TROPONINI in the last 168 hours. BNP (last 3 results) No results for input(s): BNP in the last 8760 hours.  ProBNP (last 3 results) No results for input(s): PROBNP in the last 8760 hours.  CBG: Recent Labs  Lab 06/29/17 1224 06/29/17 1842 06/29/17 2148 06/30/17 0715 06/30/17 1419  GLUCAP 241* 334* 202* 103* 160*    Recent Results (from the past 240 hour(s))  Culture, blood (routine x 2)     Status: None   Collection Time: 06/25/17  9:04 PM  Result Value Ref Range Status   Specimen Description BLOOD RIGHT ANTECUBITAL  Final   Special Requests   Final    BOTTLES DRAWN AEROBIC AND ANAEROBIC Blood Culture adequate volume   Culture   Final    NO GROWTH 5 DAYS Performed at [facility], [place] [address], [place], [place] [address]    Report Status 06/30/2017 FINAL  Final  Aerobic/Anaerobic Culture (surgical/deep wound)     Status: None (Preliminary result)   Collection Time: 06/26/17  8:35 PM  Result Value Ref Range Status   Specimen Description ABSCESS RIGHT FOREARM  Final   Special Requests PATIENT ON FOLLOWING ZOSYN AND VANC  Final   Gram Stain   Final    ABUNDANT WBC PRESENT, PREDOMINANTLY PMN FEW GRAM POSITIVE COCCI Performed at [facility], [place] [address] [place], [place] [address]    Culture   Final    FEW METHICILLIN RESISTANT STAPHYLOCOCCUS AUREUS NO ANAEROBES ISOLATED; CULTURE IN PROGRESS FOR 5 DAYS    Report Status PENDING  Incomplete   Organism ID, Bacteria METHICILLIN RESISTANT STAPHYLOCOCCUS AUREUS  Final      Susceptibility   Methicillin resistant staphylococcus aureus - MIC*    CIPROFLOXACIN >=8 RESISTANT Resistant     ERYTHROMYCIN >=8 RESISTANT Resistant     GENTAMICIN <=0.5 SENSITIVE Sensitive     OXACILLIN >=4 RESISTANT Resistant      TETRACYCLINE <=1 SENSITIVE Sensitive     VANCOMYCIN 1 SENSITIVE Sensitive     TRIMETH/SULFA <=10 SENSITIVE Sensitive     CLINDAMYCIN <=0.25 SENSITIVE Sensitive     RIFAMPIN <=0.5 SENSITIVE Sensitive     Inducible Clindamycin NEGATIVE Sensitive     * FEW METHICILLIN RESISTANT STAPHYLOCOCCUS AUREUS  Surgical pcr screen     Status: Abnormal   Collection Time: 06/27/17 12:59 PM  Result Value Ref Range Status   MRSA, PCR POSITIVE (A) NEGATIVE Final    Comment: RESULT CALLED TO, READ BACK BY AND VERIFIED WITH: Burna Mortimer RN 15:40 06/27/17 ([email or web address])    Staphylococcus aureus POSITIVE (A) NEGATIVE Final    Comment: (NOTE) The Xpert SA Assay (FDA approved for NASAL specimens in patients 27 years of age and older), is  one component of a comprehensive surveillance program. It is not intended to diagnose infection nor to guide or monitor treatment. Performed at [facility], [place] [address] [place], [place] [address]      Studies: US Renal  Result Date: 06/29/2017 CLINICAL DATA:  Renal failure EXAM: RENAL / URINARY TRACT ULTRASOUND COMPLETE COMPARISON:  Abdomen ultrasound, 07/26/2016 FINDINGS: Right Kidney: Length: 11.7 cm. There is borderline increased parenchymal echogenicity. Mild cortical thinning. Lobulated renal contour. No masses or stones. No hydronephrosis. Left Kidney: Length: 12.8 cm. There is borderline increased parenchymal echogenicity. Mild cortical thinning. Lobulated renal contour. No masses or stones. No hydronephrosis. Bladder: Appears normal for degree of bladder distention. IMPRESSION: 1. No acute findings.  No hydronephrosis. 2. Borderline increased renal parenchymal echogenicity. Mild renal cortical thinning. Findings suggest medical renal disease. Electronically Signed   By: Lajean Manes M.D.   On: 06/29/2017 16:38    Scheduled Meds: . [MAR Hold] aspirin EC  325 mg Oral Daily  . [MAR Hold] baclofen  10 mg Oral TID  . [MAR Hold] busPIRone  10 mg Oral BID  . [MAR  Hold] Chlorhexidine Gluconate Cloth  6 each Topical Q0600  . [MAR Hold] FLUoxetine  20 mg Oral Daily  . [MAR Hold] fluticasone  2 spray Each Nare Daily  . [MAR Hold] insulin aspart  0-15 Units Subcutaneous TID WC  . [MAR Hold] insulin aspart  0-5 Units Subcutaneous QHS  . [MAR Hold] insulin aspart protamine- aspart  20 Units Subcutaneous BID WC  . [MAR Hold] metoprolol succinate  50 mg Oral Daily  . [MAR Hold] mupirocin ointment  1 application Nasal BID  . [MAR Hold] oxymetazoline  1 spray Each Nare BID  . [MAR Hold] rosuvastatin  40 mg Oral Daily  . [MAR Hold] senna-docusate  1 tablet Oral BID  . [MAR Hold] sodium bicarbonate  1,300 mg Oral BID  . [MAR Hold] vitamin C  1,000 mg Oral Daily    Continuous Infusions: . sodium chloride 10 mL/hr at 06/26/17 1829  . sodium chloride 10 mL/hr (06/29/17 1600)  . [MAR Hold] methocarbamol (ROBAXIN)  IV    . [MAR Hold] piperacillin-tazobactam (ZOSYN)  IV Stopped (06/30/17 1305)  . [MAR Hold] vancomycin 1,500 mg (06/30/17 1210)     Time spent: 52mins I have personally reviewed and interpreted on  06/30/2017 daily labs, tele strips, imagings as discussed above under date review session and assessment and plans.  I reviewed all nursing notes, pharmacy notes, consultant notes,  vitals, pertinent old records  I have discussed plan of care as described above with RN , patient on 06/30/2017   Florencia Reasons MD, PhD  [facility] Pager [phone]. If 7PM-7AM, please contact night-coverage at [email or web address], password [facility] 06/30/2017, 4:21 PM  LOS: 4 days

## 2017-06-30 NOTE — Anesthesia Procedure Notes (Signed)
Procedure Name: LMA Insertion Date/Time: 06/30/2017 3:23 PM Performed by: Clearnce Sorrel, CRNA Pre-anesthesia Checklist: Patient identified, Suction available, Emergency Drugs available, Patient being monitored and Timeout performed Patient Re-evaluated:Patient Re-evaluated prior to induction Oxygen Delivery Method: Circle system utilized Preoxygenation: Pre-oxygenation with 100% oxygen Induction Type: IV induction LMA: LMA inserted LMA Size: 4.0 Number of attempts: 1 Placement Confirmation: positive ETCO2 and breath sounds checked- equal and bilateral Tube secured with: Tape Dental Injury: Teeth and Oropharynx as per pre-operative assessment

## 2017-06-30 NOTE — Op Note (Signed)
NAMEMACINTYRE, ALEXA               ACCOUNT NO.:  [id number]  MEDICAL RECORD NO.:  [id number]  LOCATION:  [facility]                         FACILITY:  [facility]  PHYSICIAN:  Satira Anis. Brix Brearley, M.D.DATE OF BIRTH:  1965-08-16  DATE OF PROCEDURE: DATE OF DISCHARGE:                              OPERATIVE REPORT   PREOPERATIVE DIAGNOSIS:  Status post necrotizing fasciitis of the right forearm, wrist, and hand with history of 2 prior irrigation and debridements.  The patient presents for tenolysis, tenosynovectomy, debridement, and possible closure.  POSTOPERATIVE DIAGNOSIS:  Status post necrotizing fasciitis of the right forearm, wrist, and hand with history of 2 prior irrigation and debridements.  The patient presents for tenolysis, tenosynovectomy, debridement, and possible closure.  PROCEDURES: 1. Irrigation and debridement of skin, subcutaneous tissue, muscle,     and tendon.  This was an excisional debridement with curette,     knife, blade, and scissor. 2. Radical flexor tenolysis, tenosynovectomy of flexor digitorum     profundus, flexor digitorum superficialis tendons, as well as     flexor carpi radialis tendon. 3. Median nerve and ulnar nerve neurolysis with decompression of     hematoma around the structures, right forearm and wrist. 4. Complex closure, right forearm.  SURGEON:  Satira Anis. Amedeo Plenty, M.D.  ASSISTANT:  None.  COMPLICATIONS:  None.  ANESTHESIA:  General.  TOURNIQUET TIME:  Zero.  INDICATIONS:  This patient is a 52 year old male who presents with the above-mentioned diagnosis.  He is growing out MRSA.  He has had a necrotizing event to the fascia, which I have removed.  He has had 2 prior washouts and wound conditions have continued to improve.  Today, he presents for repeat I and D, possible closure, etc.  OPERATIVE PROCEDURE:  The patient was seen by myself and Anesthesia, taken to the operative theater, and underwent smooth induction of general anesthetic.   The bandage was carefully removed and wound conditions looked favorable.  We prepped him with 2 separate Hibiclens scrubs followed by a 10-minute surgical Betadine scrub and paint by myself.  Following this, the patient underwent evacuation of hematoma which had accumulated followed by debridement of skin, subcutaneous tissue, tendon, and muscle tissue. This was an excisional debridement with curette, knife, blade, and scissor.  Following this, I performed a median nerve neurolysis and ulnar nerve neurolysis with the associated ulnar artery.  These structures were removed from bloody hematoma and clot and any infectious or pre-necrotic tissue.  I then performed a radical tenolysis and tenosynovectomy of the flexor apparatus at the forearm, wrist region including the FCR, FDP to the index through small fingers, and FDS to the index through small fingers.  The patient tolerated this well.  Following this, we then performed 6 L of saline, irrigated through and through all areas.  I then placed a #10 TLS drain in the depths of the wound and closed the wound with far-near- near-far sutures of the 2-0 and 3-0 variety.  The patient closed well and the compartments were soft.  He had good refill and no complications.  Hopefully, the patient will continue to thrive and do well.  I do feel that he is the best I have  seen him and stable at this juncture.  We will plan to continue close postoperative measures in terms of his antibiotic treatment and regime.  This should be his last washout if parameters remain stable.  He was taken to the recovery room in stable condition.  All sponge, needle, and instrument counts were reported as correct.     Satira Anis. Amedeo Plenty, M.D.     [facility]  D:  06/30/2017  T:  06/30/2017  Job:  [id number]

## 2017-06-30 NOTE — Progress Notes (Signed)
Occupational Therapy Treatment Patient Details Name: Matthew [name] MRN: [id number] DOB: Nov 24, 1965 Today's Date: 06/30/2017    History of present illness 52 y.o. male with medical history significant of hypertension, diabetes, chronic kidney disease stage IV, chronic systolic congestive heart failure, history of nonsustained VT status post ICD, coronary artery disease status post CABG. Pt presented on 4/22 and found to have Right forearm erythema/cellulitis and worsening infection pattern with CT evidence of soft tissue, air, and abscess formation along the flexor carpi ulnaris. Pt is now s/p nerve release fasciectomy and tenotomy of the FCU tendon. Planning for repeat I&D on 4/25   OT comments  Pt. Able to return demo and complete RUE ROM with S.  Reviewed benefits of elevation for edema management.  Reviewed compensatory techniques for self care with non dominant L hand.  Note I & D later today.  Will cont. With current POC for ROM/edema management and increasing safety and independence with ADLs.     Follow Up Recommendations  Follow surgeon's recommendation for DC plan and follow-up therapies;Supervision - Intermittent    Equipment Recommendations  3 in 1 bedside commode    Recommendations for Other Services Other (comment)    Precautions / Restrictions Precautions Precautions: Fall Restrictions Weight Bearing Restrictions: No Other Position/Activity Restrictions: no formal order for RUE wt bearing at this time; elevate UE       Mobility Bed Mobility                  Transfers                      Balance                                           ADL either performed or assessed with clinical judgement   ADL                                 Toileting - Clothing Manipulation Details (indicate cue type and reason): reviewed compensatory tech. for pericare after BM as pt. is R hand dominant and does not currently have active  use.  discussed options for flushable t.paper also for thoroughness. he reports he is doing "ok" with use of L hand              Vision       Perception     Praxis      Cognition Arousal/Alertness: Lethargic Behavior During Therapy: WFL for tasks assessed/performed Overall Cognitive Status: Within Functional Limits for tasks assessed                                          Exercises General Exercises - Upper Extremity Shoulder Flexion: AROM;10 reps;Right;Seated Shoulder ABduction: AROM;10 reps;Right;Seated Shoulder ADduction: AROM;10 reps;Right;Seated Hand Exercises Digit Composite Flexion: AROM;AAROM;10 reps;Right;Seated Composite Extension: AROM;AAROM;Right;Seated   Shoulder Instructions       General Comments      Pertinent Vitals/ Pain       Pain Assessment: No/denies pain  Home Living  Prior Functioning/Environment              Frequency  Min 3X/week        Progress Toward Goals  OT Goals(current goals can now be found in the care plan section)  Progress towards OT goals: Progressing toward goals     Plan Discharge plan remains appropriate    Co-evaluation                 AM-PAC PT "6 Clicks" Daily Activity     Outcome Measure   Help from another person eating meals?: A Little Help from another person taking care of personal grooming?: A Little Help from another person toileting, which includes using toliet, bedpan, or urinal?: A Little Help from another person bathing (including washing, rinsing, drying)?: A Little Help from another person to put on and taking off regular upper body clothing?: A Little Help from another person to put on and taking off regular lower body clothing?: A Little 6 Click Score: 18    End of Session    OT Visit Diagnosis: Muscle weakness (generalized) (M62.81);Pain Pain - Right/Left: Right Pain - part of body: Arm;Hand    Activity Tolerance Patient tolerated treatment well   Patient Left in chair;with call bell/phone within reach   Nurse Communication          Time: 0951-1000 OT Time Calculation (min): 9 min  Charges: OT General Charges $OT Visit: 1 Visit OT Treatments $Therapeutic Exercise: 8-22 mins   Janice Coffin, COTA/L 06/30/2017, 10:17 AM

## 2017-06-30 NOTE — Progress Notes (Signed)
Pharmacy Antibiotic Note  Matthew [name] is a 52 y.o. male admitted on 06/25/2017 with cellulitis.  Pharmacy has been consulted for vancomycin dosing.   Abx for cellulitis / nec fasc. S/p I&D with significant surgical intervention. Cx growing MRSA. Ortho plans to return to OR on 4/27. Vanc random today was 13. Will restart scheduled dosing today.  Plan: Restart vancomycin 1,500mg  IV Q48h Change Zosyn to 3.375g IV q 12hr. Monitor kidney function and cultures  Consider stopping Zosyn as patient growing MRSA  Height: 5\' 9"  (175.3 cm) Weight: 255 lb (115.7 kg) IBW/kg (Calculated) : 70.7  Temp (24hrs), Avg:97.9 F (36.6 C), Min:97.7 F (36.5 C), Max:98.1 F (36.7 C)  Recent Labs  Lab 06/25/17 2149 06/26/17 1003 06/27/17 0558 06/28/17 0611 06/29/17 0657 06/30/17 0901  WBC  --  14.3* 13.8* 13.0* 11.6* 11.5*  CREATININE  --  4.53* 4.39* 4.59* 5.07* 5.49*  LATICACIDVEN 1.68  --   --   --   --   --   VANCORANDOM  --   --   --   --   --  13    Estimated Creatinine Clearance: 19.5 mL/min (A) (by C-G formula based on SCr of 5.49 mg/dL (H)).    Allergies  Allergen Reactions  . Ace Inhibitors Cough  . Cephalexin Other (See Comments)    Resistant to antibiotic  . Beta Adrenergic Blockers Cough    Antimicrobials this admission: 4/23 vancomycin >>  4/23 Zosyn >>   Dose adjustments this admission:   Microbiology results: 4/22 BCx: neg 4/23 R forearm abscess: MRSA 4/24 mrsa pcr: pos  Thank you for allowing pharmacy to be a part of this patient's care.  Reginia Naas 06/30/2017 10:11 AM

## 2017-06-30 NOTE — Progress Notes (Signed)
Matthew [name] NEPHROLOGY PROGRESS NOTE  Assessment/ Plan: 52 y.o. male.  Hypertension, diabetes, chronic kidney disease is stage IV follows at [facility], congestive heart failure, coronary artery disease status post CABG admitted with cellulitis of right forearm/necrotizing fasciitis status post surgery, consulted for worsening renal failure.  Assessment/Plan:  #Acute on chronic kidney disease stage IV likely hemodynamically mediated ? Hypotension during OR vs ATN related with vancomycin, ongoing infection. UA with bacteremia and 100 protein.  I -Ultrasound of kidneys with increased echogenicity, no hydronephrosis. -Serum creatinine level worsened to 5.4 today with sodium 128.  He has lower extremity edema.  I will discontinue sodium bicarbonate IV fluid and change to oral bicarb.  He is currently n.p.o. and going to the OR therefore will not start on diuretics.  Monitor kidney function.  Avoid hypotensive episode or nephrotoxins.  No plan for dialysis today. -Strict ins and out. -Check urine electrolytes and urine osmolality.  #Metabolic acidosis: CO2 mildly improving to 18 today.  Changing to oral sodium bicarbonate.  Monitor.  #Anemia multifactorial: Current decrease in hemoglobin likely due to blood loss during OR.  Iron stores acceptable.  #Hypertension: Blood pressure acceptable.  Currently on metoprolol.  #Necrotizing fasciitis of right forearm and wrist: Plan for another OR procedure today.  Per orthopedics.   Subjective: Seen and examined at bedside.  Denies headache, dizziness, nausea vomiting chest pain shortness of breath.  Currently n.p.o. Objective Vital signs in last 24 hours: Vitals:   06/29/17 1100 06/29/17 2144 06/30/17 0725 06/30/17 1100  BP: 130/65 127/62 114/64 131/61  Pulse: 74 79 72 75  Resp: 20   20  Temp: 98 F (36.7 C) 98.1 F (36.7 C) 97.7 F (36.5 C) 98 F (36.7 C)  TempSrc: Oral Oral Oral Oral  SpO2: 96% 96% 97% 97%  Weight:       Height:       Weight change:   Intake/Output Summary (Last 24 hours) at 06/30/2017 1232 Last data filed at 06/30/2017 0905 Gross per 24 hour  Intake 1613.17 ml  Output 1050 ml  Net 563.17 ml       Labs: Basic Metabolic Panel: Recent Labs  Lab 06/28/17 0611 06/29/17 0657 06/30/17 0901  NA 134* 133* 128*  K 3.9 4.0 3.6  CL 105 106 97*  CO2 16* 15* 18*  GLUCOSE 172* 212* 138*  BUN 70* 74* 77*  CREATININE 4.59* 5.07* 5.49*  CALCIUM 8.9 8.5* 8.6*   Liver Function Tests: No results for input(s): AST, ALT, ALKPHOS, BILITOT, PROT, ALBUMIN in the last 168 hours. No results for input(s): LIPASE, AMYLASE in the last 168 hours. No results for input(s): AMMONIA in the last 168 hours. CBC: Recent Labs  Lab 06/25/17 2104 06/26/17 1003 06/27/17 0558 06/28/17 0611 06/29/17 0657 06/30/17 0901  WBC 16.5* 14.3* 13.8* 13.0* 11.6* 11.5*  NEUTROABS 14.1*  --   --   --   --   --   HGB 11.6* 10.8* 10.7* 10.3* 8.6* 7.7*  HCT 34.2* 31.3* 30.8* 31.0* 26.8* 23.4*  MCV 82.6 82.6 83.5 84.0 85.6 85.1  PLT 134* 126* 131* 157 158 163   Cardiac Enzymes: No results for input(s): CKTOTAL, CKMB, CKMBINDEX, TROPONINI in the last 168 hours. CBG: Recent Labs  Lab 06/29/17 0635 06/29/17 1224 06/29/17 1842 06/29/17 2148 06/30/17 0715  GLUCAP 193* 241* 334* 202* 103*    Iron Studies:  Recent Labs    06/30/17 0901  IRON 66  TIBC 182*  FERRITIN 370*   Studies/Results: US Renal  Result Date: 06/29/2017 CLINICAL DATA:  Renal failure EXAM: RENAL / URINARY TRACT ULTRASOUND COMPLETE COMPARISON:  Abdomen ultrasound, 07/26/2016 FINDINGS: Right Kidney: Length: 11.7 cm. There is borderline increased parenchymal echogenicity. Mild cortical thinning. Lobulated renal contour. No masses or stones. No hydronephrosis. Left Kidney: Length: 12.8 cm. There is borderline increased parenchymal echogenicity. Mild cortical thinning. Lobulated renal contour. No masses or stones. No hydronephrosis. Bladder:  Appears normal for degree of bladder distention. IMPRESSION: 1. No acute findings.  No hydronephrosis. 2. Borderline increased renal parenchymal echogenicity. Mild renal cortical thinning. Findings suggest medical renal disease. Electronically Signed   By: Matthew [name] M.D.   On: 06/29/2017 16:38    Medications: Infusions: . sodium chloride 10 mL/hr at 06/26/17 1829  . sodium chloride 10 mL/hr (06/29/17 1600)  . methocarbamol (ROBAXIN)  IV    . piperacillin-tazobactam (ZOSYN)  IV 3.375 g (06/30/17 0905)  . vancomycin 1,500 mg (06/30/17 1210)    Scheduled Medications: . aspirin EC  325 mg Oral Daily  . baclofen  10 mg Oral TID  . busPIRone  10 mg Oral BID  . Chlorhexidine Gluconate Cloth  6 each Topical Q0600  . FLUoxetine  20 mg Oral Daily  . fluticasone  2 spray Each Nare Daily  . insulin aspart  0-15 Units Subcutaneous TID WC  . insulin aspart  0-5 Units Subcutaneous QHS  . insulin aspart protamine- aspart  20 Units Subcutaneous BID WC  . metoprolol succinate  50 mg Oral Daily  . mupirocin ointment  1 application Nasal BID  . oxymetazoline  1 spray Each Nare BID  . rosuvastatin  40 mg Oral Daily  . senna-docusate  1 tablet Oral BID  . sodium bicarbonate  1,300 mg Oral BID  . vitamin C  1,000 mg Oral Daily    have reviewed scheduled and prn medications.  Physical Exam: General:NAD, comfortable Heart:RRR, s1s2 nl Lungs:clear b/l, no cracjle Abdomen:soft, Non-tender, non-distended Extremities: Bilateral lower extremities pitting edema to    [employer] 06/30/2017,12:32 PM  LOS: 4 days

## 2017-06-30 NOTE — Consult Note (Signed)
Patient's white count is trending down nicely.  He is 11,000.  His cultures have grown out MRSA.  He of course has a necrotizing fasciitis of the into the forearm.  This is his third washout.  We will plan for I&D repair structures as necessary and possible wound closure today.  He understands all issues and desires to proceed.  We are planning surgery for your upper extremity. The risk and benefits of surgery to include risk of bleeding, infection, anesthesia,  damage to normal structures and failure of the surgery to accomplish its intended goals of relieving symptoms and restoring function have been discussed in detail. With this in mind we plan to proceed. I have specifically discussed with the patient the pre-and postoperative regime and the dos and don'ts and risk and benefits in great detail. Risk and benefits of surgery also include risk of dystrophy(CRPS), chronic nerve pain, failure of the healing process to go onto completion and other inherent risks of surgery The relavent the pathophysiology of the disease/injury process, as well as the alternatives for treatment and postoperative course of action has been discussed in great detail with the patient who desires to proceed.  We will do everything in our power to help you (the patient) restore function to the upper extremity. It is a pleasure to see this patient today.   Rosa Wyly MD

## 2017-07-01 ENCOUNTER — Encounter (HOSPITAL_COMMUNITY): Payer: Self-pay | Admitting: Orthopedic Surgery

## 2017-07-01 LAB — GLUCOSE, CAPILLARY
GLUCOSE-CAPILLARY: 141 mg/dL — AB (ref 65–99)
GLUCOSE-CAPILLARY: 181 mg/dL — AB (ref 65–99)
Glucose-Capillary: 195 mg/dL — ABNORMAL HIGH (ref 65–99)
Glucose-Capillary: 154 mg/dL — ABNORMAL HIGH (ref 65–99)
Glucose-Capillary: 173 mg/dL — ABNORMAL HIGH (ref 65–99)
Glucose-Capillary: 209 mg/dL — ABNORMAL HIGH (ref 65–99)

## 2017-07-01 LAB — HEPATIC FUNCTION PANEL
ALBUMIN: 2.3 g/dL — AB (ref 3.5–5.0)
ALT: 47 U/L (ref 17–63)
AST: 29 U/L (ref 15–41)
Alkaline Phosphatase: 102 U/L (ref 38–126)
BILIRUBIN TOTAL: 1 mg/dL (ref 0.3–1.2)
Bilirubin, Direct: <0.1 mg/dL — ABNORMAL LOW (ref 0.1–0.5)
Total Protein: 6.2 g/dL — ABNORMAL LOW (ref 6.5–8.1)

## 2017-07-01 LAB — BASIC METABOLIC PANEL
Anion gap: 11 (ref 5–15)
BUN: 77 mg/dL — ABNORMAL HIGH (ref 6–20)
CALCIUM: 8.8 mg/dL — AB (ref 8.9–10.3)
CO2: 18 mmol/L — AB (ref 22–32)
CREATININE: 5.56 mg/dL — AB (ref 0.61–1.24)
Chloride: 101 mmol/L (ref 101–111)
GFR, EST AFRICAN AMERICAN: 12 mL/min — AB (ref >60)
GFR, EST NON AFRICAN AMERICAN: 11 mL/min — AB (ref >60)
Glucose, Bld: 185 mg/dL — ABNORMAL HIGH (ref 65–99)
Potassium: 3.9 mmol/L (ref 3.5–5.1)
Sodium: 130 mmol/L — ABNORMAL LOW (ref 135–145)

## 2017-07-01 LAB — CBC
HCT: 23.1 % — ABNORMAL LOW (ref 39.0–52.0)
Hemoglobin: 7.7 g/dL — ABNORMAL LOW (ref 13.0–17.0)
MCH: 27.9 pg (ref 26.0–34.0)
MCHC: 33.3 g/dL (ref 30.0–36.0)
MCV: 83.7 fL (ref 78.0–100.0)
Platelets: 159 10*3/uL (ref 150–400)
RBC: 2.76 MIL/uL — AB (ref 4.22–5.81)
RDW: 14.9 % (ref 11.5–15.5)
WBC: 11 10*3/uL — ABNORMAL HIGH (ref 4.0–10.5)

## 2017-07-01 LAB — CK: CK TOTAL: 52 U/L (ref 49–397)

## 2017-07-01 LAB — AEROBIC/ANAEROBIC CULTURE W GRAM STAIN (SURGICAL/DEEP WOUND)

## 2017-07-01 LAB — NA AND K (SODIUM & POTASSIUM), RAND UR: Potassium Urine: 14 mmol/L

## 2017-07-01 LAB — HEMOGLOBIN A1C
HEMOGLOBIN A1C: 9.3 % — AB (ref 4.8–5.6)
MEAN PLASMA GLUCOSE: 220.21 mg/dL

## 2017-07-01 LAB — OSMOLALITY, URINE: OSMOLALITY UR: 178 mosm/kg — AB (ref 300–900)

## 2017-07-01 LAB — AEROBIC/ANAEROBIC CULTURE (SURGICAL/DEEP WOUND)

## 2017-07-01 NOTE — Progress Notes (Signed)
Patient ID: Matthew [name], male   DOB: 18-Aug-1965, 52 y.o.   MRN: [id number] Patient is doing well at bedside.  He is alert and oriented.  Vital signs are stable.  His white blood cell count is down to 11,000.  He is growing out staph aureus.  This is MRSA.  His exam shows that his hand is intact to sensation and vascular exam.  There is no signs of compartment syndrome.  He has no evidence of instability about the fingers.  We closed his wound yesterday over drain.  I discussed with nursing staff read taught drain tube changes every 4 hours until he is DC'd.  Upon DC I would like the drain to be removed.  From my standpoint he may be DC'd from the hospital on doxycycline for 4 weeks 100 mg twice daily given the sensitivities.  I would like to see him back Thursday in the office for dressing change.  I discussed these issues with him at length and the findings.  He understands to keep the bandage clean and dry until I see him and to not remove it.  He understands the importance of elevation and staying on a p.o. antibiotic regime for 4 weeks.  Decarlo Rivet MD cell phone [phone]  [phone]

## 2017-07-01 NOTE — Progress Notes (Signed)
PROGRESS NOTE    Matthew [name]  KZS:[id number] DOB: 1966/01/01 DOA: 06/25/2017  PCP: [name], MD   Brief Narrative:  52 y.o. male with medical history significant for hypertension, diabetes, chronic kidney disease stage IV, chronic systolic congestive heart failure, history of nonsustained VT status post ICD, coronary artery disease status post CABG. Pt presented with 1 week of erythema of right wrist along with decreased grip and weakness. He went to urgent care and was given an injection of antibiotics and started on a oral course of doxycycline. Cellulitis progressively worsened.    Assessment & Plan:   Principal Problem:   Cellulitis, right forearm / Leukocytosis  - S/P nerve release fasciectomy and tenotomy of the FCU tendon. Per ortho, pt fascia was necrotic and he had massive amounts of thick purulent fluid and required an extensive debridement 4/23. The wound was left open.  - S/P I&D repeat right forearm tenosynovectomy 4/25 and 4/27 - Stopped vanco 4/25  - Continue zosyn and switch to doxycycline for 4 weeks 100 mg BID on discharge. Per ortho, pt okay for dc but his creatine is worsening which is preventing him from going home today   Active Problems:   HYPERTENSION, BENIGN - Continue metoprolol  - BP stable     Chronic systolic heart failure (HCC) - Reasonably well compensated     Anxiety and depression - Continue Prozac, Buspar    Dyslipidemia - Continue crestor     CKD stage 4 due to type 2 diabetes mellitus ([place]) - Follow up daily BMP - Cr in 03/2017 as high as 3.09 - Cr on admission 4.54 - Cr trending up, likely due to vanco - Seen by renal in consultation, plan to monitor intake and outptu and check electrolytes - Renal US showed no acute findings   DVT prophylaxis: SCD's Code Status: full code  Family Communication: no family at the bedside Disposition Plan: home once cleared by ortho and also provided his renal function stabilizes,  currently Cr continues to trend up    Consultants:   Ortho  Nephrology   Procedures:  I&D 4/23, 4/25, 4/28  Antimicrobials:   Vanco 4/22 --> 4/26   Zosyn 4/22 -->   Subjective: No overnight events.  Objective: Vitals:   06/30/17 1929 06/30/17 2319 07/01/17 0517 07/01/17 1300  BP: 122/68 124/75 135/72 126/63  Pulse: 82 77 79 72  Resp:    16  Temp: 97.7 F (36.5 C) 97.6 F (36.4 C) 97.8 F (36.6 C) 98 F (36.7 C)  TempSrc: Oral Oral Oral Oral  SpO2: 98% 99% 97% 98%  Weight:      Height:        Intake/Output Summary (Last 24 hours) at 07/01/2017 1423 Last data filed at 07/01/2017 0900 Gross per 24 hour  Intake 1807.33 ml  Output 475 ml  Net 1332.33 ml   Filed Weights   06/25/17 2049  Weight: 115.7 kg (255 lb)    Physical Exam  Constitutional: Appears well-developed and well-nourished. No distress.  Eyes: Conjunctivae and EOM are normal. PERRLA, no scleral icterus.  Neck: Normal ROM. Neck supple. No JVD. No tracheal deviation. No thyromegaly.  CVS: RRR, S1/S2 + Pulmonary: Effort and breath sounds normal, no stridor, rhonchi, wheezes, rales.  Abdominal: Soft. BS +,  no distension, tenderness, rebound or guarding.  Musculoskeletal: Normal range of motion in LE. No edema and no tenderness in LE  Lymphadenopathy: No lymphadenopathy noted, cervical, inguinal. Neuro: Alert. Normal reflexes, muscle tone coordination. No cranial nerve  deficit. Skin: Right forearm dressing in palce Psychiatric: Normal mood and affect. Behavior, judgment, thought content normal.     Data Reviewed: I have personally reviewed following labs and imaging studies  CBC: Recent Labs  Lab 06/25/17 2104  06/27/17 0558 06/28/17 0611 06/29/17 0657 06/30/17 0901 07/01/17 0530  WBC 16.5*   < > 13.8* 13.0* 11.6* 11.5* 11.0*  NEUTROABS 14.1*  --   --   --   --   --   --   HGB 11.6*   < > 10.7* 10.3* 8.6* 7.7* 7.7*  HCT 34.2*   < > 30.8* 31.0* 26.8* 23.4* 23.1*  MCV 82.6   < > 83.5  84.0 85.6 85.1 83.7  PLT 134*   < > 131* 157 158 163 159   < > = values in this interval not displayed.   Basic Metabolic Panel: Recent Labs  Lab 06/27/17 0558 06/28/17 0611 06/29/17 0657 06/30/17 0901 07/01/17 0530  NA 130* 134* 133* 128* 130*  K 3.7 3.9 4.0 3.6 3.9  CL 102 105 106 97* 101  CO2 13* 16* 15* 18* 18*  GLUCOSE 160* 172* 212* 138* 185*  BUN 70* 70* 74* 77* 77*  CREATININE 4.39* 4.59* 5.07* 5.49* 5.56*  CALCIUM 8.9 8.9 8.5* 8.6* 8.8*   GFR: Estimated Creatinine Clearance: 19.3 mL/min (A) (by C-G formula based on SCr of 5.56 mg/dL (H)). Liver Function Tests: Recent Labs  Lab 07/01/17 0530  AST 29  ALT 47  ALKPHOS 102  BILITOT 1.0  PROT 6.2*  ALBUMIN 2.3*   No results for input(s): LIPASE, AMYLASE in the last 168 hours. No results for input(s): AMMONIA in the last 168 hours. Coagulation Profile: No results for input(s): INR, PROTIME in the last 168 hours. Cardiac Enzymes: No results for input(s): CKTOTAL, CKMB, CKMBINDEX, TROPONINI in the last 168 hours. BNP (last 3 results) No results for input(s): PROBNP in the last 8760 hours. HbA1C: Recent Labs    07/01/17 0530  HGBA1C 9.3*   CBG: Recent Labs  Lab 06/30/17 1639 06/30/17 1749 07/01/17 0104 07/01/17 0610 07/01/17 1240  GLUCAP 139* 155* 195* 181* 154*   Lipid Profile: No results for input(s): CHOL, HDL, LDLCALC, TRIG, CHOLHDL, LDLDIRECT in the last 72 hours. Thyroid Function Tests: No results for input(s): TSH, T4TOTAL, FREET4, T3FREE, THYROIDAB in the last 72 hours. Anemia Panel: Recent Labs    06/30/17 0901  FERRITIN 370*  TIBC 182*  IRON 66   Urine analysis:    Component Value Date/Time   COLORURINE YELLOW 06/25/2017 2110   APPEARANCEUR HAZY (A) 06/25/2017 2110   LABSPEC 1.005 06/25/2017 2110   PHURINE 5.0 06/25/2017 2110   GLUCOSEU 50 (A) 06/25/2017 2110   HGBUR MODERATE (A) 06/25/2017 2110   BILIRUBINUR NEGATIVE 06/25/2017 2110   KETONESUR NEGATIVE 06/25/2017 2110    PROTEINUR 100 (A) 06/25/2017 2110   UROBILINOGEN 1.0 08/31/2009 1526   NITRITE NEGATIVE 06/25/2017 2110   LEUKOCYTESUR NEGATIVE 06/25/2017 2110   Sepsis Labs: @LABRCNTIP (procalcitonin:4,lacticidven:4)    Recent Results (from the past 240 hour(s))  Culture, blood (routine x 2)     Status: None   Collection Time: 06/25/17  9:04 PM  Result Value Ref Range Status   Specimen Description BLOOD RIGHT ANTECUBITAL  Final   Special Requests   Final    BOTTLES DRAWN AEROBIC AND ANAEROBIC Blood Culture adequate volume   Culture   Final    NO GROWTH 5 DAYS Performed at [facility], [place] [address], [place], [place]  [address]    Report Status 06/30/2017 FINAL  Final  Aerobic/Anaerobic Culture (surgical/deep wound)     Status: None   Collection Time: 06/26/17  8:35 PM  Result Value Ref Range Status   Specimen Description ABSCESS RIGHT FOREARM  Final   Special Requests PATIENT ON FOLLOWING ZOSYN AND VANC  Final   Gram Stain   Final    ABUNDANT WBC PRESENT, PREDOMINANTLY PMN FEW GRAM POSITIVE COCCI    Culture   Final    FEW METHICILLIN RESISTANT STAPHYLOCOCCUS AUREUS NO ANAEROBES ISOLATED Performed at [facility], [place] [address], [place], [place] [address]    Report Status 07/01/2017 FINAL  Final   Organism ID, Bacteria METHICILLIN RESISTANT STAPHYLOCOCCUS AUREUS  Final      Susceptibility   Methicillin resistant staphylococcus aureus - MIC*    CIPROFLOXACIN >=8 RESISTANT Resistant     ERYTHROMYCIN >=8 RESISTANT Resistant     GENTAMICIN <=0.5 SENSITIVE Sensitive     OXACILLIN >=4 RESISTANT Resistant     TETRACYCLINE <=1 SENSITIVE Sensitive     VANCOMYCIN 1 SENSITIVE Sensitive     TRIMETH/SULFA <=10 SENSITIVE Sensitive     CLINDAMYCIN <=0.25 SENSITIVE Sensitive     RIFAMPIN <=0.5 SENSITIVE Sensitive     Inducible Clindamycin NEGATIVE Sensitive     * FEW METHICILLIN RESISTANT STAPHYLOCOCCUS AUREUS  Surgical pcr screen     Status: Abnormal   Collection Time: 06/27/17  12:59 PM  Result Value Ref Range Status   MRSA, PCR POSITIVE (A) NEGATIVE Final    Comment: RESULT CALLED TO, READ BACK BY AND VERIFIED WITH: Burna Mortimer RN 15:40 06/27/17 ([email or web address])    Staphylococcus aureus POSITIVE (A) NEGATIVE Final    Comment: (NOTE) The Xpert SA Assay (FDA approved for NASAL specimens in patients 11 years of age and older), is one component of a comprehensive surveillance program. It is not intended to diagnose infection nor to guide or monitor treatment. Performed at [facility], [place] [address] [place], [place] [address]       Radiology Studies: US Renal  Result Date: 06/29/2017 CLINICAL DATA:  Renal failure EXAM: RENAL / URINARY TRACT ULTRASOUND COMPLETE COMPARISON:  Abdomen ultrasound, 07/26/2016 FINDINGS: Right Kidney: Length: 11.7 cm. There is borderline increased parenchymal echogenicity. Mild cortical thinning. Lobulated renal contour. No masses or stones. No hydronephrosis. Left Kidney: Length: 12.8 cm. There is borderline increased parenchymal echogenicity. Mild cortical thinning. Lobulated renal contour. No masses or stones. No hydronephrosis. Bladder: Appears normal for degree of bladder distention. IMPRESSION: 1. No acute findings.  No hydronephrosis. 2. Borderline increased renal parenchymal echogenicity. Mild renal cortical thinning. Findings suggest medical renal disease. Electronically Signed   By: Lajean Manes M.D.   On: 06/29/2017 16:38      Scheduled Meds: . aspirin EC  325 mg Oral Daily  . baclofen  20 mg Oral TID  . busPIRone  10 mg Oral BID  . FLUoxetine  20 mg Oral Daily  . fluticasone  2 spray Each Nare Daily  . insulin aspart  0-15 Units Subcutaneous TID WC  . insulin aspart  0-5 Units Subcutaneous QHS  . insulin aspart protamine- aspart  20 Units Subcutaneous BID WC  . metoprolol succinate  50 mg Oral Daily  . mupirocin ointment  1 application Nasal BID  . oxymetazoline  1 spray Each Nare BID  . rosuvastatin  40 mg Oral  Daily  . senna-docusate  1 tablet Oral BID  . vitamin C  1,000 mg Oral Daily  Continuous Infusions: . methocarbamol (ROBAXIN)  IV    . piperacillin-tazobactam (ZOSYN)  IV Stopped (06/27/17 2117)  . [START ON 06/28/2017] vancomycin       LOS: 5 days    Time spent: 25 minutes Greater than 50% of the time spent on counseling and coordinating the care.   Leisa Lenz, MD [facility] Pager [phone]  If 7PM-7AM, please contact night-coverage [email or web address] Password [facility] 07/01/2017, 2:23 PM

## 2017-07-01 NOTE — Plan of Care (Signed)
  Problem: Education: Goal: Knowledge of General Education information will improve Outcome: Progressing   Problem: Health Behavior/Discharge Planning: Goal: Ability to manage health-related needs will improve Outcome: Progressing   Problem: Clinical Measurements: Goal: Ability to maintain clinical measurements within normal limits will improve Outcome: Progressing Goal: Will remain free from infection Outcome: Progressing Goal: Diagnostic test results will improve Outcome: Progressing Goal: Respiratory complications will improve Outcome: Progressing Goal: Cardiovascular complication will be avoided Outcome: Progressing   Problem: Activity: Goal: Risk for activity intolerance will decrease Outcome: Progressing   Problem: Nutrition: Goal: Adequate nutrition will be maintained Outcome: Progressing   Problem: Coping: Goal: Level of anxiety will decrease Outcome: Progressing   Problem: Elimination: Goal: Will not experience complications related to bowel motility Outcome: Progressing Goal: Will not experience complications related to urinary retention Outcome: Progressing   Problem: Pain Managment: Goal: General experience of comfort will improve Outcome: Progressing   Problem: Safety: Goal: Ability to remain free from injury will improve Outcome: Progressing   Problem: Skin Integrity: Goal: Risk for impaired skin integrity will decrease Outcome: Progressing   Problem: Clinical Measurements: Goal: Postoperative complications will be avoided or minimized Outcome: Progressing   Problem: Skin Integrity: Goal: Demonstration of wound healing without infection will improve Outcome: Progressing   Problem: Clinical Measurements: Goal: Ability to avoid or minimize complications of infection will improve Outcome: Progressing   Problem: Skin Integrity: Goal: Skin integrity will improve Outcome: Progressing

## 2017-07-01 NOTE — Progress Notes (Signed)
[facility] NEPHROLOGY PROGRESS NOTE  Assessment/ Plan: 52 y.o. male.  Hypertension, diabetes, chronic kidney disease is stage IV follows at [facility], congestive heart failure, coronary artery disease status post CABG admitted with cellulitis of right forearm/necrotizing fasciitis status post surgery, consulted for worsening renal failure.  Assessment/Plan:  #Acute on chronic kidney disease stage IV likely hemodynamically mediated ? Hypotension during OR vs ATN related with vancomycin/ infection.  Patient went to water 3 times.  UA with bacteremia and 100 protein.    Check  CK level. -Ultrasound of kidneys with increased echogenicity, no hydronephrosis. -Serum creatinine level 5.56 today. Pt reported no measurement of urine output.  He reported good urine output.  He has trace lower extremity edema.  I will continue sodium bicarbonate and monitor kidney function. -He is not hypoxic and reporting no uremic symptoms.  Potassium 3.9.  No indication for dialysis. -Strict ins and out.  #Metabolic acidosis: CO2 18.  Continue sodium bicarbonate.  #Anemia multifactorial: Current decrease in hemoglobin likely due to blood loss during OR.  Iron stores acceptable.  Hemoglobin 7.7.  #Hypertension: Blood pressure acceptable.  Currently on metoprolol.  #Necrotizing fasciitis of right forearm and wrist: Patient underwent surgical procedure by orthopedics.  Currently on antibiotics.  Subjective: Seen and examined at bedside.  Denied nausea vomiting chest pain or shortness of breath.  Reported no urinary complaints.  Objective Vital signs in last 24 hours: Vitals:   06/30/17 1929 06/30/17 2319 07/01/17 0517 07/01/17 1300  BP: 122/68 124/75 135/72 126/63  Pulse: 82 77 79 72  Resp:    16  Temp: 97.7 F (36.5 C) 97.6 F (36.4 C) 97.8 F (36.6 C) 98 F (36.7 C)  TempSrc: Oral Oral Oral Oral  SpO2: 98% 99% 97% 98%  Weight:      Height:       Weight change:   Intake/Output  Summary (Last 24 hours) at 07/01/2017 1316 Last data filed at 07/01/2017 0900 Gross per 24 hour  Intake 1807.33 ml  Output 475 ml  Net 1332.33 ml       Labs: Basic Metabolic Panel: Recent Labs  Lab 06/29/17 0657 06/30/17 0901 07/01/17 0530  NA 133* 128* 130*  K 4.0 3.6 3.9  CL 106 97* 101  CO2 15* 18* 18*  GLUCOSE 212* 138* 185*  BUN 74* 77* 77*  CREATININE 5.07* 5.49* 5.56*  CALCIUM 8.5* 8.6* 8.8*   Liver Function Tests: Recent Labs  Lab 07/01/17 0530  AST 29  ALT 47  ALKPHOS 102  BILITOT 1.0  PROT 6.2*  ALBUMIN 2.3*   No results for input(s): LIPASE, AMYLASE in the last 168 hours. No results for input(s): AMMONIA in the last 168 hours. CBC: Recent Labs  Lab 06/25/17 2104  06/27/17 0558 06/28/17 0611 06/29/17 0657 06/30/17 0901 07/01/17 0530  WBC 16.5*   < > 13.8* 13.0* 11.6* 11.5* 11.0*  NEUTROABS 14.1*  --   --   --   --   --   --   HGB 11.6*   < > 10.7* 10.3* 8.6* 7.7* 7.7*  HCT 34.2*   < > 30.8* 31.0* 26.8* 23.4* 23.1*  MCV 82.6   < > 83.5 84.0 85.6 85.1 83.7  PLT 134*   < > 131* 157 158 163 159   < > = values in this interval not displayed.   Cardiac Enzymes: No results for input(s): CKTOTAL, CKMB, CKMBINDEX, TROPONINI in the last 168 hours. CBG: Recent Labs  Lab 06/30/17 1639 06/30/17 1749  07/01/17 0104 07/01/17 0610 07/01/17 1240  GLUCAP 139* 155* 195* 181* 154*    Iron Studies:  Recent Labs    06/30/17 0901  IRON 66  TIBC 182*  FERRITIN 370*   Studies/Results: US Renal  Result Date: 06/29/2017 CLINICAL DATA:  Renal failure EXAM: RENAL / URINARY TRACT ULTRASOUND COMPLETE COMPARISON:  Abdomen ultrasound, 07/26/2016 FINDINGS: Right Kidney: Length: 11.7 cm. There is borderline increased parenchymal echogenicity. Mild cortical thinning. Lobulated renal contour. No masses or stones. No hydronephrosis. Left Kidney: Length: 12.8 cm. There is borderline increased parenchymal echogenicity. Mild cortical thinning. Lobulated renal contour.  No masses or stones. No hydronephrosis. Bladder: Appears normal for degree of bladder distention. IMPRESSION: 1. No acute findings.  No hydronephrosis. 2. Borderline increased renal parenchymal echogenicity. Mild renal cortical thinning. Findings suggest medical renal disease. Electronically Signed   By: Lajean Manes M.D.   On: 06/29/2017 16:38    Medications: Infusions: . sodium chloride 10 mL/hr at 06/26/17 1829  . sodium chloride 10 mL/hr (06/29/17 1600)  . methocarbamol (ROBAXIN)  IV    . piperacillin-tazobactam (ZOSYN)  IV Stopped (07/01/17 1205)  . vancomycin 1,500 mg (06/30/17 1210)    Scheduled Medications: . aspirin EC  325 mg Oral Daily  . baclofen  10 mg Oral TID  . busPIRone  10 mg Oral BID  . Chlorhexidine Gluconate Cloth  6 each Topical Q0600  . FLUoxetine  20 mg Oral Daily  . fluticasone  2 spray Each Nare Daily  . insulin aspart  0-15 Units Subcutaneous TID WC  . insulin aspart  0-5 Units Subcutaneous QHS  . insulin aspart protamine- aspart  20 Units Subcutaneous BID WC  . metoprolol succinate  50 mg Oral Daily  . mupirocin ointment  1 application Nasal BID  . oxymetazoline  1 spray Each Nare BID  . rosuvastatin  40 mg Oral Daily  . senna-docusate  1 tablet Oral BID  . sodium bicarbonate  1,300 mg Oral BID  . vitamin C  1,000 mg Oral Daily    have reviewed scheduled and prn medications.  Physical Exam: General: Not in distress, comfortable Heart: Regular rate rhythm S1-S2 normal Lungs: Clear bilateral, no crackles or wheezing Abdomen:soft, Non-tender, non-distended Extremities: Bilateral lower extremity trace edema.    Almir Botts Prasad Denzil Mceachron 07/01/2017,1:16 PM  LOS: 5 days

## 2017-07-01 NOTE — Progress Notes (Signed)
Occupational Therapy Treatment Patient Details Name: Matthew [name] MRN: [id number] DOB: July 02, 1965 Today's Date: 07/01/2017    History of present illness 52 y.o. male with medical history significant of hypertension, diabetes, chronic kidney disease stage IV, chronic systolic congestive heart failure, history of nonsustained VT status post ICD, coronary artery disease status post CABG. Pt presented on 4/22 and found to have Right forearm erythema/cellulitis and worsening infection pattern with CT evidence of soft tissue, air, and abscess formation along the flexor carpi ulnaris. Pt is now s/p nerve release fasciectomy and tenotomy of the FCU tendon. Planning for repeat I&D on 4/25   OT comments  Pt. Reports feeling well this am.  Able to return demo of ROM for RUE with focus on edema management.  Amb. To/from b.room for toileting tasks with min guard a. States he feels the RW gives better support and balance during amb.  Reports he is eager for d/c home when able.  Will cont. Current poc with focus on safety and independence with adls utilizing compensatory strategies for RUE prn.    Follow Up Recommendations  Follow surgeon's recommendation for DC plan and follow-up therapies;Supervision - Intermittent    Equipment Recommendations  3 in 1 bedside commode    Recommendations for Other Services      Precautions / Restrictions Precautions Precautions: Fall Restrictions Other Position/Activity Restrictions: no formal order for RUE wt bearing at this time; elevate UE       Mobility Bed Mobility               General bed mobility comments: seated in recliner at beginning and end of session  Transfers Overall transfer level: Needs assistance Equipment used: Rolling walker (2 wheeled) Transfers: Sit to/from [facility] Sit to Stand: Min guard Stand pivot transfers: Min guard       General transfer comment: MinGuard for safety; pt needs consistent cues for safe  UE placement when completing sit<>stand     Balance                                           ADL either performed or assessed with clinical judgement   ADL Overall ADL's : Needs assistance/impaired                         Toilet Transfer: Min guard;Ambulation;Regular Toilet;RW Toilet Transfer Details (indicate cue type and reason): verbal cues for hand placement during transfer  Oppelo and Hygiene: Min guard;Sit to/from stand;Sitting/lateral lean       Functional mobility during ADLs: Min guard;Rolling walker       Vision       Perception     Praxis      Cognition Arousal/Alertness: Awake/alert Behavior During Therapy: WFL for tasks assessed/performed Overall Cognitive Status: Within Functional Limits for tasks assessed                                          Exercises General Exercises - Upper Extremity Shoulder Flexion: AROM;10 reps;Right;Seated Shoulder ABduction: AROM;10 reps;Right;Seated Shoulder ADduction: AROM;10 reps;Right;Seated Hand Exercises Digit Composite Flexion: AROM;AAROM;10 reps;Right;Seated Composite Extension: AROM;AAROM;Right;Seated   Shoulder Instructions       General Comments      Pertinent Vitals/ Pain  Pain Assessment: No/denies pain  Home Living                                          Prior Functioning/Environment              Frequency  Min 3X/week        Progress Toward Goals  OT Goals(current goals can now be found in the care plan section)  Progress towards OT goals: Progressing toward goals     Plan Discharge plan remains appropriate    Co-evaluation                 AM-PAC PT "6 Clicks" Daily Activity     Outcome Measure   Help from another person eating meals?: A Little Help from another person taking care of personal grooming?: A Little Help from another person toileting, which includes using  toliet, bedpan, or urinal?: A Little Help from another person bathing (including washing, rinsing, drying)?: A Little Help from another person to put on and taking off regular upper body clothing?: A Little Help from another person to put on and taking off regular lower body clothing?: A Little 6 Click Score: 18    End of Session Equipment Utilized During Treatment: Rolling walker;Gait belt  OT Visit Diagnosis: Muscle weakness (generalized) (M62.81);Pain Pain - part of body: Arm;Hand   Activity Tolerance Patient tolerated treatment well   Patient Left in chair;with call bell/phone within reach   Nurse Communication          Time: 0811-0820 OT Time Calculation (min): 9 min  Charges: OT General Charges $OT Visit: 1 Visit OT Treatments $Self Care/Home Management : 8-22 mins  Janice Coffin, COTA/L 07/01/2017, 9:23 AM

## 2017-07-02 DIAGNOSIS — L03113 Cellulitis of right upper limb: Principal | ICD-10-CM

## 2017-07-02 LAB — BASIC METABOLIC PANEL
Anion gap: 10 (ref 5–15)
BUN: 73 mg/dL — AB (ref 6–20)
CO2: 19 mmol/L — ABNORMAL LOW (ref 22–32)
CREATININE: 5.36 mg/dL — AB (ref 0.61–1.24)
Calcium: 8.5 mg/dL — ABNORMAL LOW (ref 8.9–10.3)
Chloride: 106 mmol/L (ref 101–111)
GFR calc Af Amer: 13 mL/min — ABNORMAL LOW (ref >60)
GFR, EST NON AFRICAN AMERICAN: 11 mL/min — AB (ref >60)
GLUCOSE: 139 mg/dL — AB (ref 65–99)
Potassium: 3.6 mmol/L (ref 3.5–5.1)
SODIUM: 135 mmol/L (ref 135–145)

## 2017-07-02 LAB — GLUCOSE, CAPILLARY
GLUCOSE-CAPILLARY: 145 mg/dL — AB (ref 65–99)
Glucose-Capillary: 153 mg/dL — ABNORMAL HIGH (ref 65–99)
Glucose-Capillary: 181 mg/dL — ABNORMAL HIGH (ref 65–99)
Glucose-Capillary: 196 mg/dL — ABNORMAL HIGH (ref 65–99)

## 2017-07-02 LAB — CBC
HEMATOCRIT: 21.4 % — AB (ref 39.0–52.0)
Hemoglobin: 7.1 g/dL — ABNORMAL LOW (ref 13.0–17.0)
MCH: 28 pg (ref 26.0–34.0)
MCHC: 33.2 g/dL (ref 30.0–36.0)
MCV: 84.3 fL (ref 78.0–100.0)
PLATELETS: 159 10*3/uL (ref 150–400)
RBC: 2.54 MIL/uL — ABNORMAL LOW (ref 4.22–5.81)
RDW: 15.1 % (ref 11.5–15.5)
WBC: 9 10*3/uL (ref 4.0–10.5)

## 2017-07-02 MED ORDER — BACLOFEN 10 MG PO TABS
5.0000 mg | ORAL_TABLET | Freq: Three times a day (TID) | ORAL | Status: DC
  Administered 2017-07-02 – 2017-07-03 (×3): 5 mg via ORAL
  Filled 2017-07-02 (×3): qty 1

## 2017-07-02 MED ORDER — BACLOFEN 10 MG PO TABS: 5.0000 mg | ORAL_TABLET | Freq: Two times a day (BID) | ORAL | Status: DC

## 2017-07-02 MED ORDER — DOXYCYCLINE HYCLATE 100 MG PO TABS
100.0000 mg | ORAL_TABLET | Freq: Two times a day (BID) | ORAL | Status: DC
  Administered 2017-07-02 – 2017-07-03 (×2): 100 mg via ORAL
  Filled 2017-07-02 (×2): qty 1

## 2017-07-02 MED ORDER — METHOCARBAMOL 500 MG PO TABS
500.0000 mg | ORAL_TABLET | Freq: Three times a day (TID) | ORAL | Status: DC
  Administered 2017-07-02 – 2017-07-03 (×3): 500 mg via ORAL
  Filled 2017-07-02 (×3): qty 1

## 2017-07-02 MED ORDER — INSULIN ASPART PROT & ASPART (70-30 MIX) 100 UNIT/ML ~~LOC~~ SUSP
22.0000 [IU] | Freq: Two times a day (BID) | SUBCUTANEOUS | Status: DC
  Administered 2017-07-02 – 2017-07-03 (×2): 22 [IU] via SUBCUTANEOUS
  Filled 2017-07-02 (×2): qty 10

## 2017-07-02 MED ORDER — DARBEPOETIN ALFA 150 MCG/0.3ML IJ SOSY
150.0000 ug | PREFILLED_SYRINGE | INTRAMUSCULAR | Status: DC
Start: 2017-07-02 — End: 2017-07-03
  Administered 2017-07-03: 150 ug via SUBCUTANEOUS
  Filled 2017-07-02: qty 0.3

## 2017-07-02 NOTE — Progress Notes (Signed)
Subjective: Interval History: has no complaint .  Objective: Vital signs in last 24 hours: Temp:  [98 F (36.7 C)-98.1 F (36.7 C)] 98.1 F (36.7 C) (04/28 2001) Pulse Rate:  [70-76] 70 (04/29 0523) Resp:  [16] 16 (04/29 0523) BP: (124-128)/(53-63) 124/53 (04/29 0523) SpO2:  [98 %-100 %] 100 % (04/29 0523) Weight change:   Intake/Output from previous day: 04/28 0701 - 04/29 0700 In: 720 [P.O.:720] Out: 650 [Urine:650] Intake/Output this shift: Total I/O In: 240 [P.O.:240] Out: 350 [Urine:350]  General appearance: alert, cooperative, moderately obese and pale Resp: clear to auscultation bilaterally Cardio: S1, S2 normal and systolic murmur: holosystolic 2/6, blowing at apex GI: obese, pos bs Extremities: dressing R arm  Lab Results: Recent Labs    07/01/17 0530 07/02/17 0634  WBC 11.0* 9.0  HGB 7.7* 7.1*  HCT 23.1* 21.4*  PLT 159 159   BMET:  Recent Labs    07/01/17 0530 07/02/17 0634  NA 130* 135  K 3.9 3.6  CL 101 106  CO2 18* 19*  GLUCOSE 185* 139*  BUN 77* 73*  CREATININE 5.56* 5.36*  CALCIUM 8.8* 8.5*   No results for input(s): PTH in the last 72 hours. Iron Studies:  Recent Labs    06/30/17 0901  IRON 66  TIBC 182*  FERRITIN 370*    Studies/Results: No results found.  I have reviewed the patient's current medications.  Assessment/Plan: 1 CKD 4-5, little change in GFR from baseline.  Vol ok.  Mild MA on Na bicarb 2 DM controlled 3 Necrotizing fasciitis 4 Anemia address 5 HPTH check P Na bicarb, check PTH    LOS: 6 days   Jeneen Rinks Gabryella Murfin 07/02/2017,11:51 AM

## 2017-07-02 NOTE — Progress Notes (Signed)
Patient ID: Matthew [name], male   DOB: 29-Sep-1965, 52 y.o.   MRN: [id number] Patient is seen at bedside.  Patient is stable.  Arm is clean dry and intact.  He has good motion.  Kidney functions are being monitored and I have reviewed the chart.  At present time we will continue antibiotic treatment orally and observation of the right arm.  Patient is aware of all plans and follow-up concerns  Matthew Daddario MD

## 2017-07-02 NOTE — Progress Notes (Signed)
Patient Demographics:    Matthew [name], is a 52 y.o. male, DOB - 15-Jan-1966, ASN:[id number]  Admit date - 06/25/2017   Admitting Physician Vianne Bulls, MD  Outpatient Primary MD for the patient is [name], MD  LOS - 6   Chief Complaint  Patient presents with  . Abscess        Subjective:    Matthew [name] today has no fevers, no emesis,  No chest pain, voiding okay, eating okay   Assessment  & Plan :    Principal Problem:   Cellulitis Active Problems:   HYPERTENSION, BENIGN   Chronic systolic heart failure (HCC)   CKD stage 4 due to type 2 diabetes mellitus ([place])   Dyslipidemia associated with type 2 diabetes mellitus ([place])   Uncontrolled type 2 diabetes mellitus with stage 4 chronic kidney disease, with long-term current use of insulin (HCC)   Right forearm cellulitis   AKI (acute kidney injury) ([place])  Brief Narrative:  51 y.o.malewith medical history significant forhypertension, diabetes, chronic kidney disease stage IV, chronic systolic congestive heart failure, history of nonsustained VT status post ICD, coronary artery disease status post CABG. Pt presented with 1 week of erythema of right wrist along with decreased grip and weakness. He went to urgent care and was given aninjection of antibiotics and started on a oral course of doxycycline. Cellulitis progressively worsened.    Assessment & Plan:   1)  Cellulitis, right forearm / Leukocytosis - - S/P nerve release fasciectomy and tenotomy of the FCU tendon. Per ortho, pt fascia was necrotic and he had massive amounts of thick purulent fluid and required an extensive debridement 06/26/17. The wound was left open, - S/P I&D repeat right forearm tenosynovectomy 06/28/17 and 06/30/17, - Stopped vanco 06/28/17, -stop zosyn , OR wound cx from 06/26/17 and 06/27/17 with MRSA--- , so  switched to doxycycline for 4 weeks 100 mg BID  (started 07/02/17).    2)  HYPERTENSION, BENIGN-stable, continue metoprolol XL 50 mg daily   3)HFrEF-  Chronic systolic heart failure , last known EF 45% gated images from 06/2016, overall stable, no acute exacerbation continue metoprolol XL 50 mg daily,  4) Anxiety and Depression-stable, continue Prozac, Buspar   5) Dyslipidemia- - Continue crestor and asa    6)AKI----acute kidney injury on CKD stage IV-     creatinine on admission= 4.54  ,   baseline creatinine = 3   , creatinine is now= 5.36 (peak was 5.56)   ,  Avoid nephrotoxic agents/dehydration/hypotension, ??? If AKI is secondary to Vanco toxicity, discussed with nephrologist Dr. Jimmy Footman, bicarb added, renal ultrasound without obstructive findings, nephrology input appreciated, PTH pending,    7)DM-continue insulin regimen, A1c is 9.3  8)Anemia of CKD- c/n Aranesp, transfuse as clinically indicated   DVT prophylaxis: SCD's Code Status: full code  Family Communication: no family at the bedside Disposition Plan:  Home in 1 to 2 days if renal function stabilizes, will need doxycycline for 4 to 6 weeks Consultants:   Ortho  Nephrology   Procedures:  I&D 4/23, 4/25, 4/28  Antimicrobials:   Vanco 4/22 --> 4/26   Zosyn 4/22 --> Started Doxycycline 100 mg bid on 07/02/17 for 4 to 6 wks  DVT Prophylaxis  :  SCDs   Lab Results  Component Value Date   PLT 159 07/02/2017    Inpatient Medications  Scheduled Meds: . aspirin EC  325 mg Oral Daily  . baclofen  5 mg Oral TID  . busPIRone  10 mg Oral BID  . Chlorhexidine Gluconate Cloth  6 each Topical Q0600  . darbepoetin (ARANESP) injection - NON-DIALYSIS  150 mcg Subcutaneous Q Mon-1800  . doxycycline  100 mg Oral Q12H  . FLUoxetine  20 mg Oral Daily  . fluticasone  2 spray Each Nare Daily  . insulin aspart  0-15 Units Subcutaneous TID WC  . insulin aspart  0-5 Units Subcutaneous QHS  . insulin aspart protamine- aspart  20 Units Subcutaneous BID WC  .  metoprolol succinate  50 mg Oral Daily  . oxymetazoline  1 spray Each Nare BID  . rosuvastatin  40 mg Oral Daily  . senna-docusate  1 tablet Oral BID  . sodium bicarbonate  1,300 mg Oral BID  . vitamin C  1,000 mg Oral Daily   Continuous Infusions: . sodium chloride 10 mL/hr at 06/26/17 1829  . sodium chloride 10 mL/hr (06/29/17 1600)  . methocarbamol (ROBAXIN)  IV     PRN Meds:.acetaminophen **OR** acetaminophen, methocarbamol (ROBAXIN)  IV **OR** methocarbamol, morphine injection, ondansetron **OR** ondansetron (ZOFRAN) IV, oxyCODONE, polyvinyl alcohol    Anti-infectives (From admission, onward)   Start     Dose/Rate Route Frequency Ordered Stop   07/02/17 2200  doxycycline (VIBRA-TABS) tablet 100 mg     100 mg Oral Every 12 hours 07/02/17 0914     06/30/17 1100  vancomycin (VANCOCIN) 1,500 mg in sodium chloride 0.9 % 500 mL IVPB  Status:  Discontinued     1,500 mg 250 mL/hr over 120 Minutes Intravenous Every 48 hours 06/30/17 1011 07/02/17 0914   06/29/17 2200  piperacillin-tazobactam (ZOSYN) IVPB 3.375 g  Status:  Discontinued     3.375 g 12.5 mL/hr over 240 Minutes Intravenous Every 12 hours 06/29/17 1118 07/02/17 0914   06/29/17 0200  piperacillin-tazobactam (ZOSYN) IVPB 3.375 g  Status:  Discontinued     3.375 g 12.5 mL/hr over 240 Minutes Intravenous Every 8 hours 06/28/17 1911 06/29/17 1118   06/28/17 0500  vancomycin (VANCOCIN) 1,500 mg in sodium chloride 0.9 % 500 mL IVPB  Status:  Discontinued     1,500 mg 250 mL/hr over 120 Minutes Intravenous Every 48 hours 06/26/17 1508 06/29/17 1118   06/26/17 1800  piperacillin-tazobactam (ZOSYN) IVPB 2.25 g  Status:  Discontinued     2.25 g 100 mL/hr over 30 Minutes Intravenous Every 6 hours 06/26/17 1508 06/28/17 1910   06/26/17 1000  piperacillin-tazobactam (ZOSYN) IVPB 3.375 g  Status:  Discontinued     3.375 g 12.5 mL/hr over 240 Minutes Intravenous Every 8 hours 06/26/17 0346 06/26/17 1508   06/26/17 1000   piperacillin-tazobactam (ZOSYN) IVPB 3.375 g  Status:  Discontinued     3.375 g 100 mL/hr over 30 Minutes Intravenous  Once 06/26/17 0953 06/26/17 1111   06/26/17 1000  vancomycin (VANCOCIN) IVPB 1000 mg/200 mL premix  Status:  Discontinued     1,000 mg 200 mL/hr over 60 Minutes Intravenous  Once 06/26/17 0953 06/26/17 1111   06/26/17 0400  vancomycin (VANCOCIN) 2,000 mg in sodium chloride 0.9 % 500 mL IVPB     2,000 mg 250 mL/hr over 120 Minutes Intravenous  Once 06/26/17 0346 06/26/17 0700   06/26/17 0400  piperacillin-tazobactam (ZOSYN) IVPB 3.375 g  3.375 g 100 mL/hr over 30 Minutes Intravenous  Once 06/26/17 0346 06/26/17 0459   06/25/17 2115  vancomycin (VANCOCIN) 2,000 mg in sodium chloride 0.9 % 500 mL IVPB  Status:  Discontinued     2,000 mg 250 mL/hr over 120 Minutes Intravenous  Once 06/25/17 2100 06/26/17 0346   06/25/17 2100  vancomycin (VANCOCIN) IVPB 1000 mg/200 mL premix  Status:  Discontinued     1,000 mg 200 mL/hr over 60 Minutes Intravenous  Once 06/25/17 2053 06/25/17 2057        Objective:   Vitals:   07/01/17 0517 07/01/17 1300 07/01/17 2001 07/02/17 0523  BP: 135/72 126/63 128/61 (!) 124/53  Pulse: 79 72 76 70  Resp:  16 16 16   Temp: 97.8 F (36.6 C) 98 F (36.7 C) 98.1 F (36.7 C)   TempSrc: Oral Oral Oral Oral  SpO2: 97% 98% 99% 100%  Weight:      Height:        Wt Readings from Last 3 Encounters:  06/25/17 115.7 kg (255 lb)  06/23/17 115.4 kg (254 lb 8 oz)  04/23/17 117.9 kg (260 lb)     Intake/Output Summary (Last 24 hours) at 07/02/2017 1501 Last data filed at 07/02/2017 0858 Gross per 24 hour  Intake 720 ml  Output 1000 ml  Net -280 ml     Physical Exam  Gen:- Awake Alert, in no apparent distress  HEENT:- [place].AT, No sclera icterus Neck-Supple Neck,No JVD,.  Lungs-  CTAB , good air movement CV- S1, S2 normal,  Abd-  +ve B.Sounds, Abd Soft, No tenderness,    Extremity/Skin:- Rt forearm dressing  Psych-affect is appropriate,  oriented x3 Neuro-no new focal deficits, no tremors   Data Review:   Micro Results Recent Results (from the past 240 hour(s))  Culture, blood (routine x 2)     Status: None   Collection Time: 06/25/17  9:04 PM  Result Value Ref Range Status   Specimen Description BLOOD RIGHT ANTECUBITAL  Final   Special Requests   Final    BOTTLES DRAWN AEROBIC AND ANAEROBIC Blood Culture adequate volume   Culture   Final    NO GROWTH 5 DAYS Performed at [facility], [place] [address], [place], [place] [address]    Report Status 06/30/2017 FINAL  Final  Aerobic/Anaerobic Culture (surgical/deep wound)     Status: None   Collection Time: 06/26/17  8:35 PM  Result Value Ref Range Status   Specimen Description ABSCESS RIGHT FOREARM  Final   Special Requests PATIENT ON FOLLOWING ZOSYN AND VANC  Final   Gram Stain   Final    ABUNDANT WBC PRESENT, PREDOMINANTLY PMN FEW GRAM POSITIVE COCCI    Culture   Final    FEW METHICILLIN RESISTANT STAPHYLOCOCCUS AUREUS NO ANAEROBES ISOLATED Performed at [facility], [place] [address] [place], [place] [address]    Report Status 07/01/2017 FINAL  Final   Organism ID, Bacteria METHICILLIN RESISTANT STAPHYLOCOCCUS AUREUS  Final      Susceptibility   Methicillin resistant staphylococcus aureus - MIC*    CIPROFLOXACIN >=8 RESISTANT Resistant     ERYTHROMYCIN >=8 RESISTANT Resistant     GENTAMICIN <=0.5 SENSITIVE Sensitive     OXACILLIN >=4 RESISTANT Resistant     TETRACYCLINE <=1 SENSITIVE Sensitive     VANCOMYCIN 1 SENSITIVE Sensitive     TRIMETH/SULFA <=10 SENSITIVE Sensitive     CLINDAMYCIN <=0.25 SENSITIVE Sensitive     RIFAMPIN <=0.5 SENSITIVE Sensitive  Inducible Clindamycin NEGATIVE Sensitive     * FEW METHICILLIN RESISTANT STAPHYLOCOCCUS AUREUS  Surgical pcr screen     Status: Abnormal   Collection Time: 06/27/17 12:59 PM  Result Value Ref Range Status   MRSA, PCR POSITIVE (A) NEGATIVE Final    Comment: RESULT CALLED TO, READ BACK BY AND  VERIFIED WITH: Burna Mortimer RN 15:40 06/27/17 ([email or web address])    Staphylococcus aureus POSITIVE (A) NEGATIVE Final    Comment: (NOTE) The Xpert SA Assay (FDA approved for NASAL specimens in patients 80 years of age and older), is one component of a comprehensive surveillance program. It is not intended to diagnose infection nor to guide or monitor treatment. Performed at [facility], [place] [address] [place], [place] [address]     Radiology Reports Ct Forearm Right Wo Contrast  Result Date: 06/26/2017 CLINICAL DATA:  Cellulitis of the volar mid forearm. EXAM: CT OF THE RIGHT FOREARM WITHOUT CONTRAST TECHNIQUE: Multidetector CT imaging was performed according to the standard protocol. Multiplanar CT image reconstructions were also generated. COMPARISON:  None. FINDINGS: Bones/Joint/Cartilage No acute fracture or dislocation. No osseous destruction. Joint spaces are preserved. Bone mineralization is normal. Ligaments Suboptimally assessed by CT. Muscles and Tendons There are foci of soft tissue gas with prominent fluid along the distal flexor carpi ulnaris myotendinous junction. There is enlargement and hypodensity of the distal flexor carpi ulnaris muscle. Remaining flexor and extensor muscles and tendons are grossly unremarkable. Soft tissues Prominent skin thickening and subcutaneous edema along the volar forearm and wrist. Vascular calcifications. IMPRESSION: 1. Foci of soft tissue gas and prominent fluid along the distal flexor carpi ulnaris myotendinous junction with enlargement and hypodensity of the distal muscle, concerning for necrotizing fasciitis/myositis. 2. Prominent skin thickening and subcutaneous edema along the volar forearm and wrist, consistent with cellulitis given clinical history. Electronically Signed   By: Titus Dubin M.D.   On: 06/26/2017 14:39   US Renal  Result Date: 06/29/2017 CLINICAL DATA:  Renal failure EXAM: RENAL / URINARY TRACT ULTRASOUND COMPLETE COMPARISON:   Abdomen ultrasound, 07/26/2016 FINDINGS: Right Kidney: Length: 11.7 cm. There is borderline increased parenchymal echogenicity. Mild cortical thinning. Lobulated renal contour. No masses or stones. No hydronephrosis. Left Kidney: Length: 12.8 cm. There is borderline increased parenchymal echogenicity. Mild cortical thinning. Lobulated renal contour. No masses or stones. No hydronephrosis. Bladder: Appears normal for degree of bladder distention. IMPRESSION: 1. No acute findings.  No hydronephrosis. 2. Borderline increased renal parenchymal echogenicity. Mild renal cortical thinning. Findings suggest medical renal disease. Electronically Signed   By: Lajean Manes M.D.   On: 06/29/2017 16:38     CBC Recent Labs  Lab 06/25/17 2104  06/28/17 0611 06/29/17 0657 06/30/17 0901 07/01/17 0530 07/02/17 0634  WBC 16.5*   < > 13.0* 11.6* 11.5* 11.0* 9.0  HGB 11.6*   < > 10.3* 8.6* 7.7* 7.7* 7.1*  HCT 34.2*   < > 31.0* 26.8* 23.4* 23.1* 21.4*  PLT 134*   < > 157 158 163 159 159  MCV 82.6   < > 84.0 85.6 85.1 83.7 84.3  MCH 28.0   < > 27.9 27.5 28.0 27.9 28.0  MCHC 33.9   < > 33.2 32.1 32.9 33.3 33.2  RDW 14.8   < > 15.2 15.5 15.5 14.9 15.1  LYMPHSABS 1.0  --   --   --   --   --   --   MONOABS 1.3*  --   --   --   --   --   --  EOSABS 0.1  --   --   --   --   --   --   BASOSABS 0.0  --   --   --   --   --   --    < > = values in this interval not displayed.    Chemistries  Recent Labs  Lab 06/28/17 0611 06/29/17 0657 06/30/17 0901 07/01/17 0530 07/02/17 0634  NA 134* 133* 128* 130* 135  K 3.9 4.0 3.6 3.9 3.6  CL 105 106 97* 101 106  CO2 16* 15* 18* 18* 19*  GLUCOSE 172* 212* 138* 185* 139*  BUN 70* 74* 77* 77* 73*  CREATININE 4.59* 5.07* 5.49* 5.56* 5.36*  CALCIUM 8.9 8.5* 8.6* 8.8* 8.5*  AST  --   --   --  29  --   ALT  --   --   --  47  --   ALKPHOS  --   --   --  102  --   BILITOT  --   --   --  1.0  --     ------------------------------------------------------------------------------------------------------------------ No results for input(s): CHOL, HDL, LDLCALC, TRIG, CHOLHDL, LDLDIRECT in the last 72 hours.  Lab Results  Component Value Date   HGBA1C 9.3 (H) 07/01/2017   ------------------------------------------------------------------------------------------------------------------ No results for input(s): TSH, T4TOTAL, T3FREE, THYROIDAB in the last 72 hours.  Invalid input(s): FREET3 ------------------------------------------------------------------------------------------------------------------ Recent Labs    06/30/17 0901  FERRITIN 370*  TIBC 182*  IRON 66    Coagulation profile No results for input(s): INR, PROTIME in the last 168 hours.  No results for input(s): DDIMER in the last 72 hours.  Cardiac Enzymes No results for input(s): CKMB, TROPONINI, MYOGLOBIN in the last 168 hours.  Invalid input(s): CK ------------------------------------------------------------------------------------------------------------------ No results found for: BNP   Roxan Hockey M.D on 07/02/2017 at 3:01 PM  Between 7am to 7pm - Pager - [phone]  After 7pm go to [email or web address] - password [email or web address]  [facility] -  Office  [phone]   Voice Recognition Viviann Spare dictation system was used to create this note, attempts have been made to correct errors. Please contact the author with questions and/or clarifications.

## 2017-07-03 LAB — RENAL FUNCTION PANEL
ANION GAP: 12 (ref 5–15)
Albumin: 2.2 g/dL — ABNORMAL LOW (ref 3.5–5.0)
BUN: 68 mg/dL — ABNORMAL HIGH (ref 6–20)
CHLORIDE: 106 mmol/L (ref 101–111)
CO2: 21 mmol/L — AB (ref 22–32)
Calcium: 8.9 mg/dL (ref 8.9–10.3)
Creatinine, Ser: 5.13 mg/dL — ABNORMAL HIGH (ref 0.61–1.24)
GFR, EST AFRICAN AMERICAN: 13 mL/min — AB (ref >60)
GFR, EST NON AFRICAN AMERICAN: 12 mL/min — AB (ref >60)
Glucose, Bld: 119 mg/dL — ABNORMAL HIGH (ref 65–99)
POTASSIUM: 3.7 mmol/L (ref 3.5–5.1)
Phosphorus: 6.7 mg/dL — ABNORMAL HIGH (ref 2.5–4.6)
Sodium: 139 mmol/L (ref 135–145)

## 2017-07-03 LAB — GLUCOSE, CAPILLARY
GLUCOSE-CAPILLARY: 124 mg/dL — AB (ref 65–99)
Glucose-Capillary: 136 mg/dL — ABNORMAL HIGH (ref 65–99)

## 2017-07-03 MED ORDER — SODIUM BICARBONATE 650 MG PO TABS: 1300.0000 mg | ORAL_TABLET | Freq: Two times a day (BID) | ORAL | 2 refills | Status: DC

## 2017-07-03 MED ORDER — ROSUVASTATIN CALCIUM 40 MG PO TABS: 40.0000 mg | ORAL_TABLET | Freq: Every day | ORAL | 1 refills | Status: DC

## 2017-07-03 MED ORDER — BACLOFEN 10 MG PO TABS: 5.0000 mg | ORAL_TABLET | Freq: Two times a day (BID) | ORAL | 0 refills | Status: DC

## 2017-07-03 MED ORDER — SENNOSIDES-DOCUSATE SODIUM 8.6-50 MG PO TABS: 2.0000 | ORAL_TABLET | Freq: Two times a day (BID) | ORAL | 1 refills | Status: DC

## 2017-07-03 MED ORDER — DOXYCYCLINE HYCLATE 100 MG PO TABS: 100.0000 mg | ORAL_TABLET | Freq: Two times a day (BID) | ORAL | 0 refills | Status: DC

## 2017-07-03 MED ORDER — ASCORBIC ACID 1000 MG PO TABS: 1000.0000 mg | ORAL_TABLET | Freq: Every day | ORAL | 1 refills | Status: AC

## 2017-07-03 MED ORDER — INSULIN NPH ISOPHANE & REGULAR (70-30) 100 UNIT/ML ~~LOC~~ SUSP: 22.0000 [IU] | Freq: Two times a day (BID) | SUBCUTANEOUS | 11 refills | Status: DC

## 2017-07-03 MED ORDER — LACTINEX PO CHEW: 1.0000 | CHEWABLE_TABLET | Freq: Three times a day (TID) | ORAL | 0 refills | Status: DC

## 2017-07-03 MED ORDER — FUROSEMIDE 40 MG PO TABS: 80.0000 mg | ORAL_TABLET | Freq: Every day | ORAL | Status: DC

## 2017-07-03 MED ORDER — METOPROLOL SUCCINATE ER 50 MG PO TB24: 50.0000 mg | ORAL_TABLET | Freq: Every day | ORAL | 1 refills | Status: DC

## 2017-07-03 MED ORDER — OXYCODONE HCL 5 MG PO TABS: 5.0000 mg | ORAL_TABLET | ORAL | 0 refills | Status: DC | PRN

## 2017-07-03 MED ORDER — FUROSEMIDE 80 MG PO TABS: 80.0000 mg | ORAL_TABLET | Freq: Every day | ORAL | 0 refills | Status: DC

## 2017-07-03 MED ORDER — ONDANSETRON HCL 4 MG PO TABS: 4.0000 mg | ORAL_TABLET | Freq: Four times a day (QID) | ORAL | 0 refills | Status: DC | PRN

## 2017-07-03 NOTE — Discharge Instructions (Signed)
1) go to orthopedic doctor on Thursday, 07/05/2017 at 9:30 AM for right hand wound check 2) take doxycycline antibiotic twice a day for at least a month as prescribed-avoid excessive sun exposure while taking antibiotics 3) go to your primary care doctor on Friday, 07/06/2017 for repeat CBC and BMP blood test 4) follow-up with your nephrologist next week after the results of your blood test are available

## 2017-07-03 NOTE — Discharge Summary (Signed)
Matthew [name], is a 52 y.o. male  DOB Oct 22, 1965  MRN [id number].  Admission date:  06/25/2017  Admitting Physician  Vianne Bulls, MD  Discharge Date:  07/03/2017   Primary MD  [name], MD  Recommendations for primary care physician for things to follow:   1) go to orthopedic doctor on Thursday, 07/05/2017 at 9:30 AM for right hand wound check 2) take doxycycline antibiotic twice a day for at least a month as prescribed-avoid excessive sun exposure while taking antibiotics 3) go to your primary care doctor on Friday, 07/06/2017 for repeat CBC and BMP blood test 4) follow-up with your nephrologist next week after the results of your blood test are available   Admission Diagnosis  Cellulitis of right upper extremity [L03.113] AKI (acute kidney injury) ([place]) [N17.9]   Discharge Diagnosis  Cellulitis of right upper extremity [L03.113] AKI (acute kidney injury) ([place]) [N17.9]   Principal Problem:   Cellulitis Active Problems:   HYPERTENSION, BENIGN   Chronic systolic heart failure ([place])   CKD stage 4 due to type 2 diabetes mellitus ([place])   Dyslipidemia associated with type 2 diabetes mellitus ([place])   Uncontrolled type 2 diabetes mellitus with stage 4 chronic kidney disease, with long-term current use of insulin ([facility])   Right forearm cellulitis   AKI (acute kidney injury) ([place])      Past Medical History:  Diagnosis Date  . Abnormality of gait 04/08/2013  . AICD (automatic cardioverter/defibrillator) present   . Anxiety    stress  . Anxiety and depression   . CAD (coronary artery disease)    a. OOH MI 6/11; presented with acute CHF; hosp course c/b VF arrest with VDRF, etc;   b s/p CABG: L-LAD/Dx, S-OM/dCFX;  c.Nuclear scan 8/12:  Inferior and inf-lat scar with minimal peri-infarct ischemia, EF 53%.; d.LHC 9/12:  LAD 90%, pD1 (small) 70%, prox large Dx 95%, L-LAD and Dx ok, CFX occluded,  S-OM and CFX ok, RCA prox to prox/mid 60-70%.  Medical management     . Cardiac arrest - ventricular fibrillation 08/26/2009  . Carpal tunnel syndrome of right wrist   . Cellulitis 2012   spider bite  . Cellulitis 06/2017   right upper extremity  . Chronic systolic heart failure ([place])   . CKD (chronic kidney disease)   . Coma ([place])   . Diabetic coma with ketoacidosis ([place])    Occured in 1994 where he spent 30 days in a coma . He develpoed infection of his leg and developing pancreatitis during that  time   . DM2 (diabetes mellitus, type 2) ([place])   . Dysrhythmia   . Foot drop, bilateral 04/08/2013  . Headache(784.0)    sinus  . History of degenerative disc disease   . HLD (hyperlipidemia)   . HTN (hypertension)   . Hypercholesteremia   . Ischemic cardiomyopathy    echo 10/11:  inf-septal and apical HK, EF 35%, mod LAE  . Neuropathy   . Nocturnal leg cramps   . Nocturnal leg cramps  04/02/2017  . Obesity   . Pancreatitis 2008  . Periodic limb movement 04/02/2017  . 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 Presence of permanent cardiac pacemaker   . Retinal detachment    Bilateral, laser surgery on the right  . RLS (restless legs syndrome) 04/22/2015  . Thyroid disease   . 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 APPENDECTOMY  mid 1990  . CARDIAC DEFIBRILLATOR PLACEMENT    . CARDIAC SURGERY    . CARPAL TUNNEL WITH CUBITAL TUNNEL  02/22/2012   Procedure: CARPAL TUNNEL WITH CUBITAL TUNNEL;  Surgeon: Roseanne Kaufman, MD;  Location: [facility];  Service: Orthopedics;  Laterality: Right;  Right Carpal Tunnel Release/Right Cubital Tunnel Release and Transposition if Necessary with Flexor Pronator Release  . CATARACT EXTRACTION    . COLONOSCOPY WITH PROPOFOL N/A 11/17/2014   Procedure: COLONOSCOPY WITH PROPOFOL;  Surgeon: Manus Gunning, MD;  Location: [facility];  Service: Gastroenterology;  Laterality: N/A;  . COLONOSCOPY  WITH PROPOFOL N/A 01/05/2015   Procedure: COLONOSCOPY WITH PROPOFOL;  Surgeon: Manus Gunning, MD;  Location: [facility];  Service: Gastroenterology;  Laterality: N/A;  . CORONARY ARTERY BYPASS GRAFT    . coronary artery bypass grafting x4  june 29,2011   x 4  . I&D EXTREMITY Right 06/26/2017   Procedure: IRRIGATION AND DEBRIDEMENT RIGHT FOREARM;  Surgeon: Roseanne Kaufman, MD;  Location: [facility];  Service: Orthopedics;  Laterality: Right;  . I&D EXTREMITY Right 06/28/2017   Procedure: Repeat irrigation and debridement right forearm;  Surgeon: Roseanne Kaufman, MD;  Location: [facility];  Service: Orthopedics;  Laterality: Right;  . I&D EXTREMITY Right 06/30/2017   Procedure: Repeat irrigation and debridement right forearm and reconstruction as necessary;  Surgeon: Roseanne Kaufman, MD;  Location: [facility];  Service: Orthopedics;  Laterality: Right;  60 mins  . PACEMAKER PLACEMENT    . RETINAL DETACHMENT SURGERY Bilateral   . RETINAL DETACHMENT SURGERY Bilateral    3 months ago  . right sided abdominal cyst removal    . TONSILLECTOMY  1998  . ULNAR NERVE TRANSPOSITION Right        HPI  from the history and physical done on the day of admission:    Chief Complaint: Right arm pain and swelling  HPI: Matthew [name] is a 52 y.o. male with medical history significant of hypertension, diabetes, chronic kidney disease stage IV, chronic systolic congestive heart failure, history of nonsustained VT status post ICD, coronary artery disease status post CABG.  Patient reports that approximately 1 week ago, he woke up from sleeping and noticed an area of erythema over his right wrist.  He had associated pain and decreased grip in his right hand.  He had gone to see urgent care after the next few days and was given an injection of antibiotics and started on a oral course of doxycycline.  Despite taking antibiotics, erythema progressed up his forearm.  He continues to have fevers at night.  Denies any nausea or  vomiting.  No diarrhea.  He has noted increased urinary frequency but denies any dysuria.  ED Course: Patient was evaluated in the emergency room while he was noted to have an elevated WBC count.  Creatinine was elevated above baseline.  Vitals were noted to be stable.  Due to progression of his erythema/cellulitis.  He is been referred for admission.    Hospital Course:    Brief Narrative: 53 y.o.malewith medical history  significant forhypertension, diabetes, chronic kidney disease stage IV, chronic systolic congestive heart failure, history of nonsustained VT status post ICD, coronary artery disease status post CABG. Pt presented with 1 week of erythema of right wrist along with decreased grip and weakness. He went to urgent care and was given aninjection of antibiotics and started on a oral course of doxycycline. Cellulitis progressively worsened.    Assessment & Plan:  1)Cellulitis, right forearm / Leukocytosis - - S/P nerve release fasciectomy and tenotomy of the FCU tendon. Per ortho, pt fascia was necrotic and he had massive amounts of thick purulent fluid and required an extensive debridement 06/26/17. The wound was left open, - S/P I&D repeat right forearm tenosynovectomy 4/25/19and 06/30/17, - Stopped vanco 06/28/17, -stop zosyn , OR wound cx from 06/26/17 and 06/27/17 with MRSA--- , so  switched to doxycycline for 4 weeks 100 mg BID (started 07/02/17).  Follow-up with PCP this week for repeat CBC and BMP    2)HYPERTENSION, BENIGN-stable, continue metoprolol XL 50 mg daily  3)HFrEF- Chronic systolic heart failure , last known EF 45% gated images from 06/2016, overall stable, no acute exacerbation continue metoprolol XL 50 mg daily,  4)Anxiety and Depression-stable, continue Prozac, Buspar  5)Dyslipidemia- - Continue crestorand asa  6)AKI----acute kidney injury on CKD stage IV-     creatinine on admission= 4.54  ,   baseline creatinine = 3   , creatinine is now=  5.1 (peak was 5.56)   ,  If AKI is secondary to Vanco toxicity, discussed with nephrologist Dr. Jimmy Footman, bicarb added, renal ultrasound without obstructive findings, nephrology input appreciated,  Follow up with nephrologist Dr. Moshe Cipro as outpatient   7)DM-continue insulin regimen, A1c is 9.3  8)Anemia of CKD- c/n Aranesp  As per nephrologist  DVT prophylaxis:SCD's Code Status:full code   Consultants:  Ortho  Nephrology  Procedures:  I&D 4/23, 4/25, 4/28  Antimicrobials:   Vanco 4/22 -->4/26   Zosyn 4/22 --> Started Doxycycline 100 mg bid on 07/02/17 for  One-month    Discharge Condition: stable  Follow UP  Follow-up Information    Roseanne Kaufman, MD Follow up today.   Specialty:  Orthopedic Surgery Why:  Please come to the office Thursday morning at 10 AM to see Dr. Amedeo Plenty for your dressing change. Contact information: [address] [place]  [address] [phone]           Diet and Activity recommendation:  As advised  Discharge Instructions     Discharge Instructions    Call MD for:  difficulty breathing, headache or visual disturbances   Complete by:  As directed    Call MD for:  persistant dizziness or light-headedness   Complete by:  As directed    Call MD for:  persistant nausea and vomiting   Complete by:  As directed    Call MD for:  redness, tenderness, or signs of infection (pain, swelling, redness, odor or green/yellow discharge around incision site)   Complete by:  As directed    Call MD for:  severe uncontrolled pain   Complete by:  As directed    Call MD for:  temperature >100.4   Complete by:  As directed    Diet - low sodium heart healthy   Complete by:  As directed    Discharge instructions   Complete by:  As directed    1) go to orthopedic doctor on Thursday, 07/05/2017 at 9:30 AM for right hand wound check 2) take doxycycline antibiotic twice a  day for at least a month as prescribed-avoid excessive  sun exposure while taking antibiotics 3) go to your primary care doctor on Friday, 07/06/2017 for repeat CBC and BMP blood test 4) follow-up with your nephrologist next week after the results of your blood test are available   Increase activity slowly   Complete by:  As directed         Discharge Medications     Allergies as of 07/03/2017      Reactions   Ace Inhibitors Cough   Cephalexin Other (See Comments)   Resistant to antibiotic   Beta Adrenergic Blockers Cough      Medication List    TAKE these medications   acetaminophen 500 MG tablet Commonly known as:  TYLENOL Take 1,000 mg by mouth as needed.   ascorbic acid 1000 MG tablet Commonly known as:  VITAMIN C Take 1 tablet (1,000 mg total) by mouth daily. Start taking on:  07/04/2017   aspirin 325 MG tablet Take 325 mg by mouth daily.   baclofen 10 MG tablet Commonly known as:  LIORESAL Take 0.5 tablets (5 mg total) by mouth 2 (two) times daily. What changed:    medication strength  how much to take  when to take this   [facility] Generic drug:  Propylhexedrine Place 1 each into the nose 2 (two) times daily as needed (congestion).   busPIRone 10 MG tablet Commonly known as:  BUSPAR Take 1 tablet (10 mg total) by mouth 2 (two) times daily.   doxycycline 100 MG tablet Commonly known as:  VIBRA-TABS Take 1 tablet (100 mg total) by mouth 2 (two) times daily.   FISH OIL BURP-LESS PO Take 1 tablet by mouth daily.   FLUoxetine 20 MG capsule Commonly known as:  PROZAC Take 1 capsule (20 mg total) by mouth daily.   fluticasone 50 MCG/ACT nasal spray Commonly known as:  FLONASE Place 2 sprays into both nostrils daily.   furosemide 80 MG tablet Commonly known as:  LASIX Take 1 tablet (80 mg total) by mouth daily. What changed:    medication strength  how much to take   Insulin Glargine-Lixisenatide 100-33 UNT-MCG/ML Sopn Inject 40 Units into the skin daily. [place]   insulin NPH-regular Human  (70-30) 100 UNIT/ML injection Commonly known as:  NOVOLIN 70/30 Inject 22 Units into the skin 2 (two) times daily with a meal. What changed:  how much to take   lactobacillus acidophilus & bulgar chewable tablet Chew 1 tablet by mouth 3 (three) times daily with meals.   Melatonin 10 MG Tabs Take 10 mg by mouth at bedtime.   methylcellulose 1 % ophthalmic solution Commonly known as:  ARTIFICIAL TEARS Place 1 drop into both eyes 2 (two) times daily as needed (dry eyes).   metoprolol succinate 50 MG 24 hr tablet Commonly known as:  TOPROL-XL Take 1 tablet (50 mg total) by mouth daily. What changed:  additional instructions   multivitamin tablet Take 1 tablet by mouth daily.   nitroGLYCERIN 0.4 MG SL tablet Commonly known as:  NITROSTAT Place 1 tablet (0.4 mg total) under the tongue every 5 (five) minutes as needed for chest pain.   ondansetron 4 MG tablet Commonly known as:  ZOFRAN Take 1 tablet (4 mg total) by mouth every 6 (six) hours as needed for nausea.   oxyCODONE 5 MG immediate release tablet Commonly known as:  Oxy IR/ROXICODONE Take 1 tablet (5 mg total) by mouth every 4 (four) hours as needed for  severe pain (1 to 2 tabs as needed for pain).   pramipexole 0.125 MG tablet Commonly known as:  MIRAPEX Take 1 tablet (0.125 mg total) by mouth at bedtime.   rosuvastatin 40 MG tablet Commonly known as:  CRESTOR Take 1 tablet (40 mg total) by mouth daily.   senna-docusate 8.6-50 MG tablet Commonly known as:  Senokot-S Take 2 tablets by mouth 2 (two) times daily.   sodium bicarbonate 650 MG tablet Take 2 tablets (1,300 mg total) by mouth 2 (two) times daily.       Major procedures and Radiology Reports - PLEASE review detailed and final reports for all details, in brief -    Ct Forearm Right Wo Contrast  Result Date: 06/26/2017 CLINICAL DATA:  Cellulitis of the volar mid forearm. EXAM: CT OF THE RIGHT FOREARM WITHOUT CONTRAST TECHNIQUE: Multidetector CT imaging  was performed according to the standard protocol. Multiplanar CT image reconstructions were also generated. COMPARISON:  None. FINDINGS: Bones/Joint/Cartilage No acute fracture or dislocation. No osseous destruction. Joint spaces are preserved. Bone mineralization is normal. Ligaments Suboptimally assessed by CT. Muscles and Tendons There are foci of soft tissue gas with prominent fluid along the distal flexor carpi ulnaris myotendinous junction. There is enlargement and hypodensity of the distal flexor carpi ulnaris muscle. Remaining flexor and extensor muscles and tendons are grossly unremarkable. Soft tissues Prominent skin thickening and subcutaneous edema along the volar forearm and wrist. Vascular calcifications. IMPRESSION: 1. Foci of soft tissue gas and prominent fluid along the distal flexor carpi ulnaris myotendinous junction with enlargement and hypodensity of the distal muscle, concerning for necrotizing fasciitis/myositis. 2. Prominent skin thickening and subcutaneous edema along the volar forearm and wrist, consistent with cellulitis given clinical history. Electronically Signed   By: Titus Dubin M.D.   On: 06/26/2017 14:39   US Renal  Result Date: 06/29/2017 CLINICAL DATA:  Renal failure EXAM: RENAL / URINARY TRACT ULTRASOUND COMPLETE COMPARISON:  Abdomen ultrasound, 07/26/2016 FINDINGS: Right Kidney: Length: 11.7 cm. There is borderline increased parenchymal echogenicity. Mild cortical thinning. Lobulated renal contour. No masses or stones. No hydronephrosis. Left Kidney: Length: 12.8 cm. There is borderline increased parenchymal echogenicity. Mild cortical thinning. Lobulated renal contour. No masses or stones. No hydronephrosis. Bladder: Appears normal for degree of bladder distention. IMPRESSION: 1. No acute findings.  No hydronephrosis. 2. Borderline increased renal parenchymal echogenicity. Mild renal cortical thinning. Findings suggest medical renal disease. Electronically Signed   By:  Lajean Manes M.D.   On: 06/29/2017 16:38    Micro Results    Recent Results (from the past 240 hour(s))  Culture, blood (routine x 2)     Status: None   Collection Time: 06/25/17  9:04 PM  Result Value Ref Range Status   Specimen Description BLOOD RIGHT ANTECUBITAL  Final   Special Requests   Final    BOTTLES DRAWN AEROBIC AND ANAEROBIC Blood Culture adequate volume   Culture   Final    NO GROWTH 5 DAYS Performed at [facility], [address], [place], [place] [address]    Report Status 06/30/2017 FINAL  Final  Aerobic/Anaerobic Culture (surgical/deep wound)     Status: None   Collection Time: 06/26/17  8:35 PM  Result Value Ref Range Status   Specimen Description ABSCESS RIGHT FOREARM  Final   Special Requests PATIENT ON FOLLOWING ZOSYN AND VANC  Final   Gram Stain   Final    ABUNDANT WBC PRESENT, PREDOMINANTLY PMN FEW GRAM POSITIVE COCCI  Culture   Final    FEW METHICILLIN RESISTANT STAPHYLOCOCCUS AUREUS NO ANAEROBES ISOLATED Performed at [facility], [place] [address] [place], [place] [address]    Report Status 07/01/2017 FINAL  Final   Organism ID, Bacteria METHICILLIN RESISTANT STAPHYLOCOCCUS AUREUS  Final      Susceptibility   Methicillin resistant staphylococcus aureus - MIC*    CIPROFLOXACIN >=8 RESISTANT Resistant     ERYTHROMYCIN >=8 RESISTANT Resistant     GENTAMICIN <=0.5 SENSITIVE Sensitive     OXACILLIN >=4 RESISTANT Resistant     TETRACYCLINE <=1 SENSITIVE Sensitive     VANCOMYCIN 1 SENSITIVE Sensitive     TRIMETH/SULFA <=10 SENSITIVE Sensitive     CLINDAMYCIN <=0.25 SENSITIVE Sensitive     RIFAMPIN <=0.5 SENSITIVE Sensitive     Inducible Clindamycin NEGATIVE Sensitive     * FEW METHICILLIN RESISTANT STAPHYLOCOCCUS AUREUS  Surgical pcr screen     Status: Abnormal   Collection Time: 06/27/17 12:59 PM  Result Value Ref Range Status   MRSA, PCR POSITIVE (A) NEGATIVE Final    Comment: RESULT CALLED TO, READ BACK BY AND VERIFIED WITH: Burna Mortimer RN 15:40 06/27/17 ([email or web address])    Staphylococcus aureus POSITIVE (A) NEGATIVE Final    Comment: (NOTE) The Xpert SA Assay (FDA approved for NASAL specimens in patients 84 years of age and older), is one component of a comprehensive surveillance program. It is not intended to diagnose infection nor to guide or monitor treatment. Performed at [facility], [place] [address], [place], [place] [address]        Today   Subjective    Matthew [name] today has no  New complaints, patient is eager to go home, no fevers          Patient has been seen and examined prior to discharge   Objective   Blood pressure (!) 144/68, pulse 81, temperature 98.1 F (36.7 C), temperature source Oral, resp. rate 18, height 5\' 9"  (1.753 m), weight 115.7 kg (255 lb), SpO2 98 %.   Intake/Output Summary (Last 24 hours) at 07/03/2017 1305 Last data filed at 07/03/2017 0900 Gross per 24 hour  Intake 1410.5 ml  Output 1400 ml  Net 10.5 ml    Exam Gen:- Awake Alert, in no apparent distress  HEENT:- [place].AT, No sclera icterus Neck-Supple Neck,No JVD,.  Lungs-  CTAB , good air movement CV- S1, S2 normal,  Abd-  +ve B.Sounds, Abd Soft, No tenderness,    Extremity/Skin:- Rt forearm dressing  Psych-affect is appropriate, oriented x3 Neuro-no new focal deficits, no tremors     Data Review   CBC w Diff:  Lab Results  Component Value Date   WBC 9.0 07/02/2017   HGB 7.1 (L) 07/02/2017   HCT 21.4 (L) 07/02/2017   PLT 159 07/02/2017   LYMPHOPCT 6 06/25/2017   BANDSPCT 0 05/08/2008   MONOPCT 8 06/25/2017   EOSPCT 0 06/25/2017   BASOPCT 0 06/25/2017    CMP:  Lab Results  Component Value Date   NA 139 07/03/2017   K 3.7 07/03/2017   CL 106 07/03/2017   CO2 21 (L) 07/03/2017   BUN 68 (H) 07/03/2017   CREATININE 5.13 (H) 07/03/2017   CREATININE 2.83 (H) 04/23/2017   PROT 6.2 (L) 07/01/2017   PROT 7.0 04/08/2013   ALBUMIN 2.2 (L) 07/03/2017   BILITOT 1.0 07/01/2017   ALKPHOS 102  07/01/2017   AST 29 07/01/2017   ALT 47 07/01/2017  .   Total  Discharge time is about 33 minutes  Roxan Hockey M.D on 07/03/2017 at 1:05 PM  [facility]   Office  [phone]  Voice Recognition Viviann Spare dictation system was used to create this note, attempts have been made to correct errors. Please contact the author with questions and/or clarifications.

## 2017-07-03 NOTE — Progress Notes (Signed)
Subjective: Interval History: has no complaint, ready to go.  Objective: Vital signs in last 24 hours: Temp:  [97.9 F (36.6 C)-98.1 F (36.7 C)] 98.1 F (36.7 C) (04/30 0538) Pulse Rate:  [77-81] 81 (04/30 0538) Resp:  [18] 18 (04/29 2132) BP: (125-144)/(62-68) 144/68 (04/30 0538) SpO2:  [97 %-98 %] 98 % (04/30 0538) Weight change:   Intake/Output from previous day: 04/29 0701 - 04/30 0700 In: 1940.3 [P.O.:1200; I.V.:740.3] Out: 2050 [Urine:2050] Intake/Output this shift: Total I/O In: 240 [P.O.:240] Out: -   General appearance: alert, cooperative, no distress and moderately obese Resp: diminished breath sounds bilaterally Breasts: not eval GI: obese, pos bs,  Extremities: dressing R arm,  3+ edema  Resp decreased bs  Lab Results: Recent Labs    07/01/17 0530 07/02/17 0634  WBC 11.0* 9.0  HGB 7.7* 7.1*  HCT 23.1* 21.4*  PLT 159 159   BMET:  Recent Labs    07/02/17 0634 07/03/17 0302  NA 135 139  K 3.6 3.7  CL 106 106  CO2 19* 21*  GLUCOSE 139* 119*  BUN 73* 68*  CREATININE 5.36* 5.13*  CALCIUM 8.5* 8.9   No results for input(s): PTH in the last 72 hours. Iron Studies: No results for input(s): IRON, TIBC, TRANSFERRIN, FERRITIN in the last 72 hours.  Studies/Results: No results found.  I have reviewed the patient's current medications.  Assessment/Plan: 1 CKD4/AKI slowly better. Vol xs start Lasix Ok to D/C and close f/u with primary MD and his Nephrologist. 2 Necrotizing fasciitis 3 DM 4 Obesity 5 Anemia needs outpatient f/u P Lasix, ok to d/c    LOS: 7 days   Jeneen Rinks Amala Petion 07/03/2017,11:56 AM

## 2017-07-03 NOTE — Progress Notes (Signed)
Pt d/c home per MD order, pt verbalized understanding of D/c prescriptions sent to pharmacy, family at bs

## 2017-07-03 NOTE — Progress Notes (Signed)
Occupational Therapy Treatment Patient Details Name: Matthew [name] MRN: [id number] DOB: 12-02-1965 Today's Date: 07/03/2017    History of present illness 52 y.o. male with medical history significant of hypertension, diabetes, chronic kidney disease stage IV, chronic systolic congestive heart failure, history of nonsustained VT status post ICD, coronary artery disease status post CABG. Pt presented on 4/22 and found to have Right forearm erythema/cellulitis and worsening infection pattern with CT evidence of soft tissue, air, and abscess formation along the flexor carpi ulnaris. Pt is now s/p nerve release fasciectomy and tenotomy of the FCU tendon. Planning for repeat I&D on 4/25   OT comments  Pt making good progress with functional goals, possible d/c home this afternoon. OT will continue to follow acutely  Follow Up Recommendations  Follow surgeon's recommendation for DC plan and follow-up therapies;Supervision - Intermittent    Equipment Recommendations  3 in 1 bedside commode    Recommendations for Other Services      Precautions / Restrictions Precautions Precautions: Fall Restrictions Weight Bearing Restrictions: No Other Position/Activity Restrictions: no formal order for RUE wt bearing at this time; elevate UE       Mobility Bed Mobility Overal bed mobility: Needs Assistance       Supine to sit: Modified independent (Device/Increase time) Sit to supine: Modified independent (Device/Increase time)      Transfers Overall transfer level: Needs assistance   Transfers: Sit to/from Stand;Stand Pivot Transfers Sit to Stand: Supervision Stand pivot transfers: Supervision       General transfer comment: slow, guarded pace of ambulation/transfers    Balance Overall balance assessment: Needs assistance Sitting-balance support: Feet supported       Standing balance support: Bilateral upper extremity supported;No upper extremity supported Standing balance-Leahy  Scale: Fair                             ADL either performed or assessed with clinical judgement   ADL       Grooming: Set up;Wash/dry hands;Supervision/safety;Standing;Wash/dry face   Upper Body Bathing: Set up;Supervision/ safety   Lower Body Bathing: Set up;Supervison/ safety   Upper Body Dressing : Set up;Supervision/safety   Lower Body Dressing: Supervision/safety;Set up   Toilet Transfer: Ambulation;Regular Toilet;Supervision/safety   Toileting- Clothing Manipulation and Hygiene: Sit to/from stand;Supervision/safety       Functional mobility during ADLs: Supervision/safety       Vision Patient Visual Report: No change from baseline     Perception     Praxis      Cognition Arousal/Alertness: Awake/alert Behavior During Therapy: WFL for tasks assessed/performed Overall Cognitive Status: Within Functional Limits for tasks assessed                                          Exercises Other Exercises Other Exercises: instructed pt in modified tendon gliding exercises x10 reps each motion Other Exercises: Pt participated in ROM of R elbow and shoulder   Shoulder Instructions       General Comments      Pertinent Vitals/ Pain       Pain Assessment: 0-10 Pain Score: 3  Pain Location: R hand with ROM  Pain Descriptors / Indicators: Guarding;Aching;Sore Pain Intervention(s): Monitored during session;Repositioned  Home Living  Prior Functioning/Environment              Frequency           Progress Toward Goals  OT Goals(current goals can now be found in the care plan section)  Progress towards OT goals: Progressing toward goals  Acute Rehab OT Goals Patient Stated Goal: maintain independence   Plan Discharge plan remains appropriate    Co-evaluation                 AM-PAC PT "6 Clicks" Daily Activity     Outcome Measure   Help from another  person eating meals?: A Little Help from another person taking care of personal grooming?: A Little Help from another person toileting, which includes using toliet, bedpan, or urinal?: A Little Help from another person bathing (including washing, rinsing, drying)?: A Little Help from another person to put on and taking off regular upper body clothing?: A Little Help from another person to put on and taking off regular lower body clothing?: A Little 6 Click Score: 18    End of Session Equipment Utilized During Treatment: Gait belt  OT Visit Diagnosis: Muscle weakness (generalized) (M62.81);Pain Pain - Right/Left: Right Pain - part of body: Arm;Hand   Activity Tolerance Patient tolerated treatment well   Patient Left in chair;with call bell/phone within reach   Nurse Communication      Functional Assessment Tool Used: AM-PAC 6 Clicks Daily Activity   Time: 7858-8502 OT Time Calculation (min): 26 min  Charges: OT G-codes **NOT FOR INPATIENT CLASS** Functional Assessment Tool Used: AM-PAC 6 Clicks Daily Activity OT General Charges $OT Visit: 1 Visit OT Treatments $Self Care/Home Management : 8-22 mins $Therapeutic Exercise: 8-22 mins     Britt Bottom 07/03/2017, 1:09 PM

## 2017-07-04 ENCOUNTER — Telehealth: Payer: Self-pay

## 2017-07-04 LAB — PARATHYROID HORMONE, INTACT (NO CA): PTH: 481 pg/mL — AB (ref 15–65)

## 2017-07-04 NOTE — Telephone Encounter (Signed)
Transition Care Management Follow-up Telephone Call   Date discharged? 07/03/17               How have you been since you were released from the hospital? Doing fine   Do you understand why you were in the hospital? Yes, I had an infection in my right arm   Do you understand the discharge instructions? yes   Where were you discharged to? home   Items Reviewed:  Medications reviewed: yes, understands all of meds  Allergies reviewed: yes, no new allergies  Dietary changes reviewed: yes, no changes  Referrals reviewed: yes   Functional Questionnaire:        Activities of Daily Living (ADLs): yes, has help if needed    Any transportation issues/concerns?:    Any patient concerns? no   Confirmed importance and date/time of follow-up visits scheduled Patient is scheduled for May 3. However, Dr.Simpson is unavailable. Let patient know we will call with a new appt. He verbalized understanding.  Confirmed with patient if condition begins to worsen call PCP or go to the ER.  Patient was given the office number and encouraged to call back with question or concerns.  : yes with verbal understanding.

## 2017-07-05 DIAGNOSIS — Z4789 Encounter for other orthopedic aftercare: Secondary | ICD-10-CM | POA: Diagnosis not present

## 2017-07-05 DIAGNOSIS — M726 Necrotizing fasciitis: Secondary | ICD-10-CM | POA: Diagnosis not present

## 2017-07-06 ENCOUNTER — Ambulatory Visit: Payer: Self-pay | Admitting: Urology

## 2017-07-10 ENCOUNTER — Telehealth: Payer: Self-pay

## 2017-07-10 ENCOUNTER — Telehealth: Payer: Self-pay | Admitting: Family Medicine

## 2017-07-10 ENCOUNTER — Emergency Department (HOSPITAL_COMMUNITY): Payer: Medicare Other

## 2017-07-10 ENCOUNTER — Observation Stay (HOSPITAL_COMMUNITY)
Admission: EM | Admit: 2017-07-10 | Discharge: 2017-07-12 | Disposition: A | Discharge status: Home / Self Care | Payer: Medicare Other | Attending: Internal Medicine | Admitting: Internal Medicine

## 2017-07-10 ENCOUNTER — Encounter (HOSPITAL_COMMUNITY): Payer: Self-pay | Admitting: *Deleted

## 2017-07-10 DIAGNOSIS — Z7982 Long term (current) use of aspirin: Secondary | ICD-10-CM | POA: Diagnosis not present

## 2017-07-10 DIAGNOSIS — Z79899 Other long term (current) drug therapy: Secondary | ICD-10-CM | POA: Diagnosis not present

## 2017-07-10 DIAGNOSIS — E871 Hypo-osmolality and hyponatremia: Principal | ICD-10-CM | POA: Insufficient documentation

## 2017-07-10 DIAGNOSIS — E1122 Type 2 diabetes mellitus with diabetic chronic kidney disease: Secondary | ICD-10-CM | POA: Diagnosis not present

## 2017-07-10 DIAGNOSIS — E785 Hyperlipidemia, unspecified: Secondary | ICD-10-CM | POA: Diagnosis not present

## 2017-07-10 DIAGNOSIS — Z794 Long term (current) use of insulin: Secondary | ICD-10-CM | POA: Insufficient documentation

## 2017-07-10 DIAGNOSIS — I5022 Chronic systolic (congestive) heart failure: Secondary | ICD-10-CM | POA: Insufficient documentation

## 2017-07-10 DIAGNOSIS — I509 Heart failure, unspecified: Secondary | ICD-10-CM | POA: Diagnosis present

## 2017-07-10 DIAGNOSIS — I130 Hypertensive heart and chronic kidney disease with heart failure and stage 1 through stage 4 chronic kidney disease, or unspecified chronic kidney disease: Secondary | ICD-10-CM | POA: Diagnosis not present

## 2017-07-10 DIAGNOSIS — R0602 Shortness of breath: Secondary | ICD-10-CM | POA: Diagnosis not present

## 2017-07-10 DIAGNOSIS — G9341 Metabolic encephalopathy: Secondary | ICD-10-CM | POA: Diagnosis not present

## 2017-07-10 DIAGNOSIS — I5023 Acute on chronic systolic (congestive) heart failure: Secondary | ICD-10-CM | POA: Diagnosis not present

## 2017-07-10 DIAGNOSIS — R410 Disorientation, unspecified: Secondary | ICD-10-CM | POA: Insufficient documentation

## 2017-07-10 DIAGNOSIS — I2510 Atherosclerotic heart disease of native coronary artery without angina pectoris: Secondary | ICD-10-CM | POA: Diagnosis not present

## 2017-07-10 DIAGNOSIS — Z87891 Personal history of nicotine dependence: Secondary | ICD-10-CM | POA: Diagnosis not present

## 2017-07-10 DIAGNOSIS — Z95810 Presence of automatic (implantable) cardiac defibrillator: Secondary | ICD-10-CM | POA: Diagnosis present

## 2017-07-10 DIAGNOSIS — L03113 Cellulitis of right upper limb: Secondary | ICD-10-CM | POA: Diagnosis not present

## 2017-07-10 DIAGNOSIS — I10 Essential (primary) hypertension: Secondary | ICD-10-CM

## 2017-07-10 DIAGNOSIS — F418 Other specified anxiety disorders: Secondary | ICD-10-CM | POA: Diagnosis present

## 2017-07-10 DIAGNOSIS — E1129 Type 2 diabetes mellitus with other diabetic kidney complication: Secondary | ICD-10-CM | POA: Diagnosis present

## 2017-07-10 DIAGNOSIS — E876 Hypokalemia: Secondary | ICD-10-CM | POA: Diagnosis not present

## 2017-07-10 DIAGNOSIS — G2581 Restless legs syndrome: Secondary | ICD-10-CM | POA: Diagnosis not present

## 2017-07-10 DIAGNOSIS — E1165 Type 2 diabetes mellitus with hyperglycemia: Secondary | ICD-10-CM

## 2017-07-10 DIAGNOSIS — R159 Full incontinence of feces: Secondary | ICD-10-CM

## 2017-07-10 DIAGNOSIS — N184 Chronic kidney disease, stage 4 (severe): Secondary | ICD-10-CM | POA: Insufficient documentation

## 2017-07-10 DIAGNOSIS — M726 Necrotizing fasciitis: Secondary | ICD-10-CM | POA: Diagnosis not present

## 2017-07-10 DIAGNOSIS — IMO0001 Reserved for inherently not codable concepts without codable children: Secondary | ICD-10-CM | POA: Diagnosis present

## 2017-07-10 DIAGNOSIS — Z951 Presence of aortocoronary bypass graft: Secondary | ICD-10-CM | POA: Diagnosis not present

## 2017-07-10 HISTORY — DX: Type 2 diabetes mellitus with other diabetic kidney complication: E11.29

## 2017-07-10 LAB — CBC WITH DIFFERENTIAL/PLATELET
BASOS ABS: 0 10*3/uL (ref 0.0–0.1)
Basophils Relative: 0 %
EOS PCT: 0 %
Eosinophils Absolute: 0 10*3/uL (ref 0.0–0.7)
HCT: 24.7 % — ABNORMAL LOW (ref 39.0–52.0)
HEMOGLOBIN: 8.2 g/dL — AB (ref 13.0–17.0)
LYMPHS PCT: 14 %
Lymphs Abs: 1.5 10*3/uL (ref 0.7–4.0)
MCH: 28.7 pg (ref 26.0–34.0)
MCHC: 33.2 g/dL (ref 30.0–36.0)
MCV: 86.4 fL (ref 78.0–100.0)
Monocytes Absolute: 0.4 10*3/uL (ref 0.1–1.0)
Monocytes Relative: 4 %
NEUTROS ABS: 8.9 10*3/uL — AB (ref 1.7–7.7)
NEUTROS PCT: 82 %
Platelets: 174 10*3/uL (ref 150–400)
RBC: 2.86 MIL/uL — AB (ref 4.22–5.81)
RDW: 17.2 % — ABNORMAL HIGH (ref 11.5–15.5)
WBC: 10.8 10*3/uL — AB (ref 4.0–10.5)

## 2017-07-10 LAB — COMPREHENSIVE METABOLIC PANEL
ALBUMIN: 3 g/dL — AB (ref 3.5–5.0)
ALK PHOS: 96 U/L (ref 38–126)
ALT: 31 U/L (ref 17–63)
AST: 24 U/L (ref 15–41)
Anion gap: 15 (ref 5–15)
BUN: 66 mg/dL — ABNORMAL HIGH (ref 6–20)
CO2: 24 mmol/L (ref 22–32)
Calcium: 8.3 mg/dL — ABNORMAL LOW (ref 8.9–10.3)
Chloride: 90 mmol/L — ABNORMAL LOW (ref 101–111)
Creatinine, Ser: 4.41 mg/dL — ABNORMAL HIGH (ref 0.61–1.24)
GFR calc Af Amer: 16 mL/min — ABNORMAL LOW (ref >60)
GFR calc non Af Amer: 14 mL/min — ABNORMAL LOW (ref >60)
Glucose, Bld: 218 mg/dL — ABNORMAL HIGH (ref 65–99)
Potassium: 2.9 mmol/L — ABNORMAL LOW (ref 3.5–5.1)
SODIUM: 129 mmol/L — AB (ref 135–145)
TOTAL PROTEIN: 6.8 g/dL (ref 6.5–8.1)
Total Bilirubin: 0.8 mg/dL (ref 0.3–1.2)

## 2017-07-10 LAB — BASIC METABOLIC PANEL
Anion gap: 13 (ref 5–15)
BUN: 67 mg/dL — ABNORMAL HIGH (ref 6–20)
CO2: 24 mmol/L (ref 22–32)
Calcium: 8.3 mg/dL — ABNORMAL LOW (ref 8.9–10.3)
Chloride: 94 mmol/L — ABNORMAL LOW (ref 101–111)
Creatinine, Ser: 4.37 mg/dL — ABNORMAL HIGH (ref 0.61–1.24)
GFR calc non Af Amer: 14 mL/min — ABNORMAL LOW (ref >60)
GFR, EST AFRICAN AMERICAN: 16 mL/min — AB (ref >60)
Glucose, Bld: 140 mg/dL — ABNORMAL HIGH (ref 65–99)
Potassium: 3.2 mmol/L — ABNORMAL LOW (ref 3.5–5.1)
Sodium: 131 mmol/L — ABNORMAL LOW (ref 135–145)

## 2017-07-10 LAB — I-STAT ARTERIAL BLOOD GAS, ED
ACID-BASE DEFICIT: 1 mmol/L (ref 0.0–2.0)
Bicarbonate: 23.5 mmol/L (ref 20.0–28.0)
O2 Saturation: 95 %
PH ART: 7.422 (ref 7.350–7.450)
TCO2: 25 mmol/L (ref 22–32)
pCO2 arterial: 36.1 mmHg (ref 32.0–48.0)
pO2, Arterial: 75 mmHg — ABNORMAL LOW (ref 83.0–108.0)

## 2017-07-10 LAB — URINALYSIS, ROUTINE W REFLEX MICROSCOPIC
Bacteria, UA: NONE SEEN
Bilirubin Urine: NEGATIVE
Glucose, UA: 150 mg/dL — AB
Ketones, ur: NEGATIVE mg/dL
Leukocytes, UA: NEGATIVE
Nitrite: NEGATIVE
Protein, ur: 30 mg/dL — AB
Specific Gravity, Urine: 1.006 (ref 1.005–1.030)
pH: 6 (ref 5.0–8.0)

## 2017-07-10 LAB — I-STAT CG4 LACTIC ACID, ED: Lactic Acid, Venous: 0.91 mmol/L (ref 0.5–1.9)

## 2017-07-10 MED ORDER — POTASSIUM CHLORIDE CRYS ER 20 MEQ PO TBCR
40.0000 meq | EXTENDED_RELEASE_TABLET | Freq: Once | ORAL | Status: AC
  Administered 2017-07-10: 40 meq via ORAL
  Filled 2017-07-10: qty 2

## 2017-07-10 MED ORDER — PROPYLHEXEDRINE NA INHA: 1.0000 | Freq: Two times a day (BID) | NASAL | Status: DC | PRN

## 2017-07-10 MED ORDER — MELATONIN 3 MG PO TABS
9.0000 mg | ORAL_TABLET | Freq: Every day | ORAL | Status: DC
  Administered 2017-07-11: 9 mg via ORAL
  Filled 2017-07-10 (×4): qty 3

## 2017-07-10 MED ORDER — POTASSIUM CHLORIDE 10 MEQ/100ML IV SOLN
10.0000 meq | INTRAVENOUS | Status: AC
  Administered 2017-07-11 (×2): 10 meq via INTRAVENOUS
  Filled 2017-07-10 (×2): qty 100

## 2017-07-10 MED ORDER — TECHNETIUM TO 99M ALBUMIN AGGREGATED
4.0000 | Freq: Once | INTRAVENOUS | Status: AC | PRN
  Administered 2017-07-10: 4 via INTRAVENOUS

## 2017-07-10 MED ORDER — FUROSEMIDE 80 MG PO TABS
80.0000 mg | ORAL_TABLET | Freq: Every day | ORAL | Status: DC
  Administered 2017-07-11 – 2017-07-12 (×2): 80 mg via ORAL
  Filled 2017-07-10: qty 4
  Filled 2017-07-10: qty 1

## 2017-07-10 MED ORDER — FLUTICASONE PROPIONATE 50 MCG/ACT NA SUSP
2.0000 | Freq: Every day | NASAL | Status: DC
  Administered 2017-07-11 – 2017-07-12 (×2): 2 via NASAL
  Filled 2017-07-10: qty 16

## 2017-07-10 MED ORDER — METHYLCELLULOSE 1 % OP SOLN: 1.0000 [drp] | Freq: Two times a day (BID) | OPHTHALMIC | Status: DC | PRN

## 2017-07-10 MED ORDER — FUROSEMIDE 10 MG/ML IJ SOLN: 40.0000 mg | Freq: Once | INTRAMUSCULAR | Status: DC

## 2017-07-10 MED ORDER — OMEGA-3-ACID ETHYL ESTERS 1 G PO CAPS: 1.0000 g | ORAL_CAPSULE | Freq: Every day | ORAL | Status: DC

## 2017-07-10 MED ORDER — NITROGLYCERIN 0.4 MG SL SUBL: 0.4000 mg | SUBLINGUAL_TABLET | SUBLINGUAL | Status: DC | PRN

## 2017-07-10 MED ORDER — FLUOXETINE HCL 20 MG PO CAPS: 20.0000 mg | ORAL_CAPSULE | Freq: Every day | ORAL | Status: DC

## 2017-07-10 MED ORDER — POTASSIUM CHLORIDE CRYS ER 20 MEQ PO TBCR: 20.0000 meq | EXTENDED_RELEASE_TABLET | Freq: Two times a day (BID) | ORAL | 0 refills | Status: DC

## 2017-07-10 MED ORDER — SODIUM CHLORIDE 0.9 % IV BOLUS (SEPSIS)
500.0000 mL | Freq: Once | INTRAVENOUS | Status: AC
  Administered 2017-07-10: 500 mL via INTRAVENOUS

## 2017-07-10 MED ORDER — SODIUM BICARBONATE 650 MG PO TABS
1300.0000 mg | ORAL_TABLET | Freq: Two times a day (BID) | ORAL | Status: DC
  Administered 2017-07-11 – 2017-07-12 (×3): 1300 mg via ORAL
  Filled 2017-07-10 (×5): qty 2

## 2017-07-10 MED ORDER — ASPIRIN 325 MG PO TABS
325.0000 mg | ORAL_TABLET | Freq: Every day | ORAL | Status: DC
  Administered 2017-07-11 – 2017-07-12 (×2): 325 mg via ORAL
  Filled 2017-07-10 (×2): qty 1

## 2017-07-10 MED ORDER — SODIUM CHLORIDE 0.9 % IV SOLN
1000.0000 mL | INTRAVENOUS | Status: DC
Start: 2017-07-10 — End: 2017-07-10
  Administered 2017-07-10: 1000 mL via INTRAVENOUS

## 2017-07-10 MED ORDER — INSULIN ASPART 100 UNIT/ML ~~LOC~~ SOLN
0.0000 [IU] | Freq: Three times a day (TID) | SUBCUTANEOUS | Status: DC
  Administered 2017-07-11 (×2): 5 [IU] via SUBCUTANEOUS
  Administered 2017-07-12: 2 [IU] via SUBCUTANEOUS
  Filled 2017-07-10 (×2): qty 1

## 2017-07-10 MED ORDER — TECHNETIUM TC 99M DIETHYLENETRIAME-PENTAACETIC ACID
30.0000 | Freq: Once | INTRAVENOUS | Status: AC | PRN
  Administered 2017-07-10: 30 via RESPIRATORY_TRACT

## 2017-07-10 MED ORDER — HEPARIN SODIUM (PORCINE) 5000 UNIT/ML IJ SOLN
5000.0000 [IU] | Freq: Three times a day (TID) | INTRAMUSCULAR | Status: DC
  Administered 2017-07-11 – 2017-07-12 (×4): 5000 [IU] via SUBCUTANEOUS
  Filled 2017-07-10 (×4): qty 1

## 2017-07-10 MED ORDER — FUROSEMIDE 10 MG/ML IJ SOLN
80.0000 mg | Freq: Once | INTRAMUSCULAR | Status: AC
  Administered 2017-07-10: 80 mg via INTRAVENOUS
  Filled 2017-07-10: qty 8

## 2017-07-10 MED ORDER — HYDRALAZINE HCL 20 MG/ML IJ SOLN: 5.0000 mg | INTRAMUSCULAR | Status: DC | PRN

## 2017-07-10 MED ORDER — METOPROLOL SUCCINATE ER 25 MG PO TB24
50.0000 mg | ORAL_TABLET | Freq: Every day | ORAL | Status: DC
  Administered 2017-07-11 – 2017-07-12 (×2): 50 mg via ORAL
  Filled 2017-07-10 (×2): qty 1
  Filled 2017-07-10: qty 2

## 2017-07-10 MED ORDER — ONDANSETRON HCL 4 MG PO TABS
4.0000 mg | ORAL_TABLET | Freq: Four times a day (QID) | ORAL | Status: DC | PRN
  Administered 2017-07-10 – 2017-07-11 (×2): 4 mg via ORAL
  Filled 2017-07-10 (×2): qty 1

## 2017-07-10 MED ORDER — ADULT MULTIVITAMIN W/MINERALS CH
1.0000 | ORAL_TABLET | Freq: Every day | ORAL | Status: DC
  Administered 2017-07-11 – 2017-07-12 (×2): 1 via ORAL
  Filled 2017-07-10 (×3): qty 1

## 2017-07-10 MED ORDER — DOXYCYCLINE HYCLATE 100 MG PO TABS
100.0000 mg | ORAL_TABLET | Freq: Two times a day (BID) | ORAL | Status: DC
  Administered 2017-07-11 – 2017-07-12 (×4): 100 mg via ORAL
  Filled 2017-07-10 (×4): qty 1

## 2017-07-10 MED ORDER — ZOLPIDEM TARTRATE 5 MG PO TABS: 5.0000 mg | ORAL_TABLET | Freq: Every evening | ORAL | Status: DC | PRN

## 2017-07-10 MED ORDER — ACETAMINOPHEN 325 MG PO TABS: 650.0000 mg | ORAL_TABLET | Freq: Three times a day (TID) | ORAL | Status: DC | PRN

## 2017-07-10 MED ORDER — BUSPIRONE HCL 10 MG PO TABS: 10.0000 mg | ORAL_TABLET | Freq: Two times a day (BID) | ORAL | Status: DC

## 2017-07-10 MED ORDER — VITAMIN C 500 MG PO TABS
1000.0000 mg | ORAL_TABLET | Freq: Every day | ORAL | Status: DC
Start: 2017-07-11 — End: 2017-07-12
  Administered 2017-07-11 – 2017-07-12 (×2): 1000 mg via ORAL
  Filled 2017-07-10 (×2): qty 2

## 2017-07-10 MED ORDER — INSULIN ASPART PROT & ASPART (70-30 MIX) 100 UNIT/ML ~~LOC~~ SUSP
14.0000 [IU] | Freq: Two times a day (BID) | SUBCUTANEOUS | Status: DC
Start: 2017-07-11 — End: 2017-07-12
  Administered 2017-07-11 – 2017-07-12 (×2): 14 [IU] via SUBCUTANEOUS
  Filled 2017-07-10: qty 10

## 2017-07-10 MED ORDER — ROSUVASTATIN CALCIUM 20 MG PO TABS
40.0000 mg | ORAL_TABLET | Freq: Every day | ORAL | Status: DC
  Administered 2017-07-11 – 2017-07-12 (×2): 40 mg via ORAL
  Filled 2017-07-10 (×2): qty 1
  Filled 2017-07-10: qty 2

## 2017-07-10 MED ORDER — PRAMIPEXOLE DIHYDROCHLORIDE 0.125 MG PO TABS
0.1250 mg | ORAL_TABLET | Freq: Every day | ORAL | Status: DC
  Filled 2017-07-10 (×3): qty 1

## 2017-07-10 MED ORDER — SODIUM CHLORIDE 0.9 % IV SOLN: 1000.0000 mL | INTRAVENOUS | Status: DC

## 2017-07-10 NOTE — Telephone Encounter (Signed)
Patient was offered appt today but declined and said he wasn't up for driving to [place] today but I told him if he changed his mind to call and we would give him an appt.

## 2017-07-10 NOTE — Discharge Instructions (Signed)
Follow-up with your primary care doctor to check on your electrolytes.  Take the potassium as prescribed.  Return to the emergency room as needed for any worsening symptoms.

## 2017-07-10 NOTE — ED Notes (Signed)
Otho tec  Wrapped arm.

## 2017-07-10 NOTE — ED Provider Notes (Addendum)
[facility] EMERGENCY DEPARTMENT Provider Note   CSN: [id number] Arrival date & time: 07/10/17  1116     History   Chief Complaint Chief Complaint  Patient presents with  . Shortness of Breath    HPI Matthew [name] is a 52 y.o. male.  HPI Pt presents to the ED complaining of shortness of breath.  Pt has a history of recently being in the hospital for recent arm surgery associated with an abscess.  He required I&D and several surgeries.  He was discharged on the 30th  Pt has been feeling short of breath since last week.  Pt states he has not been coughing much.  The shortness of breath is constant.  He has noticed some leg swelling that is more than usual.  He feels very fatigued.  family is concerned about how listless he appears.  No fevers.   Past Medical History:  Diagnosis Date  . Abnormality of gait 04/08/2013  . AICD (automatic cardioverter/defibrillator) present   . Anxiety    stress  . Anxiety and depression   . CAD (coronary artery disease)    a. OOH MI 6/11; presented with acute CHF; hosp course c/b VF arrest with VDRF, etc;   b s/p CABG: L-LAD/Dx, S-OM/dCFX;  c.Nuclear scan 8/12:  Inferior and inf-lat scar with minimal peri-infarct ischemia, EF 53%.; d.LHC 9/12:  LAD 90%, pD1 (small) 70%, prox large Dx 95%, L-LAD and Dx ok, CFX occluded, S-OM and CFX ok, RCA prox to prox/mid 60-70%.  Medical management     . Cardiac arrest - ventricular fibrillation 08/26/2009  . Carpal tunnel syndrome of right wrist   . Cellulitis 2012   spider bite  . Cellulitis 06/2017   right upper extremity  . Chronic systolic heart failure ([place])   . CKD (chronic kidney disease)   . Coma ([place])   . Diabetic coma with ketoacidosis ([place])    Occured in 1994 where he spent 30 days in a coma . He develpoed infection of his leg and developing pancreatitis during that  time   . DM2 (diabetes mellitus, type 2) ([place])   . Dysrhythmia   . Foot drop, bilateral 04/08/2013  . Headache(784.0)     sinus  . History of degenerative disc disease   . HLD (hyperlipidemia)   . HTN (hypertension)   . Hypercholesteremia   . Ischemic cardiomyopathy    echo 10/11:  inf-septal and apical HK, EF 35%, mod LAE  . Neuropathy   . Nocturnal leg cramps   . Nocturnal leg cramps 04/02/2017  . Obesity   . Pancreatitis 2008  . Periodic limb movement 04/02/2017  . 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 Presence of permanent cardiac pacemaker   . Retinal detachment    Bilateral, laser surgery on the right  . RLS (restless legs syndrome) 04/22/2015  . Thyroid disease   . Ulnar neuropathy at elbow    bilateral    Patient Active Problem List   Diagnosis Date Noted  . Cellulitis 06/26/2017  . Right forearm cellulitis 06/26/2017  . AKI (acute kidney injury) ([place]) 06/26/2017  . Skin infection, bacterial 04/14/2017  . Periodic limb movement 04/02/2017  . Nocturnal leg cramps 04/02/2017  . Dyslipidemia associated with type 2 diabetes mellitus ([place]) 09/26/2016  . Hypertension associated with stage 3 chronic kidney disease due to type 2 diabetes mellitus ([place]) 09/26/2016  . Uncontrolled type 2 diabetes mellitus with diabetic neuropathy, with long-term current use of insulin ([place]) 09/26/2016  . Uncontrolled type  2 diabetes mellitus with severe nonproliferative retinopathy and macular edema, without long-term current use of insulin ([place]) 09/26/2016  . Uncontrolled type 2 diabetes mellitus with stage 4 chronic kidney disease, with long-term current use of insulin ([place]) 09/26/2016  . Combined forms of age-related cataract of left eye 04/27/2016  . Combined forms of age-related cataract of right eye 04/13/2016  . CKD stage 4 due to type 2 diabetes mellitus ([place]) 04/11/2016  . RLS (restless legs syndrome) 04/22/2015  . Tubular adenoma of colon 01/07/2015  . Bilateral elbow joint pain 09/30/2013  . Left carpal tunnel syndrome 09/30/2013  . ED (erectile dysfunction) 09/30/2013  . Panic disorder with  agoraphobia 07/29/2013  . Allergic rhinitis 05/29/2013  . Back pain 05/13/2013  . DDD (degenerative disc disease) 05/13/2013  . Abnormality of gait 04/08/2013  . Diabetic polyneuropathy ([place]) 04/08/2013  . Foot drop, bilateral 04/08/2013  . Hip pain, right 03/23/2013  . Disequilibrium 03/23/2013  . Poor vision 11/28/2012  . OSA (obstructive sleep apnea) 02/02/2012  . Carotid bruit 11/01/2011  . Numbness and tingling in hands 11/01/2011  . Insomnia 07/28/2011  . Anxiety 07/23/2011  . Stress 07/23/2011  . Chronic systolic heart failure ([place])   . Automatic implantable cardioverter-defibrillator in situ 03/10/2010  . HYPERTENSION, BENIGN 12/28/2009  . VENTRICULAR TACHYCARDIA 12/28/2009  . Coronary atherosclerosis of native coronary artery 09/20/2009  . FATIGUE 09/14/2009  . Diabetes mellitus, insulin dependent (IDDM), uncontrolled ([place]) 05/04/2008  . Depression with anxiety 05/04/2008  . Hyperlipidemia 07/10/2007  . Morbid obesity ([place]) 07/10/2007  . NICOTINE ADDICTION 07/10/2007    Past Surgical History:  Procedure Laterality Date  . APPENDECTOMY  mid 1990  . CARDIAC DEFIBRILLATOR PLACEMENT    . CARDIAC SURGERY    . CARPAL TUNNEL WITH CUBITAL TUNNEL  02/22/2012   Procedure: CARPAL TUNNEL WITH CUBITAL TUNNEL;  Surgeon: Roseanne Kaufman, MD;  Location: [facility];  Service: Orthopedics;  Laterality: Right;  Right Carpal Tunnel Release/Right Cubital Tunnel Release and Transposition if Necessary with Flexor Pronator Release  . CATARACT EXTRACTION    . COLONOSCOPY WITH PROPOFOL N/A 11/17/2014   Procedure: COLONOSCOPY WITH PROPOFOL;  Surgeon: Manus Gunning, MD;  Location: [facility];  Service: Gastroenterology;  Laterality: N/A;  . COLONOSCOPY WITH PROPOFOL N/A 01/05/2015   Procedure: COLONOSCOPY WITH PROPOFOL;  Surgeon: Manus Gunning, MD;  Location: [facility];  Service: Gastroenterology;  Laterality: N/A;  . CORONARY ARTERY BYPASS GRAFT    . coronary artery bypass  grafting x4  june 29,2011   x 4  . I&D EXTREMITY Right 06/26/2017   Procedure: IRRIGATION AND DEBRIDEMENT RIGHT FOREARM;  Surgeon: Roseanne Kaufman, MD;  Location: [facility];  Service: Orthopedics;  Laterality: Right;  . I&D EXTREMITY Right 06/28/2017   Procedure: Repeat irrigation and debridement right forearm;  Surgeon: Roseanne Kaufman, MD;  Location: [facility];  Service: Orthopedics;  Laterality: Right;  . I&D EXTREMITY Right 06/30/2017   Procedure: Repeat irrigation and debridement right forearm and reconstruction as necessary;  Surgeon: Roseanne Kaufman, MD;  Location: [facility];  Service: Orthopedics;  Laterality: Right;  60 mins  . PACEMAKER PLACEMENT    . RETINAL DETACHMENT SURGERY Bilateral   . RETINAL DETACHMENT SURGERY Bilateral    3 months ago  . right sided abdominal cyst removal    . TONSILLECTOMY  1998  . ULNAR NERVE TRANSPOSITION Right         Home Medications    Prior to Admission medications   Medication Sig Start Date End Date Taking? Authorizing  Provider  acetaminophen (TYLENOL) 500 MG tablet Take 1,000 mg by mouth every 8 (eight) hours as needed for mild pain.    Yes [provider]  aspirin 325 MG tablet Take 325 mg by mouth daily.     Yes [provider]  baclofen (LIORESAL) 10 MG tablet Take 0.5 tablets (5 mg total) by mouth 2 (two) times daily. 07/03/17  Yes Emokpae, Courage, MD  busPIRone (BUSPAR) 10 MG tablet Take 1 tablet (10 mg total) by mouth 2 (two) times daily. 04/11/17  Yes Cloria Spring, MD  doxycycline (VIBRA-TABS) 100 MG tablet Take 1 tablet (100 mg total) by mouth 2 (two) times daily. 07/03/17  Yes Emokpae, Courage, MD  FLUoxetine (PROZAC) 20 MG capsule Take 1 capsule (20 mg total) by mouth daily. 04/11/17 04/11/18 Yes Cloria Spring, MD  fluticasone [facility]) 50 MCG/ACT nasal spray Place 2 sprays into both nostrils daily. 04/11/17  Yes [name], MD  furosemide (LASIX) 80 MG tablet Take 1 tablet (80 mg total) by mouth daily. 07/03/17  Yes  Emokpae, Courage, MD  insulin NPH-regular Human (NOVOLIN 70/30) (70-30) 100 UNIT/ML injection Inject 22 Units into the skin 2 (two) times daily with a meal. Patient taking differently: Inject 30 Units into the skin 2 (two) times daily with a meal.  07/03/17  Yes Emokpae, Courage, MD  Melatonin 10 MG TABS Take 10 mg by mouth at bedtime.   Yes [provider]  methylcellulose (ARTIFICIAL TEARS) 1 % ophthalmic solution Place 1 drop into both eyes 2 (two) times daily as needed (dry eyes).   Yes [provider]  metoprolol succinate (TOPROL-XL) 50 MG 24 hr tablet Take 1 tablet (50 mg total) by mouth daily. 07/03/17  Yes Emokpae, Courage, MD  Multiple Vitamin (MULTIVITAMIN) tablet Take 1 tablet by mouth daily.     Yes [provider]  nitroGLYCERIN (NITROSTAT) 0.4 MG SL tablet Place 1 tablet (0.4 mg total) under the tongue every 5 (five) minutes as needed for chest pain. 01/21/16 01/26/20 Yes Evans Lance, MD  ondansetron (ZOFRAN) 4 MG tablet Take 1 tablet (4 mg total) by mouth every 6 (six) hours as needed for nausea. 07/03/17  Yes Emokpae, Courage, MD  Propylhexedrine Darcella Gasman) INHA Place 1 each into the nose 2 (two) times daily as needed (congestion).    Yes [provider]  rosuvastatin (CRESTOR) 40 MG tablet Take 1 tablet (40 mg total) by mouth daily. 07/03/17  Yes Emokpae, Courage, MD  sodium bicarbonate 650 MG tablet Take 2 tablets (1,300 mg total) by mouth 2 (two) times daily. 07/03/17  Yes Emokpae, Courage, MD  vitamin C (VITAMIN C) 1000 MG tablet Take 1 tablet (1,000 mg total) by mouth daily. 07/04/17  Yes Roxan Hockey, MD  lactobacillus acidophilus & bulgar (LACTINEX) chewable tablet Chew 1 tablet by mouth 3 (three) times daily with meals. Patient not taking: Reported on 07/10/2017 07/03/17   Roxan Hockey, MD  Omega-3 Fatty Acids (FISH OIL BURP-LESS PO) Take 1 tablet by mouth daily.      [provider]  oxyCODONE (OXY IR/ROXICODONE) 5 MG immediate  release tablet Take 1 tablet (5 mg total) by mouth every 4 (four) hours as needed for severe pain (1 to 2 tabs as needed for pain). Patient not taking: Reported on 07/10/2017 07/03/17   Roxan Hockey, MD  potassium chloride SA (K-DUR,KLOR-CON) 20 MEQ tablet Take 1 tablet (20 mEq total) by mouth 2 (two) times daily. 07/10/17   Dorie Rank, MD  pramipexole (  MIRAPEX) 0.125 MG tablet Take 1 tablet (0.125 mg total) by mouth at bedtime. Patient not taking: Reported on 06/25/2017 04/02/17   Kathrynn Ducking, MD    Family History Family History  Problem Relation Age of Onset  . Allergies Mother   . Diabetes Mother   . Hypertension Mother   . Hyperlipidemia Mother   . Dementia Mother   . Colon cancer Mother   . Lung cancer Mother   . Kidney cancer Mother   . Colon polyps Mother   . Stroke Father   . Allergies Father   . Dementia Father   . Alcohol abuse Father   . Colon polyps Father   . Diabetes Sister   . Heart disease Sister   . Anxiety disorder Sister   . Cancer Maternal Grandmother        unknown type  . Schizophrenia Paternal Uncle   . Schizophrenia Cousin     Social History Social History   Tobacco Use  . Smoking status: Former Smoker    Types: Cigars  . Smokeless tobacco: Current User    Types: Snuff  . Tobacco comment: Occasional cigar  Substance Use Topics  . Alcohol use: No    Alcohol/week: 0.0 oz    Comment: rarely  . Drug use: No     Allergies   Ace inhibitors; Cephalexin; and Beta adrenergic blockers   Review of Systems Review of Systems  All other systems reviewed and are negative.    Physical Exam Updated Vital Signs BP 107/61   Pulse (!) 56   Temp 97.9 F (36.6 C) (Oral)   Resp 16   SpO2 100%   Physical Exam  Constitutional: He appears listless. He appears ill. No distress.  Listless   HENT:  Head: Normocephalic and atraumatic.  Right Ear: External ear normal.  Left Ear: External ear normal.  Eyes: Conjunctivae are normal. Right eye  exhibits no discharge. Left eye exhibits no discharge. No scleral icterus.  Neck: Neck supple. No tracheal deviation present.  Cardiovascular: Normal rate, regular rhythm and intact distal pulses.  Pulmonary/Chest: Effort normal and breath sounds normal. No stridor. No respiratory distress. He has no wheezes. He has no rales.  Abdominal: Soft. Bowel sounds are normal. He exhibits no distension. There is no tenderness. There is no rebound and no guarding.  Musculoskeletal: He exhibits no tenderness.       Right lower leg: He exhibits edema. He exhibits no tenderness.       Left lower leg: He exhibits edema. He exhibits no tenderness.  Neurological: He appears listless. No cranial nerve deficit (no facial droop, extraocular movements intact, no slurred speech) or sensory deficit. He exhibits normal muscle tone. He displays no seizure activity. Coordination normal.  Slow to respond, falls asleep easily  Skin: Skin is warm and dry. No rash noted.  Psychiatric: He has a normal mood and affect.  Nursing note and vitals reviewed.    ED Treatments / Results  Labs (all labs ordered are listed, but only abnormal results are displayed) Labs Reviewed  COMPREHENSIVE METABOLIC PANEL - Abnormal; Notable for the following components:      Result Value   Sodium 129 (*)    Potassium 2.9 (*)    Chloride 90 (*)    Glucose, Bld 218 (*)    BUN 66 (*)    Creatinine, Ser 4.41 (*)    Calcium 8.3 (*)    Albumin 3.0 (*)    GFR calc non Af Amer 14 (*)  GFR calc Af Amer 16 (*)    All other components within normal limits  CBC WITH DIFFERENTIAL/PLATELET - Abnormal; Notable for the following components:   WBC 10.8 (*)    RBC 2.86 (*)    Hemoglobin 8.2 (*)    HCT 24.7 (*)    RDW 17.2 (*)    Neutro Abs 8.9 (*)    All other components within normal limits  URINALYSIS, ROUTINE W REFLEX MICROSCOPIC - Abnormal; Notable for the following components:   Color, Urine STRAW (*)    Glucose, UA 150 (*)    Hgb  urine dipstick SMALL (*)    Protein, ur 30 (*)    All other components within normal limits  I-STAT ARTERIAL BLOOD GAS, ED - Abnormal; Notable for the following components:   pO2, Arterial 75.0 (*)    All other components within normal limits  I-STAT CG4 LACTIC ACID, ED  I-STAT CG4 LACTIC ACID, ED    EKG EKG Interpretation  Date/Time:  Tuesday Jul 10 2017 11:34:23 EDT Ventricular Rate:  82 PR Interval:    QRS Duration: 104 QT Interval:  424 QTC Calculation: 495 R Axis:   -35 Text Interpretation:  sinus rhythm with PVC Left axis deviation Incomplete right bundle branch block Nonspecific ST and T wave abnormality Prolonged QT Abnormal ECG Confirmed by Dorie Rank [phone]) on 07/10/2017 3:48:33 PM   Radiology Dg Chest 2 View  Result Date: 07/10/2017 CLINICAL DATA:  52 year old male with shallow breathing, shortness of breath, intermittent confusion. Admitted due to infection. Surgical treatment a few weeks ago of right forearm and wrist necrotizing fasciitis. EXAM: CHEST - 2 VIEW COMPARISON:  Chest radiograph 09/16/2012 and earlier. FINDINGS: PA and lateral views of the chest. Stable to mildly lower lung volumes. Mild eventration of the right hemidiaphragm, normal variant. Stable cardiomegaly and mediastinal contours. Prior CABG. Stable left chest cardiac AICD. Visualized tracheal air column is within normal limits. No pneumothorax, pulmonary edema, pleural effusion or confluent pulmonary opacity. Negative visible bowel gas pattern. No acute osseous abnormality identified. IMPRESSION: No acute cardiopulmonary abnormality. Electronically Signed   By: Genevie Ann M.D.   On: 07/10/2017 12:18   Ct Head Wo Contrast  Result Date: 07/10/2017 CLINICAL DATA:  52 year old male with confusion and shortness of breath. Recent right arm surgery. EXAM: CT HEAD WITHOUT CONTRAST TECHNIQUE: Contiguous axial images were obtained from the base of the skull through the vertex without intravenous contrast. COMPARISON:   Head CT without contrast 04/18/2013 FINDINGS: Brain: No midline shift, ventriculomegaly, mass effect, evidence of mass lesion, intracranial hemorrhage or evidence of cortically based acute infarction. Gray-white matter differentiation is within normal limits throughout the brain. Cerebral volume has not significantly changed. Chronic partially empty sella. No cortical encephalomalacia identified. Vascular: Calcified atherosclerosis at the skull base. No suspicious intracranial vascular hyperdensity. Skull: No acute osseous abnormality identified. Sinuses/Orbits: Mild ethmoid and maxillary sinus mucosal thickening with otherwise stable and well pneumatized visible paranasal sinuses and mastoids. Other: Interval postoperative changes to both globes. No acute orbit or scalp soft tissue findings. Scattered calcified scalp vascular atherosclerosis. IMPRESSION: 1. Stable and negative noncontrast CT appearance of the brain. 2. Minimal paranasal sinus inflammation. Electronically Signed   By: Genevie Ann M.D.   On: 07/10/2017 18:56   Nm Pulmonary Vent And Perf (v/q Scan)  Result Date: 07/10/2017 CLINICAL DATA:  52 year old male with confusion and shortness of breath. Surgical treatment a few weeks ago of right forearm and wrist necrotizing fasciitis. EXAM: NUCLEAR MEDICINE VENTILATION - PERFUSION  LUNG SCAN TECHNIQUE: Ventilation images were obtained in multiple projections using inhaled aerosol Tc-13m DTPA. Perfusion images were obtained in multiple projections after intravenous injection of Tc-31m-MAA. RADIOPHARMACEUTICALS:  30 mCi of Tc-77m DTPA aerosol inhalation and 4 mCi Tc50m-MAA IV COMPARISON:  Chest radiographs 1203 hours today. FINDINGS: Cardiomegaly related photopenia. Ventilation: Good ventilation radiotracer distribution. No focal ventilation defect. Incidental gastric contamination. Perfusion: Homogeneous perfusion radiotracer activity. No perfusion defect. IMPRESSION: Normal VQ scan.  No evidence of pulmonary  embolus. Electronically Signed   By: Genevie Ann M.D.   On: 07/10/2017 18:58    Procedures .Critical Care Performed by: Dorie Rank, MD Authorized by: Dorie Rank, MD   Critical care provider statement:    Critical care time (minutes):  45   Critical care was time spent personally by me on the following activities:  Discussions with consultants, evaluation of patient's response to treatment, examination of patient, ordering and performing treatments and interventions, ordering and review of laboratory studies, ordering and review of radiographic studies, pulse oximetry, re-evaluation of patient's condition, obtaining history from patient or surrogate and review of old charts   (including critical care time)  Medications Ordered in ED Medications  sodium chloride 0.9 % bolus 500 mL (0 mLs Intravenous Stopped 07/10/17 1646)    Followed by  0.9 %  sodium chloride infusion (1,000 mLs Intravenous New Bag/Given 07/10/17 1622)  potassium chloride SA (K-DUR,KLOR-CON) CR tablet 40 mEq (40 mEq Oral Given 07/10/17 1613)  technetium TC 55M diethylenetriame-pentaacetic acid (DTPA) injection 30 millicurie (30 millicuries Inhalation Given 07/10/17 1710)  technetium albumin aggregated (MAA) injection solution 4 millicurie (4 millicuries Intravenous Contrast Given 07/10/17 1740)     Initial Impression / Assessment and Plan / ED Course  I have reviewed the triage vital signs and the nursing notes.  Pertinent labs & imaging results that were available during my care of the patient were reviewed by me and considered in my medical decision making (see chart for details).  Clinical Course as of Jul 10 2033  Tue Jul 10, 2017  1545 Labs notable for hyponatremia and hypokalemia. Hyperglycemia and chronic kidney disease unchanged.    [JK]  1546 Anemia stable.  Persistent leukocytosis.     [JK]    Clinical Course User Index [JK] Dorie Rank, MD    Patient presented to the emergency room for evaluation of some confusion  and fatigue associated with some shortness of breath.  Patient had an extensive evaluation in the emergency room.  He had a VQ scan to evaluate for possible pulmonary embolism considering his surgery as there is no other etiology for his dyspnea.  That was negative.  I also did a CT scan of the brain because of his family members concern for some confusion.  Patient also did seem fatigued.  CT scan was negative for any acute abnormalities.  Patient was monitored in the emergency room for several hours.  His mental status continued improved.  He was less fatigued and listless.  Possible his symptoms may been a combination of medication interactions in addition to  his hyponatremia and hypokalemia.  Patient was treated with IV fluids and potassium replacement.  He appears alert and awake at this time.  I discussed options of bring him in for observation versus going home with close follow-up.  Patient is feeling well and would prefer to go home at this time.   Pt attempted to get ready to go home.  He had an episode of urinary and fecal incontinence.  This  is not usual for him.  I think it would be best to bring him into the hospital for monitoring, correct his electrolytes and see if his sx improve. Final Clinical Impressions(s) / ED Diagnoses   Final diagnoses:  Hyponatremia  Hypokalemia      Dorie Rank, MD 07/10/17 2102  Cc addendum    Dorie Rank, MD 07/17/17 1253

## 2017-07-10 NOTE — ED Notes (Signed)
Pt transported for VQ scan. 

## 2017-07-10 NOTE — Telephone Encounter (Signed)
Spoke with Sister Joycelyn Schmid- she is going to pick up Jubal and take him to the [facility]

## 2017-07-10 NOTE — ED Notes (Signed)
Patient transported to CT 

## 2017-07-10 NOTE — Telephone Encounter (Signed)
I called the patient to advise him that he needed lab work done today and we could send the order to local lab , CBC and chem 7 . He said he 'just did not feel good" could not explain. Said blood sugar was 180 and kidney function was stage 5. I left a message on his sister's phone that she needed  To go to take him to [facility] as he saud she had promised to do so. After I called him back he verified that she would be there after a meeting that she had this morning which ended at 11: 30. I told him if he deteriorated before then he needed to call the emergency services  Pt's sister has called back the office to state she is going to collect her brother and take him to the hospital

## 2017-07-10 NOTE — H&P (Addendum)
History and Physical    Matthew [name] XLK:[id number] DOB: 08/06/1965 DOA: 07/10/2017  Referring MD/NP/PA:   PCP: [name], MD   Patient coming from:  The patient is coming from home.  At baseline, pt is independent for most of ADL.    Chief Complaint: Confusion, SOB  HPI: Matthew [name] is a 52 y.o. male with medical history significant of hypertension, hyperlipidemia, diabetes mellitus, CHF with a EF of 35%, depression, CAD, cardiac arrest, CKD-4, obesity, RLS, who presents with confusion and SOB  Patient was recently hospitalized from 4/20-4/30 due to right arm cellulitis. Per  discharge summary, pt is "s/p of nerve release fasciectomy and tenotomy of the FCU tendon. Per ortho, pt fascia was necrotic and he had massive amounts of thick purulent fluid and required an extensive debridement 06/26/17. The wound was left open. S/P I&D repeat right forearm tenosynovectomy 4/25/19and 06/30/17".  Treated with vancomycin and Zosyn, which wasPt was switched to doxycycline for 4 weeks at discharge.   Per report, pt has been intermittently confused in the past several days.  He does not have unilateral weakness, facial droop or slurred speech.  He moves all extremities normally.  He was noted to have shallow breathing, and complains of mild cough and mild SOB. No CP, fever or chills. When I saw pt in ED. he is mildly confused, but oriented x3 answers all questions appropriately.  He states that he is taking his doxycycline.  He is right arm pain is minimal, no fever or chills.  His left arm dressing was changed today by Ortho.  He states that he has a nausea, but no vomiting or abdominal pain.  He has 2 loose stool bowel movement today, currently no diarrhea.  He has a bilateral leg edema.  He states that he feels fatigue, but no unilateral weakness or numbness in extremities. Per EDP, patient had one episode of urinary incontinence in ED.  ED Course: pt was found to have WBC 10.8, negative  urinalysis, lactic acid of 0.91, potassium 2.9, sodium 129, stable renal function, temperature 97.5, bradycardia, oxygen saturation 100% on room air, negative chest x-ray, negative CT head for acute intracranial abnormalities, VQ scan is negative for PE.  Patient is admitted to telemetry bed for observation.  Review of Systems:   General: no fevers, chills, has fatigue HEENT: no blurry vision, hearing changes or sore throat Respiratory: has dyspnea, coughing, no wheezing CV: no chest pain, no palpitations GI: has nausea and loose stool, no vomiting, abdominal pain, constipation GU: no dysuria, burning on urination, increased urinary frequency, hematuria  Ext: has leg edema Neuro: no unilateral weakness, numbness, or tingling, no vision change or hearing loss. Has confusion. Skin: no rash, no skin tear. MSK: No muscle spasm, no deformity, no limitation of range of movement in spin Heme: No easy bruising.  Travel history: No recent long distant travel.  Allergy:  Allergies  Allergen Reactions  . Ace Inhibitors Cough  . Cephalexin Other (See Comments)    Resistant to antibiotic  . Beta Adrenergic Blockers Cough    Past Medical History:  Diagnosis Date  . Abnormality of gait 04/08/2013  . AICD (automatic cardioverter/defibrillator) present   . Anxiety    stress  . Anxiety and depression   . CAD (coronary artery disease)    a. OOH MI 6/11; presented with acute CHF; hosp course c/b VF arrest with VDRF, etc;   b s/p CABG: L-LAD/Dx, S-OM/dCFX;  c.Nuclear scan 8/12:  Inferior and inf-lat scar with  minimal peri-infarct ischemia, EF 53%.; d.LHC 9/12:  LAD 90%, pD1 (small) 70%, prox large Dx 95%, L-LAD and Dx ok, CFX occluded, S-OM and CFX ok, RCA prox to prox/mid 60-70%.  Medical management     . Cardiac arrest - ventricular fibrillation 08/26/2009  . Carpal tunnel syndrome of right wrist   . Cellulitis 2012   spider bite  . Cellulitis 06/2017   right upper extremity  . Chronic systolic  heart failure ([place])   . CKD (chronic kidney disease)   . Coma ([place])   . Diabetic coma with ketoacidosis ([place])    Occured in 1994 where he spent 30 days in a coma . He develpoed infection of his leg and developing pancreatitis during that  time   . DM2 (diabetes mellitus, type 2) ([place])   . Dysrhythmia   . Foot drop, bilateral 04/08/2013  . Headache(784.0)    sinus  . History of degenerative disc disease   . HLD (hyperlipidemia)   . HTN (hypertension)   . Hypercholesteremia   . Ischemic cardiomyopathy    echo 10/11:  inf-septal and apical HK, EF 35%, mod LAE  . Neuropathy   . Nocturnal leg cramps   . Nocturnal leg cramps 04/02/2017  . Obesity   . Pancreatitis 2008  . Periodic limb movement 04/02/2017  . 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 Presence of permanent cardiac pacemaker   . Retinal detachment    Bilateral, laser surgery on the right  . RLS (restless legs syndrome) 04/22/2015  . Thyroid disease   . 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 APPENDECTOMY  mid 1990  . CARDIAC DEFIBRILLATOR PLACEMENT    . CARDIAC SURGERY    . CARPAL TUNNEL WITH CUBITAL TUNNEL  02/22/2012   Procedure: CARPAL TUNNEL WITH CUBITAL TUNNEL;  Surgeon: Roseanne Kaufman, MD;  Location: [facility];  Service: Orthopedics;  Laterality: Right;  Right Carpal Tunnel Release/Right Cubital Tunnel Release and Transposition if Necessary with Flexor Pronator Release  . CATARACT EXTRACTION    . COLONOSCOPY WITH PROPOFOL N/A 11/17/2014   Procedure: COLONOSCOPY WITH PROPOFOL;  Surgeon: Manus Gunning, MD;  Location: [facility];  Service: Gastroenterology;  Laterality: N/A;  . COLONOSCOPY WITH PROPOFOL N/A 01/05/2015   Procedure: COLONOSCOPY WITH PROPOFOL;  Surgeon: Manus Gunning, MD;  Location: [facility];  Service: Gastroenterology;  Laterality: N/A;  . CORONARY ARTERY BYPASS GRAFT    . coronary artery bypass grafting x4  june 29,2011   x  4  . I&D EXTREMITY Right 06/26/2017   Procedure: IRRIGATION AND DEBRIDEMENT RIGHT FOREARM;  Surgeon: Roseanne Kaufman, MD;  Location: [place] [facility];  Service: Orthopedics;  Laterality: Right;  . I&D EXTREMITY Right 06/28/2017   Procedure: Repeat irrigation and debridement right forearm;  Surgeon: Roseanne Kaufman, MD;  Location: [facility];  Service: Orthopedics;  Laterality: Right;  . I&D EXTREMITY Right 06/30/2017   Procedure: Repeat irrigation and debridement right forearm and reconstruction as necessary;  Surgeon: Roseanne Kaufman, MD;  Location: [facility] [place];  Service: Orthopedics;  Laterality: Right;  60 mins  . PACEMAKER PLACEMENT    . RETINAL DETACHMENT SURGERY Bilateral   . RETINAL DETACHMENT SURGERY Bilateral    3 months ago  . right sided abdominal cyst removal    . TONSILLECTOMY  1998  . ULNAR NERVE TRANSPOSITION Right     Social History:  reports that he has quit smoking. His smoking use included cigars. His smokeless tobacco  use includes snuff. He reports that he does not drink alcohol or use drugs.  Family History:  Family History  Problem Relation Age of Onset  . Allergies Mother   . Diabetes Mother   . Hypertension Mother   . Hyperlipidemia Mother   . Dementia Mother   . Colon cancer Mother   . Lung cancer Mother   . Kidney cancer Mother   . Colon polyps Mother   . Stroke Father   . Allergies Father   . Dementia Father   . Alcohol abuse Father   . Colon polyps Father   . Diabetes Sister   . Heart disease Sister   . Anxiety disorder Sister   . Cancer Maternal Grandmother        unknown type  . Schizophrenia Paternal Uncle   . Schizophrenia Cousin      Prior to Admission medications   Medication Sig Start Date End Date Taking? Authorizing Provider  acetaminophen (TYLENOL) 500 MG tablet Take 1,000 mg by mouth every 8 (eight) hours as needed for mild pain.    Yes [provider]  aspirin 325 MG tablet Take 325 mg by mouth daily.     Yes [provider]  baclofen  (LIORESAL) 10 MG tablet Take 0.5 tablets (5 mg total) by mouth 2 (two) times daily. 07/03/17  Yes Emokpae, Courage, MD  busPIRone (BUSPAR) 10 MG tablet Take 1 tablet (10 mg total) by mouth 2 (two) times daily. 04/11/17  Yes Cloria Spring, MD  doxycycline (VIBRA-TABS) 100 MG tablet Take 1 tablet (100 mg total) by mouth 2 (two) times daily. 07/03/17  Yes Emokpae, Courage, MD  FLUoxetine (PROZAC) 20 MG capsule Take 1 capsule (20 mg total) by mouth daily. 04/11/17 04/11/18 Yes Cloria Spring, MD  fluticasone [facility]) 50 MCG/ACT nasal spray Place 2 sprays into both nostrils daily. 04/11/17  Yes [name], MD  furosemide (LASIX) 80 MG tablet Take 1 tablet (80 mg total) by mouth daily. 07/03/17  Yes Emokpae, Courage, MD  insulin NPH-regular Human (NOVOLIN 70/30) (70-30) 100 UNIT/ML injection Inject 22 Units into the skin 2 (two) times daily with a meal. Patient taking differently: Inject 30 Units into the skin 2 (two) times daily with a meal.  07/03/17  Yes Emokpae, Courage, MD  Melatonin 10 MG TABS Take 10 mg by mouth at bedtime.   Yes [provider]  methylcellulose (ARTIFICIAL TEARS) 1 % ophthalmic solution Place 1 drop into both eyes 2 (two) times daily as needed (dry eyes).   Yes [provider]  metoprolol succinate (TOPROL-XL) 50 MG 24 hr tablet Take 1 tablet (50 mg total) by mouth daily. 07/03/17  Yes Emokpae, Courage, MD  Multiple Vitamin (MULTIVITAMIN) tablet Take 1 tablet by mouth daily.     Yes [provider]  nitroGLYCERIN (NITROSTAT) 0.4 MG SL tablet Place 1 tablet (0.4 mg total) under the tongue every 5 (five) minutes as needed for chest pain. 01/21/16 01/26/20 Yes Evans Lance, MD  ondansetron (ZOFRAN) 4 MG tablet Take 1 tablet (4 mg total) by mouth every 6 (six) hours as needed for nausea. 07/03/17  Yes Emokpae, Courage, MD  Propylhexedrine Darcella Gasman) INHA Place 1 each into the nose 2 (two) times daily as needed (congestion).    Yes [provider]    rosuvastatin (CRESTOR) 40 MG tablet Take 1 tablet (40 mg total) by mouth daily. 07/03/17  Yes Emokpae, Courage, MD  sodium bicarbonate 650 MG tablet Take 2 tablets (1,300 mg  total) by mouth 2 (two) times daily. 07/03/17  Yes Emokpae, Courage, MD  vitamin C (VITAMIN C) 1000 MG tablet Take 1 tablet (1,000 mg total) by mouth daily. 07/04/17  Yes Roxan Hockey, MD  lactobacillus acidophilus & bulgar (LACTINEX) chewable tablet Chew 1 tablet by mouth 3 (three) times daily with meals. Patient not taking: Reported on 07/10/2017 07/03/17   Roxan Hockey, MD  Omega-3 Fatty Acids (FISH OIL BURP-LESS PO) Take 1 tablet by mouth daily.      [provider]  oxyCODONE (OXY IR/ROXICODONE) 5 MG immediate release tablet Take 1 tablet (5 mg total) by mouth every 4 (four) hours as needed for severe pain (1 to 2 tabs as needed for pain). Patient not taking: Reported on 07/10/2017 07/03/17   Roxan Hockey, MD  potassium chloride SA (K-DUR,KLOR-CON) 20 MEQ tablet Take 1 tablet (20 mEq total) by mouth 2 (two) times daily. 07/10/17   Dorie Rank, MD  pramipexole (MIRAPEX) 0.125 MG tablet Take 1 tablet (0.125 mg total) by mouth at bedtime. Patient not taking: Reported on 06/25/2017 04/02/17   Kathrynn Ducking, MD    Physical Exam: Vitals:   07/10/17 1730 07/10/17 1830 07/10/17 1900 07/10/17 2030  BP: 129/71 (!) 148/91 136/89 107/61  Pulse:      Resp: 16 18 15 16   Temp:      TempSrc:      SpO2:       General: Not in acute distress HEENT:       Eyes: PERRL, EOMI, no scleral icterus.       ENT: No discharge from the ears and nose, no pharynx injection, no tonsillar enlargement.        Neck: No JVD, no bruit, no mass felt. Heme: No neck lymph node enlargement. Cardiac: S1/S2, RRR, No murmurs, No gallops or rubs. Respiratory: No rales, wheezing, rhonchi or rubs. GI: Soft, nondistended, nontender, no rebound pain, no organomegaly, BS present. GU: No hematuria Ext: 2+ pitting leg edema bilaterally. 2+DP/PT  pulse bilaterally. Musculoskeletal: right arm with Volar splint and dressing is clean Skin: No rashes.  Neuro: mildly confused, but oriented X3, cranial nerves II-XII grossly intact, moves all extremities normally Psych: Patient is not psychotic, no suicidal or hemocidal ideation.  Labs on Admission: I have personally reviewed following labs and imaging studies  CBC: Recent Labs  Lab 07/10/17 1136  WBC 10.8*  NEUTROABS 8.9*  HGB 8.2*  HCT 24.7*  MCV 86.4  PLT 485   Basic Metabolic Panel: Recent Labs  Lab 07/10/17 1136 07/10/17 2323  NA 129* 131*  K 2.9* 3.2*  CL 90* 94*  CO2 24 24  GLUCOSE 218* 140*  BUN 66* 67*  CREATININE 4.41* 4.37*  CALCIUM 8.3* 8.3*  MG  --  1.3*   GFR: CrCl cannot be calculated (Unknown ideal weight.). Liver Function Tests: Recent Labs  Lab 07/10/17 1136  AST 24  ALT 31  ALKPHOS 96  BILITOT 0.8  PROT 6.8  ALBUMIN 3.0*   No results for input(s): LIPASE, AMYLASE in the last 168 hours. No results for input(s): AMMONIA in the last 168 hours. Coagulation Profile: No results for input(s): INR, PROTIME in the last 168 hours. Cardiac Enzymes: No results for input(s): CKTOTAL, CKMB, CKMBINDEX, TROPONINI in the last 168 hours. BNP (last 3 results) No results for input(s): PROBNP in the last 8760 hours. HbA1C: No results for input(s): HGBA1C in the last 72 hours. CBG: No results for input(s): GLUCAP in the last 168 hours. Lipid Profile: No  results for input(s): CHOL, HDL, LDLCALC, TRIG, CHOLHDL, LDLDIRECT in the last 72 hours. Thyroid Function Tests: No results for input(s): TSH, T4TOTAL, FREET4, T3FREE, THYROIDAB in the last 72 hours. Anemia Panel: No results for input(s): VITAMINB12, FOLATE, FERRITIN, TIBC, IRON, RETICCTPCT in the last 72 hours. Urine analysis:    Component Value Date/Time   COLORURINE STRAW (A) 07/10/2017 1919   APPEARANCEUR CLEAR 07/10/2017 1919   LABSPEC 1.006 07/10/2017 1919   PHURINE 6.0 07/10/2017 1919    GLUCOSEU 150 (A) 07/10/2017 1919   HGBUR SMALL (A) 07/10/2017 1919   BILIRUBINUR NEGATIVE 07/10/2017 [place] NEGATIVE 07/10/2017 1919   PROTEINUR 30 (A) 07/10/2017 1919   UROBILINOGEN 1.0 08/31/2009 1526   NITRITE NEGATIVE 07/10/2017 1919   LEUKOCYTESUR NEGATIVE 07/10/2017 1919   Sepsis Labs: @LABRCNTIP (procalcitonin:4,lacticidven:4) )No results found for this or any previous visit (from the past 240 hour(s)).   Radiological Exams on Admission: Dg Chest 2 View  Result Date: 07/10/2017 CLINICAL DATA:  52 year old male with shallow breathing, shortness of breath, intermittent confusion. Admitted due to infection. Surgical treatment a few weeks ago of right forearm and wrist necrotizing fasciitis. EXAM: CHEST - 2 VIEW COMPARISON:  Chest radiograph 09/16/2012 and earlier. FINDINGS: PA and lateral views of the chest. Stable to mildly lower lung volumes. Mild eventration of the right hemidiaphragm, normal variant. Stable cardiomegaly and mediastinal contours. Prior CABG. Stable left chest cardiac AICD. Visualized tracheal air column is within normal limits. No pneumothorax, pulmonary edema, pleural effusion or confluent pulmonary opacity. Negative visible bowel gas pattern. No acute osseous abnormality identified. IMPRESSION: No acute cardiopulmonary abnormality. Electronically Signed   By: Genevie Ann M.D.   On: 07/10/2017 12:18   Ct Head Wo Contrast  Result Date: 07/10/2017 CLINICAL DATA:  52 year old male with confusion and shortness of breath. Recent right arm surgery. EXAM: CT HEAD WITHOUT CONTRAST TECHNIQUE: Contiguous axial images were obtained from the base of the skull through the vertex without intravenous contrast. COMPARISON:  Head CT without contrast 04/18/2013 FINDINGS: Brain: No midline shift, ventriculomegaly, mass effect, evidence of mass lesion, intracranial hemorrhage or evidence of cortically based acute infarction. Gray-white matter differentiation is within normal limits  throughout the brain. Cerebral volume has not significantly changed. Chronic partially empty sella. No cortical encephalomalacia identified. Vascular: Calcified atherosclerosis at the skull base. No suspicious intracranial vascular hyperdensity. Skull: No acute osseous abnormality identified. Sinuses/Orbits: Mild ethmoid and maxillary sinus mucosal thickening with otherwise stable and well pneumatized visible paranasal sinuses and mastoids. Other: Interval postoperative changes to both globes. No acute orbit or scalp soft tissue findings. Scattered calcified scalp vascular atherosclerosis. IMPRESSION: 1. Stable and negative noncontrast CT appearance of the brain. 2. Minimal paranasal sinus inflammation. Electronically Signed   By: Genevie Ann M.D.   On: 07/10/2017 18:56   Nm Pulmonary Vent And Perf (v/q Scan)  Result Date: 07/10/2017 CLINICAL DATA:  52 year old male with confusion and shortness of breath. Surgical treatment a few weeks ago of right forearm and wrist necrotizing fasciitis. EXAM: NUCLEAR MEDICINE VENTILATION - PERFUSION LUNG SCAN TECHNIQUE: Ventilation images were obtained in multiple projections using inhaled aerosol Tc-45m DTPA. Perfusion images were obtained in multiple projections after intravenous injection of Tc-31m-MAA. RADIOPHARMACEUTICALS:  30 mCi of Tc-8m DTPA aerosol inhalation and 4 mCi Tc21m-MAA IV COMPARISON:  Chest radiographs 1203 hours today. FINDINGS: Cardiomegaly related photopenia. Ventilation: Good ventilation radiotracer distribution. No focal ventilation defect. Incidental gastric contamination. Perfusion: Homogeneous perfusion radiotracer activity. No perfusion defect. IMPRESSION: Normal VQ scan.  No evidence of  pulmonary embolus. Electronically Signed   By: Genevie Ann M.D.   On: 07/10/2017 18:58     EKG: Independently reviewed.  Sinus rhythm, QTC 495, LAD, PVC, poor R wave progression, low voltage.   Assessment/Plan Principal Problem:   Acute metabolic  encephalopathy Active Problems:   Diabetes mellitus, insulin dependent (IDDM), uncontrolled ([place])   Hyperlipidemia   Morbid obesity ([place])   Depression with anxiety   HYPERTENSION, BENIGN   Coronary atherosclerosis of native coronary artery   Automatic implantable cardioverter-defibrillator in situ   Acute on chronic systolic CHF (congestive heart failure) ([facility])   RLS (restless legs syndrome)   CKD stage 4 due to type 2 diabetes mellitus ([facility])   Right forearm cellulitis   Type II diabetes mellitus with renal manifestations ([facility])   Hyponatremia   Hypokalemia   Acute metabolic encephalopathy: Patient is mildly confused, but is still oriented x3.  He answers all questions appropriately.  He moves all extremities normally.  No focal neurologic findings on physical examination.  CT head is negative.  Less likely to have stroke.  Etiology is not clear, but likely due to multifactorial etiology, including electrolytes disturbance, hyponatremia, hypokalemia, stage IV renal failure, ongoing infection in the right arm cellulitis, possible polypharmacy (oxycodone, depression medications and baclofen use).  -Placed on telemetry bed for observation -Frequent neuro check -Treat underlying issues as below -Hold baclofen, BuSpar, Prozac, oxycodone  Hyponatremia: Patient has peripheral edema with 2+ leg edema.  He has hypervolemic hyponatremia.  Patient has stage IV CKD and CHF with EF of 35%, likely not responding to Lasix well.  Patient is taking Lasix 80 mg daily - Will check urine sodium, urine osmolality, serum osmolality. - check TSH - Fluid restriction to 1L - pt received 1L NS bolus in ED, will hold off IVF due to fluid overload - will one dose of IV lasix 80 mg x 1 - repeat BMP q8h - pt is on sodium chloride up to 1.3 g twice daily at home  Acute on chronic systolic congestive heart failure: Patient's shortness of breath is likely due to CHF exacerbation.  2D echo on 12/28/2009 showed EF of  35%.  Patient has  bilateral 2+ leg edema, indicating fluid overload, consistent with CHF exacerbation. -will give one dose of lasix 80 mg by IV now -monitoring Na level closely -continue home oral lasix in AM. -Get 2d echo  Diabetes mellitus, insulin dependent (IDDM), uncontrolled, with renal complications: Last R7E 9.3 on 07/01/17, poorly controled. Patient is taking 70/30 mixed insulin at home -will decrease 70/30 insulin dose from 22 to 14 units twice daily -SSI  HLD: -crestor  Depression with anxiety: -Hold BuSpar and Prozac due to altered mental status  HTN:  -Continue home medications: Metoprolol -IV hydralazine prn  Coronary atherosclerosis of native coronary artery: No CP -ASA, crestor  RLS (restless legs syndrome): -Pramipexole  CKD stage 4 due to type 2 diabetes mellitus ([place]): Stable.  Baseline creatinine 5.13 on 07/03/2017, his creatinine is 4.41, BUN 66. -Follow-up renal function by BMP  Right forearm cellulitis: No fever or chills, no worsening pain -continue Doxycycline  Hypokalemia: K=2.9 on admission. - Repleted - Check Mg level  DVT ppx: SQ Heparin   Code Status: Full code Family Communication: None at bed side.    Disposition Plan:  Anticipate discharge back to previous home environment Consults called:  none Admission status: Obs / tele    Date of Service 07/11/2017    [facility] Pager [phone]  If 7PM-7AM, please contact night-coverage [email or web address] Password [email or web address] 07/11/2017, 12:13 AM

## 2017-07-10 NOTE — Telephone Encounter (Signed)
noted 

## 2017-07-10 NOTE — Progress Notes (Signed)
Orthopedic Tech Progress Note Patient Details:  Geary Rufo 12-11-1965 [id number]  Ortho Devices Type of Ortho Device: Volar splint Ortho Device/Splint Location: Right Ortho Device/Splint Interventions: Application   Post Interventions Patient Tolerated: Well Instructions Provided: Adjustment of device, Care of device   Kristopher Oppenheim 07/10/2017, 7:53 PM

## 2017-07-10 NOTE — ED Triage Notes (Signed)
Pt alert and oriented x4 in triage, answering questions appropriately

## 2017-07-10 NOTE — Telephone Encounter (Signed)
Pt has returned to [facility] today rather than come in for office visait , I called him directly

## 2017-07-10 NOTE — ED Triage Notes (Signed)
Pt in c/o shallow breathing, states he is not doing well, intermittent confusion, pt had a recent procedure to his right arm and was admitted after due to an infection, pt states he is not doing any better since being at home- pt was discharged Tuesday

## 2017-07-10 NOTE — ED Notes (Signed)
Patient sleeping in chair. This tech was reassessing and trying to arouse patient. Patient did not respond. Sternal rubbed patient with little response. Patient finally became alert. Patient states he is so tired and does not feel pain. RN notified.

## 2017-07-11 ENCOUNTER — Observation Stay (HOSPITAL_BASED_OUTPATIENT_CLINIC_OR_DEPARTMENT_OTHER): Payer: Medicare Other

## 2017-07-11 DIAGNOSIS — N184 Chronic kidney disease, stage 4 (severe): Secondary | ICD-10-CM | POA: Diagnosis not present

## 2017-07-11 DIAGNOSIS — G9341 Metabolic encephalopathy: Secondary | ICD-10-CM | POA: Diagnosis not present

## 2017-07-11 DIAGNOSIS — E10649 Type 1 diabetes mellitus with hypoglycemia without coma: Secondary | ICD-10-CM

## 2017-07-11 DIAGNOSIS — E1122 Type 2 diabetes mellitus with diabetic chronic kidney disease: Secondary | ICD-10-CM | POA: Diagnosis not present

## 2017-07-11 DIAGNOSIS — I361 Nonrheumatic tricuspid (valve) insufficiency: Secondary | ICD-10-CM | POA: Diagnosis not present

## 2017-07-11 DIAGNOSIS — E871 Hypo-osmolality and hyponatremia: Secondary | ICD-10-CM | POA: Diagnosis not present

## 2017-07-11 LAB — CBC
HCT: 24.4 % — ABNORMAL LOW (ref 39.0–52.0)
HEMOGLOBIN: 8 g/dL — AB (ref 13.0–17.0)
MCH: 28.4 pg (ref 26.0–34.0)
MCHC: 32.8 g/dL (ref 30.0–36.0)
MCV: 86.5 fL (ref 78.0–100.0)
Platelets: 149 10*3/uL — ABNORMAL LOW (ref 150–400)
RBC: 2.82 MIL/uL — AB (ref 4.22–5.81)
RDW: 17.6 % — ABNORMAL HIGH (ref 11.5–15.5)
WBC: 8.1 10*3/uL (ref 4.0–10.5)

## 2017-07-11 LAB — BASIC METABOLIC PANEL
Anion gap: 14 (ref 5–15)
BUN: 67 mg/dL — AB (ref 6–20)
CHLORIDE: 95 mmol/L — AB (ref 101–111)
CO2: 27 mmol/L (ref 22–32)
Calcium: 8.4 mg/dL — ABNORMAL LOW (ref 8.9–10.3)
Creatinine, Ser: 4.39 mg/dL — ABNORMAL HIGH (ref 0.61–1.24)
GFR calc Af Amer: 16 mL/min — ABNORMAL LOW (ref >60)
GFR calc non Af Amer: 14 mL/min — ABNORMAL LOW (ref >60)
GLUCOSE: 114 mg/dL — AB (ref 65–99)
POTASSIUM: 3.3 mmol/L — AB (ref 3.5–5.1)
Sodium: 136 mmol/L (ref 135–145)

## 2017-07-11 LAB — CBG MONITORING, ED
GLUCOSE-CAPILLARY: 257 mg/dL — AB (ref 65–99)
GLUCOSE-CAPILLARY: 89 mg/dL (ref 65–99)
Glucose-Capillary: 105 mg/dL — ABNORMAL HIGH (ref 65–99)

## 2017-07-11 LAB — AMMONIA: AMMONIA: 16 umol/L (ref 9–35)

## 2017-07-11 LAB — SODIUM, URINE, RANDOM: Sodium, Ur: 61 mmol/L

## 2017-07-11 LAB — OSMOLALITY: OSMOLALITY: 297 mosm/kg — AB (ref 275–295)

## 2017-07-11 LAB — MAGNESIUM: Magnesium: 1.3 mg/dL — ABNORMAL LOW (ref 1.7–2.4)

## 2017-07-11 LAB — OSMOLALITY, URINE: Osmolality, Ur: 215 mOsm/kg — ABNORMAL LOW (ref 300–900)

## 2017-07-11 LAB — BRAIN NATRIURETIC PEPTIDE: B NATRIURETIC PEPTIDE 5: 240.9 pg/mL — AB (ref 0.0–100.0)

## 2017-07-11 LAB — TSH: TSH: 1.09 u[IU]/mL (ref 0.350–4.500)

## 2017-07-11 LAB — ECHOCARDIOGRAM COMPLETE

## 2017-07-11 MED ORDER — PERFLUTREN LIPID MICROSPHERE
1.0000 mL | INTRAVENOUS | Status: AC | PRN
  Administered 2017-07-11: 2 mL via INTRAVENOUS
  Filled 2017-07-11: qty 10

## 2017-07-11 MED ORDER — HYPROMELLOSE (GONIOSCOPIC) 2.5 % OP SOLN: 1.0000 [drp] | Freq: Two times a day (BID) | OPHTHALMIC | Status: DC | PRN

## 2017-07-11 MED ORDER — MAGNESIUM SULFATE 2 GM/50ML IV SOLN
2.0000 g | Freq: Once | INTRAVENOUS | Status: AC
  Administered 2017-07-11: 2 g via INTRAVENOUS
  Filled 2017-07-11: qty 50

## 2017-07-11 MED ORDER — FLUOXETINE HCL 20 MG PO CAPS
20.0000 mg | ORAL_CAPSULE | Freq: Every day | ORAL | Status: DC
  Administered 2017-07-11 – 2017-07-12 (×2): 20 mg via ORAL
  Filled 2017-07-11 (×2): qty 1

## 2017-07-11 MED ORDER — BUSPIRONE HCL 10 MG PO TABS
10.0000 mg | ORAL_TABLET | Freq: Two times a day (BID) | ORAL | Status: DC
  Administered 2017-07-11 – 2017-07-12 (×3): 10 mg via ORAL
  Filled 2017-07-11 (×5): qty 1

## 2017-07-11 MED ORDER — OXYMETAZOLINE HCL 0.05 % NA SOLN: 1.0000 | Freq: Two times a day (BID) | NASAL | Status: DC | PRN

## 2017-07-11 NOTE — Progress Notes (Signed)
  Echocardiogram 2D Echocardiogram has been performed.  Jennette Dubin 07/11/2017, 10:19 AM

## 2017-07-11 NOTE — ED Notes (Signed)
Patient being transported to echo at this time.

## 2017-07-11 NOTE — Progress Notes (Signed)
PROGRESS NOTE    Matthew [name]  UJW:[id number] DOB: 1965-07-28 DOA: 07/10/2017 PCP: [name], MD   Outpatient Specialists:     Brief Narrative:  Matthew [name] is a 52 y.o. male with medical history significant of hypertension, hyperlipidemia, diabetes mellitus, CHF with a EF of 35%, depression, CAD, cardiac arrest, CKD-4, obesity, RLS, who presents with confusion and SOB Recently hospitalized from 4/20-4/30 due to right arm cellulitis and s/p of nerve release fasciectomy and tenotomy of the FCU tendon. Per ortho, pt fascia was necrotic and he had massive amounts of thick purulent fluid and required an extensive debridement 06/26/17. The wound was left open. S/P I&D repeat right forearm tenosynovectomy 4/25/19and 06/30/17.  Treated with vancomycin and Zosyn, which was switched to doxycycline for 4 weeks at discharge.  Per report, pt has been intermittently confused in the past several days.       Assessment & Plan:   Principal Problem:   Acute metabolic encephalopathy Active Problems:   Diabetes mellitus, insulin dependent (IDDM), uncontrolled ([place])   Hyperlipidemia   Morbid obesity ([place])   Depression with anxiety   HYPERTENSION, BENIGN   Coronary atherosclerosis of native coronary artery   Automatic implantable cardioverter-defibrillator in situ   Acute on chronic systolic CHF (congestive heart failure) (HCC)   RLS (restless legs syndrome)   CKD stage 4 due to type 2 diabetes mellitus (HCC)   Right forearm cellulitis   Type II diabetes mellitus with renal manifestations (HCC)   Hyponatremia   Hypokalemia   Acute metabolic encephalopathy:  likely due to multifactorial etiology, including electrolytes disturbance, hyponatremia, hypokalemia, stage IV renal failure, ongoing infection in the right arm cellulitis, possible polypharmacy (oxycodone, depression medications and baclofen use). -Hold baclofen and oxycodone  Hyponatremia: Patient has peripheral edema with 2+  leg edema.  He has hypervolemic hyponatremia.  Patient has stage IV CKD and CHF with EF of 35%, likely not responding to Lasix well.  Patient is taking Lasix 80 mg daily -  TSH normal - pt is on sodium chloride up to 1.3 g twice daily at home  Acute on chronic systolic congestive heart failure: Patient's shortness of breath is likely due to CHF exacerbation.  2D echo on 12/28/2009 showed EF of 35% -given dose of IV lasix in ER and home oral lasix resumed - 2d echo pending  Diabetes mellitus, insulin dependent (IDDM), uncontrolled, with renal complications: Last F6O 9.3 on 07/01/17, poorly controled. Patient is taking 70/30 mixed insulin at home -SSI and resume at a lower dose  HLD: -crestor  Depression with anxiety: -resume home meds  HTN:  -Continue home medications -IV hydralazine prn  Coronary atherosclerosis of native coronary artery:  -ASA, crestor  RLS (restless legs syndrome): -Pramipexole  CKD stage 4 due to type 2 diabetes mellitus ([place]): Stable.   -monitor daily  Right forearm cellulitis:  -continue Doxycycline -dressing changed yesterday  Hypokalemia: K=2.9 on admission. - Repleted along with magnesium     DVT prophylaxis:  SQ Heparin  Code Status: Full Code   Family Communication:   Disposition Plan:     Consultants:       Subjective: Says he feels like he is back to baseline  Objective: Vitals:   07/11/17 0700 07/11/17 0715 07/11/17 0730 07/11/17 1101  BP: (!) 123/95  (!) 143/67 122/71  Pulse:  64 69 64  Resp: 15 20 10 15   Temp:      TempSrc:      SpO2:  96% 96% 99%  Intake/Output Summary (Last 24 hours) at 07/11/2017 1454 Last data filed at 07/11/2017 0040 Gross per 24 hour  Intake -  Output 1400 ml  Net -1400 ml   There were no vitals filed for this visit.  Examination:  General exam: Appears calm and comfortable  Respiratory system: Clear to auscultation. Respiratory effort normal. Cardiovascular system: S1  & S2 heard, RRR. No JVD, murmurs, rubs, gallops or clicks. No pedal edema. Gastrointestinal system: Abdomen is nondistended, soft and nontender. No organomegaly or masses felt. Normal bowel sounds heard. Central nervous system: Alert and oriented. No focal neurological deficits. Extremities: Symmetric 5 x 5 power. Skin: multiple scabs on LE Psychiatry: Judgement and insight appear normal. Mood & affect appropriate.     Data Reviewed: I have personally reviewed following labs and imaging studies  CBC: Recent Labs  Lab 07/10/17 1136 07/11/17 0521  WBC 10.8* 8.1  NEUTROABS 8.9*  --   HGB 8.2* 8.0*  HCT 24.7* 24.4*  MCV 86.4 86.5  PLT 174 102*   Basic Metabolic Panel: Recent Labs  Lab 07/10/17 1136 07/10/17 2323 07/11/17 1326  NA 129* 131* 136  K 2.9* 3.2* 3.3*  CL 90* 94* 95*  CO2 24 24 27   GLUCOSE 218* 140* 114*  BUN 66* 67* 67*  CREATININE 4.41* 4.37* 4.39*  CALCIUM 8.3* 8.3* 8.4*  MG  --  1.3*  --    GFR: CrCl cannot be calculated (Unknown ideal weight.). Liver Function Tests: Recent Labs  Lab 07/10/17 1136  AST 24  ALT 31  ALKPHOS 96  BILITOT 0.8  PROT 6.8  ALBUMIN 3.0*   No results for input(s): LIPASE, AMYLASE in the last 168 hours. Recent Labs  Lab 07/11/17 1326  AMMONIA 16   Coagulation Profile: No results for input(s): INR, PROTIME in the last 168 hours. Cardiac Enzymes: No results for input(s): CKTOTAL, CKMB, CKMBINDEX, TROPONINI in the last 168 hours. BNP (last 3 results) No results for input(s): PROBNP in the last 8760 hours. HbA1C: No results for input(s): HGBA1C in the last 72 hours. CBG: Recent Labs  Lab 07/11/17 0759 07/11/17 1218  GLUCAP 257* 89   Lipid Profile: No results for input(s): CHOL, HDL, LDLCALC, TRIG, CHOLHDL, LDLDIRECT in the last 72 hours. Thyroid Function Tests: Recent Labs    07/11/17 0521  TSH 1.090   Anemia Panel: No results for input(s): VITAMINB12, FOLATE, FERRITIN, TIBC, IRON, RETICCTPCT in the last 72  hours. Urine analysis:    Component Value Date/Time   COLORURINE STRAW (A) 07/10/2017 1919   APPEARANCEUR CLEAR 07/10/2017 1919   LABSPEC 1.006 07/10/2017 1919   PHURINE 6.0 07/10/2017 1919   GLUCOSEU 150 (A) 07/10/2017 1919   HGBUR SMALL (A) 07/10/2017 1919   BILIRUBINUR NEGATIVE 07/10/2017 [place] NEGATIVE 07/10/2017 1919   PROTEINUR 30 (A) 07/10/2017 1919   UROBILINOGEN 1.0 08/31/2009 1526   NITRITE NEGATIVE 07/10/2017 1919   LEUKOCYTESUR NEGATIVE 07/10/2017 1919     )No results found for this or any previous visit (from the past 240 hour(s)).    Anti-infectives (From admission, onward)   Start     Dose/Rate Route Frequency Ordered Stop   07/10/17 2245  doxycycline (VIBRA-TABS) tablet 100 mg     100 mg Oral 2 times daily 07/10/17 2233         Radiology Studies: Dg Chest 2 View  Result Date: 07/10/2017 CLINICAL DATA:  52 year old male with shallow breathing, shortness of breath, intermittent confusion. Admitted due to infection. Surgical treatment a few weeks  ago of right forearm and wrist necrotizing fasciitis. EXAM: CHEST - 2 VIEW COMPARISON:  Chest radiograph 09/16/2012 and earlier. FINDINGS: PA and lateral views of the chest. Stable to mildly lower lung volumes. Mild eventration of the right hemidiaphragm, normal variant. Stable cardiomegaly and mediastinal contours. Prior CABG. Stable left chest cardiac AICD. Visualized tracheal air column is within normal limits. No pneumothorax, pulmonary edema, pleural effusion or confluent pulmonary opacity. Negative visible bowel gas pattern. No acute osseous abnormality identified. IMPRESSION: No acute cardiopulmonary abnormality. Electronically Signed   By: Genevie Ann M.D.   On: 07/10/2017 12:18   Ct Head Wo Contrast  Result Date: 07/10/2017 CLINICAL DATA:  52 year old male with confusion and shortness of breath. Recent right arm surgery. EXAM: CT HEAD WITHOUT CONTRAST TECHNIQUE: Contiguous axial images were obtained from the  base of the skull through the vertex without intravenous contrast. COMPARISON:  Head CT without contrast 04/18/2013 FINDINGS: Brain: No midline shift, ventriculomegaly, mass effect, evidence of mass lesion, intracranial hemorrhage or evidence of cortically based acute infarction. Gray-white matter differentiation is within normal limits throughout the brain. Cerebral volume has not significantly changed. Chronic partially empty sella. No cortical encephalomalacia identified. Vascular: Calcified atherosclerosis at the skull base. No suspicious intracranial vascular hyperdensity. Skull: No acute osseous abnormality identified. Sinuses/Orbits: Mild ethmoid and maxillary sinus mucosal thickening with otherwise stable and well pneumatized visible paranasal sinuses and mastoids. Other: Interval postoperative changes to both globes. No acute orbit or scalp soft tissue findings. Scattered calcified scalp vascular atherosclerosis. IMPRESSION: 1. Stable and negative noncontrast CT appearance of the brain. 2. Minimal paranasal sinus inflammation. Electronically Signed   By: Genevie Ann M.D.   On: 07/10/2017 18:56   Nm Pulmonary Vent And Perf (v/q Scan)  Result Date: 07/10/2017 CLINICAL DATA:  52 year old male with confusion and shortness of breath. Surgical treatment a few weeks ago of right forearm and wrist necrotizing fasciitis. EXAM: NUCLEAR MEDICINE VENTILATION - PERFUSION LUNG SCAN TECHNIQUE: Ventilation images were obtained in multiple projections using inhaled aerosol Tc-63m DTPA. Perfusion images were obtained in multiple projections after intravenous injection of Tc-17m-MAA. RADIOPHARMACEUTICALS:  30 mCi of Tc-30m DTPA aerosol inhalation and 4 mCi Tc29m-MAA IV COMPARISON:  Chest radiographs 1203 hours today. FINDINGS: Cardiomegaly related photopenia. Ventilation: Good ventilation radiotracer distribution. No focal ventilation defect. Incidental gastric contamination. Perfusion: Homogeneous perfusion radiotracer  activity. No perfusion defect. IMPRESSION: Normal VQ scan.  No evidence of pulmonary embolus. Electronically Signed   By: Genevie Ann M.D.   On: 07/10/2017 18:58        Scheduled Meds: . aspirin  325 mg Oral Daily  . doxycycline  100 mg Oral BID  . fluticasone  2 spray Each Nare Daily  . furosemide  80 mg Oral Daily  . heparin  5,000 Units Subcutaneous Q8H  . insulin aspart  0-9 Units Subcutaneous TID WC  . insulin aspart protamine- aspart  14 Units Subcutaneous BID WC  . Melatonin  9 mg Oral QHS  . metoprolol succinate  50 mg Oral Daily  . multivitamin with minerals  1 tablet Oral Daily  . pramipexole  0.125 mg Oral QHS  . rosuvastatin  40 mg Oral Daily  . sodium bicarbonate  1,300 mg Oral BID  . ascorbic acid  1,000 mg Oral Daily   Continuous Infusions: . magnesium sulfate 1 - 4 g bolus IVPB       LOS: 0 days    Time spent: 35 min    Geradine Girt, DO [facility] Pager  [phone]  If 7PM-7AM, please contact night-coverage [email or web address] Password [email or web address] 07/11/2017, 2:54 PM

## 2017-07-11 NOTE — ED Notes (Signed)
Pt placed on a regular hospital bed and hooked back up to the monitor

## 2017-07-11 NOTE — ED Notes (Signed)
Ordered pt lunch tray 

## 2017-07-12 DIAGNOSIS — G9341 Metabolic encephalopathy: Secondary | ICD-10-CM | POA: Diagnosis not present

## 2017-07-12 LAB — BASIC METABOLIC PANEL
ANION GAP: 12 (ref 5–15)
BUN: 67 mg/dL — ABNORMAL HIGH (ref 6–20)
CHLORIDE: 96 mmol/L — AB (ref 101–111)
CO2: 28 mmol/L (ref 22–32)
Calcium: 8.5 mg/dL — ABNORMAL LOW (ref 8.9–10.3)
Creatinine, Ser: 4.54 mg/dL — ABNORMAL HIGH (ref 0.61–1.24)
GFR calc non Af Amer: 13 mL/min — ABNORMAL LOW (ref >60)
GFR, EST AFRICAN AMERICAN: 16 mL/min — AB (ref >60)
Glucose, Bld: 117 mg/dL — ABNORMAL HIGH (ref 65–99)
POTASSIUM: 3.2 mmol/L — AB (ref 3.5–5.1)
SODIUM: 136 mmol/L (ref 135–145)

## 2017-07-12 LAB — CBC
HCT: 23.5 % — ABNORMAL LOW (ref 39.0–52.0)
HEMOGLOBIN: 7.9 g/dL — AB (ref 13.0–17.0)
MCH: 29.3 pg (ref 26.0–34.0)
MCHC: 33.6 g/dL (ref 30.0–36.0)
MCV: 87 fL (ref 78.0–100.0)
Platelets: 140 10*3/uL — ABNORMAL LOW (ref 150–400)
RBC: 2.7 MIL/uL — AB (ref 4.22–5.81)
RDW: 17.8 % — ABNORMAL HIGH (ref 11.5–15.5)
WBC: 6.8 10*3/uL (ref 4.0–10.5)

## 2017-07-12 LAB — GLUCOSE, CAPILLARY
GLUCOSE-CAPILLARY: 119 mg/dL — AB (ref 65–99)
Glucose-Capillary: 157 mg/dL — ABNORMAL HIGH (ref 65–99)
Glucose-Capillary: 125 mg/dL — ABNORMAL HIGH (ref 65–99)

## 2017-07-12 MED ORDER — POTASSIUM CHLORIDE CRYS ER 20 MEQ PO TBCR: 20.0000 meq | EXTENDED_RELEASE_TABLET | Freq: Every day | ORAL | 0 refills | Status: DC

## 2017-07-12 MED ORDER — INSULIN NPH ISOPHANE & REGULAR (70-30) 100 UNIT/ML ~~LOC~~ SUSP: 12.0000 [IU] | Freq: Two times a day (BID) | SUBCUTANEOUS | 1 refills | Status: DC

## 2017-07-12 MED ORDER — LACTINEX PO CHEW: 1.0000 | CHEWABLE_TABLET | Freq: Three times a day (TID) | ORAL | 0 refills | Status: DC

## 2017-07-12 MED ORDER — POTASSIUM CHLORIDE CRYS ER 20 MEQ PO TBCR
40.0000 meq | EXTENDED_RELEASE_TABLET | Freq: Once | ORAL | Status: AC
  Administered 2017-07-12: 40 meq via ORAL
  Filled 2017-07-12: qty 2

## 2017-07-12 MED ORDER — FERROUS SULFATE 325 (65 FE) MG PO TABS: 325.0000 mg | ORAL_TABLET | Freq: Every day | ORAL | 3 refills | Status: DC

## 2017-07-12 MED ORDER — POLYVINYL ALCOHOL 1.4 % OP SOLN
1.0000 [drp] | OPHTHALMIC | Status: DC | PRN
  Filled 2017-07-12: qty 15

## 2017-07-12 NOTE — Discharge Summary (Addendum)
Physician Discharge Summary  Matthew [name] DJS:[id number] DOB: 11-Aug-1965 DOA: 07/10/2017  PCP: [name], MD  Admit date: 07/10/2017 Discharge date: 07/12/2017  Admitted From: Home  Disposition:  Home   Recommendations for Outpatient Follow-up:  1. Follow up with PCP in 1-2 weeks 2. Please obtain BMP/CBC in one week 3. Needs CBC to follow hb.  4. Needs close follow up with his nephrologist   Home Health:no  Discharge Condition: stable.  CODE STATUS: full code.  Diet recommendation: Heart Healthy   Brief/Interim Summary: Brief Narrative:  Matthew [name] a 52 y.o.malewith medical history significant ofhypertension, hyperlipidemia, diabetes mellitus, CHF with a EF of 35%, depression, CAD, cardiac arrest, CKD-4, obesity, RLS, who presents with confusionand SOB Recently hospitalized from 4/20-4/30 due toright armcellulitis and s/p ofnerve release fasciectomy and tenotomy of the FCU tendon. Per ortho, pt fascia was necrotic and he had massive amounts of thick purulent fluid and required an extensive debridement 06/26/17. The wound was left open.S/P I&D repeat right forearm tenosynovectomy 4/25/19and 06/30/17.Treated with vancomycin and Zosyn, which wasswitched to doxycyclinefor 4 weeks at discharge.  Per report, pt has beenintermittently confused in the past several days.     Assessment & Plan:   Principal Problem:   Acute metabolic encephalopathy Active Problems:   Diabetes mellitus, insulin dependent (IDDM), uncontrolled ([place])   Hyperlipidemia   Morbid obesity ([place])   Depression with anxiety   HYPERTENSION, BENIGN   Coronary atherosclerosis of native coronary artery   Automatic implantable cardioverter-defibrillator in situ   Acute on chronic systolic CHF (congestive heart failure) (HCC)   RLS (restless legs syndrome)   CKD stage 4 due to type 2 diabetes mellitus (HCC)   Right forearm cellulitis   Type II diabetes mellitus with renal manifestations  (HCC)   Hyponatremia   Hypokalemia   Acute metabolic encephalopathy: likely due to multifactorial etiology, including electrolytes disturbance, hyponatremia, hypokalemia, stage IV renal failure, ongoing infection in the right arm cellulitis,possible polypharmacy (oxycodone, depression medicationsandbaclofen use). -Hold baclofen and oxycodone -resolved. He report he has not been taking oxycodone.   Hyponatremia:Patient has peripheral edema with 2+ leg edema.He has hypervolemic hyponatremia.Patient has stage IV CKD and CHF with EF of 35%, Patient is taking Lasix 80 mg daily -  TSH normal - pt is onsodium chloride up to 1.3 g twice daily at home  Acute on chronic systolic congestive heart failure:Patient's shortness of breath is likely due to CHF exacerbation. 2D echo on 12/28/2009 showed EF of 35% -given dose of IV lasix in ER and home oral lasix resumed - 2d echo improved EF 475 %  Diabetes mellitus, insulin dependent (IDDM), uncontrolled, with renal complications:Last Y8F0.2 on 07/01/17, poorly controled. Patient is taking70/30 mixed insulinat home -12 units BID>   HLD: -crestor  Depression with anxiety: -resume home meds  HTN:  -Continue home medications -IV hydralazine prn  Coronary atherosclerosis of native coronary artery:  -ASA, crestor  RLS (restless legs syndrome): -Pramipexole  CKD stage 4 due to type 2 diabetes mellitus (HCC):Stable.  -monitor daily cr range per records at 4.  Stable. Urine out put 1.7.  Explain to patient he needs close follow up with nephrology and discussion regarding HD.  No uremic symptoms  Right forearm cellulitis: -continueDoxycycline -dressing changed  -follow up with ortho   Hypokalemia: K=2.9on admission. - Repleted along with magnesium      Discharge Diagnoses:  Principal Problem:   Acute metabolic encephalopathy Active Problems:   Diabetes mellitus, insulin dependent (IDDM),  uncontrolled ([place])  Hyperlipidemia   Morbid obesity ([place])   Depression with anxiety   HYPERTENSION, BENIGN   Coronary atherosclerosis of native coronary artery   Automatic implantable cardioverter-defibrillator in situ   Acute on chronic systolic CHF (congestive heart failure) (HCC)   RLS (restless legs syndrome)   CKD stage 4 due to type 2 diabetes mellitus ([place])   Right forearm cellulitis   Type II diabetes mellitus with renal manifestations ([place])   Hyponatremia   Hypokalemia    Discharge Instructions  Discharge Instructions    Diet - low sodium heart healthy   Complete by:  As directed    Increase activity slowly   Complete by:  As directed      Allergies as of 07/12/2017      Reactions   Ace Inhibitors Cough   Cephalexin Other (See Comments)   Resistant to antibiotic   Beta Adrenergic Blockers Cough      Medication List    STOP taking these medications   baclofen 10 MG tablet Commonly known as:  LIORESAL   oxyCODONE 5 MG immediate release tablet Commonly known as:  Oxy IR/ROXICODONE     TAKE these medications   acetaminophen 500 MG tablet Commonly known as:  TYLENOL Take 1,000 mg by mouth every 8 (eight) hours as needed for mild pain.   ascorbic acid 1000 MG tablet Commonly known as:  VITAMIN C Take 1 tablet (1,000 mg total) by mouth daily.   aspirin 325 MG tablet Take 325 mg by mouth daily.   BENZEDREX [place] Generic drug:  Propylhexedrine Place 1 each into the nose 2 (two) times daily as needed (congestion).   busPIRone 10 MG tablet Commonly known as:  BUSPAR Take 1 tablet (10 mg total) by mouth 2 (two) times daily.   doxycycline 100 MG tablet Commonly known as:  VIBRA-TABS Take 1 tablet (100 mg total) by mouth 2 (two) times daily.   ferrous sulfate 325 (65 FE) MG tablet Take 1 tablet (325 mg total) by mouth daily.   FISH OIL BURP-LESS PO Take 1 tablet by mouth daily.   FLUoxetine 20 MG capsule Commonly known as:  PROZAC Take 1 capsule  (20 mg total) by mouth daily.   fluticasone 50 MCG/ACT nasal spray Commonly known as:  FLONASE Place 2 sprays into both nostrils daily.   furosemide 80 MG tablet Commonly known as:  LASIX Take 1 tablet (80 mg total) by mouth daily.   insulin NPH-regular Human (70-30) 100 UNIT/ML injection Commonly known as:  NOVOLIN 70/30 Inject 12 Units into the skin 2 (two) times daily with a meal. What changed:  how much to take   lactobacillus acidophilus & bulgar chewable tablet Chew 1 tablet by mouth 3 (three) times daily with meals.   Melatonin 10 MG Tabs Take 10 mg by mouth at bedtime.   methylcellulose 1 % ophthalmic solution Commonly known as:  ARTIFICIAL TEARS Place 1 drop into both eyes 2 (two) times daily as needed (dry eyes).   metoprolol succinate 50 MG 24 hr tablet Commonly known as:  TOPROL-XL Take 1 tablet (50 mg total) by mouth daily.   multivitamin tablet Take 1 tablet by mouth daily.   nitroGLYCERIN 0.4 MG SL tablet Commonly known as:  NITROSTAT Place 1 tablet (0.4 mg total) under the tongue every 5 (five) minutes as needed for chest pain.   ondansetron 4 MG tablet Commonly known as:  ZOFRAN Take 1 tablet (4 mg total) by mouth every 6 (six) hours as needed for nausea.  potassium chloride SA 20 MEQ tablet Commonly known as:  K-DUR,KLOR-CON Take 1 tablet (20 mEq total) by mouth daily.   pramipexole 0.125 MG tablet Commonly known as:  MIRAPEX Take 1 tablet (0.125 mg total) by mouth at bedtime.   rosuvastatin 40 MG tablet Commonly known as:  CRESTOR Take 1 tablet (40 mg total) by mouth daily.   sodium bicarbonate 650 MG tablet Take 2 tablets (1,300 mg total) by mouth 2 (two) times daily.      Follow-up Information    [name], MD.   Specialty:  [facility] Contact information: [address], [address]  [place] [address] [phone]          Allergies  Allergen Reactions  . Ace Inhibitors Cough  . Cephalexin Other (See  Comments)    Resistant to antibiotic  . Beta Adrenergic Blockers Cough    Consultations:  none   Procedures/Studies: Dg Chest 2 View  Result Date: 07/10/2017 CLINICAL DATA:  52 year old male with shallow breathing, shortness of breath, intermittent confusion. Admitted due to infection. Surgical treatment a few weeks ago of right forearm and wrist necrotizing fasciitis. EXAM: CHEST - 2 VIEW COMPARISON:  Chest radiograph 09/16/2012 and earlier. FINDINGS: PA and lateral views of the chest. Stable to mildly lower lung volumes. Mild eventration of the right hemidiaphragm, normal variant. Stable cardiomegaly and mediastinal contours. Prior CABG. Stable left chest cardiac AICD. Visualized tracheal air column is within normal limits. No pneumothorax, pulmonary edema, pleural effusion or confluent pulmonary opacity. Negative visible bowel gas pattern. No acute osseous abnormality identified. IMPRESSION: No acute cardiopulmonary abnormality. Electronically Signed   By: Genevie Ann M.D.   On: 07/10/2017 12:18   Ct Head Wo Contrast  Result Date: 07/10/2017 CLINICAL DATA:  52 year old male with confusion and shortness of breath. Recent right arm surgery. EXAM: CT HEAD WITHOUT CONTRAST TECHNIQUE: Contiguous axial images were obtained from the base of the skull through the vertex without intravenous contrast. COMPARISON:  Head CT without contrast 04/18/2013 FINDINGS: Brain: No midline shift, ventriculomegaly, mass effect, evidence of mass lesion, intracranial hemorrhage or evidence of cortically based acute infarction. Gray-white matter differentiation is within normal limits throughout the brain. Cerebral volume has not significantly changed. Chronic partially empty sella. No cortical encephalomalacia identified. Vascular: Calcified atherosclerosis at the skull base. No suspicious intracranial vascular hyperdensity. Skull: No acute osseous abnormality identified. Sinuses/Orbits: Mild ethmoid and maxillary sinus mucosal  thickening with otherwise stable and well pneumatized visible paranasal sinuses and mastoids. Other: Interval postoperative changes to both globes. No acute orbit or scalp soft tissue findings. Scattered calcified scalp vascular atherosclerosis. IMPRESSION: 1. Stable and negative noncontrast CT appearance of the brain. 2. Minimal paranasal sinus inflammation. Electronically Signed   By: Genevie Ann M.D.   On: 07/10/2017 18:56   Ct Forearm Right Wo Contrast  Result Date: 06/26/2017 CLINICAL DATA:  Cellulitis of the volar mid forearm. EXAM: CT OF THE RIGHT FOREARM WITHOUT CONTRAST TECHNIQUE: Multidetector CT imaging was performed according to the standard protocol. Multiplanar CT image reconstructions were also generated. COMPARISON:  None. FINDINGS: Bones/Joint/Cartilage No acute fracture or dislocation. No osseous destruction. Joint spaces are preserved. Bone mineralization is normal. Ligaments Suboptimally assessed by CT. Muscles and Tendons There are foci of soft tissue gas with prominent fluid along the distal flexor carpi ulnaris myotendinous junction. There is enlargement and hypodensity of the distal flexor carpi ulnaris muscle. Remaining flexor and extensor muscles and tendons are grossly unremarkable. Soft tissues Prominent skin thickening and subcutaneous edema  along the volar forearm and wrist. Vascular calcifications. IMPRESSION: 1. Foci of soft tissue gas and prominent fluid along the distal flexor carpi ulnaris myotendinous junction with enlargement and hypodensity of the distal muscle, concerning for necrotizing fasciitis/myositis. 2. Prominent skin thickening and subcutaneous edema along the volar forearm and wrist, consistent with cellulitis given clinical history. Electronically Signed   By: Titus Dubin M.D.   On: 06/26/2017 14:39   US Renal  Result Date: 06/29/2017 CLINICAL DATA:  Renal failure EXAM: RENAL / URINARY TRACT ULTRASOUND COMPLETE COMPARISON:  Abdomen ultrasound, 07/26/2016  FINDINGS: Right Kidney: Length: 11.7 cm. There is borderline increased parenchymal echogenicity. Mild cortical thinning. Lobulated renal contour. No masses or stones. No hydronephrosis. Left Kidney: Length: 12.8 cm. There is borderline increased parenchymal echogenicity. Mild cortical thinning. Lobulated renal contour. No masses or stones. No hydronephrosis. Bladder: Appears normal for degree of bladder distention. IMPRESSION: 1. No acute findings.  No hydronephrosis. 2. Borderline increased renal parenchymal echogenicity. Mild renal cortical thinning. Findings suggest medical renal disease. Electronically Signed   By: Lajean Manes M.D.   On: 06/29/2017 16:38   Nm Pulmonary Vent And Perf (v/q Scan)  Result Date: 07/10/2017 CLINICAL DATA:  52 year old male with confusion and shortness of breath. Surgical treatment a few weeks ago of right forearm and wrist necrotizing fasciitis. EXAM: NUCLEAR MEDICINE VENTILATION - PERFUSION LUNG SCAN TECHNIQUE: Ventilation images were obtained in multiple projections using inhaled aerosol Tc-76m DTPA. Perfusion images were obtained in multiple projections after intravenous injection of Tc-29m-MAA. RADIOPHARMACEUTICALS:  30 mCi of Tc-54m DTPA aerosol inhalation and 4 mCi Tc53m-MAA IV COMPARISON:  Chest radiographs 1203 hours today. FINDINGS: Cardiomegaly related photopenia. Ventilation: Good ventilation radiotracer distribution. No focal ventilation defect. Incidental gastric contamination. Perfusion: Homogeneous perfusion radiotracer activity. No perfusion defect. IMPRESSION: Normal VQ scan.  No evidence of pulmonary embolus. Electronically Signed   By: Genevie Ann M.D.   On: 07/10/2017 18:58       Subjective: Feeling better. Wants to be discharge. He is alert and oriented. Denies dyspnea, diarrhea. Report mild nausea related to antibiotics.   Discharge Exam: Vitals:   07/12/17 0357 07/12/17 0833  BP: 131/83 129/62  Pulse: 66 72  Resp: 20 16  Temp: 97.9 F (36.6 C)  99.1 F (37.3 C)  SpO2: 97% 100%   Vitals:   07/11/17 2109 07/12/17 0016 07/12/17 0357 07/12/17 0833  BP: 129/69 126/74 131/83 129/62  Pulse: 66 68 66 72  Resp: 20 20 20 16   Temp: 98.7 F (37.1 C) 98.1 F (36.7 C) 97.9 F (36.6 C) 99.1 F (37.3 C)  TempSrc: Oral Oral Oral Oral  SpO2: 100% 96% 97% 100%    General: Pt is alert, awake, not in acute distress Cardiovascular: RRR, S1/S2 +, no rubs, no gallops Respiratory: CTA bilaterally, no wheezing, no rhonchi Abdominal: Soft, NT, ND, bowel sounds + Extremities: no edema, no cyanosis    The results of significant diagnostics from this hospitalization (including imaging, microbiology, ancillary and laboratory) are listed below for reference.     Microbiology: No results found for this or any previous visit (from the past 240 hour(s)).   Labs: BNP (last 3 results) Recent Labs    07/11/17 0521  BNP 809.9*   Basic Metabolic Panel: Recent Labs  Lab 07/10/17 1136 07/10/17 2323 07/11/17 1326 07/12/17 0450  NA 129* 131* 136 136  K 2.9* 3.2* 3.3* 3.2*  CL 90* 94* 95* 96*  CO2 24 24 27 28   GLUCOSE 218* 140* 114* 117*  BUN  66* 67* 67* 67*  CREATININE 4.41* 4.37* 4.39* 4.54*  CALCIUM 8.3* 8.3* 8.4* 8.5*  MG  --  1.3*  --   --    Liver Function Tests: Recent Labs  Lab 07/10/17 1136  AST 24  ALT 31  ALKPHOS 96  BILITOT 0.8  PROT 6.8  ALBUMIN 3.0*   No results for input(s): LIPASE, AMYLASE in the last 168 hours. Recent Labs  Lab 07/11/17 1326  AMMONIA 16   CBC: Recent Labs  Lab 07/10/17 1136 07/11/17 0521 07/12/17 0450  WBC 10.8* 8.1 6.8  NEUTROABS 8.9*  --   --   HGB 8.2* 8.0* 7.9*  HCT 24.7* 24.4* 23.5*  MCV 86.4 86.5 87.0  PLT 174 149* 140*   Cardiac Enzymes: No results for input(s): CKTOTAL, CKMB, CKMBINDEX, TROPONINI in the last 168 hours. BNP: Invalid input(s): POCBNP CBG: Recent Labs  Lab 07/11/17 0759 07/11/17 1218 07/11/17 1655 07/12/17 0646  GLUCAP 257* 89 105* 119*    D-Dimer No results for input(s): DDIMER in the last 72 hours. Hgb A1c No results for input(s): HGBA1C in the last 72 hours. Lipid Profile No results for input(s): CHOL, HDL, LDLCALC, TRIG, CHOLHDL, LDLDIRECT in the last 72 hours. Thyroid function studies Recent Labs    07/11/17 0521  TSH 1.090   Anemia work up No results for input(s): VITAMINB12, FOLATE, FERRITIN, TIBC, IRON, RETICCTPCT in the last 72 hours. Urinalysis    Component Value Date/Time   COLORURINE STRAW (A) 07/10/2017 1919   APPEARANCEUR CLEAR 07/10/2017 1919   LABSPEC 1.006 07/10/2017 1919   PHURINE 6.0 07/10/2017 1919   GLUCOSEU 150 (A) 07/10/2017 1919   HGBUR SMALL (A) 07/10/2017 1919   BILIRUBINUR NEGATIVE 07/10/2017 [place] NEGATIVE 07/10/2017 1919   PROTEINUR 30 (A) 07/10/2017 1919   UROBILINOGEN 1.0 08/31/2009 1526   NITRITE NEGATIVE 07/10/2017 1919   LEUKOCYTESUR NEGATIVE 07/10/2017 1919   Sepsis Labs Invalid input(s): PROCALCITONIN,  WBC,  LACTICIDVEN Microbiology No results found for this or any previous visit (from the past 240 hour(s)).   Time coordinating discharge: 35 minutes  SIGNED:   Elmarie Shiley, MD  [facility] 07/12/2017, 12:10 PM Pager   If 7PM-7AM, please contact night-coverage [email or web address] Password [email or web address]

## 2017-07-12 NOTE — Plan of Care (Signed)
Progressing with adls

## 2017-07-13 ENCOUNTER — Telehealth: Payer: Self-pay

## 2017-07-13 NOTE — Telephone Encounter (Signed)
[id number] attempted to speak with patient to complete TOC. Will try again later.

## 2017-07-13 NOTE — Telephone Encounter (Signed)
Patient lvm that he was returning your call. I called him back to let him know it was a TOC f/u call & that I would relay that he called back. [phone]

## 2017-07-16 ENCOUNTER — Other Ambulatory Visit: Payer: Self-pay | Admitting: Internal Medicine

## 2017-07-16 ENCOUNTER — Other Ambulatory Visit: Payer: Self-pay

## 2017-07-16 LAB — CULTURE, BLOOD (ROUTINE X 2)
CULTURE: NO GROWTH
CULTURE: NO GROWTH
SPECIAL REQUESTS: ADEQUATE
Special Requests: ADEQUATE

## 2017-07-16 NOTE — Telephone Encounter (Signed)
[id number] attempted to contact patient to complete TOC call. No answer left generic vm requesting call back. 3rd attempt.

## 2017-07-16 NOTE — Telephone Encounter (Signed)
[id number] Attempted to contact patient to complete TOC call. Will try again later.

## 2017-07-17 ENCOUNTER — Telehealth: Payer: Self-pay

## 2017-07-17 NOTE — Telephone Encounter (Signed)
[id number] attempted to return patients call to complete TOC call with no answer.

## 2017-07-23 DIAGNOSIS — E1169 Type 2 diabetes mellitus with other specified complication: Secondary | ICD-10-CM | POA: Diagnosis not present

## 2017-07-23 DIAGNOSIS — Z794 Long term (current) use of insulin: Secondary | ICD-10-CM | POA: Diagnosis not present

## 2017-07-23 DIAGNOSIS — E1122 Type 2 diabetes mellitus with diabetic chronic kidney disease: Secondary | ICD-10-CM | POA: Diagnosis not present

## 2017-07-23 DIAGNOSIS — I129 Hypertensive chronic kidney disease with stage 1 through stage 4 chronic kidney disease, or unspecified chronic kidney disease: Secondary | ICD-10-CM | POA: Diagnosis not present

## 2017-07-23 DIAGNOSIS — E1140 Type 2 diabetes mellitus with diabetic neuropathy, unspecified: Secondary | ICD-10-CM | POA: Diagnosis not present

## 2017-07-23 DIAGNOSIS — N185 Chronic kidney disease, stage 5: Secondary | ICD-10-CM | POA: Diagnosis not present

## 2017-07-23 DIAGNOSIS — E113413 Type 2 diabetes mellitus with severe nonproliferative diabetic retinopathy with macular edema, bilateral: Secondary | ICD-10-CM | POA: Diagnosis not present

## 2017-07-23 DIAGNOSIS — E785 Hyperlipidemia, unspecified: Secondary | ICD-10-CM | POA: Diagnosis not present

## 2017-07-23 DIAGNOSIS — N183 Chronic kidney disease, stage 3 (moderate): Secondary | ICD-10-CM | POA: Diagnosis not present

## 2017-07-23 DIAGNOSIS — E1165 Type 2 diabetes mellitus with hyperglycemia: Secondary | ICD-10-CM | POA: Diagnosis not present

## 2017-08-01 DIAGNOSIS — L02413 Cutaneous abscess of right upper limb: Secondary | ICD-10-CM | POA: Diagnosis not present

## 2017-08-07 ENCOUNTER — Encounter (HOSPITAL_COMMUNITY): Payer: Self-pay | Admitting: Psychiatry

## 2017-08-07 ENCOUNTER — Ambulatory Visit (INDEPENDENT_AMBULATORY_CARE_PROVIDER_SITE_OTHER): Payer: Medicare Other | Admitting: Psychiatry

## 2017-08-07 VITALS — BP 127/62 | HR 88 | Ht 69.0 in | Wt 262.0 lb

## 2017-08-07 DIAGNOSIS — Z79899 Other long term (current) drug therapy: Secondary | ICD-10-CM

## 2017-08-07 DIAGNOSIS — F329 Major depressive disorder, single episode, unspecified: Secondary | ICD-10-CM

## 2017-08-07 DIAGNOSIS — Z818 Family history of other mental and behavioral disorders: Secondary | ICD-10-CM

## 2017-08-07 DIAGNOSIS — F17220 Nicotine dependence, chewing tobacco, uncomplicated: Secondary | ICD-10-CM | POA: Diagnosis not present

## 2017-08-07 DIAGNOSIS — Z811 Family history of alcohol abuse and dependence: Secondary | ICD-10-CM

## 2017-08-07 DIAGNOSIS — F418 Other specified anxiety disorders: Secondary | ICD-10-CM | POA: Diagnosis not present

## 2017-08-07 MED ORDER — BUSPIRONE HCL 10 MG PO TABS: 10.0000 mg | ORAL_TABLET | Freq: Two times a day (BID) | ORAL | 3 refills | Status: DC

## 2017-08-07 MED ORDER — FLUOXETINE HCL 20 MG PO CAPS: 20.0000 mg | ORAL_CAPSULE | Freq: Every day | ORAL | 3 refills | Status: DC
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
## 2017-08-07 NOTE — Telephone Encounter (Signed)
Transition Care Management Follow-up Telephone Call   Date discharged?    07/12/2017            How have you been since you were released from the hospital? Fine   Do you understand why you were in the hospital? yes   Do you understand the discharge instructions?yes   Where were you discharged to? home   Items Reviewed:  Medications reviewed: yes  Allergies reviewed: yes  Dietary changes reviewed: yes  Referrals reviewed: yes    Functional Questionnaire:   Activities of Daily Living (ADLs):  yes    Any transportation issues/concerns?:no    Any patient concerns? no   Confirmed importance and date/time of follow-up visits scheduled Several attempts were made to contact the patient within 48 hours of his discharge from the hospital without success. He came into the office and let the receptionist know that someone had been trying to get in touch with him for a TOC call. He told me he had an appointment down the hall and came in our office to follow up about the phone call. I thanked him for following up and asked all of the questions. I made sure he knew of his follow up appt date and time with Dr.Simpson with verbal understanding.    Confirmed with patient if condition begins to worsen call PCP or go to the ER.  Patient was given the office number and encouraged to call back with question or concerns.  yes with verbal understanding

## 2017-08-08 DIAGNOSIS — L02413 Cutaneous abscess of right upper limb: Secondary | ICD-10-CM | POA: Diagnosis not present

## 2017-08-15 ENCOUNTER — Encounter: Payer: Self-pay | Admitting: Family Medicine

## 2017-08-15 ENCOUNTER — Ambulatory Visit (INDEPENDENT_AMBULATORY_CARE_PROVIDER_SITE_OTHER): Payer: Medicare Other | Admitting: Family Medicine

## 2017-08-15 VITALS — BP 124/76 | HR 77 | Resp 16 | Ht 69.0 in | Wt 257.0 lb

## 2017-08-15 DIAGNOSIS — G9341 Metabolic encephalopathy: Secondary | ICD-10-CM

## 2017-08-15 DIAGNOSIS — L089 Local infection of the skin and subcutaneous tissue, unspecified: Secondary | ICD-10-CM | POA: Diagnosis not present

## 2017-08-15 DIAGNOSIS — E10649 Type 1 diabetes mellitus with hypoglycemia without coma: Secondary | ICD-10-CM

## 2017-08-15 DIAGNOSIS — Z794 Long term (current) use of insulin: Secondary | ICD-10-CM | POA: Diagnosis not present

## 2017-08-15 DIAGNOSIS — IMO0002 Reserved for concepts with insufficient information to code with codable children: Secondary | ICD-10-CM

## 2017-08-15 DIAGNOSIS — I5023 Acute on chronic systolic (congestive) heart failure: Secondary | ICD-10-CM | POA: Diagnosis not present

## 2017-08-15 DIAGNOSIS — B9689 Other specified bacterial agents as the cause of diseases classified elsewhere: Secondary | ICD-10-CM

## 2017-08-15 DIAGNOSIS — E1140 Type 2 diabetes mellitus with diabetic neuropathy, unspecified: Secondary | ICD-10-CM

## 2017-08-15 DIAGNOSIS — I10 Essential (primary) hypertension: Secondary | ICD-10-CM

## 2017-08-15 DIAGNOSIS — I25119 Atherosclerotic heart disease of native coronary artery with unspecified angina pectoris: Secondary | ICD-10-CM | POA: Diagnosis not present

## 2017-08-15 DIAGNOSIS — E785 Hyperlipidemia, unspecified: Secondary | ICD-10-CM | POA: Diagnosis not present

## 2017-08-15 DIAGNOSIS — Z09 Encounter for follow-up examination after completed treatment for conditions other than malignant neoplasm: Secondary | ICD-10-CM | POA: Diagnosis not present

## 2017-08-15 DIAGNOSIS — E1165 Type 2 diabetes mellitus with hyperglycemia: Secondary | ICD-10-CM

## 2017-08-15 DIAGNOSIS — E559 Vitamin D deficiency, unspecified: Secondary | ICD-10-CM | POA: Diagnosis not present

## 2017-08-15 DIAGNOSIS — Z23 Encounter for immunization: Secondary | ICD-10-CM | POA: Diagnosis not present

## 2017-08-15 MED ORDER — MIRTAZAPINE 30 MG PO TABS: 30.0000 mg | ORAL_TABLET | Freq: Every day | ORAL | 3 refills | Status: DC

## 2017-08-15 NOTE — Patient Instructions (Addendum)
F/U same appt date as father 3 to 4 months please with MD, call if you need me sooner  Prevnar today  You are being referred to cardiology to be evaluated for heart failure  Please reschedule your appointment with the nephrologist  Thankful that your hand is improving  Labs today  Congrats on improved blood sugar , keep this up, this will help to lengthen thje life of your kidneys most   I am going to look for a social work  Scientific laboratory technician for you also
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
## 2017-08-16 ENCOUNTER — Encounter: Payer: Self-pay | Admitting: Family Medicine

## 2017-08-16 ENCOUNTER — Other Ambulatory Visit: Payer: Self-pay | Admitting: Family Medicine

## 2017-08-16 DIAGNOSIS — Z09 Encounter for follow-up examination after completed treatment for conditions other than malignant neoplasm: Secondary | ICD-10-CM | POA: Insufficient documentation

## 2017-08-16 DIAGNOSIS — Z23 Encounter for immunization: Secondary | ICD-10-CM | POA: Diagnosis not present

## 2017-08-16 LAB — COMPLETE METABOLIC PANEL WITH GFR
AG Ratio: 1.4 (calc) (ref 1.0–2.5)
ALBUMIN MSPROF: 3.8 g/dL (ref 3.6–5.1)
ALT: 12 U/L (ref 9–46)
AST: 12 U/L (ref 10–35)
Alkaline phosphatase (APISO): 123 U/L — ABNORMAL HIGH (ref 40–115)
BUN / CREAT RATIO: 12 (calc) (ref 6–22)
BUN: 38 mg/dL — AB (ref 7–25)
CALCIUM: 8.8 mg/dL (ref 8.6–10.3)
CHLORIDE: 104 mmol/L (ref 98–110)
CO2: 25 mmol/L (ref 20–32)
Creat: 3.12 mg/dL — ABNORMAL HIGH (ref 0.70–1.33)
GFR, EST AFRICAN AMERICAN: 25 mL/min/{1.73_m2} — AB (ref >=60)
GFR, EST NON AFRICAN AMERICAN: 22 mL/min/{1.73_m2} — AB (ref >=60)
GLUCOSE: 225 mg/dL — AB (ref 65–139)
Globulin: 2.8 g/dL (calc) (ref 1.9–3.7)
Potassium: 3.5 mmol/L (ref 3.5–5.3)
Sodium: 139 mmol/L (ref 135–146)
TOTAL PROTEIN: 6.6 g/dL (ref 6.1–8.1)
Total Bilirubin: 0.5 mg/dL (ref 0.2–1.2)

## 2017-08-16 LAB — VITAMIN D 25 HYDROXY (VIT D DEFICIENCY, FRACTURES): Vit D, 25-Hydroxy: 19 ng/mL — ABNORMAL LOW (ref 30–100)

## 2017-08-16 LAB — TSH: TSH: 1.14 mIU/L (ref 0.40–4.50)

## 2017-08-16 LAB — CBC
HCT: 29.6 % — ABNORMAL LOW (ref 38.5–50.0)
HEMOGLOBIN: 9.6 g/dL — AB (ref 13.2–17.1)
MCH: 27.8 pg (ref 27.0–33.0)
MCHC: 32.4 g/dL (ref 32.0–36.0)
MCV: 85.8 fL (ref 80.0–100.0)
MPV: 11.7 fL (ref 7.5–12.5)
PLATELETS: 133 10*3/uL — AB (ref 140–400)
RBC: 3.45 10*6/uL — AB (ref 4.20–5.80)
RDW: 14.7 % (ref 11.0–15.0)
WBC: 8.6 10*3/uL (ref 3.8–10.8)

## 2017-08-16 MED ORDER — ERGOCALCIFEROL 1.25 MG (50000 UT) PO CAPS: 50000.0000 [IU] | ORAL_CAPSULE | ORAL | 1 refills | Status: AC

## 2017-08-16 NOTE — Addendum Note (Signed)
Addended by: Eual Fines on: 08/16/2017 08:21 AM   Modules accepted: Orders

## 2017-08-16 NOTE — Assessment & Plan Note (Signed)
Improved, most  recent HBA1C is 7.5 in 07/2017 , followed by endo in [place]

## 2017-08-16 NOTE — Assessment & Plan Note (Signed)
Pt reports significant fluctuation in weight as well as intermittent leg swelling. Seems unaware of a diagnosis of heart failure.  Will refer to cardiology for follow up. Of note the echo cardiogram done during the recent hospital stay showed grade 2 diastolic dysfunction with an EF of 45%

## 2017-08-16 NOTE — Progress Notes (Signed)
Vitamin d

## 2017-08-16 NOTE — Assessment & Plan Note (Signed)
Infection from April to May, requiring repeat hospitalizations, now improved significantly improved, under close care of orthopedics

## 2017-08-16 NOTE — Assessment & Plan Note (Signed)
Repeat labs ordered at visit show improvement in both blood count and kidney function

## 2017-08-16 NOTE — Assessment & Plan Note (Signed)
Hospitalized from 5/7 to 07/12/2017. Course reviewed and questions answered . Pt states he felt he was near death's door at his last hospital stay, and was about giving up. Now feels better , but is still struggling with severely challenging health and social situations Social worker consult will be requested The priime problem of RUEcellulitis which led to an aabcess is markedly improved

## 2017-09-04 NOTE — Telephone Encounter (Signed)
Error

## 2017-09-07 ENCOUNTER — Ambulatory Visit (INDEPENDENT_AMBULATORY_CARE_PROVIDER_SITE_OTHER): Payer: Medicare Other | Admitting: *Deleted

## 2017-09-07 DIAGNOSIS — I472 Ventricular tachycardia: Secondary | ICD-10-CM | POA: Diagnosis not present

## 2017-09-07 DIAGNOSIS — I5022 Chronic systolic (congestive) heart failure: Secondary | ICD-10-CM

## 2017-09-07 DIAGNOSIS — I4729 Other ventricular tachycardia: Secondary | ICD-10-CM

## 2017-09-07 NOTE — Progress Notes (Signed)
Remote ICD transmission.   

## 2017-09-15 ENCOUNTER — Other Ambulatory Visit: Payer: Self-pay | Admitting: Internal Medicine

## 2017-09-17 NOTE — Telephone Encounter (Signed)
Pt's pharmacy is requesting a refill on furosemide 40 mg tablet. There is two Rx for furosemide. Please clarify which strength the pt is supposed to be taking. Please address

## 2017-09-17 NOTE — Telephone Encounter (Signed)
Per review of last hospitalization-Pt taking 80 mg daily.  Pt with f/u with EP APP in July.  Will fill for 30 days.

## 2017-09-18 ENCOUNTER — Other Ambulatory Visit: Payer: Self-pay | Admitting: Family Medicine

## 2017-09-21 ENCOUNTER — Ambulatory Visit: Payer: Medicare Other | Admitting: Podiatry

## 2017-09-25 DIAGNOSIS — N184 Chronic kidney disease, stage 4 (severe): Secondary | ICD-10-CM | POA: Diagnosis not present

## 2017-09-25 DIAGNOSIS — D649 Anemia, unspecified: Secondary | ICD-10-CM | POA: Diagnosis not present

## 2017-09-25 DIAGNOSIS — D631 Anemia in chronic kidney disease: Secondary | ICD-10-CM | POA: Diagnosis not present

## 2017-09-25 DIAGNOSIS — D696 Thrombocytopenia, unspecified: Secondary | ICD-10-CM | POA: Diagnosis not present

## 2017-09-27 ENCOUNTER — Encounter: Payer: Self-pay | Admitting: Physician Assistant

## 2017-10-03 ENCOUNTER — Ambulatory Visit: Payer: Medicare Other | Admitting: Podiatry

## 2017-10-03 LAB — CUP PACEART REMOTE DEVICE CHECK
Battery Remaining Longevity: 48 mo
Battery Remaining Percentage: 58 %
Brady Statistic RA Percent Paced: 1 %
Brady Statistic RV Percent Paced: 0 %
HIGH POWER IMPEDANCE MEASURED VALUE: 46 Ohm
Implantable Lead Implant Date: 20110706
Implantable Lead Location: 753860
Implantable Lead Model: 184
Implantable Lead Model: 5076
Lead Channel Impedance Value: 408 Ohm
Lead Channel Pacing Threshold Amplitude: 0.5 V
Lead Channel Pacing Threshold Amplitude: 0.9 V
Lead Channel Pacing Threshold Pulse Width: 0.4 ms
Lead Channel Pacing Threshold Pulse Width: 0.4 ms
Lead Channel Setting Pacing Amplitude: 2 V
Lead Channel Setting Pacing Pulse Width: 0.4 ms
MDC IDC LEAD IMPLANT DT: 20110706
MDC IDC LEAD LOCATION: 753859
MDC IDC LEAD SERIAL: 310740
MDC IDC MSMT LEADCHNL RV IMPEDANCE VALUE: 414 Ohm
MDC IDC PG IMPLANT DT: 20110706
MDC IDC SESS DTM: 20190704085100
MDC IDC SET LEADCHNL RV PACING AMPLITUDE: 2.4 V
MDC IDC SET LEADCHNL RV SENSING SENSITIVITY: 0.5 mV
Pulse Gen Serial Number: 168307

## 2017-10-05 DIAGNOSIS — D649 Anemia, unspecified: Secondary | ICD-10-CM | POA: Diagnosis not present

## 2017-10-05 DIAGNOSIS — N184 Chronic kidney disease, stage 4 (severe): Secondary | ICD-10-CM | POA: Diagnosis not present

## 2017-10-10 ENCOUNTER — Other Ambulatory Visit: Payer: Self-pay | Admitting: Neurology

## 2017-10-10 ENCOUNTER — Other Ambulatory Visit: Payer: Self-pay | Admitting: Internal Medicine

## 2017-10-15 DIAGNOSIS — L03113 Cellulitis of right upper limb: Secondary | ICD-10-CM | POA: Diagnosis not present

## 2017-10-15 DIAGNOSIS — M79602 Pain in left arm: Secondary | ICD-10-CM | POA: Diagnosis not present

## 2017-10-15 DIAGNOSIS — M79631 Pain in right forearm: Secondary | ICD-10-CM | POA: Diagnosis not present

## 2017-10-15 DIAGNOSIS — L02413 Cutaneous abscess of right upper limb: Secondary | ICD-10-CM | POA: Diagnosis not present

## 2017-10-26 ENCOUNTER — Other Ambulatory Visit: Payer: Self-pay | Admitting: Internal Medicine

## 2017-11-05 ENCOUNTER — Other Ambulatory Visit: Payer: Self-pay | Admitting: Internal Medicine

## 2017-11-06 ENCOUNTER — Ambulatory Visit (HOSPITAL_COMMUNITY): Payer: Medicare Other | Admitting: Psychiatry

## 2017-11-06 ENCOUNTER — Encounter (HOSPITAL_COMMUNITY): Payer: Self-pay | Admitting: Psychiatry

## 2017-11-06 VITALS — BP 141/80 | HR 89 | Ht 69.0 in | Wt 264.0 lb

## 2017-11-06 DIAGNOSIS — Z811 Family history of alcohol abuse and dependence: Secondary | ICD-10-CM

## 2017-11-06 DIAGNOSIS — F418 Other specified anxiety disorders: Secondary | ICD-10-CM | POA: Diagnosis not present

## 2017-11-06 DIAGNOSIS — Z87891 Personal history of nicotine dependence: Secondary | ICD-10-CM | POA: Diagnosis not present

## 2017-11-06 DIAGNOSIS — Z818 Family history of other mental and behavioral disorders: Secondary | ICD-10-CM

## 2017-11-06 MED ORDER — BUSPIRONE HCL 10 MG PO TABS: 10.0000 mg | ORAL_TABLET | Freq: Two times a day (BID) | ORAL | 3 refills | Status: AC

## 2017-11-06 MED ORDER — FLUOXETINE HCL 20 MG PO CAPS: 20.0000 mg | ORAL_CAPSULE | Freq: Every day | ORAL | 3 refills | Status: AC

## 2017-11-06 MED ORDER — MIRTAZAPINE 30 MG PO TABS: 30.0000 mg | ORAL_TABLET | Freq: Every day | ORAL | 3 refills | Status: AC
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
## 2017-11-09 ENCOUNTER — Ambulatory Visit: Payer: Medicare Other | Admitting: Physician Assistant

## 2017-11-09 ENCOUNTER — Telehealth: Payer: Self-pay | Admitting: *Deleted

## 2017-11-09 VITALS — BP 131/67 | HR 86 | Ht 69.0 in | Wt 272.0 lb

## 2017-11-09 DIAGNOSIS — I2510 Atherosclerotic heart disease of native coronary artery without angina pectoris: Secondary | ICD-10-CM | POA: Diagnosis not present

## 2017-11-09 DIAGNOSIS — Z95810 Presence of automatic (implantable) cardiac defibrillator: Secondary | ICD-10-CM

## 2017-11-09 DIAGNOSIS — I255 Ischemic cardiomyopathy: Secondary | ICD-10-CM | POA: Diagnosis not present

## 2017-11-09 DIAGNOSIS — I10 Essential (primary) hypertension: Secondary | ICD-10-CM | POA: Diagnosis not present

## 2017-11-09 DIAGNOSIS — R0681 Apnea, not elsewhere classified: Secondary | ICD-10-CM | POA: Diagnosis not present

## 2017-11-09 MED ORDER — METOPROLOL SUCCINATE ER 50 MG PO TB24: 50.0000 mg | ORAL_TABLET | Freq: Every day | ORAL | 4 refills | Status: AC

## 2017-11-09 MED ORDER — FUROSEMIDE 40 MG PO TABS: 80.0000 mg | ORAL_TABLET | Freq: Every day | ORAL | 4 refills | Status: DC

## 2017-11-09 MED ORDER — ROSUVASTATIN CALCIUM 40 MG PO TABS: 40.0000 mg | ORAL_TABLET | Freq: Every day | ORAL | 1 refills | Status: AC

## 2017-11-09 NOTE — Progress Notes (Signed)
Cardiology Office Note Date:  11/09/2017  Patient ID:  Matthew [name], DOB 03/25/1965, MRN [id number] PCP:  [name], MD  Electrophysiologist:  Dr. Lovena [name]    Chief Complaint: annual/over due device/EP visit  History of Present Illness: Matthew [name] is a 52 y.o. male with history of CAD (CABG), ICM w/chronic CHF, cardiac arrest w/ICD, DM, HTN, HLD, CKD (IV), morbid obesity.  He comes in today to be seen for dre. Matthew [name], last saw him in April 2018.  At that time reported "contnued class II HF", no shocks, NSVT, no changes were made to his tx. In April he developed cellulitis of his right hand and upper arm.  He had to have 3 surgeries for debridement.  He was on IV antibiotics.  (blood cx were neg x2)  When he went home he became confused and was found to have severe hyponatremia and had to be rehospitalized for several days in May    He is doing pretty well.  Tells me he has severe peripheral neuropathy b/l [name] that limit his ability to exercise, walks with a walking stick.  He is denies any CP, no near syncope or syncope.  Infrequently has some fleeting dizziness upon standing.  No rest SOB.  He does some yard work, no trouble with DOE with this or his ADLs.  He reports for many years he stops breathing, believes this goes back to his childhood.  He says he thinks he just forgets to breathe.  He does not describe PND or orthopnea.  He states his PMD monitors his lipid as well as his kidney function though recently referred to a nephrologist in [place], he cann not recall the name.  He keeps his sodium intake limited, reports weighing at home daily and his weight fluctuates 245-250, this AM at home was 250lbs.     Device information: BSCi dual chamber ICD, implanted 09/08/09  Past Medical History:  Diagnosis Date  . Abnormality of gait 04/08/2013  . AICD (automatic cardioverter/defibrillator) present   . Anxiety    stress  . Anxiety and depression   . CAD (coronary artery disease)     a. OOH MI 6/11; presented with acute CHF; hosp course c/b VF arrest with VDRF, etc;   b s/p CABG: L-LAD/Dx, S-OM/dCFX;  c.Nuclear scan 8/12:  Inferior and inf-lat scar with minimal peri-infarct ischemia, EF 53%.; d.LHC 9/12:  LAD 90%, pD1 (small) 70%, prox large Dx 95%, L-LAD and Dx ok, CFX occluded, S-OM and CFX ok, RCA prox to prox/mid 60-70%.  Medical management     . Cardiac arrest - ventricular fibrillation 08/26/2009  . Carpal tunnel syndrome of right wrist   . Cellulitis 2012   spider bite  . Cellulitis 06/2017   right upper extremity  . Chronic systolic heart failure ([place])   . CKD (chronic kidney disease)   . CKD stage 4 due to type 2 diabetes mellitus ([place]) 04/11/2016  . Coma ([place])   . Diabetic coma with ketoacidosis ([place])    Occured in 1994 where he spent 30 days in a coma . He develpoed infection of his leg and developing pancreatitis during that  time   . Diabetic polyneuropathy ([place]) 04/08/2013  . DM2 (diabetes mellitus, type 2) ([place])   . Dyslipidemia associated with type 2 diabetes mellitus ([place]) 09/26/2016  . Dysrhythmia   . Foot drop, bilateral 04/08/2013  . Headache(784.0)    sinus  . History of degenerative disc disease   . HLD (hyperlipidemia)   .  HTN (hypertension)   . Hypercholesteremia   . Hypertension associated with stage 3 chronic kidney disease due to type 2 diabetes mellitus ([place]) 09/26/2016  . Ischemic cardiomyopathy    echo 10/11:  inf-septal and apical HK, EF 35%, mod LAE  . Neuropathy   . Nocturnal leg cramps   . Nocturnal leg cramps 04/02/2017  . Obesity   . OSA (obstructive sleep apnea) 02/02/2012  . Pancreatitis 2008  . Periodic limb movement 04/02/2017  . 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 Presence of permanent cardiac pacemaker   . Retinal detachment    Bilateral, laser surgery on the right  . RLS (restless legs syndrome) 04/22/2015  . Thyroid disease   . Type II diabetes mellitus with renal manifestations ([place]) 07/10/2017  . Ulnar  neuropathy at elbow    bilateral  . Uncontrolled type 2 diabetes mellitus with diabetic neuropathy, with long-term current use of insulin ([place]) 09/26/2016  . Uncontrolled type 2 diabetes mellitus with severe nonproliferative retinopathy and macular edema, without long-term current use of insulin ([place]) 09/26/2016  . Uncontrolled type 2 diabetes mellitus with stage 4 chronic kidney disease, with long-term current use of insulin ([place]) 09/26/2016    Past Surgical History:  Procedure Laterality Date  . APPENDECTOMY  mid 1990  . CARDIAC DEFIBRILLATOR PLACEMENT    . CARDIAC SURGERY    . CARPAL TUNNEL WITH CUBITAL TUNNEL  02/22/2012   Procedure: CARPAL TUNNEL WITH CUBITAL TUNNEL;  Surgeon: Roseanne Kaufman, MD;  Location: [facility];  Service: Orthopedics;  Laterality: Right;  Right Carpal Tunnel Release/Right Cubital Tunnel Release and Transposition if Necessary with Flexor Pronator Release  . CATARACT EXTRACTION    . COLONOSCOPY WITH PROPOFOL N/A 11/17/2014   Procedure: COLONOSCOPY WITH PROPOFOL;  Surgeon: Manus Gunning, MD;  Location: [facility];  Service: Gastroenterology;  Laterality: N/A;  . COLONOSCOPY WITH PROPOFOL N/A 01/05/2015   Procedure: COLONOSCOPY WITH PROPOFOL;  Surgeon: Manus Gunning, MD;  Location: [facility];  Service: Gastroenterology;  Laterality: N/A;  . CORONARY ARTERY BYPASS GRAFT    . coronary artery bypass grafting x4  june 29,2011   x 4  . I&D EXTREMITY Right 06/26/2017   Procedure: IRRIGATION AND DEBRIDEMENT RIGHT FOREARM;  Surgeon: Roseanne Kaufman, MD;  Location: [facility];  Service: Orthopedics;  Laterality: Right;  . I&D EXTREMITY Right 06/28/2017   Procedure: Repeat irrigation and debridement right forearm;  Surgeon: Roseanne Kaufman, MD;  Location: [facility];  Service: Orthopedics;  Laterality: Right;  . I&D EXTREMITY Right 06/30/2017   Procedure: Repeat irrigation and debridement right forearm and reconstruction as necessary;  Surgeon: Roseanne Kaufman, MD;   Location: [facility];  Service: Orthopedics;  Laterality: Right;  60 mins  . PACEMAKER PLACEMENT    . RETINAL DETACHMENT SURGERY Bilateral   . RETINAL DETACHMENT SURGERY Bilateral    3 months ago  . right sided abdominal cyst removal    . TONSILLECTOMY  1998  . ULNAR NERVE TRANSPOSITION Right     Current Outpatient Medications  Medication Sig Dispense Refill  . acetaminophen (TYLENOL) 500 MG tablet Take 1,000 mg by mouth every 8 (eight) hours as needed for mild pain.     Marland Kitchen aspirin 325 MG tablet Take 325 mg by mouth daily.      . baclofen (LIORESAL) 20 MG tablet TAKE 1 TABLET BY MOUTH 3  TIMES DAILY 90 tablet 1  . busPIRone (BUSPAR) 10 MG tablet Take 1 tablet (10 mg total) by mouth 2 (two) times daily. 180 tablet 3  .  ergocalciferol (VITAMIN D2) 50000 units capsule Take 1 capsule (50,000 Units total) by mouth once a week. One capsule once weekly 12 capsule 1  . ferrous sulfate 325 (65 FE) MG tablet Take 1 tablet (325 mg total) by mouth daily. 30 tablet 3  . FLUoxetine (PROZAC) 20 MG capsule Take 1 capsule (20 mg total) by mouth daily. 90 capsule 3  . fluticasone (FLONASE) 50 MCG/ACT nasal spray Place 2 sprays into both nostrils daily. 16 g 3  . furosemide (LASIX) 40 MG tablet Take 2 tablets (80 mg total) by mouth daily. Please keep upcoming appt for future refills. 60 tablet 0  . insulin NPH-regular Human (NOVOLIN 70/30) (70-30) 100 UNIT/ML injection Inject 12 Units into the skin 2 (two) times daily with a meal. 10 mL 1  . lactobacillus acidophilus & bulgar (LACTINEX) chewable tablet Chew 1 tablet by mouth 3 (three) times daily with meals. 90 tablet 0  . Melatonin 10 MG TABS Take 10 mg by mouth at bedtime.    . methylcellulose (ARTIFICIAL TEARS) 1 % ophthalmic solution Place 1 drop into both eyes 2 (two) times daily as needed (dry eyes).    . metoprolol succinate (TOPROL-XL) 50 MG 24 hr tablet Take 1 tablet (50 mg total) by mouth daily. Please keep upcoming appt for future refills. Thank you 90  tablet 0  . mirtazapine (REMERON) 30 MG tablet Take 1 tablet (30 mg total) by mouth at bedtime. 90 tablet 3  . Multiple Vitamin (MULTIVITAMIN) tablet Take 1 tablet by mouth daily.      . nitroGLYCERIN (NITROSTAT) 0.4 MG SL tablet Place 1 tablet (0.4 mg total) under the tongue every 5 (five) minutes as needed for chest pain. 25 tablet 1  . Omega-3 Fatty Acids (FISH OIL BURP-LESS PO) Take 1 tablet by mouth daily.      . potassium chloride SA (K-DUR,KLOR-CON) 20 MEQ tablet Take 1 tablet (20 mEq total) by mouth daily. 10 tablet 0  . pramipexole (MIRAPEX) 0.125 MG tablet Take 1 tablet (0.125 mg total) by mouth at bedtime. 30 tablet 3  . Propylhexedrine (BENZEDREX) INHA Place 1 each into the nose 2 (two) times daily as needed (congestion).     . sodium bicarbonate 650 MG tablet Take 2 tablets (1,300 mg total) by mouth 2 (two) times daily. 120 tablet 2  . vitamin C (VITAMIN C) 1000 MG tablet Take 1 tablet (1,000 mg total) by mouth daily. 30 tablet 1   No current facility-administered medications for this visit.     Allergies:   Ace inhibitors; Cephalexin; and Beta adrenergic blockers   Social History:  The patient  reports that he has quit smoking. His smoking use included cigars. His smokeless tobacco use includes snuff. He reports that he does not drink alcohol or use drugs.   Family History:  The patient's family history includes Alcohol abuse in his father; Allergies in his father and mother; Anxiety disorder in his sister; Cancer in his maternal grandmother; Colon cancer in his mother; Colon polyps in his father and mother; Dementia in his father and mother; Diabetes in his mother and sister; Heart disease in his sister; Hyperlipidemia in his mother; Hypertension in his mother; Kidney cancer in his mother; Lung cancer in his mother; Schizophrenia in his cousin and paternal uncle; Stroke in his father.  ROS:  Please see the history of present illness.  All other systems are reviewed and otherwise  negative.   PHYSICAL EXAM:  VS:  BP 131/67   Pulse 86  Ht 5\' 9"  (1.753 m)   Wt 272 lb (123.4 kg)   BMI 40.17 kg/m  BMI: Body mass index is 40.17 kg/m. Well nourished, well developed, in no acute distress  HEENT: normocephalic, atraumatic  Neck: no JVD, carotid bruits or masses Cardiac: RRR; no significant murmurs, no rubs, or gallops Lungs:  CTA b/l, no wheezing, rhonchi or rales  Abd: soft, nontender MS: no deformity or atrophy Ext: trace edema, chronic looking skin changes Skin: warm and dry, no rash Neuro:  No gross deficits appreciated Psych: euthymic mood, full affect  ICD site is stable, no tethering or discomfort   EKG:  Not done today ICD interrogation done today and reviewed by myself; battery and lead measurements are good, no therapies, one 4 beat NSVT  07/11/17: TTE Study Conclusions - Left ventricle: The cavity size was normal. Wall thickness was   increased in a pattern of mild LVH. Systolic function was mildly   reduced. The estimated ejection fraction was in the range of 45%   to 50%. Incoordinate septal motion. Doppler parameters are   consistent with pseudonormal left ventricular relaxation (grade 2   diastolic dysfunction). The E/e&' ratio is >15, suggesting   elevated LV filling pressure. - Mitral valve: Mildly thickened leaflets . There was trivial   regurgitation. - Left atrium: The atrium was moderately dilated. - Right ventricle: Pacer wire or catheter noted in right ventricle. - Right atrium: The atrium was mildly dilated. Pacer wire or   catheter noted in right atrium. - Tricuspid valve: There was mild regurgitation. - Inferior vena cava: The vessel was normal in size. The   respirophasic diameter changes were in the normal range (>= 50%),   consistent with normal central venous pressure. Impressions: - Compared to prior studies, the LVEF is higher at 45-50% with   global hypokinesis and incoordinate septal motion. Pacer or AICD   wires are  noted. There is grade 2 DD with elevated LV filling   pressure and a dilated IVC.   Recent Labs: 07/10/2017: Magnesium 1.3 07/11/2017: B Natriuretic Peptide 240.9 08/15/2017: ALT 12; BUN 38; Creat 3.12; Hemoglobin 9.6; Platelets 133; Potassium 3.5; Sodium 139; TSH 1.14  03/30/2017: Cholesterol 127; HDL 32; LDL Cholesterol (Calc) 73; Total CHOL/HDL Ratio 4.0; Triglycerides 138   CrCl cannot be calculated (Patient's most recent lab result is older than the maximum 21 days allowed.).   Wt Readings from Last 3 Encounters:  11/09/17 272 lb (123.4 kg)  11/06/17 264 lb (119.7 kg)  08/15/17 257 lb (116.6 kg)     Other studies reviewed: Additional studies/records reviewed today include: summarized above  ASSESSMENT AND PLAN:  1. ICD     Hx of ICM, cardiac arrest     intact device function, no changes made  2. CAD      No anginal sounding symptoms      On ASA, BB, statin therapy  He reports being on high dose ASA since 2011, recommend reducing to 81mg  daily Refill need on his furosemide and metoprolol, reports K+ and all others are done via his PMD  3. HTN     Looks OK, no changes  4. ICM, Chronic CHF     Last TTE in May with some improvement of LVEF     No symptoms or exam findings to suggest fluid OL, trace edema sounds chroic, he reports being at his baseline       No changes, no ACE/ARB with CKD (IV)   Disposition: recommend sleep study, f/u  with Q 3 month remote checks, see [place] back in 56months, sooner if needed  Current medicines are reviewed at length with the patient today.  The patient did not have any concerns regarding medicines.  Venetia Night, PA-C 11/09/2017 1:48 PM     [address] [place] [address] [phone] (office)  [phone] (fax)

## 2017-11-09 NOTE — Patient Instructions (Addendum)
Medication Instructions:   STAT TAKING ASPIRIN 81 MG ONCE A DAY    If you need a refill on your cardiac medications before your next appointment, please call your pharmacy.  Labwork: NONE ORDERED  TODAY    Testing/Procedures: Your physician has recommended that you have a sleep study. This test records several body functions during sleep, including: brain activity, eye movement, oxygen and carbon dioxide blood levels, heart rate and rhythm, breathing rate and rhythm, the flow of air through your mouth and nose, snoring, body muscle movements, and chest and belly movement.      Follow-Up: Your physician wants you to follow-up in:  6 MONTHS WITH  ALLRED/URSUY You will receive a reminder letter in the mail two months in advance. If you don't receive a letter, please call our office to schedule the follow-up appointment.    Remote monitoring is used to monitor your Pacemaker of ICD from home. This monitoring reduces the number of office visits required to check your device to one time per year. It allows [place] to keep an eye on the functioning of your device to ensure it is working properly. You are scheduled for a device check from home on . 12-10-17 You may send your transmission at any time that day. If you have a wireless device, the transmission will be sent automatically. After your physician reviews your transmission, you will receive a postcard with your next transmission date.     Any Other Special Instructions Will Be Listed Below (If Applicable).

## 2017-11-09 NOTE — Telephone Encounter (Signed)
PT NEEDS SLEEP STUDY DX :APNEA  Claude Manges, CMA  P Cv Div Sleep Studies  Cc: Freada Bergeron, CMA

## 2017-11-12 ENCOUNTER — Telehealth: Payer: Self-pay | Admitting: *Deleted

## 2017-11-12 NOTE — Telephone Encounter (Signed)
Staff message sent to Matthew [name] per [facility] web portal no PA is required for split night study. Ok to schedule. [place] decision [id number].

## 2017-11-16 NOTE — Telephone Encounter (Signed)
Patient is scheduled for lab study on 12/15/17. Patient understands his sleep study will be done at [facility] sleep lab. Patient understands he will receive a sleep packet in a week or so. Patient understands to call if he does not receive the sleep packet in a timely manner. Patient agrees with treatment and thanked me for call.

## 2017-11-21 ENCOUNTER — Telehealth: Payer: Self-pay | Admitting: Internal Medicine

## 2017-11-21 ENCOUNTER — Other Ambulatory Visit: Payer: Self-pay | Admitting: Internal Medicine

## 2017-11-21 LAB — CUP PACEART INCLINIC DEVICE CHECK
Battery Remaining Longevity: 48 mo
Brady Statistic RV Percent Paced: 1 % — CL
Date Time Interrogation Session: 20190918140658
HighPow Impedance: 48 Ohm
Implantable Lead Implant Date: 20110706
Implantable Lead Location: 753860
Implantable Lead Serial Number: 310740
Lead Channel Pacing Threshold Amplitude: 0.5 V
Lead Channel Pacing Threshold Pulse Width: 0.4 ms
Lead Channel Sensing Intrinsic Amplitude: 1.6 mV
Lead Channel Setting Pacing Amplitude: 2.4 V
Lead Channel Setting Pacing Pulse Width: 0.4 ms
MDC IDC LEAD IMPLANT DT: 20110706
MDC IDC LEAD LOCATION: 753859
MDC IDC MSMT LEADCHNL RA IMPEDANCE VALUE: 432 Ohm
MDC IDC MSMT LEADCHNL RV IMPEDANCE VALUE: 417 Ohm
MDC IDC MSMT LEADCHNL RV PACING THRESHOLD AMPLITUDE: 1 V
MDC IDC MSMT LEADCHNL RV PACING THRESHOLD PULSEWIDTH: 0.4 ms
MDC IDC MSMT LEADCHNL RV SENSING INTR AMPL: 15.8 mV
MDC IDC PG IMPLANT DT: 20110706
MDC IDC SET LEADCHNL RA PACING AMPLITUDE: 2 V
MDC IDC SET LEADCHNL RV SENSING SENSITIVITY: 0.5 mV
MDC IDC STAT BRADY RA PERCENT PACED: 1 % — AB
Pulse Gen Serial Number: 168307

## 2017-11-21 MED ORDER — FUROSEMIDE 40 MG PO TABS: 120.0000 mg | ORAL_TABLET | Freq: Every day | ORAL | 3 refills | Status: DC

## 2017-11-21 NOTE — Telephone Encounter (Signed)
New message    Pt c/o swelling: STAT is pt has developed SOB within 24 hours  1) How much weight have you gained and in what time span?don't know   2) If swelling, where is the swelling located? Knees to feet   3) Are you currently taking a fluid pill? Yes   4) Are you currently SOB? No   5) Do you have a log of your daily weights (if so, list)? Yes, but patient has not used the scale   6) Have you gained 3 pounds in a day or 5 pounds in a week? Don't know   7) Have you traveled recently? No   Please contact the patient to follow up.

## 2017-11-21 NOTE — Telephone Encounter (Signed)
Returned call to Pt.  Per Dr. Lovena Le: Advised Pt to eliminate all salt from diet. Advised Pt to increase lasix to 120 mg daily.  When swelling goes down Pt may return to 80 mg lasix daily.  Pt indicates understanding.  Advised Pt to call this nurse next week if no improvement in edema.

## 2017-11-23 MED ORDER — FUROSEMIDE 40 MG PO TABS: 120.0000 mg | ORAL_TABLET | Freq: Every day | ORAL | 3 refills | Status: AC

## 2017-11-23 NOTE — Telephone Encounter (Signed)
Please advise on how this should be ordered. See 11/21/17 phone note below  Damian Leavell, RN   11/21/17 5:41 PM  Note    Returned call to Pt.  Per Dr. Lovena Le: Advised Pt to eliminate all salt from diet. Advised Pt to increase lasix to 120 mg daily.  When swelling goes down Pt may return to 80 mg lasix daily.  Pt indicates understanding.  Advised Pt to call this nurse next week if no improvement in edema      Wasn't sure if sig should be changed. Thanks, MI

## 2017-11-30 DIAGNOSIS — Z794 Long term (current) use of insulin: Secondary | ICD-10-CM | POA: Diagnosis not present

## 2017-11-30 DIAGNOSIS — E1165 Type 2 diabetes mellitus with hyperglycemia: Secondary | ICD-10-CM | POA: Diagnosis not present

## 2017-11-30 DIAGNOSIS — E113413 Type 2 diabetes mellitus with severe nonproliferative diabetic retinopathy with macular edema, bilateral: Secondary | ICD-10-CM | POA: Diagnosis not present

## 2017-12-06 DIAGNOSIS — Z950 Presence of cardiac pacemaker: Secondary | ICD-10-CM | POA: Diagnosis not present

## 2017-12-06 DIAGNOSIS — N184 Chronic kidney disease, stage 4 (severe): Secondary | ICD-10-CM | POA: Diagnosis not present

## 2017-12-06 DIAGNOSIS — N186 End stage renal disease: Secondary | ICD-10-CM | POA: Diagnosis not present

## 2017-12-06 DIAGNOSIS — Z794 Long term (current) use of insulin: Secondary | ICD-10-CM | POA: Diagnosis not present

## 2017-12-06 DIAGNOSIS — M5116 Intervertebral disc disorders with radiculopathy, lumbar region: Secondary | ICD-10-CM | POA: Diagnosis not present

## 2017-12-06 DIAGNOSIS — M47816 Spondylosis without myelopathy or radiculopathy, lumbar region: Secondary | ICD-10-CM | POA: Diagnosis not present

## 2017-12-06 DIAGNOSIS — E1122 Type 2 diabetes mellitus with diabetic chronic kidney disease: Secondary | ICD-10-CM | POA: Diagnosis not present

## 2017-12-06 DIAGNOSIS — M4726 Other spondylosis with radiculopathy, lumbar region: Secondary | ICD-10-CM | POA: Diagnosis not present

## 2017-12-06 DIAGNOSIS — I132 Hypertensive heart and chronic kidney disease with heart failure and with stage 5 chronic kidney disease, or end stage renal disease: Secondary | ICD-10-CM | POA: Diagnosis not present

## 2017-12-06 DIAGNOSIS — M5136 Other intervertebral disc degeneration, lumbar region: Secondary | ICD-10-CM | POA: Diagnosis not present

## 2017-12-06 DIAGNOSIS — M8588 Other specified disorders of bone density and structure, other site: Secondary | ICD-10-CM | POA: Diagnosis not present

## 2017-12-10 ENCOUNTER — Ambulatory Visit (INDEPENDENT_AMBULATORY_CARE_PROVIDER_SITE_OTHER): Payer: Medicare Other | Admitting: *Deleted

## 2017-12-10 DIAGNOSIS — I255 Ischemic cardiomyopathy: Secondary | ICD-10-CM

## 2017-12-10 DIAGNOSIS — M545 Low back pain: Secondary | ICD-10-CM | POA: Diagnosis not present

## 2017-12-10 DIAGNOSIS — M6281 Muscle weakness (generalized): Secondary | ICD-10-CM | POA: Diagnosis not present

## 2017-12-10 DIAGNOSIS — G8929 Other chronic pain: Secondary | ICD-10-CM | POA: Diagnosis not present

## 2017-12-10 DIAGNOSIS — R2689 Other abnormalities of gait and mobility: Secondary | ICD-10-CM | POA: Diagnosis not present

## 2017-12-10 NOTE — Progress Notes (Signed)
Remote ICD transmission.   

## 2017-12-15 ENCOUNTER — Ambulatory Visit (HOSPITAL_BASED_OUTPATIENT_CLINIC_OR_DEPARTMENT_OTHER): Payer: Medicare Other | Attending: Physician Assistant | Admitting: Cardiology

## 2017-12-15 VITALS — Ht 69.0 in | Wt 266.0 lb

## 2017-12-15 DIAGNOSIS — R0681 Apnea, not elsewhere classified: Secondary | ICD-10-CM

## 2017-12-15 DIAGNOSIS — G4733 Obstructive sleep apnea (adult) (pediatric): Secondary | ICD-10-CM | POA: Diagnosis not present

## 2017-12-15 DIAGNOSIS — G4736 Sleep related hypoventilation in conditions classified elsewhere: Secondary | ICD-10-CM | POA: Diagnosis not present

## 2017-12-15 DIAGNOSIS — G4730 Sleep apnea, unspecified: Secondary | ICD-10-CM | POA: Diagnosis present

## 2017-12-19 NOTE — Procedures (Signed)
   Patient Name: Matthew [name], Matthew [name] Date: 12/15/2017 Gender: Male D.O.B: 1965/10/11 Age (years): 51 Referring Provider: Brien Few Charlcie Cradle PA-C Height (inches): 83 Interpreting Physician: Fransico Him MD, ABSM Weight (lbs): 266 RPSGT: Lanae Boast BMI: 39 MRN: [id number] Neck Size: 18.00  CLINICAL INFORMATION  Sleep Study Type: Split Night CPAP  Indication for sleep study: N/A  Epworth Sleepiness Score: 12  SLEEP STUDY TECHNIQUE  As per the AASM Manual for the Scoring of Sleep and Associated Events v2.3 (April 2016) with a hypopnea requiring 4% desaturations. The channels recorded and monitored were frontal, central and occipital EEG, electrooculogram (EOG), submentalis EMG (chin), nasal and oral airflow, thoracic and abdominal wall motion, anterior tibialis EMG, snore microphone, electrocardiogram, and pulse oximetry. Continuous positive airway pressure (CPAP) was initiated when the patient met split night criteria and was titrated according to treat sleep-disordered breathing.  MEDICATIONS  Medications self-administered by patient taken the night of the study : N/A  RESPIRATORY PARAMETERS  Diagnostic Total AHI (/hr):62.1 RDI (/hr):64.0 OA Index (/hr):0.9 CA Index (/hr):45.3 REM AHI (/hr):N/A NREM AHI (/hr):62.1 Supine AHI (/hr):67.0 Non-supine AHI (/hr):48.2 Min O2 Sat (%):76.0 Mean O2 (%):92.8 Time below 88% (min):21  Titration Optimal Pressure (cm):N/A AHI at Optimal Pressure (/hr):N/A Min O2 at Optimal Pressure (%):66.0 Supine % at Optimal (%):N/A Sleep % at Optimal (%):N/A  SLEEP ARCHITECTURE  The recording time for the entire night was 393.7 minutes. During a baseline period of 199.9 minutes, the patient slept for 128.5 minutes in REM and nonREM, yielding a sleep efficiency of 64.3%%. Sleep onset after lights out was 0.8 minutes with a REM latency of N/A minutes. The patient spent 19.8%% of the night in stage N1 sleep, 80.2%% in stage N2 sleep, 0.0%%  in stage N3 and 0% in REM.  During the titration period of 185.7 minutes, the patient slept for 161.9 minutes in REM and nonREM, yielding a sleep efficiency of 87.1%%. Sleep onset after CPAP initiation was 0.9 minutes with a REM latency of 102.0 minutes. The patient spent 12.0%% of the night in stage N1 sleep, 54.6%% in stage N2 sleep, 0.0%% in stage N3 and 33.4% in REM.  CARDIAC DATA  The 2 lead EKG demonstrated pacemaker generated. The mean heart rate was 100.0 beats per minute. Other EKG findings include: PVCs.  LEG MOVEMENT DATA  The total Periodic Limb Movements of Sleep (PLMS) were 0. The PLMS index was 0.0 .  IMPRESSIONS  - Severe obstructive sleep apnea occurred during the diagnostic portion of the study (AHI = 62.1/hour). An optimal PAP pressure could not be selected for this patient based on the available study data. - Severe central sleep apnea occurred during the diagnostic portion of the study (CAI = (CAI = 45.3/hour). - Severe oxygen desaturation was noted during the diagnostic portion of the study (Min O2 = 76.0%). - No snoring was audible during the diagnostic portion of the study. - EKG findings include PVCs. - Clinically significant periodic limb movements did not occur during sleep.  DIAGNOSIS  - Obstructive Sleep Apnea (327.23 [G47.33 ICD-10]) - Nocturnal Hypoxemia (327.26 [G47.36 ICD-10])  RECOMMENDATIONS  - Recommend BiPAP titration given sub-optimal CPAP titration. - Avoid alcohol, sedatives and other CNS depressants that may worsen sleep apnea and disrupt normal sleep architecture. - Sleep hygiene should be reviewed to assess factors that may improve sleep quality. - Weight management and regular exercise should be initiated or continued.  [Electronically signed] 12/19/2017 09:59 PM  Fransico Him MD, ABSM Diplomate, American Board of Sleep Medicine

## 2017-12-20 ENCOUNTER — Ambulatory Visit: Payer: Medicare Other | Admitting: Family Medicine

## 2017-12-20 VITALS — BP 132/64 | HR 64 | Resp 12 | Ht 69.0 in | Wt 270.0 lb

## 2017-12-20 DIAGNOSIS — Z23 Encounter for immunization: Secondary | ICD-10-CM

## 2017-12-20 DIAGNOSIS — M5410 Radiculopathy, site unspecified: Secondary | ICD-10-CM

## 2017-12-20 DIAGNOSIS — E1122 Type 2 diabetes mellitus with diabetic chronic kidney disease: Secondary | ICD-10-CM

## 2017-12-20 DIAGNOSIS — E1169 Type 2 diabetes mellitus with other specified complication: Secondary | ICD-10-CM

## 2017-12-20 DIAGNOSIS — F17200 Nicotine dependence, unspecified, uncomplicated: Secondary | ICD-10-CM

## 2017-12-20 DIAGNOSIS — IMO0002 Reserved for concepts with insufficient information to code with codable children: Secondary | ICD-10-CM

## 2017-12-20 DIAGNOSIS — Z794 Long term (current) use of insulin: Secondary | ICD-10-CM

## 2017-12-20 DIAGNOSIS — E1165 Type 2 diabetes mellitus with hyperglycemia: Secondary | ICD-10-CM

## 2017-12-20 DIAGNOSIS — N184 Chronic kidney disease, stage 4 (severe): Secondary | ICD-10-CM

## 2017-12-20 DIAGNOSIS — N5201 Erectile dysfunction due to arterial insufficiency: Secondary | ICD-10-CM

## 2017-12-20 DIAGNOSIS — E785 Hyperlipidemia, unspecified: Secondary | ICD-10-CM

## 2017-12-20 DIAGNOSIS — I10 Essential (primary) hypertension: Secondary | ICD-10-CM

## 2017-12-20 DIAGNOSIS — F17220 Nicotine dependence, chewing tobacco, uncomplicated: Secondary | ICD-10-CM

## 2017-12-20 MED ORDER — PREDNISONE 10 MG PO TABS: 10.0000 mg | ORAL_TABLET | Freq: Two times a day (BID) | ORAL | 0 refills | Status: DC

## 2017-12-20 MED ORDER — METHYLPREDNISOLONE ACETATE 80 MG/ML IJ SUSP
80.0000 mg | Freq: Once | INTRAMUSCULAR | Status: AC
Start: 2017-12-20 — End: 2017-12-20
  Administered 2017-12-20: 80 mg via INTRAMUSCULAR

## 2017-12-20 MED ORDER — CYCLOBENZAPRINE HCL 10 MG PO TABS: ORAL_TABLET | ORAL | 0 refills | Status: AC

## 2017-12-20 NOTE — Assessment & Plan Note (Addendum)
Uncontrolled, depomedrol in  office followed by short course of prednisone and a muscle relaxant

## 2017-12-20 NOTE — Patient Instructions (Addendum)
Wellness with nurse in 2020, January, pu Dad on same day please  Physical exam with MD in 5 months, call if you need me before, Dad f/u on same day  Please reschedule urology appt ( pt)  Depo medrol administered for back pain and short course of prednisone and a bedtime muscle relaxant are prescribed   We are referring you and Dad to [facility] social work  Fasting lipid cmp and EGFR  In next week [place] [facility]   Best  With kidney and diabetes  It is important that you exercise regularly at least 30 minutes 5 times a week. If you develop chest pain, have severe difficulty breathing, or feel very tired, stop exercising immediately and seek medical attention   Thanks for choosing [facility], we consider it a privelige to serve you.

## 2017-12-20 NOTE — Progress Notes (Signed)
Matthew [name]     MRN: [id number]      DOB: 01-15-1966   HPI Matthew [name] is here for follow up and re-evaluation of chronic medical conditions, medication management and review of any available recent lab and radiology data.  Preventive health is updated, specifically  Cancer screening and Immunization.   Questions or concerns regarding consultations or procedures which the PT has had in the interim are  addressed. The PT denies any adverse reactions to current medications since the last visit.  Seeing endo and nephrology , also seeing back specialist, pT, in place , feels stiff, requests injection and acute pain management for back pain and stiffness states debilitating and limits ambulation. Reports improved blood sugar, due to renal function NSAID not an option  ROS Denies recent fever or chills. Denies sinus pressure, nasal congestion, ear pain or sore throat. Denies chest congestion, productive cough or wheezing. Denies chest pains, palpitations and leg swelling Denies abdominal pain, nausea, vomiting,diarrhea or constipation.   Denies dysuria, frequency, hesitancy or incontinence.  Denies headaches, seizures, numbness in feet is chronic  Denies uncontrolled depression, anxiety or insomnia.Treated by Psychiatry Denies skin break down or rash.   PE  BP 132/64   Pulse 64   Resp 12   Ht 5\' 9"  (1.753 m)   Wt 270 lb (122.5 kg)   SpO2 99%   BMI 39.87 kg/m   Patient alert and oriented and in no cardiopulmonary distress.  HEENT: No facial asymmetry, EOMI,   oropharynx pink and moist.  Neck supple no JVD, no mass.  Chest: Clear to auscultation bilaterally.  CVS: S1, S2 no murmurs, no S3.Regular rate.  ABD: Soft non tender.   Ext: No edema  OE:HOZYYQMG  ROM spine, shoulders, hips and knees.  Skin: Intact, no ulcerations or rash noted.  Psych: Good eye contact, normal affect. Memory intact not anxious or depressed appearing.  CNS: CN 2-12 intact, power,  normal  throughout.no focal deficits noted.   Assessment & Plan  Back pain with radiculopathy Uncontrolled, depomedrol in  office followed by short course of prednisone and a muscle relaxant  CKD stage 4 due to type 2 diabetes mellitus ([place]) Followed by Nephrology at [place] obesity [facility]) Deteriorated. Patient re-educated about  the importance of commitment to a  minimum of 150 minutes of exercise per week.  The importance of healthy food choices with portion control discussed. Encouraged to start a food diary, count calories and to consider  joining a support group. Sample diet sheets offered. Goals set by the patient for the next several months.   Weight /BMI 12/20/2017 12/15/2017 11/09/2017  WEIGHT 270 lb 266 lb 272 lb  HEIGHT 5\' 9"  5\' 9"  5\' 9"   BMI 39.87 kg/m2 39.28 kg/m2 40.17 kg/m2  Some encounter information is confidential and restricted. Go to Review Flowsheets activity to see all data.      NICOTINE ADDICTION Asked: confirms current use of snuff, states less than in the past Assess:unwlling to committing to quitting Advise: needs to quit to reduce recurrent heart disease and cerebrovascular disease / stroke also all types of cancer Assist : counseled for 5 minutes re need to quit  Arrange: f/u in 3 to 4 months  Uncontrolled type 2 diabetes mellitus with stage 4 chronic kidney disease, with long-term current use of insulin ([place]) Deteriorated , managed by endo and also seeing nephrology due to progressive CKD Matthew [name] is reminded of the importance of commitment to daily physical activity for  30 minutes or more, as able and the need to limit carbohydrate intake to 30 to 60 grams per meal to help with blood sugar control.   The need to take medication as prescribed, test blood sugar as directed, and to call between visits if there is a concern that blood sugar is uncontrolled is also discussed.   Matthew [name] is reminded of the importance of daily foot exam, annual eye  examination, and good blood sugar, blood pressure and cholesterol control.  Diabetic Labs Latest Ref Rng & Units 08/15/2017 07/12/2017 07/11/2017 07/10/2017 07/10/2017  HbA1c 4.8 - 5.6 % - - - - -  Microalbumin Not Estab. ug/mL - - - - -  Micro/Creat Ratio 0.0 - 30.0 mg/g creat - - - - -  Chol <200 mg/dL - - - - -  HDL >40 mg/dL - - - - -  Calc LDL mg/dL (calc) - - - - -  Triglycerides <150 mg/dL - - - - -  Creatinine 0.70 - 1.33 mg/dL 3.12(H) 4.54(H) 4.39(H) 4.37(H) 4.41(H)  GFR >60.00 mL/min - - - - -   BP/Weight 12/20/2017 12/15/2017 11/09/2017 08/15/2017 07/12/2017 07/03/2017 3/32/9518  Systolic BP 841 - 660 630 160 109 -  Diastolic BP 64 - 67 76 61 68 -  Wt. (Lbs) 270 266 272 257 - - 255  BMI 39.87 39.28 40.17 37.95 - - 37.66  Some encounter information is confidential and restricted. Go to Review Flowsheets activity to see all data.   Foot/eye exam completion dates Latest Ref Rng & Units 12/20/2017 11/16/2016  Eye Exam No Retinopathy - -  Foot exam Order - - -  Foot Form Completion - Done Done

## 2017-12-23 ENCOUNTER — Encounter: Payer: Self-pay | Admitting: Family Medicine

## 2017-12-23 ENCOUNTER — Telehealth: Payer: Self-pay | Admitting: Family Medicine

## 2017-12-23 NOTE — Assessment & Plan Note (Signed)
Deteriorated , managed by endo and also seeing nephrology due to progressive CKD Matthew [name] is reminded of the importance of commitment to daily physical activity for 30 minutes or more, as able and the need to limit carbohydrate intake to 30 to 60 grams per meal to help with blood sugar control.   The need to take medication as prescribed, test blood sugar as directed, and to call between visits if there is a concern that blood sugar is uncontrolled is also discussed.   Matthew [name] is reminded of the importance of daily foot exam, annual eye examination, and good blood sugar, blood pressure and cholesterol control.  Diabetic Labs Latest Ref Rng & Units 08/15/2017 07/12/2017 07/11/2017 07/10/2017 07/10/2017  HbA1c 4.8 - 5.6 % - - - - -  Microalbumin Not Estab. ug/mL - - - - -  Micro/Creat Ratio 0.0 - 30.0 mg/g creat - - - - -  Chol <200 mg/dL - - - - -  HDL >40 mg/dL - - - - -  Calc LDL mg/dL (calc) - - - - -  Triglycerides <150 mg/dL - - - - -  Creatinine 0.70 - 1.33 mg/dL 3.12(H) 4.54(H) 4.39(H) 4.37(H) 4.41(H)  GFR >60.00 mL/min - - - - -   BP/Weight 12/20/2017 12/15/2017 11/09/2017 08/15/2017 07/12/2017 07/03/2017 11/20/9148  Systolic BP 569 - 794 801 655 374 -  Diastolic BP 64 - 67 76 61 68 -  Wt. (Lbs) 270 266 272 257 - - 255  BMI 39.87 39.28 40.17 37.95 - - 37.66  Some encounter information is confidential and restricted. Go to Review Flowsheets activity to see all data.   Foot/eye exam completion dates Latest Ref Rng & Units 12/20/2017 11/16/2016  Eye Exam No Retinopathy - -  Foot exam Order - - -  Foot Form Completion - Done Done

## 2017-12-23 NOTE — Assessment & Plan Note (Signed)
Deteriorated. Patient re-educated about  the importance of commitment to a  minimum of 150 minutes of exercise per week.  The importance of healthy food choices with portion control discussed. Encouraged to start a food diary, count calories and to consider  joining a support group. Sample diet sheets offered. Goals set by the patient for the next several months.   Weight /BMI 12/20/2017 12/15/2017 11/09/2017  WEIGHT 270 lb 266 lb 272 lb  HEIGHT 5\' 9"  5\' 9"  5\' 9"   BMI 39.87 kg/m2 39.28 kg/m2 40.17 kg/m2  Some encounter information is confidential and restricted. Go to Review Flowsheets activity to see all data.

## 2017-12-23 NOTE — Addendum Note (Signed)
Addended by: Tula Nakayama E on: 12/23/2017 05:30 PM   Modules accepted: Orders

## 2017-12-23 NOTE — Assessment & Plan Note (Signed)
Asked: confirms current use of snuff, states less than in the past Assess:unwlling to committing to quitting Advise: needs to quit to reduce recurrent heart disease and cerebrovascular disease / stroke also all types of cancer Assist : counseled for 5 minutes re need to quit  Arrange: f/u in 3 to 4 months

## 2017-12-23 NOTE — Assessment & Plan Note (Signed)
Followed by Nephrology at [facility]

## 2017-12-23 NOTE — Telephone Encounter (Signed)
We need to try to see if we can establish some contact with social worker for father and son. Both have significant medical challenges, father severe dementia, son with uncontrolled diabetes, heart disease, vision impairment  , during son's hospitalization earlier this year, father was left at home, no support. Getting a SW  To assess the situation and see what support system can be utilized by them when needed pls let me know what you are able to find out and get done so we continue to work on this Son states has had no call/ interaction from [facility] Thanks

## 2017-12-23 NOTE — Assessment & Plan Note (Signed)
Remains symptomatic, missed appt ear;lier this year due to ill health, will refer , and he will call for the appt with Dr Jeffie Pollock, last seen in 11/2018with 6 month f/u planned

## 2017-12-24 ENCOUNTER — Telehealth: Payer: Self-pay | Admitting: *Deleted

## 2017-12-24 DIAGNOSIS — G4733 Obstructive sleep apnea (adult) (pediatric): Secondary | ICD-10-CM

## 2017-12-24 LAB — CUP PACEART REMOTE DEVICE CHECK
Date Time Interrogation Session: 20191021163936
Implantable Lead Implant Date: 20110706
Implantable Lead Location: 753859
Implantable Lead Location: 753860
Implantable Lead Model: 184
Implantable Lead Model: 5076
Implantable Lead Serial Number: 310740
Implantable Pulse Generator Implant Date: 20110706
MDC IDC LEAD IMPLANT DT: 20110706
MDC IDC PG SERIAL: 168307

## 2017-12-24 NOTE — Telephone Encounter (Signed)
Informed patient of sleep study results and patient understanding was verbalized. Patient understands his titration study showed he have sleep apnea but had unsuccessful CPAP titration and recommend BiPAP titration.  Pt is aware and agreeable to these results.  Bipap titration sent to pre cert

## 2017-12-24 NOTE — Telephone Encounter (Signed)
-----   Message from Sueanne Margarita, MD sent at 12/19/2017 10:03 PM EDT ----- Please let patient know that they have sleep apnea but had unsuccessful CPAP titration and recommend BiPAP titration. Please set up titration in the sleep lab.

## 2017-12-25 ENCOUNTER — Telehealth: Payer: Self-pay | Admitting: *Deleted

## 2017-12-25 NOTE — Telephone Encounter (Signed)
Staff message sent to Gae Bon per [facility] web portal no PA is required for BIPAP titration. Ok to schedule. Decision [id number].

## 2017-12-25 NOTE — Telephone Encounter (Signed)
-----   Message from Freada Bergeron, [place] sent at 12/24/2017  5:55 PM EDT ----- Regarding: pre cert Bipap titration

## 2017-12-26 NOTE — Telephone Encounter (Signed)
Patient is scheduled for Bipap study on 02/13/18. Patient understands hhis sleep study will be done at [facility] sleep lab. Patient understands he will receive a sleep packet in a week or so. Patient understands to call if he does not receive the sleep packet in a timely manner.  Left detailed message on voicemail with date and time of titration and informed patient to call back to confirm or reschedule.

## 2017-12-26 NOTE — Telephone Encounter (Signed)
  Lauralee Evener, CMA  Freada Bergeron, CMA        Per [facility] web portal no PA is required. Ok to schedule BIPAP titration. Decision [id number]      ----- Message -----  From: Freada Bergeron, CMA  Sent: 12/24/2017  5:55 PM EDT  To: Cv Div Sleep Studies  Subject: pre cert                     Bipap titration

## 2017-12-26 NOTE — Addendum Note (Signed)
Addended by: Freada Bergeron on: 12/26/2017 08:52 AM   Modules accepted: Orders

## 2017-12-28 ENCOUNTER — Telehealth: Payer: Self-pay

## 2017-12-28 ENCOUNTER — Other Ambulatory Visit: Payer: Self-pay | Admitting: *Deleted

## 2017-12-28 DIAGNOSIS — Z794 Long term (current) use of insulin: Principal | ICD-10-CM

## 2017-12-28 DIAGNOSIS — E1165 Type 2 diabetes mellitus with hyperglycemia: Principal | ICD-10-CM

## 2017-12-28 DIAGNOSIS — IMO0001 Reserved for inherently not codable concepts without codable children: Secondary | ICD-10-CM

## 2017-12-28 NOTE — Patient Outreach (Signed)
[place] [facility]) Care Management  12/28/2017  Matthew [name] 1966-02-15 [id number]   Telephone Screen  Referral Date: 12/28/17 Referral Source: [facility] Referral Reason: Urgent need for social worker consult  Diagnoses of Diabetes Note Text:  Son and Father both need [facility] social worker eval. They live together and both have chronic health problems and the son was hospitalized a couple months ago and his father was left alone with no help   Insurance: united health care medicare   Admissions x 2 in last 6 months  Outreach attempt # 1 No answer. [facility] RN CM left HIPAA compliant voicemail message along with CM's contact info.   Plan: [facility] RN CM sent an unsuccessful outreach letter and scheduled this patient for another call attempt within 4 business days   Khyla Mccumbers L. Lavina Hamman, RN, BSN, [place] Coordinator Office number [phone] Mobile number [phone]  Main [facility] number [phone] Fax number [phone]

## 2017-12-28 NOTE — Telephone Encounter (Signed)
Referred both urgently to [facility] for social work evaluation

## 2017-12-31 NOTE — Telephone Encounter (Signed)
REFERRAL ENTERED FOR SOCIAL WORKER CONSULT FOR [facility]

## 2018-01-01 ENCOUNTER — Telehealth: Payer: Self-pay | Admitting: Pharmacist

## 2018-01-01 ENCOUNTER — Other Ambulatory Visit: Payer: Self-pay | Admitting: *Deleted

## 2018-01-01 NOTE — Patient Outreach (Signed)
[facility]) Care Management  01/01/2018  Naksh Radi 1965/12/22 [id number]   Telephone Screen  Referral Date: 12/28/17 Referral Source: [facility]erral Reason: Urgent need for social worker consult  Diagnoses of Diabetes Note Text:  Son and Father both need [facility] social worker eval. They live together and both have chronic health problems and the son was hospitalized a couple months ago and his father was left alone with no help   Insurance: united health care medicare   Admissions x 2 in last 6 months  Outreach attempt # 2 successful  Patient is able to verify HIPAA Reviewed and addressed referral to [facility] with patient Mr Garringer confirms he is needing assistance for himself He states he is presently receiving outpatient PT for his back.  He also discussed having a recent sleep study and inquired about [place] health care medicare assisting with him getting DME after the sleep study    Social: Mr Cancio is a divorced christian Native American who lives with his father and is the primary care giver for the father. He does not have support for himself at this time except some from a sister at times.  He voices that he is independent in his ADLs, iADL and transportation.  He does voice that he has visual concerns.    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 Patient gave verbal consent for services for [facility] SW, Pharmacy and [place] RN CM.    Plan: Plan [facility] RN CM will refer Mr Favor to [facility] SW (advance directives, financial resources,  depression with anxiety s/s- on medication, community resources and support services)  [facility] RN CM will refer Mr Enderle to [facility] RN (falls, disease management, vision deficits, DME needs for OSA,primary care giver to his father )  [facility] RN CM will refer Mr Cominsky to [place] for polypharmacy and donut hole/coverage gap cost concerns with Ulyses Jarred L. Lavina Hamman, RN, BSN, [place] Coordinator Office number [phone] Mobile number [phone]  Main [facility] number [phone] Fax number [phone]

## 2018-01-03 ENCOUNTER — Other Ambulatory Visit: Payer: Self-pay | Admitting: *Deleted

## 2018-01-03 NOTE — Patient Outreach (Addendum)
[facility]) Care Management  [place]   01/04/2018  Shuayb Schepers 10-Feb-1966 [id number]  Reason for referral: Tresiba   Referral source: Telephonic Nurse-Kim Gibbs Referral medication(s): Tyler Aas Current insurance:[employer] Care  HPI:  Allergic rhinitis, anxiety, CKD stage IV, Depression, anxiety, type 2 diabetes, hypertension, insomnia, morbid obesity, nicotine addiction, OSA, and  RLS.   Objective: Allergies  Allergen Reactions  . Ace Inhibitors Cough  . Cephalexin Other (See Comments)    Resistant to antibiotic  . Beta Adrenergic Blockers Cough    Medications Reviewed Today    Reviewed by Elayne Guerin, [facility] (Pharmacist) on 01/01/18 at [place] List Status: <None>  Medication Order Taking? Sig Documenting Provider Last Dose Status Informant  acetaminophen (TYLENOL) 500 MG tablet [id number] Yes Take 1,000 mg by mouth every 8 (eight) hours as needed for mild pain.  [provider] Taking Active Self  aspirin EC 81 MG tablet [id number] Yes Take 81 mg by mouth daily. [provider] Taking Active   baclofen (LIORESAL) 20 MG tablet [id number] Yes TAKE 1 TABLET BY MOUTH 3  TIMES DAILY Kathrynn Ducking, MD Taking Active   busPIRone (BUSPAR) 10 MG tablet [id number] Yes Take 1 tablet (10 mg total) by mouth 2 (two) times daily. Cloria Spring, MD Taking Active   cyclobenzaprine (FLEXERIL) 10 MG tablet [id number] Yes One tablet at bedtime, as needed, for back spasm [name], MD Taking Active   ergocalciferol (VITAMIN D2) 50000 units capsule [id number] Yes Take 1 capsule (50,000 Units total) by mouth once a week. One capsule once weekly [name], MD Taking Active   FLUoxetine (PROZAC) 20 MG capsule [id number] Yes Take 1 capsule (20 mg total) by mouth daily. Cloria Spring, MD Taking Active   fluticasone [facility]) 50 MCG/ACT nasal spray [id number] Yes Place 2 sprays into both nostrils daily. [name], MD Taking  Active Self  furosemide (LASIX) 40 MG tablet [id number] Yes Take 3 tablets (120 mg total) by mouth daily.  Patient taking differently:  Take 120 mg by mouth daily. Takes 1 tablet twice daily --increases to 3 tablets daily with edema   Evans Lance, MD Taking Active   lactobacillus acidophilus & bulgar (LACTINEX) chewable tablet [id number] Yes Chew 1 tablet by mouth 3 (three) times daily with meals. Regalado, Belkys A, MD Taking Active   Melatonin 10 MG TABS [id number] Yes Take 10 mg by mouth at bedtime. [provider] Taking Active Self  methylcellulose (ARTIFICIAL TEARS) 1 % ophthalmic solution [id number] Yes Place 1 drop into both eyes 2 (two) times daily as needed (dry eyes). [provider] Taking Active Self  metoprolol succinate (TOPROL-XL) 50 MG 24 hr tablet [id number] Yes Take 1 tablet (50 mg total) by mouth daily. Baldwin Jamaica, PA-C Taking Active   mirtazapine (REMERON) 30 MG tablet [id number] Yes Take 1 tablet (30 mg total) by mouth at bedtime. Cloria Spring, MD Taking Active   Multiple Vitamin (MULTIVITAMIN) tablet [id number] Yes Take 1 tablet by mouth daily.   [provider] Taking Active Self  nitroGLYCERIN (NITROSTAT) 0.4 MG SL tablet [id number] Yes Place 1 tablet (0.4 mg total) under the tongue every 5 (five) minutes as needed for chest pain. Evans Lance, MD Taking Active Self  Omega-3 Fatty Acids (FISH OIL BURP-LESS PO) [id number] Yes Take 1 tablet by mouth daily.   [provider] Taking Active Self  pramipexole (MIRAPEX) 0.125 MG tablet [id number] No Take 1 tablet (0.125  mg total) by mouth at bedtime. Kathrynn Ducking, MD Unknown Active Self  predniSONE (DELTASONE) 10 MG tablet [id number] Yes Take 1 tablet (10 mg total) by mouth 2 (two) times daily with a meal. [name], MD Taking Active   Propylhexedrine [facility]) INHA [id number] Yes Place 1 each into the nose 2 (two) times daily as needed (congestion).  [provider]  Taking Active Self  rosuvastatin (CRESTOR) 40 MG tablet [id number] Yes Take 1 tablet (40 mg total) by mouth daily. Baldwin Jamaica, PA-C Taking Active   vitamin C (VITAMIN C) 1000 MG tablet [id number] Yes Take 1 tablet (1,000 mg total) by mouth daily. Roxan Hockey, MD Taking Active Self          Assessment:  Drugs sorted by system:  Neurologic/Psychologic: Buspirone, Fluoxetine, Pramipexole, Mirtazapine,   Cardiovascular: Aspirin, Furosemide, Metoprolol, Nitroglycerin, Omega 3 Fatty Acid, metoprolol Succinate, Omega 3 Fatty Acid, Rosuvastatin,   Pulmonary/Allergy: Fluticasone Nasal Spray, Propylhexedrine (Benzedrex)   Gastrointestinal: Lactinex,   Endocrine: Tyler Aas (Not taking due to cost), Relion Novolin 70/30,  HgA1c 9.4% up from 7.5% in May  Pain: Acetaminophen,  Baclofen, Cyclobenzaprine, Melatonin,   Vitamins/Minerals/Supplements: Ergocalciferol, Vitamin C, Multivitamin,   Miscellaneous: Artificial Tears,   Medication Review Findings:  . Therapeutic duplication-cyclobenzaprine/Baclofen . Adherence-Ezetimibe-not taking due to cost,   Medication Assistance Findings:  Extra Help:   '[]'  Already receiving Full Extra Help  '[]'  Already receiving Partial Extra Help  '[x]'  Eligible based on reported income and assets-EXTRA HELP APPLICATION COMPLETED ON THE PATIENT'S BEHALF  '[]'  Not Eligible based on reported income and assets  Patient Assistance Programs: 1) Victoza, [place] made by [place] requirement met: '[x]'  Yes '[]'  No '[]'  Unknown o Out-of-pocket prescription expenditure met:    '[]'  Yes '[x]'  No  '[]'  Unknown  '[]'  Not applicable - Will send the provider a message about switching to [employer] and [employer]         2)  Zetia made by [employer] o Income requirement met: '[x]'  Yes '[]'  No  '[]'  Unknown o Out-of-pocket prescription expenditure met:   '[]'  Yes '[]'  No   '[]'  Unknown '[x]'  Not applicable   Plan: I will route patient assistance letter to [place] technician  who will coordinate patient assistance program application process for medications listed above.  [facility] pharmacy technician will assist with obtaining all required documents from both patient and provider(s) and submit application(s) once completed.    Route note to patient's providers to alert them about the upcoming applications that will be sent to their offices and to alert about the Therapeutic Duplications.    Ask Dr. Ashley Murrain about a temporary switch to [place] and [place].  Follow up on applications with patient in 2 weeks.  Elayne Guerin, PharmD, [place] Clinical Pharmacist [phone]

## 2018-01-03 NOTE — Patient Outreach (Addendum)
[place] [facility]) Care Management  01/03/2018  Maceo Hernan Apr 24, 1965 [id number]   RN spoke with pt and introduced [facility] services and purpose for today's call. Pt confirms his ongoing medical problems however indicate he is managing his HTN well with no needs. RN inquired on safety in home due to fall history. Pt states due to his ongoing neuropathy and foot droop he's "gonna fall" regardless of the prevention measures. RN inquired on DME as pt confirms he uses a can and rolling walker. Pt states he needs a shower chair was the only DME requested however pt's reports his income at $1650 month and indicates this is something he can afford. Pt also indicates he is pending a coverage for his CPAP from a recent sleep study performed.   RN offered a home visit to further evaluate safety in his home environment. Pt opt to decline indicating he has safety bars throughout his home to stay safe. RN offered consult for social worker on any needed community resources (declined). Pt states he is in the donut whole. Pt aware notes indicated Denyse Amass is in contact with pt to assist if possible. No other inquires or request at this time. RN provided a contact name and number for any future needs or resources for [facility] involvement. Pt very appreciative and grateful for the call today. Pt aware his provider will be notify of today's contact and his decline for services at this time. Case will be closed via this RN case Freight forwarder.  Raina Mina, RN Care Management Coordinator [place] Office [phone]

## 2018-01-04 ENCOUNTER — Encounter: Payer: Self-pay | Admitting: *Deleted

## 2018-01-04 ENCOUNTER — Other Ambulatory Visit: Payer: Self-pay | Admitting: *Deleted

## 2018-01-04 NOTE — Patient Outreach (Signed)
[place] [facility]) Care Management  01/04/2018  Matthew [name] 05/08/1965 [id number]   CSW was able to make brief initial contact with patient today to perform phone assessment, as well as assess and assist with social work needs and services.  CSW introduced self, explained role and types of services provided through [place] [facility] ([place] [facility]).  CSW further explained to patient that CSW works with patient's [place], also with [place] [facility], Jackelyn Poling. CSW then explained the reason for the call, indicating that Mrs. Lavina Hamman thought that patient would benefit from social work services and resources to assist with completion of Advanced Directives, financial resources, and counseling and supportive services for symptoms of anxiety and depression.  CSW obtained two HIPAA compliant identifiers from patient, which included patient's name and date of birth. Patient admits that he is interested in receiving all of the above resources; however, patient indicated that he was "in the middle of something" when CSW called, requesting to return CSW's call at his earliest convenience. CSW voiced understanding, providing patient with CSW's contact information and explaining that CSW will await a return call from patient.  CSW will make arrangements to contact patient again in one week, if a return call is not received from patient in the meantime.  Patient is scheduled for a return call on Friday, January 11, 2018 at 9:00 AM. Nat Christen, BSW, MSW, [place]  Licensed Clinical Social Worker  [place]  Mailing [place]. [address], [place], [place] [address] Physical Address-[address], [place], [place] [address] Toll Free Main # [phone] Fax # [phone] Cell # [phone]  Office # [phone] [email or web address]

## 2018-01-07 ENCOUNTER — Other Ambulatory Visit: Payer: Self-pay | Admitting: Pharmacy Technician

## 2018-01-07 NOTE — Patient Outreach (Signed)
[facility]) Care Management  01/07/2018  Dewane Timson [birth year] [id number]    Received [place] patient assistance referral from [place] for [place], [place].  Prepared patient portion to be mailed. Mailed provider portion to Tula Nakayama for [employer] per program requirements and faxed provider portion to Francetta Found for [employer] and [employer].  Will followup with patient in 7-10 business days to confirm application has been received.  Jabes Primo P. Davisha Linthicum, [place] [facility] [phone]

## 2018-01-08 ENCOUNTER — Ambulatory Visit: Payer: Self-pay | Admitting: Pharmacist

## 2018-01-08 NOTE — Telephone Encounter (Signed)
Response from [facility] forwarded to dr simpson

## 2018-01-11 ENCOUNTER — Encounter: Payer: Self-pay | Admitting: *Deleted

## 2018-01-11 ENCOUNTER — Other Ambulatory Visit: Payer: Self-pay | Admitting: *Deleted

## 2018-01-11 NOTE — Patient Outreach (Signed)
[place] [facility]) Care Management  01/11/2018  Matthew [name] 12-Dec-1965 [id number]   CSW was able to follow-up with patient today to talk about the need for social work services and resources.  Two HIPAA compliant identifiers were met, as patient was able to recite his name and date of birth.  Patient denies the need for counseling and supportive services at this time, reporting that he is already established with a Psychiatrist, Dr. Harrington Challenger at [facility] [place].  Patient indicated that he is receiving medication management and psychotherapeutic services through [facility].  Patient was encouraged to continue to take his antidepressant medication exactly as prescribed.  CSW agreed to refer patient to Daneen Schick, Social Work colleague, also with [facility], to assist patient with additional social work needs and services.  CSW explained to patient that Mrs. Humble will assist patient with completion of his Advanced Directives (Living Will and [place] documents), as well as provide patient with a list of Psychologist, sport and exercise.  Patient is aware that he will be receiving a call from Mrs. Humble within the next 10 business days.  Patient voiced understanding and was agreeable to this plan.  CSW will perform a case closure on patient, as all goals of treatment have been met from this social workers standpoint and no additional social work needs have been identified at this time.  CSW will notify patient's Pharmacist with [place] [facility], Denyse Amass of CSW's plans to close patient's case.  CSW will fax an update to patient's Primary Care Physician, Dr. Tula Nakayama to ensure that they are aware of CSW's involvement with patient's plan of care.    Nat Christen, BSW, MSW, LCSW  Licensed [facility]  Mailing [place], [place], [place] [address] Physical Address-[address], [place], [place] [address] Toll Free Main # [phone] Fax # [phone] Cell # [phone]  Office # [phone] [email or web address]

## 2018-01-15 ENCOUNTER — Ambulatory Visit: Payer: Self-pay | Admitting: Pharmacist

## 2018-01-16 ENCOUNTER — Other Ambulatory Visit: Payer: Self-pay | Admitting: Family Medicine

## 2018-01-16 ENCOUNTER — Other Ambulatory Visit: Payer: Self-pay

## 2018-01-16 NOTE — Patient Outreach (Signed)
[place] [facility]) Care Management  01/16/2018  Matthew [name] 24-Oct-1965 [id number]  Successful outreach to the patient on today's date, HIPAA identifiers confirmed. BSW introduced self to the patient and the reason for today's call, indicating the patient had been referred to [facility] for assistance with completion of an advance directive. The patient denies current questions regarding this document. BSW briefly explained advance directive to patient.The patient is in agreement for BSW to mail an advance directive packet along with an EMMI educational handout to the patients home.  Plan: BSW to contact the patient in the next three weeks to confirm receipt of resource and assist with completion.  Daneen Schick, BSW, CDP [facility] [phone]

## 2018-01-17 ENCOUNTER — Other Ambulatory Visit: Payer: Self-pay | Admitting: Pharmacy Technician

## 2018-01-17 NOTE — Patient Outreach (Signed)
[place] [facility]) Care Management  01/17/2018  Matthew [name] [birth year] [id number]    Unsuccessful call placed to patient regarding his medication assistance applications for [employer] ([place]) & [employer] ([place]).  A HIPAA complaint voicemail was left for patient to inquire if he had received the applications.  Will followup with patient in 5-7 business days if call is not returned.  [name] P. Orlandus Borowski, [place] [facility] [phone]

## 2018-01-17 NOTE — Patient Outreach (Signed)
[place] [facility]) Care Management  01/17/2018  Matthew [name] Apr 28, 1965 [id number]    Addendum:  Incoming call received from patient, HIPAA identifiers confirmed.  Inquired if patient had received his patient assistance applications and he said he had not.  Informed patient about our mailing system and the fact that there was a holiday this week and explained that I would followup with him next week.  Plan to followup with patient in 3-5 business days to see if he has received his applications.  [name] P. [name], [name] [facility] [phone]

## 2018-01-21 ENCOUNTER — Other Ambulatory Visit: Payer: Self-pay | Admitting: Pharmacy Technician

## 2018-01-21 NOTE — Patient Outreach (Signed)
[place] [facility]) Care Management  01/21/2018  Matthew [name] Apr 19, 1965 [id number]    Unsuccessful outgoing call placed to patient to inquire about his medication assistance applications for [employer] ([place]) and [employer] (Zetia).  Unfortunately patient did not answer the phone, HIPAA compliant voicemail left.   Will followup with patient in 3-5 business days if call is not returned.  Lanyia Jewel P. Arianny Pun, [place] [facility] [phone]

## 2018-01-24 ENCOUNTER — Other Ambulatory Visit: Payer: Self-pay | Admitting: Pharmacy Technician

## 2018-01-24 NOTE — Patient Outreach (Signed)
[place] [facility]) Care Management  01/24/2018  Marcin Holte 1965/04/09 [id number]    2nd Unsuccessful outgoing call placed to patient to inquire about his medication assistance applications for [employer] ([place]) and [employer] (Zet[place]).  Unfortunately patient did not answer the phone, HIPAA compliant voicemail left.   Will followup with patient in 3-5 business days if call is not returned.   Kiyah Demartini P. Maleeya Peterkin, [place] Management [phone]

## 2018-01-24 NOTE — Patient Outreach (Signed)
[place] [facility]) Care Management  01/24/2018  Matthew [name] September 29, 1965 [id number]   Addendum:  Received incoming return phonecall from patient in regards to patient assistance.  Inquired if patient had received his patient assistance applications. Patient said "I think so and I put them to the side, call back in the am and I can let you know for sure."  Will followup with patient in 1-2 business days to confirm he has received his applications.  Mikela Senn P. Sharnay Cashion, [name] [facility] [phone]

## 2018-01-25 ENCOUNTER — Other Ambulatory Visit: Payer: Self-pay | Admitting: Pharmacy Technician

## 2018-01-25 NOTE — Patient Outreach (Signed)
[place] [facility]) Care Management  01/25/2018  Mihail Prettyman September 09, 1965 [id number]   Successful outgoing call placed to patient regarding his medication assistance applications. HIPAA identifiers obtained.  Called patient this am per his request from yesterday's conversation as documented to inquire if he had received his applications.  Mr. Laverdure said he had not had a chance to look like he said he would and that he was "currently upstairs and needed about 30 minutes" before he could check. Verified patient had my number and asked him to call me at his earliest convenience regarding his applications.  Will followup with patient in 3-5 business days if call is not returned.  Isao Seltzer P. Jensen [place], [place] Management [phone]

## 2018-02-04 ENCOUNTER — Other Ambulatory Visit: Payer: Self-pay | Admitting: Pharmacy Technician

## 2018-02-04 NOTE — Patient Outreach (Signed)
[place] [facility]) Care Management  02/04/2018  Matthew [name] 05-22-1965 [id number]  Successful outgoing call placed to patient regarding his medication assistance applications for Basag[employer], Trulicity & Zetia through [employer] respectively.  Patient has been contacted numerous times regarding the receipt of his patient assistance applications.  When contacted today patient stated "I think I have received them, I put them to the side and now I cant find them". Informed patient that I could mail them out again patient stated "I know they are here somewhere" and said that I did not need to mail them out again.  Confirmed patient had my name and number and for him to contact me once they have been "located"  Will route note to [place] as patient does not seem to be engaged in the patient assistance process.  Adjoa Althouse P. Edythe Riches, [place] Management [phone]

## 2018-02-06 ENCOUNTER — Telehealth: Payer: Self-pay | Admitting: Pharmacist

## 2018-02-06 ENCOUNTER — Telehealth: Payer: Self-pay | Admitting: *Deleted

## 2018-02-06 MED ORDER — BACLOFEN 20 MG PO TABS: 20.0000 mg | ORAL_TABLET | Freq: Three times a day (TID) | ORAL | 0 refills | Status: AC

## 2018-02-06 NOTE — Telephone Encounter (Signed)
Called patient to get more details on symptoms, no answer, left message

## 2018-02-06 NOTE — Telephone Encounter (Signed)
Still having back spasms, will you refill flexeril

## 2018-02-06 NOTE — Telephone Encounter (Signed)
Baclofen escribed to [facility] in response to faxed request from them/fim

## 2018-02-06 NOTE — Telephone Encounter (Signed)
Called patient no answer left message  

## 2018-02-06 NOTE — Telephone Encounter (Signed)
Already on baclofen a muscle relaxant ferom Dr Jannifer [place] cannot have both,pls explain

## 2018-02-06 NOTE — Telephone Encounter (Signed)
PT needs a refill on flexoril. Pt is out of the medication. Can be sent to [facility] in [place].

## 2018-02-06 NOTE — Patient Outreach (Signed)
[place] [facility]) Care Management  02/06/2018  Joshual Terrio 23-Jun-1965 [id number]   Patient's case is being closed due to failure to return his patient assistance forms. [facility] Certified Technician, [employer], has reached out to the patient several times in an attempt to get the forms back from him that were sent. She most recently spoke to him on 02/04/18. Patient has our contact information and can reach out to [place] if he is interested in continuing the medication assistance process.  Plan: Close patient's case. [place] Work is still involved in the patient's case.  I will send a note to the social worker alerting her of pharmacy case closure.  Elayne Guerin, PharmD, [place] Clinical Pharmacist [phone]

## 2018-02-07 ENCOUNTER — Ambulatory Visit: Payer: Self-pay

## 2018-02-07 ENCOUNTER — Other Ambulatory Visit: Payer: Self-pay

## 2018-02-07 NOTE — Patient Outreach (Signed)
[place] [facility]) Care Management  02/07/2018  Laban Orourke 1966/02/01 [id number]  Unsuccessful outreach on today's date to confirm receipt of mailed resources. BSW left a HIPAA compliant voice message requesting a return call.  Plan: BSW to attempt to outreach the patient within the next four business days.  Daneen Schick, BSW, CDP [facility] [phone]

## 2018-02-08 ENCOUNTER — Other Ambulatory Visit: Payer: Self-pay | Admitting: Family Medicine

## 2018-02-12 ENCOUNTER — Other Ambulatory Visit: Payer: Self-pay

## 2018-02-12 NOTE — Patient Outreach (Signed)
[place] [facility]) Care Management  02/12/2018  Matthew [name] [birth year] [id number]  Successful outreach to the patient on today's date, HIPAA identifiers confirmed. The patient is able to confirm receipt of advance directive packet. The patient declines BSW assistance with completion. The patient denies having any questions regarding document. BSW briefly reviewed the document with the patient telephonically. BSW educated the patient on the importance of providing a notarized copy to all physicians and to whom he identifies as his healthcare power of attorney. BSW further explained that the patient should keep the original notarized copy. The patient stated understanding.  BSW to perform a case closure as the patients goal has been met. The patient is in agreement with this closure and understands he may call [place] [facility] to place a self referral if future care management services are needed. BSW to send a case closure letter to the patients primary physician.  Daneen Schick, BSW, CDP [facility] [phone]

## 2018-02-13 ENCOUNTER — Ambulatory Visit (HOSPITAL_BASED_OUTPATIENT_CLINIC_OR_DEPARTMENT_OTHER): Payer: Medicare Other | Attending: Cardiology | Admitting: Cardiology

## 2018-02-13 VITALS — Ht 69.0 in | Wt 253.0 lb

## 2018-02-13 DIAGNOSIS — G4733 Obstructive sleep apnea (adult) (pediatric): Secondary | ICD-10-CM | POA: Diagnosis not present

## 2018-02-20 NOTE — Procedures (Signed)
   Patient Name: Matthew [name], Matthew [name] Date: 02/13/2018 Gender: Male D.O.B: [birth year] Age (years): 52 Referring Provider: Brien Few Charlcie Cradle PA-C Height (inches): 28 Interpreting Physician: Fransico Him MD, ABSM Weight (lbs): 266 RPSGT: Rebekah [place] BMI: 39 MRN: [id number] Neck Size: 18.00  CLINICAL INFORMATION The patient is referred for a BiPAP titration to treat sleep apnea.  SLEEP STUDY TECHNIQUE As per the AASM Manual for the Scoring of Sleep and Associated Events v2.3 (April 2016) with a hypopnea requiring 4% desaturations.  The channels recorded and monitored were frontal, central and occipital EEG, electrooculogram (EOG), submentalis EMG (chin), nasal and oral airflow, thoracic and abdominal wall motion, anterior tibialis EMG, snore microphone, electrocardiogram, and pulse oximetry. Bilevel positive airway pressure (BPAP) was initiated at the beginning of the study and titrated to treat sleep-disordered breathing.  MEDICATIONS Medications self-administered by patient taken the night of the study : BACLOFEN, BUSPIRONE HCL, MIRTAZAPINE  RESPIRATORY PARAMETERS Optimal IPAP Pressure (cm): N/A  AHI at Optimal Pressure (/hr) N/A Optimal EPAP Pressure (cm):N/A  Overall Minimal O2 (%):71.0  Minimal O2 at Optimal Pressure (%): N/A  SLEEP ARCHITECTURE Start Time:11:11:30 PM  Stop Time:5:13:11 AM  Total Time (min):361.7  Total Sleep Time (min):102.5 Sleep Latency (min):0.5  Sleep Efficiency (%):28.3%  REM Latency (min): N/A WASO (min): 258.7 Stage N1 (%): 4.9%  Stage N2 (%): 95.1%  Stage N3 (%): 0.0%  Stage R (%): 0 Supine (%):16.88  Arousal Index (/hr):84.3   CARDIAC DATA The 2 lead EKG demonstrated sinus rhythm. The mean heart rate was 70.5 beats per minute. Other EKG findings include: PVCs.  LEG MOVEMENT DATA The total Periodic Limb Movements of Sleep (PLMS) were 0. The PLMS index was 0.0. A PLMS index of <15 is considered normal in adults.  IMPRESSIONS - An  optimal PAP pressure could not be selected for this patient due to ongoing respiratory events.  - Severe Central Sleep Apnea was noted during this titration (CAI = 62.6/h). - Severe oxygen desaturations were observed during this titration (min O2 = 71.0%). - The patient snored with soft snoring volume. - 2-lead EKG demonstrated: PVCs - Clinically significant periodic limb movements were not noted during this study. Arousals associated with PLMs were rare.  DIAGNOSIS - Obstructive Sleep Apnea (327.23 [G47.33 ICD-10]) - Central Sleep Apnea - Nocturnal Hypoxemia  RECOMMENDATIONS - Recommend repeat in lab study using BiPAP S/T.   - Avoid alcohol, sedatives and other CNS depressants that may worsen sleep apnea and disrupt normal sleep architecture. - Sleep hygiene should be reviewed to assess factors that may improve sleep quality. - Weight management and regular exercise should be initiated or continued.  [Electronically signed] 02/20/2018 04:43 PM  Fransico Him MD, ABSM Diplomate, American Board of Sleep Medicine

## 2018-02-22 ENCOUNTER — Telehealth: Payer: Self-pay | Admitting: *Deleted

## 2018-02-22 ENCOUNTER — Encounter (HOSPITAL_COMMUNITY): Payer: Self-pay | Admitting: Emergency Medicine

## 2018-02-22 ENCOUNTER — Other Ambulatory Visit: Payer: Self-pay

## 2018-02-22 ENCOUNTER — Inpatient Hospital Stay (HOSPITAL_COMMUNITY)
Admission: EM | Admit: 2018-02-22 | Discharge: 2018-04-06 | DRG: 673 | Disposition: E | Payer: Medicare Other | Attending: Pulmonary Disease | Admitting: Pulmonary Disease

## 2018-02-22 DIAGNOSIS — G9340 Encephalopathy, unspecified: Secondary | ICD-10-CM | POA: Diagnosis not present

## 2018-02-22 DIAGNOSIS — N401 Enlarged prostate with lower urinary tract symptoms: Secondary | ICD-10-CM | POA: Diagnosis present

## 2018-02-22 DIAGNOSIS — Z8349 Family history of other endocrine, nutritional and metabolic diseases: Secondary | ICD-10-CM

## 2018-02-22 DIAGNOSIS — M5440 Lumbago with sciatica, unspecified side: Secondary | ICD-10-CM | POA: Diagnosis present

## 2018-02-22 DIAGNOSIS — Z515 Encounter for palliative care: Secondary | ICD-10-CM | POA: Diagnosis not present

## 2018-02-22 DIAGNOSIS — G9341 Metabolic encephalopathy: Secondary | ICD-10-CM

## 2018-02-22 DIAGNOSIS — Z823 Family history of stroke: Secondary | ICD-10-CM

## 2018-02-22 DIAGNOSIS — F23 Brief psychotic disorder: Secondary | ICD-10-CM | POA: Diagnosis not present

## 2018-02-22 DIAGNOSIS — I132 Hypertensive heart and chronic kidney disease with heart failure and with stage 5 chronic kidney disease, or end stage renal disease: Secondary | ICD-10-CM | POA: Diagnosis not present

## 2018-02-22 DIAGNOSIS — E1122 Type 2 diabetes mellitus with diabetic chronic kidney disease: Secondary | ICD-10-CM | POA: Diagnosis not present

## 2018-02-22 DIAGNOSIS — N184 Chronic kidney disease, stage 4 (severe): Secondary | ICD-10-CM

## 2018-02-22 DIAGNOSIS — I2510 Atherosclerotic heart disease of native coronary artery without angina pectoris: Secondary | ICD-10-CM | POA: Diagnosis not present

## 2018-02-22 DIAGNOSIS — R509 Fever, unspecified: Secondary | ICD-10-CM | POA: Diagnosis not present

## 2018-02-22 DIAGNOSIS — Z7982 Long term (current) use of aspirin: Secondary | ICD-10-CM

## 2018-02-22 DIAGNOSIS — I5042 Chronic combined systolic (congestive) and diastolic (congestive) heart failure: Secondary | ICD-10-CM | POA: Diagnosis present

## 2018-02-22 DIAGNOSIS — N2581 Secondary hyperparathyroidism of renal origin: Secondary | ICD-10-CM | POA: Diagnosis present

## 2018-02-22 DIAGNOSIS — R400 Somnolence: Secondary | ICD-10-CM | POA: Diagnosis not present

## 2018-02-22 DIAGNOSIS — Z8051 Family history of malignant neoplasm of kidney: Secondary | ICD-10-CM

## 2018-02-22 DIAGNOSIS — Z794 Long term (current) use of insulin: Secondary | ICD-10-CM

## 2018-02-22 DIAGNOSIS — Z79899 Other long term (current) drug therapy: Secondary | ICD-10-CM

## 2018-02-22 DIAGNOSIS — E113419 Type 2 diabetes mellitus with severe nonproliferative diabetic retinopathy with macular edema, unspecified eye: Secondary | ICD-10-CM | POA: Diagnosis present

## 2018-02-22 DIAGNOSIS — Z800 Family history of malignant neoplasm of digestive organs: Secondary | ICD-10-CM

## 2018-02-22 DIAGNOSIS — I462 Cardiac arrest due to underlying cardiac condition: Secondary | ICD-10-CM | POA: Diagnosis not present

## 2018-02-22 DIAGNOSIS — E1169 Type 2 diabetes mellitus with other specified complication: Secondary | ICD-10-CM | POA: Diagnosis present

## 2018-02-22 DIAGNOSIS — Z8674 Personal history of sudden cardiac arrest: Secondary | ICD-10-CM

## 2018-02-22 DIAGNOSIS — R0602 Shortness of breath: Secondary | ICD-10-CM

## 2018-02-22 DIAGNOSIS — S299XXA Unspecified injury of thorax, initial encounter: Secondary | ICD-10-CM | POA: Diagnosis not present

## 2018-02-22 DIAGNOSIS — F418 Other specified anxiety disorders: Secondary | ICD-10-CM | POA: Diagnosis not present

## 2018-02-22 DIAGNOSIS — S80812A Abrasion, left lower leg, initial encounter: Secondary | ICD-10-CM | POA: Diagnosis present

## 2018-02-22 DIAGNOSIS — N186 End stage renal disease: Secondary | ICD-10-CM

## 2018-02-22 DIAGNOSIS — R0681 Apnea, not elsewhere classified: Secondary | ICD-10-CM | POA: Diagnosis not present

## 2018-02-22 DIAGNOSIS — I255 Ischemic cardiomyopathy: Secondary | ICD-10-CM | POA: Diagnosis present

## 2018-02-22 DIAGNOSIS — Z4659 Encounter for fitting and adjustment of other gastrointestinal appliance and device: Secondary | ICD-10-CM

## 2018-02-22 DIAGNOSIS — S199XXA Unspecified injury of neck, initial encounter: Secondary | ICD-10-CM | POA: Diagnosis not present

## 2018-02-22 DIAGNOSIS — Z66 Do not resuscitate: Secondary | ICD-10-CM | POA: Diagnosis not present

## 2018-02-22 DIAGNOSIS — Z599 Problem related to housing and economic circumstances, unspecified: Secondary | ICD-10-CM

## 2018-02-22 DIAGNOSIS — Z6841 Body Mass Index (BMI) 40.0 and over, adult: Secondary | ICD-10-CM

## 2018-02-22 DIAGNOSIS — L89152 Pressure ulcer of sacral region, stage 2: Secondary | ICD-10-CM | POA: Diagnosis not present

## 2018-02-22 DIAGNOSIS — Z8249 Family history of ischemic heart disease and other diseases of the circulatory system: Secondary | ICD-10-CM

## 2018-02-22 DIAGNOSIS — F319 Bipolar disorder, unspecified: Secondary | ICD-10-CM | POA: Diagnosis present

## 2018-02-22 DIAGNOSIS — E785 Hyperlipidemia, unspecified: Secondary | ICD-10-CM | POA: Diagnosis present

## 2018-02-22 DIAGNOSIS — R0689 Other abnormalities of breathing: Secondary | ICD-10-CM | POA: Diagnosis not present

## 2018-02-22 DIAGNOSIS — R296 Repeated falls: Secondary | ICD-10-CM | POA: Diagnosis present

## 2018-02-22 DIAGNOSIS — E1165 Type 2 diabetes mellitus with hyperglycemia: Secondary | ICD-10-CM

## 2018-02-22 DIAGNOSIS — N138 Other obstructive and reflux uropathy: Secondary | ICD-10-CM | POA: Diagnosis not present

## 2018-02-22 DIAGNOSIS — Z801 Family history of malignant neoplasm of trachea, bronchus and lung: Secondary | ICD-10-CM

## 2018-02-22 DIAGNOSIS — N179 Acute kidney failure, unspecified: Secondary | ICD-10-CM | POA: Diagnosis not present

## 2018-02-22 DIAGNOSIS — H538 Other visual disturbances: Secondary | ICD-10-CM | POA: Diagnosis present

## 2018-02-22 DIAGNOSIS — D696 Thrombocytopenia, unspecified: Secondary | ICD-10-CM | POA: Diagnosis present

## 2018-02-22 DIAGNOSIS — IMO0001 Reserved for inherently not codable concepts without codable children: Secondary | ICD-10-CM

## 2018-02-22 DIAGNOSIS — G2581 Restless legs syndrome: Secondary | ICD-10-CM | POA: Diagnosis present

## 2018-02-22 DIAGNOSIS — Z811 Family history of alcohol abuse and dependence: Secondary | ICD-10-CM

## 2018-02-22 DIAGNOSIS — Z419 Encounter for procedure for purposes other than remedying health state, unspecified: Secondary | ICD-10-CM

## 2018-02-22 DIAGNOSIS — R627 Adult failure to thrive: Secondary | ICD-10-CM | POA: Diagnosis not present

## 2018-02-22 DIAGNOSIS — Z9089 Acquired absence of other organs: Secondary | ICD-10-CM

## 2018-02-22 DIAGNOSIS — D631 Anemia in chronic kidney disease: Secondary | ICD-10-CM | POA: Diagnosis not present

## 2018-02-22 DIAGNOSIS — G92 Toxic encephalopathy: Secondary | ICD-10-CM | POA: Diagnosis present

## 2018-02-22 DIAGNOSIS — F17290 Nicotine dependence, other tobacco product, uncomplicated: Secondary | ICD-10-CM | POA: Diagnosis present

## 2018-02-22 DIAGNOSIS — R338 Other retention of urine: Secondary | ICD-10-CM | POA: Diagnosis present

## 2018-02-22 DIAGNOSIS — G4733 Obstructive sleep apnea (adult) (pediatric): Secondary | ICD-10-CM | POA: Diagnosis not present

## 2018-02-22 DIAGNOSIS — T4395XA Adverse effect of unspecified psychotropic drug, initial encounter: Secondary | ICD-10-CM | POA: Diagnosis present

## 2018-02-22 DIAGNOSIS — D638 Anemia in other chronic diseases classified elsewhere: Secondary | ICD-10-CM | POA: Diagnosis present

## 2018-02-22 DIAGNOSIS — I472 Ventricular tachycardia: Secondary | ICD-10-CM | POA: Diagnosis not present

## 2018-02-22 DIAGNOSIS — Z818 Family history of other mental and behavioral disorders: Secondary | ICD-10-CM

## 2018-02-22 DIAGNOSIS — N189 Chronic kidney disease, unspecified: Secondary | ICD-10-CM

## 2018-02-22 DIAGNOSIS — J9602 Acute respiratory failure with hypercapnia: Secondary | ICD-10-CM | POA: Diagnosis not present

## 2018-02-22 DIAGNOSIS — Z9049 Acquired absence of other specified parts of digestive tract: Secondary | ICD-10-CM

## 2018-02-22 DIAGNOSIS — E161 Other hypoglycemia: Secondary | ICD-10-CM | POA: Diagnosis not present

## 2018-02-22 DIAGNOSIS — R402434 Glasgow coma scale score 3-8, 24 hours or more after hospital admission: Secondary | ICD-10-CM | POA: Diagnosis not present

## 2018-02-22 DIAGNOSIS — E162 Hypoglycemia, unspecified: Secondary | ICD-10-CM | POA: Diagnosis not present

## 2018-02-22 DIAGNOSIS — Z992 Dependence on renal dialysis: Secondary | ICD-10-CM

## 2018-02-22 DIAGNOSIS — Z9849 Cataract extraction status, unspecified eye: Secondary | ICD-10-CM

## 2018-02-22 DIAGNOSIS — N19 Unspecified kidney failure: Secondary | ICD-10-CM

## 2018-02-22 DIAGNOSIS — S0990XA Unspecified injury of head, initial encounter: Secondary | ICD-10-CM | POA: Diagnosis not present

## 2018-02-22 DIAGNOSIS — M21372 Foot drop, left foot: Secondary | ICD-10-CM | POA: Diagnosis present

## 2018-02-22 DIAGNOSIS — Z888 Allergy status to other drugs, medicaments and biological substances status: Secondary | ICD-10-CM

## 2018-02-22 DIAGNOSIS — Z9181 History of falling: Secondary | ICD-10-CM

## 2018-02-22 DIAGNOSIS — Z833 Family history of diabetes mellitus: Secondary | ICD-10-CM

## 2018-02-22 DIAGNOSIS — Z8371 Family history of colonic polyps: Secondary | ICD-10-CM

## 2018-02-22 DIAGNOSIS — J9601 Acute respiratory failure with hypoxia: Secondary | ICD-10-CM | POA: Diagnosis not present

## 2018-02-22 DIAGNOSIS — E1142 Type 2 diabetes mellitus with diabetic polyneuropathy: Secondary | ICD-10-CM | POA: Diagnosis present

## 2018-02-22 DIAGNOSIS — Z7951 Long term (current) use of inhaled steroids: Secondary | ICD-10-CM

## 2018-02-22 DIAGNOSIS — L8990 Pressure ulcer of unspecified site, unspecified stage: Secondary | ICD-10-CM

## 2018-02-22 DIAGNOSIS — Z79890 Hormone replacement therapy (postmenopausal): Secondary | ICD-10-CM

## 2018-02-22 DIAGNOSIS — E872 Acidosis, unspecified: Secondary | ICD-10-CM

## 2018-02-22 DIAGNOSIS — Z8489 Family history of other specified conditions: Secondary | ICD-10-CM

## 2018-02-22 DIAGNOSIS — N320 Bladder-neck obstruction: Secondary | ICD-10-CM | POA: Diagnosis present

## 2018-02-22 DIAGNOSIS — I10 Essential (primary) hypertension: Secondary | ICD-10-CM | POA: Diagnosis not present

## 2018-02-22 DIAGNOSIS — M21371 Foot drop, right foot: Secondary | ICD-10-CM | POA: Diagnosis present

## 2018-02-22 DIAGNOSIS — Z881 Allergy status to other antibiotic agents status: Secondary | ICD-10-CM

## 2018-02-22 DIAGNOSIS — W19XXXA Unspecified fall, initial encounter: Secondary | ICD-10-CM | POA: Diagnosis present

## 2018-02-22 DIAGNOSIS — Z95810 Presence of automatic (implantable) cardiac defibrillator: Secondary | ICD-10-CM

## 2018-02-22 DIAGNOSIS — I129 Hypertensive chronic kidney disease with stage 1 through stage 4 chronic kidney disease, or unspecified chronic kidney disease: Secondary | ICD-10-CM | POA: Diagnosis not present

## 2018-02-22 DIAGNOSIS — S80811A Abrasion, right lower leg, initial encounter: Secondary | ICD-10-CM | POA: Diagnosis present

## 2018-02-22 DIAGNOSIS — Z951 Presence of aortocoronary bypass graft: Secondary | ICD-10-CM

## 2018-02-22 LAB — I-STAT ARTERIAL BLOOD GAS, ED
Acid-base deficit: 9 mmol/L — ABNORMAL HIGH (ref 0.0–2.0)
Bicarbonate: 18.1 mmol/L — ABNORMAL LOW (ref 20.0–28.0)
O2 Saturation: 97 %
Patient temperature: 97.7
TCO2: 19 mmol/L — ABNORMAL LOW (ref 22–32)
pCO2 arterial: 40.9 mmHg (ref 32.0–48.0)
pH, Arterial: 7.25 — ABNORMAL LOW (ref 7.350–7.450)
pO2, Arterial: 103 mmHg (ref 83.0–108.0)

## 2018-02-22 LAB — CBG MONITORING, ED: Glucose-Capillary: 120 mg/dL — ABNORMAL HIGH (ref 70–99)

## 2018-02-22 MED ORDER — SODIUM CHLORIDE 0.9 % IV BOLUS (SEPSIS)
1000.0000 mL | Freq: Once | INTRAVENOUS | Status: AC
  Administered 2018-02-23: 1000 mL via INTRAVENOUS

## 2018-02-22 NOTE — ED Notes (Signed)
ED Provider at bedside. 

## 2018-02-22 NOTE — ED Notes (Signed)
Pt placed on 2L [place] d/t sats dropping into the 80s while falling asleep.

## 2018-02-22 NOTE — Telephone Encounter (Signed)
-----   Message from Sueanne Margarita, MD sent at 02/20/2018  4:46 PM EST ----- Please let patient know that they had a unssuccessful PAP titration due to severity of OSA.  Please set up in lab study with BiPAP S/T ASAP

## 2018-02-22 NOTE — ED Provider Notes (Signed)
TIME SEEN: 11:40 PM  CHIEF COMPLAINT: "I am feeling off"  HPI: Patient is a 52 year old male with history of CAD, CHF with history of cardiac arrest and ventricular fibrillation status post pacemaker/AICD, insulin-dependent diabetes, hypertension, hyperlipidemia, chronic kidney disease who presents to the emergency department stating that he feels confused.  States this has been ongoing today.  Reports yesterday he felt normal.  Unable to describe this any further.  He denies any headache, chest pain or shortness of breath, abdominal pain.  No recent vomiting or diarrhea.  No changes in medications.  No drug or alcohol use.  States he did have a fall yesterday taking his father to the doctor's office.  Has multiple abrasions to the lower extremities.  Does not think he hit his head.  Denies being on antiplatelets or anticoagulants.  Brought in by [facility].  States he requested to come to [facility].  Blood sugar was normal with EMS.  He was given D10 by EMS even though blood sugar was normal.  ROS: See HPI Constitutional: no fever  Eyes: no drainage  ENT: no runny nose   Cardiovascular:  no chest pain  Resp: no SOB  GI: no vomiting GU: no dysuria Integumentary: no rash  Allergy: no hives  Musculoskeletal: no leg swelling  Neurological: no slurred speech ROS otherwise negative  PAST MEDICAL HISTORY/PAST SURGICAL HISTORY:  Past Medical History:  Diagnosis Date  . Abnormality of gait 04/08/2013  . AICD (automatic cardioverter/defibrillator) present   . Anxiety    stress  . Anxiety and depression   . CAD (coronary artery disease)    a. OOH MI 6/11; presented with acute CHF; hosp course c/b VF arrest with VDRF, etc;   b s/p CABG: L-LAD/Dx, S-OM/dCFX;  c.Nuclear scan 8/12:  Inferior and inf-lat scar with minimal peri-infarct ischemia, EF 53%.; d.LHC 9/12:  LAD 90%, pD1 (small) 70%, prox large Dx 95%, L-LAD and Dx ok, CFX occluded, S-OM and CFX ok, RCA prox to prox/mid 60-70%.  Medical  management     . Cardiac arrest - ventricular fibrillation 08/26/2009  . Carpal tunnel syndrome of right wrist   . Cellulitis 2012   spider bite  . Cellulitis 06/2017   right upper extremity  . Chronic systolic heart failure ([place])   . CKD (chronic kidney disease)   . CKD stage 4 due to type 2 diabetes mellitus ([place]) 04/11/2016  . Coma ([place])   . Diabetic coma with ketoacidosis ([place])    Occured in 1994 where he spent 30 days in a coma . He develpoed infection of his leg and developing pancreatitis during that  time   . Diabetic polyneuropathy ([place]) 04/08/2013  . DM2 (diabetes mellitus, type 2) ([place])   . Dyslipidemia associated with type 2 diabetes mellitus ([place]) 09/26/2016  . Dysrhythmia   . Foot drop, bilateral 04/08/2013  . Headache(784.0)    sinus  . History of degenerative disc disease   . HLD (hyperlipidemia)   . HTN (hypertension)   . Hypercholesteremia   . Hypertension associated with stage 3 chronic kidney disease due to type 2 diabetes mellitus ([place]) 09/26/2016  . Ischemic cardiomyopathy    echo 10/11:  inf-septal and apical HK, EF 35%, mod LAE  . Neuropathy   . Nocturnal leg cramps   . Nocturnal leg cramps 04/02/2017  . Obesity   . OSA (obstructive sleep apnea) 02/02/2012  . Pancreatitis 2008  . Periodic limb movement 04/02/2017  . 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 Presence  of permanent cardiac pacemaker   . Retinal detachment    Bilateral, laser surgery on the right  . RLS (restless legs syndrome) 04/22/2015  . Thyroid disease   . Type II diabetes mellitus with renal manifestations ([place]) 07/10/2017  . 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 Uncontrolled type 2 diabetes mellitus with diabetic neuropathy, with long-term current use of insulin ([place]) 09/26/2016  . 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 Uncontrolled type 2 diabetes mellitus with stage  4 chronic kidney disease, with long-term current use of insulin ([place]) 09/26/2016    MEDICATIONS:  Prior to Admission medications   Medication Sig Start Date End Date Taking? Authorizing Provider  acetaminophen (TYLENOL) 500 MG tablet Take 1,000 mg by mouth every 8 (eight) hours as needed for mild pain.     [provider]  aspirin EC 81 MG tablet Take 81 mg by mouth daily.    [provider]  baclofen (LIORESAL) 20 MG tablet Take 1 tablet (20 mg total) by mouth 3 (three) times daily. Must be seen prior to future refills 02/06/18   Kathrynn Ducking, MD  busPIRone (BUSPAR) 10 MG tablet Take 1 tablet (10 mg total) by mouth 2 (two) times daily. 11/06/17   Cloria Spring, MD  ergocalciferol (VITAMIN D2) 50000 units capsule Take 1 capsule (50,000 Units total) by mouth once a week. One capsule once weekly 08/16/17   [name], MD  ezetimibe (ZETIA) 10 MG tablet Take 10 mg by mouth daily.    [provider]  FLUoxetine (PROZAC) 20 MG capsule Take 1 capsule (20 mg total) by mouth daily. 11/06/17 11/06/18  Cloria Spring, MD  fluticasone (FLONASE) 50 MCG/ACT nasal spray Place 2 sprays into both nostrils daily. 04/11/17   [name], MD  furosemide (LASIX) 40 MG tablet Take 3 tablets (120 mg total) by mouth daily. Patient taking differently: Take 120 mg by mouth daily. Takes 1 tablet twice daily --increases to 3 tablets daily with edema 11/23/17 02/21/18  Evans Lance, MD  Insulin Degludec (TRESIBA) 100 UNIT/ML SOLN     [provider]  Insulin Isophane & Regular Human (NOVOLIN 70/30 FLEXPEN RELION) (70-30) 100 UNIT/ML PEN Inject 45 Units into the skin 2 (two) times daily.    Gerome Apley, MD  lactobacillus acidophilus & bulgar (LACTINEX) chewable tablet Chew 1 tablet by mouth 3 (three) times daily with meals. 07/12/17   Regalado, Belkys A, MD  Melatonin 10 MG TABS Take 10 mg by mouth at bedtime.    [provider]  methylcellulose (ARTIFICIAL  TEARS) 1 % ophthalmic solution Place 1 drop into both eyes 2 (two) times daily as needed (dry eyes).    [provider]  metoprolol succinate (TOPROL-XL) 50 MG 24 hr tablet Take 1 tablet (50 mg total) by mouth daily. Patient taking differently: Take 25 mg by mouth daily.  11/09/17   Baldwin Jamaica, PA-C  mirtazapine (REMERON) 30 MG tablet Take 1 tablet (30 mg total) by mouth at bedtime. 11/06/17   Cloria Spring, MD  Multiple Vitamin (MULTIVITAMIN) tablet Take 1 tablet by mouth daily.      [provider]  nitroGLYCERIN (NITROSTAT) 0.4 MG SL tablet Place 1 tablet (0.4 mg total) under the tongue every 5 (five) minutes as needed for chest pain. 01/21/16 01/26/20  Evans Lance, MD  Omega-3 Fatty Acids ([place]  OIL BURP-LESS PO) Take 1 tablet by mouth daily.      [provider]  pramipexole (MIRAPEX) 0.125 MG tablet Take 1 tablet (0.125 mg total) by mouth at bedtime. 04/02/17   Kathrynn Ducking, MD  predniSONE (DELTASONE) 10 MG tablet Take 1 tablet (10 mg total) by mouth 2 (two) times daily with a meal. 12/20/17   [name], MD  Propylhexedrine [facility]) INHA Place 1 each into the nose 2 (two) times daily as needed (congestion).     [provider]  rosuvastatin (CRESTOR) 20 MG tablet TAKE 1 TABLET BY MOUTH  DAILY 02/08/18   [name], MD  rosuvastatin (CRESTOR) 40 MG tablet Take 1 tablet (40 mg total) by mouth daily. 11/09/17   Baldwin Jamaica, PA-C  vitamin C (VITAMIN C) 1000 MG tablet Take 1 tablet (1,000 mg total) by mouth daily. 07/04/17   Roxan Hockey, MD    ALLERGIES:  Allergies  Allergen Reactions  . Ace Inhibitors Cough  . Cephalexin Other (See Comments)    Resistant to antibiotic  . Beta Adrenergic Blockers Cough    SOCIAL HISTORY:  Social History   Tobacco Use  . Smoking status: Former Smoker    Types: Cigars  . Smokeless tobacco: Current User    Types: Snuff  . Tobacco comment: Occasional cigar  Substance Use Topics   . Alcohol use: No    Alcohol/week: 0.0 standard drinks    Comment: rarely    FAMILY HISTORY: Family History  Problem Relation Age of Onset  . Allergies Mother   . Diabetes Mother   . Hypertension Mother   . Hyperlipidemia Mother   . Dementia Mother   . Colon cancer Mother   . Lung cancer Mother   . Kidney cancer Mother   . Colon polyps Mother   . Stroke Father   . Allergies Father   . Dementia Father   . Alcohol abuse Father   . Colon polyps Father   . Diabetes Sister   . Heart disease Sister   . Anxiety disorder Sister   . Cancer Maternal Grandmother        unknown type  . Schizophrenia Paternal Uncle   . Schizophrenia Cousin     EXAM: BP (!) 163/80   Pulse (!) 101   Temp 97.7 F (36.5 C) (Oral)   Resp 12   Ht 5\' 9"  (1.753 m)   Wt 115.7 kg   SpO2 92%   BMI 37.66 kg/m  CONSTITUTIONAL: Alert and oriented x3.  Appears confused but is able to answer questions.  Morbidly obese.  Afebrile. HEAD: Normocephalic; atraumatic EYES: Conjunctivae clear, PERRL, EOMI ENT: normal nose; no rhinorrhea; 3 dry appearing mucous membranes; pharynx without lesions noted; no dental injury; no septal hematoma NECK: Supple, no meningismus, no LAD; no midline spinal tenderness, step-off or deformity; trachea midline CARD: Regular and minimally tachycardic; S1 and S2 appreciated; no murmurs, no clicks, no rubs, no gallops RESP: Normal chest excursion without splinting or tachypnea; breath sounds clear and equal bilaterally; no wheezes, no rhonchi, no rales; patient is hypoxic on room air in the 60s CHEST:  chest wall stable, no crepitus or ecchymosis or deformity, nontender to palpation; no flail chest ABD/GI: Normal bowel sounds; non-distended; soft, non-tender, no rebound, no guarding; no ecchymosis or other lesions noted PELVIS:  stable, nontender to palpation BACK:  The back appears normal and is non-tender to palpation, there is no CVA tenderness; no midline spinal tenderness,  step-off or deformity  EXT: Normal ROM in all joints; non-tender to palpation; no edema; normal capillary refill; no cyanosis, no bony tenderness or bony deformity of patient's extremities, no joint effusion, compartments are soft, extremities are warm and well-perfused, no ecchymosis SKIN: Normal color for age and race; warm, multiple abrasions to bilateral lower extremities NEURO: Moves all extremities equally, no drift, reports normal sensation diffusely, cranial nerves II through XII intact, normal speech PSYCH: The patient's mood and manner are appropriate. Grooming and personal hygiene are appropriate.  MEDICAL DECISION MAKING: Patient here with altered mental status.  Differential is large including infection, dehydration, stroke, intracranial hemorrhage, substance abuse, hypercarbia.  Initially was hypoxic on room air in the emergency department but this is improved with nasal cannula.  States he does not wear oxygen chronically.  Denies feeling short of breath.  Will obtain labs, chest x-ray, CT of the head and cervical spine, urine.  ED PROGRESS: Patient's work-up reveals acute on chronic renal failure.  Suspect uremia as the cause of his encephalopathy today.  He has a metabolic acidosis secondary to uremia.  Otherwise work-up is unremarkable.   1:52 AM Discussed patient's case with hospitalist, Dr. Myna Hidalgo.  I have recommended admission and patient (and family if present) agree with this plan. Admitting physician will place admission orders.   I reviewed all nursing notes, vitals, pertinent previous records, EKGs, lab and urine results, imaging (as available).      EKG Interpretation  Date/Time:  Friday February 22 2018 23:31:20 EST Ventricular Rate:  102 PR Interval:    QRS Duration: 108 QT Interval:  368 QTC Calculation: 480 R Axis:   -38 Text Interpretation:  Sinus tachycardia Paired ventricular premature complexes Left axis deviation Low voltage, precordial leads Abnormal  R-wave progression, late transition Borderline repolarization abnormality Borderline prolonged QT interval No significant change since last tracing Confirmed by Pryor Curia [phone]) on 02/25/2018 11:58:50 PM        CRITICAL CARE Performed by: Cyril Mourning Alora Gorey   Total critical care time: 65 minutes  Critical care time was exclusive of separately billable procedures and treating other patients.  Critical care was necessary to treat or prevent imminent or life-threatening deterioration.  Critical care was time spent personally by me on the following activities: development of treatment plan with patient and/or surrogate as well as nursing, discussions with consultants, evaluation of patient's response to treatment, examination of patient, obtaining history from patient or surrogate, ordering and performing treatments and interventions, ordering and review of laboratory studies, ordering and review of radiographic studies, pulse oximetry and re-evaluation of patient's condition.     Elfreda Blanchet, Delice Bison, DO 02/23/18 [phone]

## 2018-02-22 NOTE — ED Triage Notes (Signed)
Per EMS, pt brought in from home. Pt had a fall this afternoon with just skin abrasions noted. Pt reports he felt "off" all day and has had an increase in falls lately. Pt had a cbg of 68 and 66 which his baseline is 170. EMS gave an amp of D10 and it went up to 200s. Family reported noticing about an hour ago pt's ams. Pt able to answer questions appropriately but it takes a while to respond, also falls asleep while trying to respond. Negative on stroke scale per ems. EMS BP 158/121, HR 110, 98% room air, ETCo2 25. Hx of sleep apnea.

## 2018-02-23 ENCOUNTER — Observation Stay (HOSPITAL_COMMUNITY): Payer: Medicare Other

## 2018-02-23 ENCOUNTER — Emergency Department (HOSPITAL_COMMUNITY): Payer: Medicare Other

## 2018-02-23 ENCOUNTER — Encounter (HOSPITAL_COMMUNITY): Payer: Self-pay | Admitting: Family Medicine

## 2018-02-23 DIAGNOSIS — I5042 Chronic combined systolic (congestive) and diastolic (congestive) heart failure: Secondary | ICD-10-CM

## 2018-02-23 DIAGNOSIS — E872 Acidosis, unspecified: Secondary | ICD-10-CM | POA: Diagnosis present

## 2018-02-23 DIAGNOSIS — I2510 Atherosclerotic heart disease of native coronary artery without angina pectoris: Secondary | ICD-10-CM

## 2018-02-23 DIAGNOSIS — Z794 Long term (current) use of insulin: Secondary | ICD-10-CM

## 2018-02-23 DIAGNOSIS — D638 Anemia in other chronic diseases classified elsewhere: Secondary | ICD-10-CM | POA: Diagnosis present

## 2018-02-23 DIAGNOSIS — E1165 Type 2 diabetes mellitus with hyperglycemia: Secondary | ICD-10-CM

## 2018-02-23 DIAGNOSIS — L8990 Pressure ulcer of unspecified site, unspecified stage: Secondary | ICD-10-CM

## 2018-02-23 DIAGNOSIS — N184 Chronic kidney disease, stage 4 (severe): Secondary | ICD-10-CM

## 2018-02-23 DIAGNOSIS — G9340 Encephalopathy, unspecified: Secondary | ICD-10-CM | POA: Diagnosis present

## 2018-02-23 DIAGNOSIS — N189 Chronic kidney disease, unspecified: Secondary | ICD-10-CM | POA: Diagnosis not present

## 2018-02-23 DIAGNOSIS — N179 Acute kidney failure, unspecified: Principal | ICD-10-CM

## 2018-02-23 LAB — URINALYSIS, MICROSCOPIC (REFLEX)

## 2018-02-23 LAB — COMPREHENSIVE METABOLIC PANEL
ALBUMIN: 3 g/dL — AB (ref 3.5–5.0)
ALT: 49 U/L — ABNORMAL HIGH (ref 0–44)
ALT: 52 U/L — AB (ref 0–44)
ANION GAP: 19 — AB (ref 5–15)
AST: 108 U/L — ABNORMAL HIGH (ref 15–41)
AST: 96 U/L — ABNORMAL HIGH (ref 15–41)
Albumin: 2.8 g/dL — ABNORMAL LOW (ref 3.5–5.0)
Alkaline Phosphatase: 91 U/L (ref 38–126)
Alkaline Phosphatase: 94 U/L (ref 38–126)
Anion gap: 16 — ABNORMAL HIGH (ref 5–15)
BUN: 83 mg/dL — ABNORMAL HIGH (ref 6–20)
BUN: 85 mg/dL — ABNORMAL HIGH (ref 6–20)
CO2: 16 mmol/L — ABNORMAL LOW (ref 22–32)
CO2: 18 mmol/L — ABNORMAL LOW (ref 22–32)
Calcium: 8.8 mg/dL — ABNORMAL LOW (ref 8.9–10.3)
Calcium: 8.9 mg/dL (ref 8.9–10.3)
Chloride: 104 mmol/L (ref 98–111)
Chloride: 107 mmol/L (ref 98–111)
Creatinine, Ser: 7.49 mg/dL — ABNORMAL HIGH (ref 0.61–1.24)
Creatinine, Ser: 7.58 mg/dL — ABNORMAL HIGH (ref 0.61–1.24)
GFR calc Af Amer: 9 mL/min — ABNORMAL LOW (ref >60)
GFR calc Af Amer: 9 mL/min — ABNORMAL LOW (ref >60)
GFR calc non Af Amer: 7 mL/min — ABNORMAL LOW (ref >60)
GFR calc non Af Amer: 7 mL/min — ABNORMAL LOW (ref >60)
GLUCOSE: 132 mg/dL — AB (ref 70–99)
Glucose, Bld: 122 mg/dL — ABNORMAL HIGH (ref 70–99)
Potassium: 3.7 mmol/L (ref 3.5–5.1)
Potassium: 3.8 mmol/L (ref 3.5–5.1)
Sodium: 139 mmol/L (ref 135–145)
Sodium: 141 mmol/L (ref 135–145)
Total Bilirubin: 0.9 mg/dL (ref 0.3–1.2)
Total Bilirubin: 1 mg/dL (ref 0.3–1.2)
Total Protein: 6.3 g/dL — ABNORMAL LOW (ref 6.5–8.1)
Total Protein: 6.4 g/dL — ABNORMAL LOW (ref 6.5–8.1)

## 2018-02-23 LAB — CBC WITH DIFFERENTIAL/PLATELET
Abs Immature Granulocytes: 0.04 10*3/uL (ref 0.00–0.07)
BASOS ABS: 0 10*3/uL (ref 0.0–0.1)
Basophils Relative: 0 %
Eosinophils Absolute: 0 10*3/uL (ref 0.0–0.5)
Eosinophils Relative: 0 %
HCT: 28.2 % — ABNORMAL LOW (ref 39.0–52.0)
Hemoglobin: 8.5 g/dL — ABNORMAL LOW (ref 13.0–17.0)
Immature Granulocytes: 0 %
LYMPHS PCT: 8 %
Lymphs Abs: 0.9 10*3/uL (ref 0.7–4.0)
MCH: 26.1 pg (ref 26.0–34.0)
MCHC: 30.1 g/dL (ref 30.0–36.0)
MCV: 86.5 fL (ref 80.0–100.0)
Monocytes Absolute: 1 10*3/uL (ref 0.1–1.0)
Monocytes Relative: 9 %
NRBC: 0 % (ref 0.0–0.2)
Neutro Abs: 9.2 10*3/uL — ABNORMAL HIGH (ref 1.7–7.7)
Neutrophils Relative %: 83 %
Platelets: 124 10*3/uL — ABNORMAL LOW (ref 150–400)
RBC: 3.26 MIL/uL — ABNORMAL LOW (ref 4.22–5.81)
RDW: 15.1 % (ref 11.5–15.5)
WBC: 11.2 10*3/uL — ABNORMAL HIGH (ref 4.0–10.5)

## 2018-02-23 LAB — BRAIN NATRIURETIC PEPTIDE: B Natriuretic Peptide: 272.1 pg/mL — ABNORMAL HIGH (ref 0.0–100.0)

## 2018-02-23 LAB — URINALYSIS, ROUTINE W REFLEX MICROSCOPIC
Bilirubin Urine: NEGATIVE
Glucose, UA: 250 mg/dL — AB
Ketones, ur: 15 mg/dL — AB
Leukocytes, UA: NEGATIVE
Nitrite: NEGATIVE
PH: 5.5 (ref 5.0–8.0)
Protein, ur: >300 mg/dL — AB
Specific Gravity, Urine: 1.025 (ref 1.005–1.030)

## 2018-02-23 LAB — RAPID URINE DRUG SCREEN, HOSP PERFORMED
AMPHETAMINES: NOT DETECTED
Barbiturates: NOT DETECTED
Benzodiazepines: NOT DETECTED
Cocaine: NOT DETECTED
Opiates: NOT DETECTED
Tetrahydrocannabinol: POSITIVE — AB

## 2018-02-23 LAB — GLUCOSE, CAPILLARY
GLUCOSE-CAPILLARY: 181 mg/dL — AB (ref 70–99)
Glucose-Capillary: 117 mg/dL — ABNORMAL HIGH (ref 70–99)
Glucose-Capillary: 171 mg/dL — ABNORMAL HIGH (ref 70–99)
Glucose-Capillary: 211 mg/dL — ABNORMAL HIGH (ref 70–99)

## 2018-02-23 LAB — I-STAT CG4 LACTIC ACID, ED: Lactic Acid, Venous: 1.1 mmol/L (ref 0.5–1.9)

## 2018-02-23 LAB — CREATININE, URINE, RANDOM: Creatinine, Urine: 79.87 mg/dL

## 2018-02-23 LAB — I-STAT CHEM 8, ED
BUN: 92 mg/dL — ABNORMAL HIGH (ref 6–20)
Calcium, Ion: 1.13 mmol/L — ABNORMAL LOW (ref 1.15–1.40)
Chloride: 106 mmol/L (ref 98–111)
Creatinine, Ser: 7.9 mg/dL — ABNORMAL HIGH (ref 0.61–1.24)
GLUCOSE: 126 mg/dL — AB (ref 70–99)
HCT: 25 % — ABNORMAL LOW (ref 39.0–52.0)
Hemoglobin: 8.5 g/dL — ABNORMAL LOW (ref 13.0–17.0)
Potassium: 3.7 mmol/L (ref 3.5–5.1)
Sodium: 139 mmol/L (ref 135–145)
TCO2: 20 mmol/L — ABNORMAL LOW (ref 22–32)

## 2018-02-23 LAB — AMMONIA: Ammonia: 24 umol/L (ref 9–35)

## 2018-02-23 LAB — I-STAT TROPONIN, ED: Troponin i, poc: 0.06 ng/mL (ref 0.00–0.08)

## 2018-02-23 LAB — SODIUM, URINE, RANDOM: Sodium, Ur: 47 mmol/L

## 2018-02-23 LAB — ETHANOL: Alcohol, Ethyl (B): <10 mg/dL (ref <10)

## 2018-02-23 MED ORDER — INSULIN GLARGINE 100 UNIT/ML ~~LOC~~ SOLN
10.0000 [IU] | Freq: Two times a day (BID) | SUBCUTANEOUS | Status: DC
  Administered 2018-02-23 – 2018-03-01 (×13): 10 [IU] via SUBCUTANEOUS
  Filled 2018-02-23 (×15): qty 0.1

## 2018-02-23 MED ORDER — ONDANSETRON HCL 4 MG/2ML IJ SOLN: 4.0000 mg | Freq: Four times a day (QID) | INTRAMUSCULAR | Status: DC | PRN

## 2018-02-23 MED ORDER — INSULIN ASPART 100 UNIT/ML ~~LOC~~ SOLN
0.0000 [IU] | Freq: Every day | SUBCUTANEOUS | Status: DC
  Administered 2018-02-23: 2 [IU] via SUBCUTANEOUS

## 2018-02-23 MED ORDER — FLUOXETINE HCL 20 MG PO CAPS
20.0000 mg | ORAL_CAPSULE | Freq: Every day | ORAL | Status: DC
  Administered 2018-02-23 – 2018-03-08 (×11): 20 mg via ORAL
  Filled 2018-02-23 (×12): qty 1

## 2018-02-23 MED ORDER — SODIUM CHLORIDE 0.9% FLUSH: 3.0000 mL | INTRAVENOUS | Status: DC | PRN

## 2018-02-23 MED ORDER — ACETAMINOPHEN 325 MG PO TABS
650.0000 mg | ORAL_TABLET | Freq: Four times a day (QID) | ORAL | Status: DC | PRN
  Administered 2018-02-23 – 2018-03-05 (×2): 650 mg via ORAL
  Filled 2018-02-23 (×3): qty 2

## 2018-02-23 MED ORDER — ASPIRIN EC 81 MG PO TBEC
81.0000 mg | DELAYED_RELEASE_TABLET | Freq: Every day | ORAL | Status: DC
  Administered 2018-02-23 – 2018-03-08 (×11): 81 mg via ORAL
  Filled 2018-02-23 (×12): qty 1

## 2018-02-23 MED ORDER — ACETAMINOPHEN 650 MG RE SUPP: 650.0000 mg | Freq: Four times a day (QID) | RECTAL | Status: DC | PRN

## 2018-02-23 MED ORDER — SODIUM BICARBONATE 650 MG PO TABS
650.0000 mg | ORAL_TABLET | Freq: Three times a day (TID) | ORAL | Status: DC
  Administered 2018-02-23 – 2018-02-27 (×13): 650 mg via ORAL
  Filled 2018-02-23 (×13): qty 1

## 2018-02-23 MED ORDER — TAMSULOSIN HCL 0.4 MG PO CAPS
0.4000 mg | ORAL_CAPSULE | Freq: Every day | ORAL | Status: DC
  Administered 2018-02-23 – 2018-03-08 (×11): 0.4 mg via ORAL
  Filled 2018-02-23 (×12): qty 1

## 2018-02-23 MED ORDER — HEPARIN SODIUM (PORCINE) 5000 UNIT/ML IJ SOLN
5000.0000 [IU] | Freq: Three times a day (TID) | INTRAMUSCULAR | Status: DC
  Administered 2018-02-23 – 2018-03-08 (×32): 5000 [IU] via SUBCUTANEOUS
  Filled 2018-02-23 (×35): qty 1

## 2018-02-23 MED ORDER — METOPROLOL SUCCINATE ER 25 MG PO TB24
25.0000 mg | ORAL_TABLET | Freq: Every day | ORAL | Status: DC
  Administered 2018-02-23 – 2018-03-01 (×8): 25 mg via ORAL
  Filled 2018-02-23 (×8): qty 1

## 2018-02-23 MED ORDER — ROSUVASTATIN CALCIUM 5 MG PO TABS
10.0000 mg | ORAL_TABLET | Freq: Every day | ORAL | Status: DC
  Administered 2018-02-24 – 2018-03-06 (×10): 10 mg via ORAL
  Filled 2018-02-23 (×11): qty 2

## 2018-02-23 MED ORDER — INSULIN ASPART 100 UNIT/ML ~~LOC~~ SOLN
0.0000 [IU] | Freq: Three times a day (TID) | SUBCUTANEOUS | Status: DC
  Administered 2018-02-24: 3 [IU] via SUBCUTANEOUS
  Administered 2018-02-24: 2 [IU] via SUBCUTANEOUS
  Administered 2018-02-24: 3 [IU] via SUBCUTANEOUS
  Administered 2018-02-25 (×3): 1 [IU] via SUBCUTANEOUS
  Administered 2018-02-27 (×2): 2 [IU] via SUBCUTANEOUS
  Administered 2018-02-27 – 2018-03-06 (×7): 1 [IU] via SUBCUTANEOUS
  Administered 2018-03-07: 2 [IU] via SUBCUTANEOUS
  Administered 2018-03-07: 3 [IU] via SUBCUTANEOUS
  Administered 2018-03-08: 5 [IU] via SUBCUTANEOUS
  Administered 2018-03-08: 2 [IU] via SUBCUTANEOUS

## 2018-02-23 MED ORDER — BUSPIRONE HCL 10 MG PO TABS
10.0000 mg | ORAL_TABLET | Freq: Two times a day (BID) | ORAL | Status: DC
  Administered 2018-02-23 – 2018-03-07 (×21): 10 mg via ORAL
  Filled 2018-02-23 (×27): qty 1

## 2018-02-23 MED ORDER — ONDANSETRON HCL 4 MG PO TABS: 4.0000 mg | ORAL_TABLET | Freq: Four times a day (QID) | ORAL | Status: DC | PRN

## 2018-02-23 MED ORDER — SODIUM CHLORIDE 0.9 % IV SOLN: 250.0000 mL | INTRAVENOUS | Status: DC | PRN

## 2018-02-23 MED ORDER — SODIUM CHLORIDE 0.9% FLUSH
3.0000 mL | Freq: Two times a day (BID) | INTRAVENOUS | Status: DC
  Administered 2018-02-23 – 2018-02-27 (×7): 3 mL via INTRAVENOUS

## 2018-02-23 MED ORDER — SODIUM CHLORIDE 0.9 % IV SOLN
INTRAVENOUS | Status: DC
  Administered 2018-02-23: 50 mL/h via INTRAVENOUS
  Administered 2018-02-25 – 2018-02-27 (×2): via INTRAVENOUS

## 2018-02-23 NOTE — ED Notes (Signed)
Haematologist.

## 2018-02-23 NOTE — ED Notes (Signed)
Patient transported to CT 

## 2018-02-23 NOTE — ED Notes (Signed)
XR at bedside

## 2018-02-23 NOTE — Progress Notes (Signed)
Pt transported to US

## 2018-02-23 NOTE — H&P (Addendum)
History and Physical    Cobin Cadavid [id number]:[id number] DOB: 12-17-1965 DOA: 03/04/2018  PCP: [name], MD   Patient coming from: Home   Chief Complaint: Somnolence, falls   HPI: Matthew [name] is a 52 y.o. 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  Patient reports that he was in his usual state on 02/21/2018, but has been very sleepy on 12/20, feels "out of it," and has fallen multiple times but does not believe that he hit his head or lost consciousness with this.  Denies any recent abdominal pain, vomiting, or diarrhea.  Denies headache, change in vision or hearing, or focal numbness or weakness.  Denies any recent fevers, chills, or cough.  No chest pain or palpitations.  Reports that he has not urinated much in the past day.  ED Course: Upon arrival to the ED, patient is found to be afebrile, saturating low 90s on room air, and with vitals otherwise normal.  EKG features a sinus tachycardia with rate 102, PVCs, and LAD.  Chest x-ray is notable for stable cardiomegaly and no acute cardiopulmonary disease.  Noncontrast head CT is negative for acute findings and cervical spine CT is also negative for acute injury.  Chemistry panel is notable for bicarbonate of 16, anion gap of 19, BUN of 83, and creatinine of 7.49, up from 3.1 in June.  CBC is notable for mild leukocytosis, slight thrombocytopenia, and stable normocytic anemia with hemoglobin 8.5.  Ammonia level is normal.  Arterial pH is 7.25 with normal pCO2.  Patient was given a liter of normal saline in the ED.  He remains hemodynamically stable, in no apparent respiratory distress, and will be observed for ongoing evaluation and management of acute  on chronic renal failure with encephalopathy, likely uremic.  Review of Systems:  All other systems reviewed and apart from HPI, are negative.  Past Medical History:  Diagnosis Date  . Abnormality of gait 04/08/2013  . AICD (automatic cardioverter/defibrillator) present   . Anxiety    stress  . Anxiety and depression   . CAD (coronary artery disease)    a. OOH MI 6/11; presented with acute CHF; hosp course c/b VF arrest with VDRF, etc;   b s/p CABG: L-LAD/Dx, S-OM/dCFX;  c.Nuclear scan 8/12:  Inferior and inf-lat scar with minimal peri-infarct ischemia, EF 53%.; d.LHC 9/12:  LAD 90%, pD1 (small) 70%, prox large Dx 95%, L-LAD and Dx ok, CFX occluded, S-OM and CFX ok, RCA prox to prox/mid 60-70%.  Medical management     . Cardiac arrest - ventricular fibrillation 08/26/2009  . Carpal tunnel syndrome of right wrist   . Cellulitis 2012   spider bite  . Cellulitis 06/2017   right upper extremity  . Chronic systolic heart failure ([place])   . CKD (chronic kidney disease)   . CKD stage 4 due to type 2 diabetes mellitus ([place]) 04/11/2016  . Coma ([place])   . Diabetic coma with ketoacidosis ([place])    Occured in 1994 where he spent 30 days in a coma . He develpoed infection of his leg and developing pancreatitis during that  time   . Diabetic polyneuropathy ([place]) 04/08/2013  . DM2 (diabetes mellitus, type 2) ([place])   . Dyslipidemia associated with type 2 diabetes mellitus ([place]) 09/26/2016  . Dysrhythmia   .  Foot drop, bilateral 04/08/2013  . Headache(784.0)    sinus  . History of degenerative disc disease   . HLD (hyperlipidemia)   . HTN (hypertension)   . Hypercholesteremia   . Hypertension associated with stage 3 chronic kidney disease due to type 2 diabetes mellitus ([place]) 09/26/2016  . Ischemic cardiomyopathy    echo 10/11:  inf-septal and apical HK, EF 35%, mod LAE  . Neuropathy   . Nocturnal leg cramps   . Nocturnal leg cramps 04/02/2017  . Obesity   . OSA (obstructive sleep apnea) 02/02/2012  .  Pancreatitis 2008  . Periodic limb movement 04/02/2017  . 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 Presence of permanent cardiac pacemaker   . Retinal detachment    Bilateral, laser surgery on the right  . RLS (restless legs syndrome) 04/22/2015  . Thyroid disease   . Type II diabetes mellitus with renal manifestations ([place]) 07/10/2017  . 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 Uncontrolled type 2 diabetes mellitus with diabetic neuropathy, with long-term current use of insulin ([place]) 09/26/2016  . Uncontrolled type 2 diabetes mellitus with severe nonproliferative retinopathy and macular edema, without long-term current use of insulin ([place]) 09/26/2016  . Uncontrolled type 2 diabetes mellitus with stage 4 chronic kidney disease, with long-term current use of insulin ([place]) 09/26/2016    Past Surgical History:  Procedure Laterality Date  . APPENDECTOMY  mid 1990  . CARDIAC DEFIBRILLATOR PLACEMENT    . CARDIAC SURGERY    . CARPAL TUNNEL WITH CUBITAL TUNNEL  02/22/2012   Procedure: CARPAL TUNNEL WITH CUBITAL TUNNEL;  Surgeon: Roseanne Kaufman, MD;  Location: [facility];  Service: Orthopedics;  Laterality: Right;  Right Carpal Tunnel Release/Right Cubital Tunnel Release and Transposition if Necessary with Flexor Pronator Release  . CATARACT EXTRACTION    . COLONOSCOPY WITH PROPOFOL N/A 11/17/2014   Procedure: COLONOSCOPY WITH PROPOFOL;  Surgeon: Manus Gunning, MD;  Location: [facility];  Service: Gastroenterology;  Laterality: N/A;  . COLONOSCOPY WITH PROPOFOL N/A 01/05/2015   Procedure: COLONOSCOPY WITH PROPOFOL;  Surgeon: Manus Gunning, MD;  Location: [facility];  Service: Gastroenterology;  Laterality: N/A;  . CORONARY ARTERY BYPASS GRAFT    . coronary artery bypass grafting x4  june 29,2011   x 4  . I&D EXTREMITY Right 06/26/2017   Procedure: IRRIGATION AND DEBRIDEMENT RIGHT FOREARM;  Surgeon: Roseanne Kaufman, MD;  Location: [facility];  Service: Orthopedics;  Laterality: Right;    . I&D EXTREMITY Right 06/28/2017   Procedure: Repeat irrigation and debridement right forearm;  Surgeon: Roseanne Kaufman, MD;  Location: [facility];  Service: Orthopedics;  Laterality: Right;  . I&D EXTREMITY Right 06/30/2017   Procedure: Repeat irrigation and debridement right forearm and reconstruction as necessary;  Surgeon: Roseanne Kaufman, MD;  Location: [facility];  Service: Orthopedics;  Laterality: Right;  60 mins  . PACEMAKER PLACEMENT    . RETINAL DETACHMENT SURGERY Bilateral   . RETINAL DETACHMENT SURGERY Bilateral    3 months ago  . right sided abdominal cyst removal    . TONSILLECTOMY  1998  . ULNAR NERVE TRANSPOSITION Right      reports that he has quit smoking. His smoking use included cigars. His smokeless tobacco use includes snuff. He reports that he does not drink alcohol or use drugs.  Allergies  Allergen Reactions  . Ace Inhibitors Cough  . Cephalexin Other (See Comments)    Resistant to antibiotic  . Beta Adrenergic Blockers Cough    Family History  Problem Relation Age of Onset  . Allergies Mother   . Diabetes Mother   . Hypertension Mother   . Hyperlipidemia Mother   . Dementia Mother   . Colon cancer Mother   . Lung cancer Mother   . Kidney cancer Mother   . Colon polyps Mother   . Stroke Father   . Allergies Father   . Dementia Father   . Alcohol abuse Father   . Colon polyps Father   . Diabetes Sister   . Heart disease Sister   . Anxiety disorder Sister   . Cancer Maternal Grandmother        unknown type  . Schizophrenia Paternal Uncle   . Schizophrenia Cousin      Prior to Admission medications   Medication Sig Start Date End Date Taking? Authorizing Provider  acetaminophen (TYLENOL) 500 MG tablet Take 1,000 mg by mouth every 8 (eight) hours as needed for mild pain.    Yes [provider]  aspirin EC 81 MG tablet Take 81 mg by mouth daily.   Yes [provider]  baclofen (LIORESAL) 20 MG tablet Take 1 tablet (20 mg total) by  mouth 3 (three) times daily. Must be seen prior to future refills 02/06/18  Yes Kathrynn Ducking, MD  busPIRone (BUSPAR) 10 MG tablet Take 1 tablet (10 mg total) by mouth 2 (two) times daily. 11/06/17  Yes Cloria Spring, MD  ergocalciferol (VITAMIN D2) 50000 units capsule Take 1 capsule (50,000 Units total) by mouth once a week. One capsule once weekly 08/16/17  Yes [name], MD  ezetimibe (ZETIA) 10 MG tablet Take 10 mg by mouth daily.   Yes [provider]  FLUoxetine (PROZAC) 20 MG capsule Take 1 capsule (20 mg total) by mouth daily. 11/06/17 11/06/18 Yes Cloria Spring, MD  fluticasone [facility] 50 MCG/ACT nasal spray Place 2 sprays into both nostrils daily. 04/11/17  Yes [name], MD  furosemide (LASIX) 40 MG tablet Take 3 tablets (120 mg total) by mouth daily. Patient taking differently: Take 80 mg by mouth 2 (two) times daily. Takes 1 tablet twice daily --increases to 3 tablets daily with edema 11/23/17 02/23/18 Yes Evans Lance, MD  Insulin Degludec (TRESIBA) 100 UNIT/ML SOLN Inject 40 Units into the skin 2 (two) times daily.    Yes [provider]  Melatonin 10 MG TABS Take 10 mg by mouth at bedtime.   Yes [provider]  methylcellulose (ARTIFICIAL TEARS) 1 % ophthalmic solution Place 1 drop into both eyes 2 (two) times daily as needed (dry eyes).   Yes [provider]  metoprolol succinate (TOPROL-XL) 50 MG 24 hr tablet Take 1 tablet (50 mg total) by mouth daily. Patient taking differently: Take 25 mg by mouth daily.  11/09/17  Yes Baldwin Jamaica, PA-C  mirtazapine (REMERON) 30 MG tablet Take 1 tablet (30 mg total) by mouth at bedtime. 11/06/17  Yes Cloria Spring, MD  Multiple Vitamin (MULTIVITAMIN) tablet Take 1 tablet by mouth daily.     Yes [provider]  nitroGLYCERIN (NITROSTAT) 0.4 MG SL tablet Place 1 tablet (0.4 mg total) under the tongue every 5 (five) minutes as needed for chest pain. 01/21/16 01/26/20 Yes Evans Lance, MD  Propylhexedrine [facility]) INHA Place 1 each into the nose 2 (two) times daily as needed (congestion).    Yes [provider]  rosuvastatin (CRESTOR) 40 MG tablet Take 1 tablet (40 mg total) by mouth daily.  11/09/17  Yes Baldwin Jamaica, PA-C  vitamin C (VITAMIN C) 1000 MG tablet Take 1 tablet (1,000 mg total) by mouth daily. 07/04/17  Yes Roxan Hockey, MD    Physical Exam: Vitals:   02/23/18 0115 02/23/18 0200 02/23/18 0315 02/23/18 0330  BP:  (!) 126/56  124/67  Pulse: 93 99 87   Resp: 12 11    Temp:      TempSrc:      SpO2: 99% 94% 100%   Weight:      Height:        Constitutional: NAD, somnolent  Eyes: PERTLA, lids and conjunctivae normal ENMT: Mucous membranes are moist. Posterior pharynx clear of any exudate or lesions.   Neck: normal, supple, no masses, no thyromegaly Respiratory: clear to auscultation bilaterally, no wheezing, no crackles. Normal respiratory effort.    Cardiovascular: S1 & S2 heard, regular rate and rhythm. Pretibial pitting edema bilaterally. Abdomen: No distension, no tenderness, soft. Bowel sounds active.  Musculoskeletal: no clubbing / cyanosis. No joint deformity upper and lower extremities.    Skin: Superficial abrasions to bilateral LE's. Warm, dry, well-perfused. Neurologic: No facial asymmetry. Sensation intact. Asterixis. Mild global weakness. Somnolent, easily woken.   Psychiatric: Somnolent, easily roused and oriented x 3. Pleasant, coopperative.    Labs on Admission: I have personally reviewed following labs and imaging studies  CBC: Recent Labs  Lab 02/04/2018 2330 02/23/18 0003  WBC 11.2*  --   NEUTROABS 9.2*  --   HGB 8.5* 8.5*  HCT 28.2* 25.0*  MCV 86.5  --   PLT 124*  --    Basic Metabolic Panel: Recent Labs  Lab 02/15/2018 2330 02/23/18 0003  NA 139 139  K 3.7 3.7  CL 104 106  CO2 16*  --   GLUCOSE 132* 126*  BUN 83* 92*  CREATININE 7.49* 7.90*  CALCIUM 8.8*  --    GFR: Estimated Creatinine  Clearance: 13.4 mL/min (A) (by C-G formula based on SCr of 7.9 mg/dL (H)). Liver Function Tests: Recent Labs  Lab 02/15/2018 2330  AST 108*  ALT 49*  ALKPHOS 91  BILITOT 1.0  PROT 6.4*  ALBUMIN 3.0*   No results for input(s): LIPASE, AMYLASE in the last 168 hours. Recent Labs  Lab 02/23/18 0006  AMMONIA 24   Coagulation Profile: No results for input(s): INR, PROTIME in the last 168 hours. Cardiac Enzymes: No results for input(s): CKTOTAL, CKMB, CKMBINDEX, TROPONINI in the last 168 hours. BNP (last 3 results) No results for input(s): PROBNP in the last 8760 hours. HbA1C: No results for input(s): HGBA1C in the last 72 hours. CBG: Recent Labs  Lab 02/21/2018 2327  GLUCAP 120*   Lipid Profile: No results for input(s): CHOL, HDL, LDLCALC, TRIG, CHOLHDL, LDLDIRECT in the last 72 hours. Thyroid Function Tests: No results for input(s): TSH, T4TOTAL, FREET4, T3FREE, THYROIDAB in the last 72 hours. Anemia Panel: No results for input(s): VITAMINB12, FOLATE, FERRITIN, TIBC, IRON, RETICCTPCT in the last 72 hours. Urine analysis:    Component Value Date/Time   COLORURINE STRAW (A) 07/10/2017 1919   APPEARANCEUR CLEAR 07/10/2017 1919   LABSPEC 1.006 07/10/2017 1919   PHURINE 6.0 07/10/2017 1919   GLUCOSEU 150 (A) 07/10/2017 1919   HGBUR SMALL (A) 07/10/2017 1919   BILIRUBINUR NEGATIVE 07/10/2017 1919   KETONESUR NEGATIVE 07/10/2017 1919   PROTEINUR 30 (A) 07/10/2017 1919   UROBILINOGEN 1.0 08/31/2009 1526   NITRITE NEGATIVE 07/10/2017 1919   LEUKOCYTESUR NEGATIVE 07/10/2017 1919   Sepsis Labs: @LABRCNTIP (procalcitonin:4,lacticidven:4) )No  results found for this or any previous visit (from the past 240 hour(s)).   Radiological Exams on Admission: Ct Head Wo Contrast  Result Date: 02/23/2018 CLINICAL DATA:  Status post fall at home, with lethargy. Concern for head or cervical spine injury. Initial encounter. EXAM: CT HEAD WITHOUT CONTRAST CT CERVICAL SPINE WITHOUT CONTRAST  TECHNIQUE: Multidetector CT imaging of the head and cervical spine was performed following the standard protocol without intravenous contrast. Multiplanar CT image reconstructions of the cervical spine were also generated. COMPARISON:  CT of the head performed 07/10/2017 FINDINGS: CT HEAD FINDINGS Brain: No evidence of acute infarction, hemorrhage, hydrocephalus, extra-axial collection or mass lesion/mass effect. The posterior fossa, including the cerebellum, brainstem and fourth ventricle, is within normal limits. The third and lateral ventricles, and basal ganglia are unremarkable in appearance. The cerebral hemispheres are symmetric in appearance, with normal gray-white differentiation. No mass effect or midline shift is seen. Vascular: No hyperdense vessel or unexpected calcification. Skull: There is no evidence of fracture; visualized osseous structures are unremarkable in appearance. Sinuses/Orbits: The visualized portions of the orbits are within normal limits. The paranasal sinuses and mastoid air cells are well-aerated. Other: No significant soft tissue abnormalities are seen. CT CERVICAL SPINE FINDINGS Alignment: Normal. Skull base and vertebrae: No acute fracture. No primary bone lesion or focal pathologic process. Soft tissues and spinal canal: No prevertebral fluid or swelling. No visible canal hematoma. Disc levels: Intervertebral disc spaces are preserved. The bony foramina are grossly unremarkable. Upper chest: The visualized portions of the thyroid gland are unremarkable. Mild calcification is noted at the carotid bifurcations bilaterally. Other: No additional soft tissue abnormalities are seen. IMPRESSION: 1. No evidence of traumatic intracranial injury or fracture. 2. No evidence of fracture or subluxation along the cervical spine. 3. Mild calcification at the carotid bifurcations bilaterally. Carotid ultrasound could be considered for further evaluation, when and as deemed clinically appropriate.  Electronically Signed   By: Garald Balding M.D.   On: 02/23/2018 01:20   Ct Cervical Spine Wo Contrast  Result Date: 02/23/2018 CLINICAL DATA:  Status post fall at home, with lethargy. Concern for head or cervical spine injury. Initial encounter. EXAM: CT HEAD WITHOUT CONTRAST CT CERVICAL SPINE WITHOUT CONTRAST TECHNIQUE: Multidetector CT imaging of the head and cervical spine was performed following the standard protocol without intravenous contrast. Multiplanar CT image reconstructions of the cervical spine were also generated. COMPARISON:  CT of the head performed 07/10/2017 FINDINGS: CT HEAD FINDINGS Brain: No evidence of acute infarction, hemorrhage, hydrocephalus, extra-axial collection or mass lesion/mass effect. The posterior fossa, including the cerebellum, brainstem and fourth ventricle, is within normal limits. The third and lateral ventricles, and basal ganglia are unremarkable in appearance. The cerebral hemispheres are symmetric in appearance, with normal gray-white differentiation. No mass effect or midline shift is seen. Vascular: No hyperdense vessel or unexpected calcification. Skull: There is no evidence of fracture; visualized osseous structures are unremarkable in appearance. Sinuses/Orbits: The visualized portions of the orbits are within normal limits. The paranasal sinuses and mastoid air cells are well-aerated. Other: No significant soft tissue abnormalities are seen. CT CERVICAL SPINE FINDINGS Alignment: Normal. Skull base and vertebrae: No acute fracture. No primary bone lesion or focal pathologic process. Soft tissues and spinal canal: No prevertebral fluid or swelling. No visible canal hematoma. Disc levels: Intervertebral disc spaces are preserved. The bony foramina are grossly unremarkable. Upper chest: The visualized portions of the thyroid gland are unremarkable. Mild calcification is noted at the carotid bifurcations bilaterally. Other:  No additional soft tissue abnormalities  are seen. IMPRESSION: 1. No evidence of traumatic intracranial injury or fracture. 2. No evidence of fracture or subluxation along the cervical spine. 3. Mild calcification at the carotid bifurcations bilaterally. Carotid ultrasound could be considered for further evaluation, when and as deemed clinically appropriate. Electronically Signed   By: Garald Balding M.D.   On: 02/23/2018 01:20   Dg Chest Portable 1 View  Result Date: 02/23/2018 CLINICAL DATA:  Mechanical fall this afternoon. EXAM: PORTABLE CHEST 1 VIEW COMPARISON:  Chest radiograph Jul 10, 2017 FINDINGS: Cardiac silhouette is mildly enlarged and unchanged. Status post median sternotomy with multiple proximal fractured sternotomy wires. LEFT AICD in situ. No pleural effusion or focal consolidation. No pneumothorax. Osseous structures are non suspicious. IMPRESSION: 1. Stable cardiomegaly.  No acute pulmonary process. Electronically Signed   By: Elon Alas M.D.   On: 02/23/2018 01:18    EKG: Independently reviewed. Sinus tachycardia (rate 102), PVC's, LAD.   Assessment/Plan   1. Acute renal failure superimposed on CKD IV   - Presents with somnolence and recurrent falls, has trouble staying awake in ED, found to have BUN 83 and SCr 7.49, up from 38 and 3.12 in June  - There is a metabolic acidosis with pH 7.25, serum bicarb 16, AG 19  - No hyperkalemia  - Appears hypervolemic without pulm edema or resp distress  - BP at goal  - Potassium and sodium normal  - He was given a liter of NS in ED  - Check renal US and urine chemistries, renally-dose medications, hold Lasix and see if improved with IVF, start bicarbonate   2. Acute encephalopathy  - Presents with somnolence and recurrent falls - No focal neurologic deficits identified and head CT negative for acute findings  - Ammonia is normal in ED  - Asterixis noted - Likely secondary to #1 (uremic encephalopathy)  - Address renal failure as above, may need dialysis    3.  Chronic combined systolic and diastolic CHF  - Appears hypervolemic, no rales or respiratory distress  - He was given a liter of NS in ED in light of his AKI  - Lasix held on admission, will see if renal function improved with IVF but likely needs diuresis  - Follow daily wt and I/O's, continue beta-blocker   4. Insulin-dependent DM  - A1c was 9.3% in April  - Managed at home with Tresiba 40 units BID  - Check CBG's, continue insulin with dose-reduction in light of worsened renal function    5. Anemia  - Hgb is stable at 8.5 with no active bleeding, likely secondary to CKD    6. Depression, anxiety  - Continue Buspar and Prozac, hold Remeron given somnolence    7. CAD - No anginal complaints  - Continue ASA, statin, and beta-blocker     DVT prophylaxis: sq heparin  Code Status: Full  Family Communication: Discussed with patient  Consults called: None Admission status: Observation    Vianne Bulls, MD [facility] Pager [phone]  If 7PM-7AM, please contact night-coverage [email or web address] Password [facility]  02/23/2018, 3:37 AM

## 2018-02-23 NOTE — ED Notes (Signed)
Patient transported to Ultrasound 

## 2018-02-23 NOTE — Progress Notes (Signed)
[facility] PROGRESS NOTE  Matthew [name] GGY:[id number] DOB: 01-23-1966 DOA: 02/26/2018 PCP: [name], MD   Narrative: 63 m Known history of CAD with cardiac arrest and V. fib status post pacemaker/AICD [employer] 08/2009 -last EF about 35%-class II heart failure symptoms Bipolar CKD stage IV-baseline BUN/creatinine in the 4-5 range HLD Diabetes mellitus TY 2 Restless leg syndrome History of cellulitis right hand and arm 06/26/2017 requiring debridement x3  Patient came to emergency room because of somnolence falls and feeling "out of it" work-up in the emergency room was inclusive of CT and chest x-ray which were negative however he had a bicarb of 16 anion gap of 19 BUN of 83 creatinine of 7.4 up from 3.1-patient was treated with IV saline   A & Plan Acute renal failure superimposed on CKD 4-FeNa is about 3 which points to intrinsic renal disease however patient also needed in and out cath-has not been taking as much Lasix as 3 tablets -holding the same start IV saline 50 cc/h and monitor-Bladder scan again and start on Flomax 0.4 mg Acute encephalopathy-2/2 renal insufficiency and buildup of psychotropic meds-holding baclofen at this time-seems resolved Chronic combined systolic diastolic heart failure-last echocardiogram 5 8 EF 45% Diabetes mellitus type 2 on insulin A1c 9.3 in April-sugars 1 17-1 20 reasonable at this time Probable anemia of renal disease-monitor seems to be within normal trend Bipolar-continue Prozac 20 daily, holding Remeron 30 at bedtime, continue BuSpar 10 twice daily CAD with cardiac arrest 2011-continue aspirin 81 daily, metoprolol XL 50 daily, Crestor 40 daily Pacemaker status post [employer] AICD-interrogated in the emergency room keep on telemetry for now BMI of 39-needs outpatient weight loss  DVT prophylaxis: lovenox  Code Status: full   Family Communication: none   Disposition Plan: inpatient pending    Ailea Rhatigan, MD  [facility] Direct contact: [phone] --Via amion app OR  --[email or web address]; password [email or web address]  7PM-7AM contact night coverage as above 02/23/2018, 7:26 AM  LOS: 0 days   Consultants:  None yet  Procedures:  m  Antimicrobials:  n  Interval history/Subjective: Awake a little jittery no distress no cp no Some chronic blurred vision  Objective:  Vitals:  Vitals:   02/23/18 0400 02/23/18 0500  BP: 135/66 (!) 157/94  Pulse:    Resp:  12  Temp:    SpO2:  100%    Exam:   awake oriene dno distress no ict some difference in pupils-can twell me date time year s1 s 2no m/r/g abd sof tnt nd obese No le edema but some bruising ROM intact smile symm neur grossly intact   I have personally reviewed the following:  DATA   Labs:   Hemoglobin down from 9.6-8.9  BUN/creatinine 92/7 0.9-80 5/7.5  AST ALT 96/52 bilirubin 0.9   Imaging studies:  Renal ultrasound shows no obstructive uropathy  Medical tests:  No  Test discussed with performing physician:  No  Decision to obtain old records:  No  Review and summation of old records:  No  Scheduled Meds: . aspirin EC  81 mg Oral Daily  . busPIRone  10 mg Oral BID  . FLUoxetine  20 mg Oral Daily  . heparin  5,000 Units Subcutaneous Q8H  . insulin aspart  0-5 Units Subcutaneous QHS  . insulin aspart  0-9 Units Subcutaneous TID WC  . insulin glargine  10 Units Subcutaneous BID  . metoprolol succinate  25 mg Oral Daily  . rosuvastatin  10 mg Oral q1800  .  sodium bicarbonate  650 mg Oral TID  . sodium chloride flush  3 mL Intravenous Q12H   Continuous Infusions: . sodium chloride      Principal Problem:   Acute renal failure superimposed on stage 4 chronic kidney disease (HCC) Active Problems:   Diabetes mellitus, insulin dependent (IDDM), uncontrolled ([place])   Depression with anxiety   Coronary atherosclerosis of native coronary artery   OSA (obstructive sleep apnea)   Acute encephalopathy    Anemia of chronic disease   Chronic combined systolic and diastolic CHF (congestive heart failure) (HCC)   Metabolic acidosis   LOS: 0 days

## 2018-02-23 NOTE — Care Management Obs Status (Signed)
[place] NOTIFICATION   Patient Details  Name: Matthew [name] MRN: [id number] Date of Birth: 1966/01/30   Medicare Observation Status Notification Given:  Yes    Zenon Mayo, RN 02/23/2018, 9:54 AM

## 2018-02-23 NOTE — ED Notes (Signed)
[employer] representative called and reports no new incidents with pacemaker.

## 2018-02-23 NOTE — Progress Notes (Signed)
#  44 Foley catheter inserted without difficulty as per MD order for urinary retention.  Patient tolerated without difficulty.

## 2018-02-24 ENCOUNTER — Encounter: Payer: Self-pay | Admitting: Gastroenterology

## 2018-02-24 DIAGNOSIS — Z6841 Body Mass Index (BMI) 40.0 and over, adult: Secondary | ICD-10-CM | POA: Diagnosis not present

## 2018-02-24 DIAGNOSIS — N184 Chronic kidney disease, stage 4 (severe): Secondary | ICD-10-CM | POA: Diagnosis not present

## 2018-02-24 DIAGNOSIS — E8770 Fluid overload, unspecified: Secondary | ICD-10-CM | POA: Diagnosis not present

## 2018-02-24 DIAGNOSIS — F23 Brief psychotic disorder: Secondary | ICD-10-CM | POA: Diagnosis not present

## 2018-02-24 DIAGNOSIS — Z66 Do not resuscitate: Secondary | ICD-10-CM | POA: Diagnosis not present

## 2018-02-24 DIAGNOSIS — Z01810 Encounter for preprocedural cardiovascular examination: Secondary | ICD-10-CM | POA: Diagnosis not present

## 2018-02-24 DIAGNOSIS — Z992 Dependence on renal dialysis: Secondary | ICD-10-CM | POA: Diagnosis not present

## 2018-02-24 DIAGNOSIS — N179 Acute kidney failure, unspecified: Secondary | ICD-10-CM | POA: Diagnosis not present

## 2018-02-24 DIAGNOSIS — R627 Adult failure to thrive: Secondary | ICD-10-CM | POA: Diagnosis present

## 2018-02-24 DIAGNOSIS — G4733 Obstructive sleep apnea (adult) (pediatric): Secondary | ICD-10-CM | POA: Diagnosis not present

## 2018-02-24 DIAGNOSIS — I5042 Chronic combined systolic (congestive) and diastolic (congestive) heart failure: Secondary | ICD-10-CM | POA: Diagnosis not present

## 2018-02-24 DIAGNOSIS — E872 Acidosis: Secondary | ICD-10-CM | POA: Diagnosis not present

## 2018-02-24 DIAGNOSIS — D696 Thrombocytopenia, unspecified: Secondary | ICD-10-CM | POA: Diagnosis present

## 2018-02-24 DIAGNOSIS — I469 Cardiac arrest, cause unspecified: Secondary | ICD-10-CM | POA: Diagnosis not present

## 2018-02-24 DIAGNOSIS — D638 Anemia in other chronic diseases classified elsewhere: Secondary | ICD-10-CM | POA: Diagnosis not present

## 2018-02-24 DIAGNOSIS — E113419 Type 2 diabetes mellitus with severe nonproliferative diabetic retinopathy with macular edema, unspecified eye: Secondary | ICD-10-CM | POA: Diagnosis present

## 2018-02-24 DIAGNOSIS — Z794 Long term (current) use of insulin: Secondary | ICD-10-CM | POA: Diagnosis not present

## 2018-02-24 DIAGNOSIS — E1142 Type 2 diabetes mellitus with diabetic polyneuropathy: Secondary | ICD-10-CM | POA: Diagnosis present

## 2018-02-24 DIAGNOSIS — J9601 Acute respiratory failure with hypoxia: Secondary | ICD-10-CM | POA: Diagnosis not present

## 2018-02-24 DIAGNOSIS — Z452 Encounter for adjustment and management of vascular access device: Secondary | ICD-10-CM | POA: Diagnosis not present

## 2018-02-24 DIAGNOSIS — N186 End stage renal disease: Secondary | ICD-10-CM | POA: Diagnosis not present

## 2018-02-24 DIAGNOSIS — Z515 Encounter for palliative care: Secondary | ICD-10-CM | POA: Diagnosis not present

## 2018-02-24 DIAGNOSIS — D631 Anemia in chronic kidney disease: Secondary | ICD-10-CM | POA: Diagnosis not present

## 2018-02-24 DIAGNOSIS — L89152 Pressure ulcer of sacral region, stage 2: Secondary | ICD-10-CM | POA: Diagnosis present

## 2018-02-24 DIAGNOSIS — N138 Other obstructive and reflux uropathy: Secondary | ICD-10-CM | POA: Diagnosis present

## 2018-02-24 DIAGNOSIS — R400 Somnolence: Secondary | ICD-10-CM | POA: Diagnosis present

## 2018-02-24 DIAGNOSIS — Z931 Gastrostomy status: Secondary | ICD-10-CM | POA: Diagnosis not present

## 2018-02-24 DIAGNOSIS — W19XXXA Unspecified fall, initial encounter: Secondary | ICD-10-CM | POA: Diagnosis present

## 2018-02-24 DIAGNOSIS — J9602 Acute respiratory failure with hypercapnia: Secondary | ICD-10-CM | POA: Diagnosis not present

## 2018-02-24 DIAGNOSIS — E119 Type 2 diabetes mellitus without complications: Secondary | ICD-10-CM | POA: Diagnosis not present

## 2018-02-24 DIAGNOSIS — I462 Cardiac arrest due to underlying cardiac condition: Secondary | ICD-10-CM | POA: Diagnosis not present

## 2018-02-24 DIAGNOSIS — G9340 Encephalopathy, unspecified: Secondary | ICD-10-CM | POA: Diagnosis not present

## 2018-02-24 DIAGNOSIS — E1122 Type 2 diabetes mellitus with diabetic chronic kidney disease: Secondary | ICD-10-CM | POA: Diagnosis not present

## 2018-02-24 DIAGNOSIS — Z8674 Personal history of sudden cardiac arrest: Secondary | ICD-10-CM | POA: Diagnosis not present

## 2018-02-24 DIAGNOSIS — Z4682 Encounter for fitting and adjustment of non-vascular catheter: Secondary | ICD-10-CM | POA: Diagnosis not present

## 2018-02-24 DIAGNOSIS — G92 Toxic encephalopathy: Secondary | ICD-10-CM | POA: Diagnosis not present

## 2018-02-24 DIAGNOSIS — I472 Ventricular tachycardia: Secondary | ICD-10-CM | POA: Diagnosis not present

## 2018-02-24 DIAGNOSIS — R0602 Shortness of breath: Secondary | ICD-10-CM | POA: Diagnosis not present

## 2018-02-24 DIAGNOSIS — E1165 Type 2 diabetes mellitus with hyperglycemia: Secondary | ICD-10-CM | POA: Diagnosis not present

## 2018-02-24 DIAGNOSIS — F418 Other specified anxiety disorders: Secondary | ICD-10-CM | POA: Diagnosis not present

## 2018-02-24 DIAGNOSIS — I132 Hypertensive heart and chronic kidney disease with heart failure and with stage 5 chronic kidney disease, or end stage renal disease: Secondary | ICD-10-CM | POA: Diagnosis not present

## 2018-02-24 LAB — CBC WITH DIFFERENTIAL/PLATELET
Abs Immature Granulocytes: 0.03 10*3/uL (ref 0.00–0.07)
Basophils Absolute: 0 10*3/uL (ref 0.0–0.1)
Basophils Relative: 0 %
Eosinophils Absolute: 0.1 10*3/uL (ref 0.0–0.5)
Eosinophils Relative: 2 %
HCT: 25.2 % — ABNORMAL LOW (ref 39.0–52.0)
HEMOGLOBIN: 7.8 g/dL — AB (ref 13.0–17.0)
Immature Granulocytes: 0 %
Lymphocytes Relative: 11 %
Lymphs Abs: 0.9 10*3/uL (ref 0.7–4.0)
MCH: 26.7 pg (ref 26.0–34.0)
MCHC: 31 g/dL (ref 30.0–36.0)
MCV: 86.3 fL (ref 80.0–100.0)
MONO ABS: 0.8 10*3/uL (ref 0.1–1.0)
MONOS PCT: 10 %
Neutro Abs: 6.4 10*3/uL (ref 1.7–7.7)
Neutrophils Relative %: 77 %
Platelets: 117 10*3/uL — ABNORMAL LOW (ref 150–400)
RBC: 2.92 MIL/uL — ABNORMAL LOW (ref 4.22–5.81)
RDW: 15.3 % (ref 11.5–15.5)
WBC: 8.3 10*3/uL (ref 4.0–10.5)
nRBC: 0 % (ref 0.0–0.2)

## 2018-02-24 LAB — RENAL FUNCTION PANEL
Albumin: 2.4 g/dL — ABNORMAL LOW (ref 3.5–5.0)
Anion gap: 17 — ABNORMAL HIGH (ref 5–15)
BUN: 96 mg/dL — ABNORMAL HIGH (ref 6–20)
CO2: 16 mmol/L — AB (ref 22–32)
Calcium: 8.3 mg/dL — ABNORMAL LOW (ref 8.9–10.3)
Chloride: 105 mmol/L (ref 98–111)
Creatinine, Ser: 7.74 mg/dL — ABNORMAL HIGH (ref 0.61–1.24)
GFR calc Af Amer: 8 mL/min — ABNORMAL LOW (ref >60)
GFR calc non Af Amer: 7 mL/min — ABNORMAL LOW (ref >60)
GLUCOSE: 168 mg/dL — AB (ref 70–99)
Phosphorus: 9.6 mg/dL — ABNORMAL HIGH (ref 2.5–4.6)
Potassium: 3.6 mmol/L (ref 3.5–5.1)
Sodium: 138 mmol/L (ref 135–145)

## 2018-02-24 LAB — FOLATE: Folate: 26.1 ng/mL (ref >5.9)

## 2018-02-24 LAB — RETICULOCYTES
Immature Retic Fract: 20.1 % — ABNORMAL HIGH (ref 2.3–15.9)
RBC.: 2.84 MIL/uL — ABNORMAL LOW (ref 4.22–5.81)
Retic Count, Absolute: 47.7 10*3/uL (ref 19.0–186.0)
Retic Ct Pct: 1.7 % (ref 0.4–3.1)

## 2018-02-24 LAB — IRON AND TIBC
IRON: 33 ug/dL — AB (ref 45–182)
Saturation Ratios: 16 % — ABNORMAL LOW (ref 17.9–39.5)
TIBC: 207 ug/dL — ABNORMAL LOW (ref 250–450)
UIBC: 174 ug/dL

## 2018-02-24 LAB — FERRITIN: Ferritin: 110 ng/mL (ref 24–336)

## 2018-02-24 LAB — GLUCOSE, CAPILLARY
Glucose-Capillary: 173 mg/dL — ABNORMAL HIGH (ref 70–99)
Glucose-Capillary: 205 mg/dL — ABNORMAL HIGH (ref 70–99)
Glucose-Capillary: 213 mg/dL — ABNORMAL HIGH (ref 70–99)
Glucose-Capillary: 173 mg/dL — ABNORMAL HIGH (ref 70–99)

## 2018-02-24 LAB — VITAMIN B12: Vitamin B-12: 1135 pg/mL — ABNORMAL HIGH (ref 180–914)

## 2018-02-24 LAB — UREA NITROGEN, URINE: Urea Nitrogen, Ur: 502 mg/dL

## 2018-02-24 MED ORDER — DARBEPOETIN ALFA 150 MCG/0.3ML IJ SOSY
150.0000 ug | PREFILLED_SYRINGE | INTRAMUSCULAR | Status: DC
  Filled 2018-02-24 (×2): qty 0.3

## 2018-02-24 MED ORDER — SEVELAMER CARBONATE 800 MG PO TABS
800.0000 mg | ORAL_TABLET | Freq: Three times a day (TID) | ORAL | Status: DC
  Administered 2018-02-24: 800 mg via ORAL
  Filled 2018-02-24 (×2): qty 1

## 2018-02-24 MED ORDER — SODIUM CHLORIDE 0.9 % IV SOLN
250.0000 mg | Freq: Every day | INTRAVENOUS | Status: AC
  Administered 2018-02-24 – 2018-02-27 (×2): 250 mg via INTRAVENOUS
  Filled 2018-02-24 (×5): qty 20

## 2018-02-24 MED ORDER — CALCITRIOL 0.5 MCG PO CAPS
0.5000 ug | ORAL_CAPSULE | Freq: Every day | ORAL | Status: DC
  Administered 2018-02-24 – 2018-03-01 (×6): 0.5 ug via ORAL
  Filled 2018-02-24: qty 2
  Filled 2018-02-24 (×4): qty 1

## 2018-02-24 MED ORDER — SALINE SPRAY 0.65 % NA SOLN
1.0000 | NASAL | Status: DC | PRN
  Administered 2018-02-24 – 2018-02-26 (×2): 1 via NASAL
  Filled 2018-02-24: qty 44

## 2018-02-24 MED ORDER — CALCIUM ACETATE (PHOS BINDER) 667 MG PO CAPS
1334.0000 mg | ORAL_CAPSULE | Freq: Three times a day (TID) | ORAL | Status: DC
  Administered 2018-02-24 – 2018-02-26 (×5): 1334 mg via ORAL
  Filled 2018-02-24 (×5): qty 2

## 2018-02-24 NOTE — Progress Notes (Signed)
Pt placed on CPAP with 4lpm oxygen as settings were for nasal cannula.  Pt tolerating well at time of placement.

## 2018-02-24 NOTE — Progress Notes (Signed)
[facility] PROGRESS NOTE  Matthew [name] HUT:[id number] DOB: December 23, 1965 DOA: 02/26/2018 PCP: [name], MD   Narrative: 29 m Known history of CAD with cardiac arrest and V. fib status post pacemaker/AICD [employer] 08/2009 -last EF about 35%-class II heart failure symptoms Bipolar CKD stage IV-baseline BUN/creatinine in the 4-5 range HLD Diabetes mellitus TY 2 Restless leg syndrome History of cellulitis right hand and arm 06/26/2017 requiring debridement x3  Patient came to emergency room because of somnolence falls and feeling "out of it" work-up in the emergency room was inclusive of CT and chest x-ray which were negative however he had a bicarb of 16 anion gap of 19 BUN of 83 creatinine of 7.4 up from 3.1-patient was treated with IV saline   A & Plan Acute renal failure superimposed on CKD 4-FeNa is about 3 which points to intrinsic renal disease-continue Flomax 0.4 mg-no improvement with indwelling Foley suspect may need dialysis-on bicarb 650 tid daily for metabolic acidosis from renal insufficiency-adding Renvela 800 3 times daily for elevated phosphorus-repeat labs a.m. and await input from renal physician appreciated in advance  Acute encephalopathy-2/2 renal insufficiency and buildup of psychotropic meds-holding baclofen-cut back gabapentin as well at this time-seems resolved and is much less shaky today  Chronic combined systolic diastolic heart failure-last echocardiogram 58 EF 45%  Diabetes mellitus type 2 on insulin A1c 9.3 in April-sugars currently 111  Probable anemia of renal disease-slightly lower hemoglobin now currently 7.8 we will check renal panel he is not having any dark or tarry stools  Bipolar-continue Prozac 20 daily, holding Remeron 30 at bedtime, continue BuSpar 10 twice daily  CAD with cardiac arrest 2011-continue aspirin 81 daily, metoprolol XL 50 daily, Crestor 40 daily  Pacemaker status post [employer] AICD-interrogated in  the emergency room keep on telemetry for now  BMI of 39-needs outpatient weight loss  DVT prophylaxis: lovenox  Code Status: full   Family Communication: none   Disposition Plan: inpatient pending    [name], MD  [facility] Direct contact: [phone] --Via amion app OR  --[email or web address]; password [email or web address]  7PM-7AM contact night coverage as above 02/24/2018, 11:06 AM  LOS: 0 days   Consultants:  None yet  Procedures:  m  Antimicrobials:  n  Interval history/Subjective:  -Less jittery seems at baseline no distress eating drinking passing good urine  Objective:  Vitals:  Vitals:   02/24/18 0039 02/24/18 0804  BP:  137/72  Pulse: 68 70  Resp: 18 20  Temp:    SpO2: 99% 97%    Exam:  Oriented alert no distress NCAT EOMI s1 s 2no m/r/g abd sof tnt nd obese No le edema but some bruising to lower extremities ROM intact smile symm neur grossly intact   I have personally reviewed the following:  DATA   Labs:   Hemoglobin down from 9.6-8.9-->7.8  BUN/creatinine about the same at 96/7.7 bicarb 16  Phosphorus 9.6   Imaging studies:  Renal ultrasound shows no obstructive uropathy  Medical tests:  No  Test discussed with performing physician:  No  Decision to obtain old records:  No  Review and summation of old records:  No  Scheduled Meds: . aspirin EC  81 mg Oral Daily  . busPIRone  10 mg Oral BID  . FLUoxetine  20 mg Oral Daily  . heparin  5,000 Units Subcutaneous Q8H  . insulin aspart  0-5 Units Subcutaneous QHS  . insulin aspart  0-9 Units Subcutaneous TID WC  . insulin glargine  10 Units Subcutaneous BID  . metoprolol succinate  25 mg Oral Daily  . rosuvastatin  10 mg Oral q1800  . sodium bicarbonate  650 mg Oral TID  . sodium chloride flush  3 mL Intravenous Q12H  . tamsulosin  0.4 mg Oral Daily   Continuous Infusions: . sodium chloride    . sodium chloride 50 mL/hr at 02/24/18 0215    Principal Problem:   Acute renal  failure superimposed on stage 4 chronic kidney disease ([place]) Active Problems:   Diabetes mellitus, insulin dependent (IDDM), uncontrolled ([place])   Depression with anxiety   Coronary atherosclerosis of native coronary artery   OSA (obstructive sleep apnea)   Acute encephalopathy   Anemia of chronic disease   Chronic combined systolic and diastolic CHF (congestive heart failure) (HCC)   Metabolic acidosis   Pressure injury of skin   LOS: 0 days

## 2018-02-24 NOTE — Consult Note (Signed)
[facility] Renal Consultation Note  Requesting MD: Samtani Indication for Consultation: worsening CKD  HPI:  Matthew [name] is a 52 y.o. male with past medical history significant for type 2 diabetes mellitus, hypertension and hyperlipidemia, noted bipolar disorder as well as coronary artery disease with cardiac arrest in the past, status post ICD with an ejection fraction of 35%.  He also has advanced CKD followed by Dr. Jolinda Croak at [facility]- seem in October crt was 4.3 indicating a GFR of 15 which is not that different from where it had been in April 2019.  He did not immediately recall the name of his nephrologist.  It does not appear that he has been educated to much extent regarding dialysis.  He presented to medical attention on 12/20 just feeling poorly-was out of it and following and had some urinary retention.  Labs showing creatinine of 7.4 with BUN of 83.  He has been in the hospital since then, receiving fluids and Foley catheter drainage but numbers have not improved, albumin is 2.4.  He says that he feels better.  He denies a significant appetite disturbance at this time, he does admit to fatigue and muscle cramping but denies itching and says his mental status is much improved from admission.  He lives with his elderly father with dementia and is his father's primary caregiver  Creat  Date/Time Value Ref Range Status  08/15/2017 02:19 PM 3.12 (H) 0.70 - 1.33 mg/dL Final    Comment:    For patients >32 years of age, the reference limit for Creatinine is approximately 13% higher for people identified as African-American. .   04/23/2017 11:26 AM 2.83 (H) 0.70 - 1.33 mg/dL Final    Comment:    For patients >81 years of age, the reference limit for Creatinine is approximately 13% higher for people identified as African-American. Marland Kitchen   03/30/2017 11:19 AM 3.09 (H) 0.70 - 1.33 mg/dL Final    Comment:    Verified by repeat analysis. . For patients >49 years of age, the  reference limit for Creatinine is approximately 13% higher for people identified as African-American. .   09/20/2016 12:44 PM 2.41 (H) 0.70 - 1.33 mg/dL Final    Comment:      For patients > or = 54 years of age: The upper reference limit for Creatinine is approximately 13% higher for people identified as African-American.     10/01/2014 12:17 PM 1.68 (H) 0.70 - 1.33 mg/dL Final  10/02/2012 03:35 PM 1.37 (H) 0.50 - 1.35 mg/dL Final  05/11/2011 04:28 PM 1.51 (H) 0.50 - 1.35 mg/dL Final  11/18/2010 03:07 PM 1.45 (H) 0.50 - 1.35 mg/dL Final   Creatinine, Ser  Date/Time Value Ref Range Status  02/24/2018 02:59 AM 7.74 (H) 0.61 - 1.24 mg/dL Final  02/23/2018 05:54 AM 7.58 (H) 0.61 - 1.24 mg/dL Final  02/23/2018 12:03 AM 7.90 (H) 0.61 - 1.24 mg/dL Final  02/13/2018 11:30 PM 7.49 (H) 0.61 - 1.24 mg/dL Final  07/12/2017 04:50 AM 4.54 (H) 0.61 - 1.24 mg/dL Final  07/11/2017 01:26 PM 4.39 (H) 0.61 - 1.24 mg/dL Final  07/10/2017 11:23 PM 4.37 (H) 0.61 - 1.24 mg/dL Final  07/10/2017 11:36 AM 4.41 (H) 0.61 - 1.24 mg/dL Final  07/03/2017 03:02 AM 5.13 (H) 0.61 - 1.24 mg/dL Final  07/02/2017 06:34 AM 5.36 (H) 0.61 - 1.24 mg/dL Final  07/01/2017 05:30 AM 5.56 (H) 0.61 - 1.24 mg/dL Final  06/30/2017 09:01 AM 5.49 (H) 0.61 - 1.24 mg/dL Final  06/29/2017  06:57 AM 5.07 (H) 0.61 - 1.24 mg/dL Final  06/28/2017 06:11 AM 4.59 (H) 0.61 - 1.24 mg/dL Final  06/27/2017 05:58 AM 4.39 (H) 0.61 - 1.24 mg/dL Final  06/26/2017 10:03 AM 4.53 (H) 0.61 - 1.24 mg/dL Final  06/25/2017 09:04 PM 4.54 (H) 0.61 - 1.24 mg/dL Final  09/17/2012 02:43 AM 1.36 (H) 0.50 - 1.35 mg/dL Final  09/16/2012 12:10 PM 1.22 0.50 - 1.35 mg/dL Final  02/19/2012 04:19 PM 1.46 (H) 0.50 - 1.35 mg/dL Final  10/30/2011 01:13 PM 1.20 0.50 - 1.35 mg/dL Final  07/13/2011 12:21 PM 1.3 0.4 - 1.5 mg/dL Final  02/13/2011 02:52 PM 1.4 0.4 - 1.5 mg/dL Final  01/04/2011 02:41 PM 1.5 0.4 - 1.5 mg/dL Final  11/01/2010 12:52 PM 1.2 0.4 - 1.5 mg/dL  Final  09/12/2010 03:32 PM 1.28 0.50 - 1.35 mg/dL Final    Comment:    **Please note change in reference range.**  11/25/2009 10:48 AM 1.2 0.4 - 1.5 mg/dL Final  09/23/2009 03:20 PM 1.4 0.4 - 1.5 mg/dL Final  09/09/2009 04:07 AM 1.53 (H) 0.4 - 1.5 mg/dL Final  09/08/2009 04:14 AM 1.61 (H) 0.4 - 1.5 mg/dL Final  09/07/2009 04:35 AM 1.87 (H) 0.4 - 1.5 mg/dL Final  09/06/2009 05:15 AM 2.11 (H) 0.4 - 1.5 mg/dL Final  09/05/2009 03:25 AM 2.49 (H) 0.4 - 1.5 mg/dL Final  09/04/2009 04:15 AM 2.43 (H) 0.4 - 1.5 mg/dL Final  09/03/2009 04:25 PM 2.40 (H) 0.4 - 1.5 mg/dL Final  09/03/2009 04:30 AM 1.92 (H) 0.4 - 1.5 mg/dL Final  09/02/2009 03:00 AM 1.67 (H) 0.4 - 1.5 mg/dL Final  09/01/2009 09:15 PM 1.5 0.4 - 1.5 mg/dL Final  09/01/2009 09:00 PM 1.52 (H) 0.4 - 1.5 mg/dL Final  09/01/2009 05:35 AM 1.65 (H) 0.4 - 1.5 mg/dL Final  08/31/2009 04:30 AM 1.39 0.4 - 1.5 mg/dL Final  08/30/2009 02:05 PM 1.37 0.4 - 1.5 mg/dL Final  08/30/2009 04:06 AM 1.16 0.4 - 1.5 mg/dL Final  08/29/2009 03:30 AM 1.27 0.4 - 1.5 mg/dL Final  08/28/2009 10:25 AM 1.36 0.4 - 1.5 mg/dL Final  08/28/2009 06:45 AM 1.11 0.4 - 1.5 mg/dL Final  08/28/2009 03:19 AM 1.19 0.4 - 1.5 mg/dL Final  08/28/2009 01:05 AM 1.01 0.4 - 1.5 mg/dL Final  08/27/2009 10:20 PM 0.95 0.4 - 1.5 mg/dL Final  08/27/2009 06:24 PM 0.93 0.4 - 1.5 mg/dL Final  08/27/2009 02:30 PM 0.95 0.4 - 1.5 mg/dL Final  08/27/2009 10:35 AM 0.91 0.4 - 1.5 mg/dL Final     PMHx:   Past Medical History:  Diagnosis Date  . Abnormality of gait 04/08/2013  . AICD (automatic cardioverter/defibrillator) present   . Anxiety    stress  . Anxiety and depression   . CAD (coronary artery disease)    a. OOH MI 6/11; presented with acute CHF; hosp course c/b VF arrest with VDRF, etc;   b s/p CABG: L-LAD/Dx, S-OM/dCFX;  c.Nuclear scan 8/12:  Inferior and inf-lat scar with minimal peri-infarct ischemia, EF 53%.; d.LHC 9/12:  LAD 90%, pD1 (small) 70%, prox large Dx 95%, L-LAD and  Dx ok, CFX occluded, S-OM and CFX ok, RCA prox to prox/mid 60-70%.  Medical management     . Cardiac arrest - ventricular fibrillation 08/26/2009  . Carpal tunnel syndrome of right wrist   . Cellulitis 2012   spider bite  . Cellulitis 06/2017   right upper extremity  . Chronic systolic heart failure ([place])   . CKD (chronic kidney disease)   .  CKD stage 4 due to type 2 diabetes mellitus ([place]) 04/11/2016  . Coma ([place])   . Diabetic coma with ketoacidosis ([place])    Occured in 1994 where he spent 30 days in a coma . He develpoed infection of his leg and developing pancreatitis during that  time   . Diabetic polyneuropathy ([place]) 04/08/2013  . DM2 (diabetes mellitus, type 2) ([place])   . Dyslipidemia associated with type 2 diabetes mellitus ([place]) 09/26/2016  . Dysrhythmia   . Foot drop, bilateral 04/08/2013  . Headache(784.0)    sinus  . History of degenerative disc disease   . HLD (hyperlipidemia)   . HTN (hypertension)   . Hypercholesteremia   . Hypertension associated with stage 3 chronic kidney disease due to type 2 diabetes mellitus ([place]) 09/26/2016  . Ischemic cardiomyopathy    echo 10/11:  inf-septal and apical HK, EF 35%, mod LAE  . Neuropathy   . Nocturnal leg cramps   . Nocturnal leg cramps 04/02/2017  . Obesity   . OSA (obstructive sleep apnea) 02/02/2012  . Pancreatitis 2008  . Periodic limb movement 04/02/2017  . 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 Presence of permanent cardiac pacemaker   . Retinal detachment    Bilateral, laser surgery on the right  . RLS (restless legs syndrome) 04/22/2015  . Thyroid disease   . Type II diabetes mellitus with renal manifestations ([place]) 07/10/2017  . 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 Uncontrolled type 2 diabetes mellitus with diabetic neuropathy, with long-term current use of insulin ([place]) 09/26/2016  . Uncontrolled type 2 diabetes mellitus with severe nonproliferative retinopathy and macular edema, without long-term current use of  insulin ([place]) 09/26/2016  . Uncontrolled type 2 diabetes mellitus with stage 4 chronic kidney disease, with long-term current use of insulin ([place]) 09/26/2016    Past Surgical History:  Procedure Laterality Date  . APPENDECTOMY  mid 1990  . CARDIAC DEFIBRILLATOR PLACEMENT    . CARDIAC SURGERY    . CARPAL TUNNEL WITH CUBITAL TUNNEL  02/22/2012   Procedure: CARPAL TUNNEL WITH CUBITAL TUNNEL;  Surgeon: Roseanne Kaufman, MD;  Location: [place];  Service: Orthopedics;  Laterality: Right;  Right Carpal Tunnel Release/Right Cubital Tunnel Release and Transposition if Necessary with Flexor Pronator Release  . CATARACT EXTRACTION    . COLONOSCOPY WITH PROPOFOL N/A 11/17/2014   Procedure: COLONOSCOPY WITH PROPOFOL;  Surgeon: Manus Gunning, MD;  Location: [facility];  Service: Gastroenterology;  Laterality: N/A;  . COLONOSCOPY WITH PROPOFOL N/A 01/05/2015   Procedure: COLONOSCOPY WITH PROPOFOL;  Surgeon: Manus Gunning, MD;  Location: [facility];  Service: Gastroenterology;  Laterality: N/A;  . CORONARY ARTERY BYPASS GRAFT    . coronary artery bypass grafting x4  june 29,2011   x 4  . I&D EXTREMITY Right 06/26/2017   Procedure: IRRIGATION AND DEBRIDEMENT RIGHT FOREARM;  Surgeon: Roseanne Kaufman, MD;  Location: [place];  Service: Orthopedics;  Laterality: Right;  . I&D EXTREMITY Right 06/28/2017   Procedure: Repeat irrigation and debridement right forearm;  Surgeon: Roseanne Kaufman, MD;  Location: [place];  Service: Orthopedics;  Laterality: Right;  . I&D EXTREMITY Right 06/30/2017   Procedure: Repeat irrigation and debridement right forearm and reconstruction as necessary;  Surgeon: Roseanne Kaufman, MD;  Location: [place];  Service: Orthopedics;  Laterality: Right;  60 mins  . PACEMAKER PLACEMENT    . RETINAL DETACHMENT SURGERY Bilateral   . RETINAL DETACHMENT SURGERY Bilateral    3 months ago  . right sided  abdominal cyst removal    . TONSILLECTOMY  1998  . ULNAR NERVE TRANSPOSITION Right      Family Hx:  Family History  Problem Relation Age of Onset  . Allergies Mother   . Diabetes Mother   . Hypertension Mother   . Hyperlipidemia Mother   . Dementia Mother   . Colon cancer Mother   . Lung cancer Mother   . Kidney cancer Mother   . Colon polyps Mother   . Stroke Father   . Allergies Father   . Dementia Father   . Alcohol abuse Father   . Colon polyps Father   . Diabetes Sister   . Heart disease Sister   . Anxiety disorder Sister   . Cancer Maternal Grandmother        unknown type  . Schizophrenia Paternal Uncle   . Schizophrenia Cousin     Social History:  reports that he has quit smoking. His smoking use included cigars. His smokeless tobacco use includes snuff. He reports that he does not drink alcohol or use drugs.  Allergies:  Allergies  Allergen Reactions  . Ace Inhibitors Cough  . Cephalexin Other (See Comments)    Resistant to antibiotic  . Beta Adrenergic Blockers Cough    Medications: Prior to Admission medications   Medication Sig Start Date End Date Taking? Authorizing Provider  acetaminophen (TYLENOL) 500 MG tablet Take 1,000 mg by mouth every 8 (eight) hours as needed for mild pain.    Yes [provider]  aspirin EC 81 MG tablet Take 81 mg by mouth daily.   Yes [provider]  baclofen (LIORESAL) 20 MG tablet Take 1 tablet (20 mg total) by mouth 3 (three) times daily. Must be seen prior to future refills 02/06/18  Yes Kathrynn Ducking, MD  busPIRone (BUSPAR) 10 MG tablet Take 1 tablet (10 mg total) by mouth 2 (two) times daily. 11/06/17  Yes Cloria Spring, MD  ergocalciferol (VITAMIN D2) 50000 units capsule Take 1 capsule (50,000 Units total) by mouth once a week. One capsule once weekly 08/16/17  Yes [name], MD  ezetimibe (ZETIA) 10 MG tablet Take 10 mg by mouth daily.   Yes [provider]  FLUoxetine (PROZAC) 20 MG capsule Take 1 capsule (20 mg total) by mouth daily. 11/06/17 11/06/18 Yes Cloria Spring, MD  fluticasone [facility]) 50 MCG/ACT nasal spray Place 2 sprays into both nostrils daily. 04/11/17  Yes [name], MD  furosemide (LASIX) 40 MG tablet Take 3 tablets (120 mg total) by mouth daily. Patient taking differently: Take 80 mg by mouth 2 (two) times daily. Takes 1 tablet twice daily --increases to 3 tablets daily with edema 11/23/17 02/23/18 Yes Evans Lance, MD  Insulin Degludec (TRESIBA) 100 UNIT/ML SOLN Inject 40 Units into the skin 2 (two) times daily.    Yes [provider]  Melatonin 10 MG TABS Take 10 mg by mouth at bedtime.   Yes [provider]  methylcellulose (ARTIFICIAL TEARS) 1 % ophthalmic solution Place 1 drop into both eyes 2 (two) times daily as needed (dry eyes).   Yes [provider]  metoprolol succinate (TOPROL-XL) 50 MG 24 hr tablet Take 1 tablet (50 mg total) by mouth daily. Patient taking differently: Take 25 mg by mouth daily.  11/09/17  Yes Baldwin Jamaica, PA-C  mirtazapine (REMERON) 30 MG tablet Take 1 tablet (30 mg total) by mouth at bedtime. 11/06/17  Yes Cloria Spring, MD  Multiple Vitamin (MULTIVITAMIN) tablet Take 1 tablet by mouth daily.     Yes [provider]  nitroGLYCERIN (NITROSTAT) 0.4 MG SL tablet Place 1 tablet (0.4 mg total) under the tongue every 5 (five) minutes as needed for chest pain. 01/21/16 01/26/20 Yes Evans Lance, MD  Propylhexedrine [facility]) INHA Place 1 each into the nose 2 (two) times daily as needed (congestion).    Yes [provider]  rosuvastatin (CRESTOR) 40 MG tablet Take 1 tablet (40 mg total) by mouth daily. 11/09/17  Yes Baldwin Jamaica, PA-C  vitamin C (VITAMIN C) 1000 MG tablet Take 1 tablet (1,000 mg total) by mouth daily. 07/04/17  Yes Roxan Hockey, MD    I have reviewed the patient's current medications.  Labs:  Results for orders placed or performed during the hospital encounter of 02/21/2018 (from the past 48 hour(s))  CBG monitoring, ED      Status: Abnormal   Collection Time: 02/09/2018 11:27 PM  Result Value Ref Range   Glucose-Capillary 120 (H) 70 - 99 mg/dL  CBC with Differential     Status: Abnormal   Collection Time: 02/12/2018 11:30 PM  Result Value Ref Range   WBC 11.2 (H) 4.0 - 10.5 K/uL   RBC 3.26 (L) 4.22 - 5.81 MIL/uL   Hemoglobin 8.5 (L) 13.0 - 17.0 g/dL   HCT 28.2 (L) 39.0 - 52.0 %   MCV 86.5 80.0 - 100.0 fL   MCH 26.1 26.0 - 34.0 pg   MCHC 30.1 30.0 - 36.0 g/dL   RDW 15.1 11.5 - 15.5 %   Platelets 124 (L) 150 - 400 K/uL   nRBC 0.0 0.0 - 0.2 %   Neutrophils Relative % 83 %   Neutro Abs 9.2 (H) 1.7 - 7.7 K/uL   Lymphocytes Relative 8 %   Lymphs Abs 0.9 0.7 - 4.0 K/uL   Monocytes Relative 9 %   Monocytes Absolute 1.0 0.1 - 1.0 K/uL   Eosinophils Relative 0 %   Eosinophils Absolute 0.0 0.0 - 0.5 K/uL   Basophils Relative 0 %   Basophils Absolute 0.0 0.0 - 0.1 K/uL   Immature Granulocytes 0 %   Abs Immature Granulocytes 0.04 0.00 - 0.07 K/uL    Comment: Performed at [facility], [address], [place], [place] [address]  Comprehensive metabolic panel     Status: Abnormal   Collection Time: 02/27/2018 11:30 PM  Result Value Ref Range   Sodium 139 135 - 145 mmol/L   Potassium 3.7 3.5 - 5.1 mmol/L   Chloride 104 98 - 111 mmol/L   CO2 16 (L) 22 - 32 mmol/L   Glucose, Bld 132 (H) 70 - 99 mg/dL   BUN 83 (H) 6 - 20 mg/dL   Creatinine, Ser 7.49 (H) 0.61 - 1.24 mg/dL   Calcium 8.8 (L) 8.9 - 10.3 mg/dL   Total Protein 6.4 (L) 6.5 - 8.1 g/dL   Albumin 3.0 (L) 3.5 - 5.0 g/dL   AST 108 (H) 15 - 41 U/L   ALT 49 (H) 0 - 44 U/L   Alkaline Phosphatase 91 38 - 126 U/L   Total Bilirubin 1.0 0.3 - 1.2 mg/dL   GFR calc non Af Amer 7 (L) >60 mL/min   GFR calc Af Amer 9 (L) >60 mL/min   Anion gap 19 (H) 5 - 15    Comment: Performed at [facility], [place] [address] [place], [place] [address]  Brain natriuretic peptide  Status: Abnormal   Collection Time: 03/04/2018 11:30 PM  Result Value Ref Range   B  Natriuretic Peptide 272.1 (H) 0.0 - 100.0 pg/mL    Comment: Performed at [facility] [address], [place], [place] [address]  I-Stat arterial blood gas, ED     Status: Abnormal   Collection Time: 02/18/2018 11:56 PM  Result Value Ref Range   pH, Arterial 7.250 (L) 7.350 - 7.450   pCO2 arterial 40.9 32.0 - 48.0 mmHg   pO2, Arterial 103.0 83.0 - 108.0 mmHg   Bicarbonate 18.1 (L) 20.0 - 28.0 mmol/L   TCO2 19 (L) 22 - 32 mmol/L   O2 Saturation 97.0 %   Acid-base deficit 9.0 (H) 0.0 - 2.0 mmol/L   Patient temperature 97.7 F    Collection site RADIAL, ALLEN'S TEST ACCEPTABLE    Drawn by RT    Sample type ARTERIAL   I-stat troponin, ED     Status: None   Collection Time: 02/23/18 12:01 AM  Result Value Ref Range   Troponin i, poc 0.06 0.00 - 0.08 ng/mL   Comment 3            Comment: Due to the release kinetics of cTnI, a negative result within the first hours of the onset of symptoms does not rule out myocardial infarction with certainty. If myocardial infarction is still suspected, repeat the test at appropriate intervals.   I-stat chem 8, ed     Status: Abnormal   Collection Time: 02/23/18 12:03 AM  Result Value Ref Range   Sodium 139 135 - 145 mmol/L   Potassium 3.7 3.5 - 5.1 mmol/L   Chloride 106 98 - 111 mmol/L   BUN 92 (H) 6 - 20 mg/dL   Creatinine, Ser 7.90 (H) 0.61 - 1.24 mg/dL   Glucose, Bld 126 (H) 70 - 99 mg/dL   Calcium, Ion 1.13 (L) 1.15 - 1.40 mmol/L   TCO2 20 (L) 22 - 32 mmol/L   Hemoglobin 8.5 (L) 13.0 - 17.0 g/dL   HCT 25.0 (L) 39.0 - 52.0 %  I-Stat CG4 Lactic Acid, ED     Status: None   Collection Time: 02/23/18 12:03 AM  Result Value Ref Range   Lactic Acid, Venous 1.10 0.5 - 1.9 mmol/L  Ethanol     Status: None   Collection Time: 02/23/18 12:06 AM  Result Value Ref Range   Alcohol, Ethyl (B) <10 <10 mg/dL    Comment: (NOTE) Lowest detectable limit for serum alcohol is 10 mg/dL. For medical purposes only. Performed at [facility],  [place] [address] [place], [place] [address]   Ammonia     Status: None   Collection Time: 02/23/18 12:06 AM  Result Value Ref Range   Ammonia 24 9 - 35 umol/L    Comment: Performed at [facility], [place] [address], [place], [place] [address]  Comprehensive metabolic panel     Status: Abnormal   Collection Time: 02/23/18  5:54 AM  Result Value Ref Range   Sodium 141 135 - 145 mmol/L   Potassium 3.8 3.5 - 5.1 mmol/L   Chloride 107 98 - 111 mmol/L   CO2 18 (L) 22 - 32 mmol/L   Glucose, Bld 122 (H) 70 - 99 mg/dL   BUN 85 (H) 6 - 20 mg/dL   Creatinine, Ser 7.58 (H) 0.61 - 1.24 mg/dL   Calcium 8.9 8.9 - 10.3 mg/dL   Total Protein 6.3 (L) 6.5 - 8.1 g/dL  Albumin 2.8 (L) 3.5 - 5.0 g/dL   AST 96 (H) 15 - 41 U/L   ALT 52 (H) 0 - 44 U/L   Alkaline Phosphatase 94 38 - 126 U/L   Total Bilirubin 0.9 0.3 - 1.2 mg/dL   GFR calc non Af Amer 7 (L) >60 mL/min   GFR calc Af Amer 9 (L) >60 mL/min   Anion gap 16 (H) 5 - 15    Comment: Performed at [facility] [address] [place], [place] [address]  Urinalysis, Routine w reflex microscopic     Status: Abnormal   Collection Time: 02/23/18  6:57 AM  Result Value Ref Range   Color, Urine YELLOW YELLOW   APPearance HAZY (A) CLEAR   Specific Gravity, Urine 1.025 1.005 - 1.030   pH 5.5 5.0 - 8.0   Glucose, UA 250 (A) NEGATIVE mg/dL   Hgb urine dipstick LARGE (A) NEGATIVE   Bilirubin Urine NEGATIVE NEGATIVE   Ketones, ur 15 (A) NEGATIVE mg/dL   Protein, ur >300 (A) NEGATIVE mg/dL   Nitrite NEGATIVE NEGATIVE   Leukocytes, UA NEGATIVE NEGATIVE    Comment: Performed at [facility] [address], [place], [place] [address]  Rapid urine drug screen (hospital performed)     Status: Abnormal   Collection Time: 02/23/18  6:57 AM  Result Value Ref Range   Opiates NONE DETECTED NONE DETECTED   Cocaine NONE DETECTED NONE DETECTED   Benzodiazepines NONE DETECTED NONE DETECTED   Amphetamines NONE DETECTED NONE DETECTED    Tetrahydrocannabinol POSITIVE (A) NONE DETECTED   Barbiturates NONE DETECTED NONE DETECTED    Comment: (NOTE) DRUG SCREEN FOR MEDICAL PURPOSES ONLY.  IF CONFIRMATION IS NEEDED FOR ANY PURPOSE, NOTIFY LAB WITHIN 5 DAYS. LOWEST DETECTABLE LIMITS FOR URINE DRUG SCREEN Drug Class                     Cutoff (ng/mL) Amphetamine and metabolites    1000 Barbiturate and metabolites    200 Benzodiazepine                 462 Tricyclics and metabolites     300 Opiates and metabolites        300 Cocaine and metabolites        300 THC                            50 Performed at [facility], [place] [address] [place], [place] [address]   Sodium, urine, random     Status: None   Collection Time: 02/23/18  6:57 AM  Result Value Ref Range   Sodium, Ur 47 mmol/L    Comment: Performed at [facility] [address] [place], [place] [address]  Creatinine, urine, random     Status: None   Collection Time: 02/23/18  6:57 AM  Result Value Ref Range   Creatinine, Urine 79.87 mg/dL    Comment: Performed at [facility] [address] [place], [place] [address]  Urinalysis, Microscopic (reflex)     Status: Abnormal   Collection Time: 02/23/18  6:57 AM  Result Value Ref Range   RBC / HPF 6-10 0 - 5 RBC/hpf   WBC, UA 0-5 0 - 5 WBC/hpf   Bacteria, UA RARE (A) NONE SEEN   Squamous Epithelial / LPF 0-5 0 - 5    Comment: Performed at [facility],  [address], [place], [place] [address]  Urea nitrogen, urine     Status: None   Collection Time: 02/23/18  6:58 AM  Result Value Ref Range   Urea Nitrogen, Ur 502 Not Estab. mg/dL    Comment: (NOTE) Performed At: [facility] [place], [place] [id number] Rush Farmer MD [id number]   Glucose, capillary     Status: Abnormal   Collection Time: 02/23/18  8:28 AM  Result Value Ref Range   Glucose-Capillary 117 (H) 70 - 99 mg/dL  Glucose, capillary     Status: Abnormal   Collection Time: 02/23/18 11:38  AM  Result Value Ref Range   Glucose-Capillary 181 (H) 70 - 99 mg/dL  Glucose, capillary     Status: Abnormal   Collection Time: 02/23/18  4:09 PM  Result Value Ref Range   Glucose-Capillary 171 (H) 70 - 99 mg/dL  Glucose, capillary     Status: Abnormal   Collection Time: 02/23/18  9:19 PM  Result Value Ref Range   Glucose-Capillary 211 (H) 70 - 99 mg/dL  Renal function panel     Status: Abnormal   Collection Time: 02/24/18  2:59 AM  Result Value Ref Range   Sodium 138 135 - 145 mmol/L   Potassium 3.6 3.5 - 5.1 mmol/L   Chloride 105 98 - 111 mmol/L   CO2 16 (L) 22 - 32 mmol/L   Glucose, Bld 168 (H) 70 - 99 mg/dL   BUN 96 (H) 6 - 20 mg/dL   Creatinine, Ser 7.74 (H) 0.61 - 1.24 mg/dL   Calcium 8.3 (L) 8.9 - 10.3 mg/dL   Phosphorus 9.6 (H) 2.5 - 4.6 mg/dL   Albumin 2.4 (L) 3.5 - 5.0 g/dL   GFR calc non Af Amer 7 (L) >60 mL/min   GFR calc Af Amer 8 (L) >60 mL/min   Anion gap 17 (H) 5 - 15    Comment: Performed at [facility], [place] [address], [place], [place] [address]  CBC with Differential/Platelet     Status: Abnormal   Collection Time: 02/24/18  2:59 AM  Result Value Ref Range   WBC 8.3 4.0 - 10.5 K/uL   RBC 2.92 (L) 4.22 - 5.81 MIL/uL   Hemoglobin 7.8 (L) 13.0 - 17.0 g/dL   HCT 25.2 (L) 39.0 - 52.0 %   MCV 86.3 80.0 - 100.0 fL   MCH 26.7 26.0 - 34.0 pg   MCHC 31.0 30.0 - 36.0 g/dL   RDW 15.3 11.5 - 15.5 %   Platelets 117 (L) 150 - 400 K/uL    Comment: REPEATED TO VERIFY PLATELET COUNT CONFIRMED BY SMEAR SPECIMEN CHECKED FOR CLOTS Immature Platelet Fraction may be clinically indicated, consider ordering this additional test [id number]    nRBC 0.0 0.0 - 0.2 %   Neutrophils Relative % 77 %   Neutro Abs 6.4 1.7 - 7.7 K/uL   Lymphocytes Relative 11 %   Lymphs Abs 0.9 0.7 - 4.0 K/uL   Monocytes Relative 10 %   Monocytes Absolute 0.8 0.1 - 1.0 K/uL   Eosinophils Relative 2 %   Eosinophils Absolute 0.1 0.0 - 0.5 K/uL   Basophils Relative 0 %   Basophils Absolute  0.0 0.0 - 0.1 K/uL   Immature Granulocytes 0 %   Abs Immature Granulocytes 0.03 0.00 - 0.07 K/uL    Comment: Performed at [facility], [place] [address] [place], [place] [address]  Glucose, capillary     Status: Abnormal  Collection Time: 02/24/18  8:02 AM  Result Value Ref Range   Glucose-Capillary 173 (H) 70 - 99 mg/dL  Reticulocytes     Status: Abnormal   Collection Time: 02/24/18 11:21 AM  Result Value Ref Range   Retic Ct Pct 1.7 0.4 - 3.1 %   RBC. 2.84 (L) 4.22 - 5.81 MIL/uL   Retic Count, Absolute 47.7 19.0 - 186.0 K/uL   Immature Retic Fract 20.1 (H) 2.3 - 15.9 %    Comment: Performed at [facility] [address] [place], [place] [address]     ROS:  A comprehensive review of systems was negative except for: Constitutional: positive for fatigue Cardiovascular: positive for lower extremity edema Musculoskeletal: positive for Muscle cramping Neurological: positive for coordination problems and memory problems  Physical Exam: Vitals:   02/24/18 0039 02/24/18 0804  BP:  137/72  Pulse: 68 70  Resp: 18 20  Temp:    SpO2: 99% 97%     General: Chronically ill-appearing white male with multiple abrasions on extremities-slightly slow to process HEENT: Pupils are equal round reactive to light, extraocular motions are intact, mucous membranes are moist Neck: No appreciable JVD Heart: Regular rate and rhythm Lungs:  mostly clear Abdomen: Obese, soft, nontender Extremities: Decreased hair growth, multiple abrasions, 1+ edema Skin: Waxy with abrasions Neuro: Slightly slow to process, nothing focal  Assessment/Plan: 52 year old chronically ill white male with progressive chronic kidney disease likely secondary to diabetes.  GFR is been in the teens for the last 6 months 1.Renal- acute on chronic kidney disease versus just progression of advanced kidney disease.  If the acute component was simply from bladder outlet obstruction I would have expected numbers to  improve with Foley catheter drainage by this point.  I am concerned that this is just progression of CKD.  He has no acute indications for dialysis but what I suspect is that he has chronic indications and this failure to thrive has a lot to do with uremia.  I did my best to explain this to him and also went over the basics of dialysis.  I feel the best way to get him better is to initiate dialysis at this time.  He does not want to initiate dialysis any sooner than he absolutely needs to any seems to be telling me that he feels better in an effort to not have to start it at this time.  Discussions will need to be ongoing-I personally think he will not thrive and return if we do not start dialysis this hospitalization 2. Hypertension/volume  -is volume overloaded but not to a significant degree-is making urine.  Blood pressure controlled on Toprol only 3.  Bladder outlet obstruction-still has Foley catheter in place. 4. Anemia  -has significant anemia, likely from CKD-iron stores are low will replete and also give him a dose of ESA 5.  Bones-phosphorus 9.6-there is an intact PTH in the system from April that is 875-he is not on active vitamin D, will start-and start a binder as well 6. Metabolic acidosis- continue bicarb    Louis Meckel 02/24/2018, 12:23 PM

## 2018-02-25 ENCOUNTER — Inpatient Hospital Stay (HOSPITAL_COMMUNITY): Payer: Medicare Other

## 2018-02-25 DIAGNOSIS — I5042 Chronic combined systolic (congestive) and diastolic (congestive) heart failure: Secondary | ICD-10-CM

## 2018-02-25 DIAGNOSIS — E119 Type 2 diabetes mellitus without complications: Secondary | ICD-10-CM

## 2018-02-25 DIAGNOSIS — N184 Chronic kidney disease, stage 4 (severe): Secondary | ICD-10-CM

## 2018-02-25 DIAGNOSIS — Z01810 Encounter for preprocedural cardiovascular examination: Secondary | ICD-10-CM

## 2018-02-25 LAB — RENAL FUNCTION PANEL
Albumin: 2.6 g/dL — ABNORMAL LOW (ref 3.5–5.0)
Anion gap: 11 (ref 5–15)
BUN: 98 mg/dL — ABNORMAL HIGH (ref 6–20)
CO2: 21 mmol/L — ABNORMAL LOW (ref 22–32)
Calcium: 8.4 mg/dL — ABNORMAL LOW (ref 8.9–10.3)
Chloride: 105 mmol/L (ref 98–111)
Creatinine, Ser: 7.63 mg/dL — ABNORMAL HIGH (ref 0.61–1.24)
GFR calc Af Amer: 8 mL/min — ABNORMAL LOW (ref >60)
GFR calc non Af Amer: 7 mL/min — ABNORMAL LOW (ref >60)
Glucose, Bld: 134 mg/dL — ABNORMAL HIGH (ref 70–99)
Phosphorus: 8.7 mg/dL — ABNORMAL HIGH (ref 2.5–4.6)
Potassium: 3.6 mmol/L (ref 3.5–5.1)
Sodium: 137 mmol/L (ref 135–145)

## 2018-02-25 LAB — CBC WITH DIFFERENTIAL/PLATELET
Abs Immature Granulocytes: 0.05 10*3/uL (ref 0.00–0.07)
Basophils Absolute: 0 10*3/uL (ref 0.0–0.1)
Basophils Relative: 0 %
Eosinophils Absolute: 0.2 10*3/uL (ref 0.0–0.5)
Eosinophils Relative: 2 %
HEMATOCRIT: 25.9 % — AB (ref 39.0–52.0)
Hemoglobin: 7.9 g/dL — ABNORMAL LOW (ref 13.0–17.0)
Immature Granulocytes: 1 %
LYMPHS ABS: 0.8 10*3/uL (ref 0.7–4.0)
LYMPHS PCT: 9 %
MCH: 25.9 pg — ABNORMAL LOW (ref 26.0–34.0)
MCHC: 30.5 g/dL (ref 30.0–36.0)
MCV: 84.9 fL (ref 80.0–100.0)
MONOS PCT: 9 %
Monocytes Absolute: 0.8 10*3/uL (ref 0.1–1.0)
Neutro Abs: 7.1 10*3/uL (ref 1.7–7.7)
Neutrophils Relative %: 79 %
Platelets: 125 10*3/uL — ABNORMAL LOW (ref 150–400)
RBC: 3.05 MIL/uL — ABNORMAL LOW (ref 4.22–5.81)
RDW: 15 % (ref 11.5–15.5)
WBC: 8.9 10*3/uL (ref 4.0–10.5)
nRBC: 0 % (ref 0.0–0.2)

## 2018-02-25 LAB — GLUCOSE, CAPILLARY
GLUCOSE-CAPILLARY: 128 mg/dL — AB (ref 70–99)
Glucose-Capillary: 146 mg/dL — ABNORMAL HIGH (ref 70–99)
Glucose-Capillary: 121 mg/dL — ABNORMAL HIGH (ref 70–99)
Glucose-Capillary: 125 mg/dL — ABNORMAL HIGH (ref 70–99)

## 2018-02-25 MED ORDER — SODIUM CHLORIDE 0.9 % IV SOLN
250.0000 mg | Freq: Every day | INTRAVENOUS | Status: DC
  Filled 2018-02-25 (×2): qty 20

## 2018-02-25 MED ORDER — VANCOMYCIN HCL IN DEXTROSE 1-5 GM/200ML-% IV SOLN
1000.0000 mg | INTRAVENOUS | Status: AC
  Administered 2018-02-26: 1000 mg via INTRAVENOUS
  Filled 2018-02-25 (×2): qty 200

## 2018-02-25 MED ORDER — VANCOMYCIN HCL IN DEXTROSE 1-5 GM/200ML-% IV SOLN: 1000.0000 mg | INTRAVENOUS | Status: DC

## 2018-02-25 MED ORDER — CHLORHEXIDINE GLUCONATE CLOTH 2 % EX PADS
6.0000 | MEDICATED_PAD | Freq: Every day | CUTANEOUS | Status: DC
  Administered 2018-02-25 – 2018-02-27 (×3): 6 via TOPICAL

## 2018-02-25 NOTE — H&P (View-Only) (Signed)
Hospital Consult    Reason for Consult:  In need of permanent dialysis access and tunneled dialysis catheter Requesting Physician:  Justin Mend MRN #:  [id number]  History of Present Illness: This is a 52 y.o. male who presented to the ED a couple of days ago with somnolence and falls.  He has a hx of chronic combined systolic and diastolic heart failure and hx of AICD on the left, severe OSA, CAD, CKD stage 4, chronic normocytic anemia, HTN, diabetes and depression/anxiety.    On this admission, his renal insufficiency has progressed and now is in need of dialysis.  [facility] is consulted for [facility] placement and permanent dialysis access.    Pt is right hand dominant, but has a PPM/AICD on the left.   He is on insulin for his diabetes.  The pt is on a statin for cholesterol management. He is on a daily asa.  He is on a beta blocker.    Past Medical History:  Diagnosis Date  . Abnormality of gait 04/08/2013  . AICD (automatic cardioverter/defibrillator) present   . Anxiety    stress  . Anxiety and depression   . CAD (coronary artery disease)    a. OOH MI 6/11; presented with acute CHF; hosp course c/b VF arrest with VDRF, etc;   b s/p CABG: L-LAD/Dx, S-OM/dCFX;  c.Nuclear scan 8/12:  Inferior and inf-lat scar with minimal peri-infarct ischemia, EF 53%.; d.LHC 9/12:  LAD 90%, pD1 (small) 70%, prox large Dx 95%, L-LAD and Dx ok, CFX occluded, S-OM and CFX ok, RCA prox to prox/mid 60-70%.  Medical management     . Cardiac arrest - ventricular fibrillation 08/26/2009  . Carpal tunnel syndrome of right wrist   . Cellulitis 2012   spider bite  . Cellulitis 06/2017   right upper extremity  . Chronic systolic heart failure ([place])   . CKD (chronic kidney disease)   . CKD stage 4 due to type 2 diabetes mellitus ([place]) 04/11/2016  . Coma ([place])   . Diabetic coma with ketoacidosis ([place])    Occured in 1994 where he spent 30 days in a coma . He develpoed infection of his leg and developing pancreatitis during that   time   . Diabetic polyneuropathy ([place]) 04/08/2013  . DM2 (diabetes mellitus, type 2) ([place])   . Dyslipidemia associated with type 2 diabetes mellitus ([place]) 09/26/2016  . Dysrhythmia   . Foot drop, bilateral 04/08/2013  . Headache(784.0)    sinus  . History of degenerative disc disease   . HLD (hyperlipidemia)   . HTN (hypertension)   . Hypercholesteremia   . Hypertension associated with stage 3 chronic kidney disease due to type 2 diabetes mellitus ([place]) 09/26/2016  . Ischemic cardiomyopathy    echo 10/11:  inf-septal and apical HK, EF 35%, mod LAE  . Neuropathy   . Nocturnal leg cramps   . Nocturnal leg cramps 04/02/2017  . Obesity   . OSA (obstructive sleep apnea) 02/02/2012  . Pancreatitis 2008  . Periodic limb movement 04/02/2017  . 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 Presence of permanent cardiac pacemaker   . Retinal detachment    Bilateral, laser surgery on the right  . RLS (restless legs syndrome) 04/22/2015  . Thyroid disease   . Type II diabetes mellitus with renal manifestations ([place]) 07/10/2017  . 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 Uncontrolled type 2 diabetes mellitus with diabetic neuropathy, with long-term current use of insulin ([place]) 09/26/2016  . Uncontrolled type  2 diabetes mellitus with severe nonproliferative retinopathy and macular edema, without long-term current use of insulin ([place]) 09/26/2016  . Uncontrolled type 2 diabetes mellitus with stage 4 chronic kidney disease, with long-term current use of insulin ([place]) 09/26/2016    Past Surgical History:  Procedure Laterality Date  . APPENDECTOMY  mid 1990  . CARDIAC DEFIBRILLATOR PLACEMENT    . CARDIAC SURGERY    . CARPAL TUNNEL WITH CUBITAL TUNNEL  02/22/2012   Procedure: CARPAL TUNNEL WITH CUBITAL TUNNEL;  Surgeon: Roseanne Kaufman, MD;  Location: [place];  Service: Orthopedics;  Laterality: Right;  Right Carpal Tunnel Release/Right Cubital Tunnel Release and Transposition if Necessary with Flexor Pronator  Release  . CATARACT EXTRACTION    . COLONOSCOPY WITH PROPOFOL N/A 11/17/2014   Procedure: COLONOSCOPY WITH PROPOFOL;  Surgeon: Manus Gunning, MD;  [facility];  Service: Gastroenterology;  Laterality: N/A;  . COLONOSCOPY WITH PROPOFOL N/A 01/05/2015   Procedure: COLONOSCOPY WITH PROPOFOL;  Surgeon: Manus Gunning, MD;  Location: [facility];  Service: Gastroenterology;  Laterality: N/A;  . CORONARY ARTERY BYPASS GRAFT    . coronary artery bypass grafting x4  june 29,2011   x 4  . I&D EXTREMITY Right 06/26/2017   Procedure: IRRIGATION AND DEBRIDEMENT RIGHT FOREARM;  Surgeon: Roseanne Kaufman, MD;  Location: [place];  Service: Orthopedics;  Laterality: Right;  . I&D EXTREMITY Right 06/28/2017   Procedure: Repeat irrigation and debridement right forearm;  Surgeon: Roseanne Kaufman, MD;  Location: [place];  Service: Orthopedics;  Laterality: Right;  . I&D EXTREMITY Right 06/30/2017   Procedure: Repeat irrigation and debridement right forearm and reconstruction as necessary;  Surgeon: Roseanne Kaufman, MD;  Location: [place];  Service: Orthopedics;  Laterality: Right;  60 mins  . PACEMAKER PLACEMENT    . RETINAL DETACHMENT SURGERY Bilateral   . RETINAL DETACHMENT SURGERY Bilateral    3 months ago  . right sided abdominal cyst removal    . TONSILLECTOMY  1998  . ULNAR NERVE TRANSPOSITION Right     Allergies  Allergen Reactions  . Ace Inhibitors Cough  . Cephalexin Other (See Comments)    Resistant to antibiotic  . Beta Adrenergic Blockers Cough    Prior to Admission medications   Medication Sig Start Date End Date Taking? Authorizing Provider  acetaminophen (TYLENOL) 500 MG tablet Take 1,000 mg by mouth every 8 (eight) hours as needed for mild pain.    Yes [provider]  aspirin EC 81 MG tablet Take 81 mg by mouth daily.   Yes [provider]  baclofen (LIORESAL) 20 MG tablet Take 1 tablet (20 mg total) by mouth 3 (three) times daily. Must be seen  prior to future refills 02/06/18  Yes Kathrynn Ducking, MD  busPIRone (BUSPAR) 10 MG tablet Take 1 tablet (10 mg total) by mouth 2 (two) times daily. 11/06/17  Yes Cloria Spring, MD  ergocalciferol (VITAMIN D2) 50000 units capsule Take 1 capsule (50,000 Units total) by mouth once a week. One capsule once weekly 08/16/17  Yes [name], MD  ezetimibe (ZETIA) 10 MG tablet Take 10 mg by mouth daily.   Yes [provider]  FLUoxetine (PROZAC) 20 MG capsule Take 1 capsule (20 mg total) by mouth daily. 11/06/17 11/06/18 Yes Cloria Spring, MD  fluticasone [facility]) 50 MCG/ACT nasal spray Place 2 sprays into both nostrils daily. 04/11/17  Yes [name], MD  furosemide (LASIX) 40 MG tablet Take 3 tablets (120 mg total) by mouth  daily. Patient taking differently: Take 80 mg by mouth 2 (two) times daily. Takes 1 tablet twice daily --increases to 3 tablets daily with edema 11/23/17 02/23/18 Yes Evans Lance, MD  Insulin Degludec (TRESIBA) 100 UNIT/ML SOLN Inject 40 Units into the skin 2 (two) times daily.    Yes [provider]  Melatonin 10 MG TABS Take 10 mg by mouth at bedtime.   Yes [provider]  methylcellulose (ARTIFICIAL TEARS) 1 % ophthalmic solution Place 1 drop into both eyes 2 (two) times daily as needed (dry eyes).   Yes [provider]  metoprolol succinate (TOPROL-XL) 50 MG 24 hr tablet Take 1 tablet (50 mg total) by mouth daily. Patient taking differently: Take 25 mg by mouth daily.  11/09/17  Yes Baldwin Jamaica, PA-C  mirtazapine (REMERON) 30 MG tablet Take 1 tablet (30 mg total) by mouth at bedtime. 11/06/17  Yes Cloria Spring, MD  Multiple Vitamin (MULTIVITAMIN) tablet Take 1 tablet by mouth daily.     Yes [provider]  nitroGLYCERIN (NITROSTAT) 0.4 MG SL tablet Place 1 tablet (0.4 mg total) under the tongue every 5 (five) minutes as needed for chest pain. 01/21/16 01/26/20 Yes Evans Lance, MD  Propylhexedrine [facility])  INHA Place 1 each into the nose 2 (two) times daily as needed (congestion).    Yes [provider]  rosuvastatin (CRESTOR) 40 MG tablet Take 1 tablet (40 mg total) by mouth daily. 11/09/17  Yes Baldwin Jamaica, PA-C  vitamin C (VITAMIN C) 1000 MG tablet Take 1 tablet (1,000 mg total) by mouth daily. 07/04/17  Yes Roxan Hockey, MD    Social History   Socioeconomic History  . Marital status: Divorced    Spouse name: Not on file  . Number of children: 0  . Years of education: college  . Highest education level: Not on file  Occupational History  . Occupation: Engineer, production: [employer]    Employer: [employer]  . Financial resource strain: Not on file  . Food insecurity:    Worry: Not on file    Inability: Not on file  . Transportation needs:    Medical: Not on file    Non-medical: Not on file  Tobacco Use  . Smoking status: Former Smoker    Types: Cigars  . Smokeless tobacco: Current User    Types: Snuff  . Tobacco comment: Occasional cigar  Substance and Sexual Activity  . Alcohol use: No    Alcohol/week: 0.0 standard drinks    Comment: rarely  . Drug use: No  . Sexual activity: Never  Lifestyle  . Physical activity:    Days per week: Not on file    Minutes per session: Not on file  . Stress: Not on file  Relationships  . Social connections:    Talks on phone: Not on file    Gets together: Not on file    Attends religious service: Not on file    Active member of club or organization: Not on file    Attends meetings of clubs or organizations: Not on file    Relationship status: Not on file  . Intimate partner violence:    Fear of current or ex partner: Not on file    Emotionally abused: Not on file    Physically abused: Not on file    Forced sexual activity: Not on file  Other Topics Concern  . Not on file  Social History Narrative  Patient drinks about 6 cups of caffeine daily.   Patient is left handed.     Family  History  Problem Relation Age of Onset  . Allergies Mother   . Diabetes Mother   . Hypertension Mother   . Hyperlipidemia Mother   . Dementia Mother   . Colon cancer Mother   . Lung cancer Mother   . Kidney cancer Mother   . Colon polyps Mother   . Stroke Father   . Allergies Father   . Dementia Father   . Alcohol abuse Father   . Colon polyps Father   . Diabetes Sister   . Heart disease Sister   . Anxiety disorder Sister   . Cancer Maternal Grandmother        unknown type  . Schizophrenia Paternal Uncle   . Schizophrenia Cousin     ROS: [x]  Positive   [ ]  Negative   [ ]  All sytems reviewed and are negative  Cardiac: [x]  heart failure [x]  AICD [x]  hx CABG  Vascular: []  pain in legs while walking []  pain in legs at rest []  pain in legs at night []  non-healing ulcers []  hx of DVT []  swelling in legs  Pulmonary: [x]  OSA  Neurologic: [x]  neuropathy   Hematologic: [x]  anemia   Endocrine:   [x]  diabetes   [x]  thyroid dz  GI [x]  hx pancreatitis  GU: [x]  CKD/renal failure []  HD--[]  M/W/F or []  T/T/S  Psychiatric: [x]  anxiety [x]  depression  Musculoskeletal: [x]  DDD [x]  hx CTS  Integumentary: [x]  scar right arm  Constitutional: []  fever []  chills   Physical Examination  Vitals:   02/24/18 2317 02/25/18 0801  BP: (!) 131/93 139/88  Pulse: 82 83  Resp: 18   Temp: 98.3 F (36.8 C) 97.8 F (36.6 C)  SpO2: 100% 99%   Body mass index is 40.17 kg/m.  General:  WDWN in NAD Gait: Not observed HENT: WNL, normocephalic Pulmonary: normal non-labored breathing, without Rales, rhonchi,  wheezing Cardiac: regular Skin: well healed incision on right arm ~ 5-7" from wrist proximally Vascular Exam/Pulses: Easily palpable right radial pulse; right cephalic vein present Extremities: without ischemic changes, without Gangrene , without cellulitis; without open wounds;  Musculoskeletal: no muscle wasting or atrophy  Neurologic: A&O X 3;  No focal  weakness or paresthesias are detected; speech is fluent/normal Psychiatric:  The pt has Normal affect.  CBC    Component Value Date/Time   WBC 8.9 02/25/2018 0250   RBC 3.05 (L) 02/25/2018 0250   HGB 7.9 (L) 02/25/2018 0250   HCT 25.9 (L) 02/25/2018 0250   PLT 125 (L) 02/25/2018 0250   MCV 84.9 02/25/2018 0250   MCH 25.9 (L) 02/25/2018 0250   MCHC 30.5 02/25/2018 0250   RDW 15.0 02/25/2018 0250   LYMPHSABS 0.8 02/25/2018 0250   MONOABS 0.8 02/25/2018 0250   EOSABS 0.2 02/25/2018 0250   BASOSABS 0.0 02/25/2018 0250    BMET    Component Value Date/Time   NA 137 02/25/2018 0250   K 3.6 02/25/2018 0250   CL 105 02/25/2018 0250   CO2 21 (L) 02/25/2018 0250   GLUCOSE 134 (H) 02/25/2018 0250   BUN 98 (H) 02/25/2018 0250   CREATININE 7.63 (H) 02/25/2018 0250   CREATININE 3.12 (H) 08/15/2017 1419   CALCIUM 8.4 (L) 02/25/2018 0250   GFRNONAA 7 (L) 02/25/2018 0250   GFRNONAA 22 (L) 08/15/2017 1419   GFRAA 8 (L) 02/25/2018 0250   GFRAA 25 (L) 08/15/2017 1419  COAGS: Lab Results  Component Value Date   INR 0.97 11/18/2010   INR 1.49 09/01/2009   INR 1.10 08/26/2009     Non-Invasive Vascular Imaging:   Vein mapping ordered  Statin:  Yes.   Beta Blocker:  Yes.   Aspirin:  Yes.   ACEI:  No. ARB:  No. CCB use:  No Other antiplatelets/anticoagulants:  No.    ASSESSMENT/PLAN: This is a 52 y.o. male with progressive renal insufficiency in setting of diabetes and hypertension in need of tunneled dialysis catheter and permanent dialysis access   -pt is right hand dominant, however, he has a PPM/AICD on the left.  He has had a necrotizing fascitis of the right forearm, wrist and hand that required extensive surgery by Dr. Amedeo Plenty in April of this year.  Upon Dr. Luther Parody exam of the pt, he has an easily palpable right radial pulse and cephalic vein that appears adequate for fistula.  Will get formal vein mapping today as we are planning for OR tomorrow for right arm fistula vs  graft and placement of tunneled dialysis catheter. -npo after MN/consent   Leontine Locket, PA-C [facility] [phone]  I have examined the patient, reviewed and agree with above.  Patient now agreeing to hemodialysis.  Explained options to include tunneled catheter, AV fistula and AV graft.  Easily palpable right radial pulse and does have large antecubital cephalic vein.  Discussed plan for tunneled catheter for acute dialysis and right arm AV fistula versus graft for long-term dialysis.  Plan surgery with Dr. Trula Slade tomorrow  Curt Jews, MD 02/25/2018 2:06 PM

## 2018-02-25 NOTE — Progress Notes (Addendum)
[facility] PROGRESS NOTE  Matthew [name] VVO:[id number] DOB: 1965/11/07 DOA: 02/10/2018  PCP: [name], MD  Brief History/Interval Summary: 52-year-old male with a past medical history of coronary artery disease status post bypass, history of cardiac arrest status post pacemaker/AICD, chronic kidney disease stage IV, diabetes mellitus type 2, hyperlipidemia, history of obstructive sleep apnea recently diagnosed hospitalized in April for hand cellulitis presented with somnolence falls patient was noted to have worsening renal function with a creatinine up to 7.4 from his baseline of 3.1.  He was also found to have urinary retention.  He was hospitalized for further management.     Reason for Visit: Acute renal failure.  Consultants: Nephrology  Procedures: None  Antibiotics: None  Subjective/Interval History: Patient states that he slept poorly.  Not to use the CPAP machine.  Has been passing urine.  Requesting Foley catheter be removed.  Denies any pain.  ROS: Denies any shortness of breath  Objective:  Vital Signs  Vitals:   02/24/18 0804 02/24/18 1700 02/24/18 2317 02/25/18 0801  BP: 137/72 (!) 137/122 (!) 131/93 139/88  Pulse: 70 88 82 83  Resp: 20  18   Temp:  98.1 F (36.7 C) 98.3 F (36.8 C) 97.8 F (36.6 C)  TempSrc:  Oral Oral Oral  SpO2: 97% 98% 100% 99%  Weight:      Height:        Intake/Output Summary (Last 24 hours) at 02/25/2018 1327 Last data filed at 02/25/2018 1200 Gross per 24 hour  Intake 722.24 ml  Output 1326 ml  Net -603.76 ml   Filed Weights   02/21/2018 2329 02/23/18 0500 02/24/18 0600  Weight: 115.7 kg 121.5 kg 123.4 kg    General appearance: alert, cooperative, appears stated age and no distress Head: Normocephalic, without obvious abnormality, atraumatic Resp: clear to auscultation bilaterally Cardio: regular rate and rhythm, S1, S2 normal, no murmur, click, rub or gallop GI: soft, non-tender; bowel sounds  normal; no masses,  no organomegaly Extremities: Edema is noted bilateral lower extremity Pulses: 2+ and symmetric Neurologic: No focal neurological deficits.  Lab Results:  Data Reviewed: I have personally reviewed following labs and imaging studies  CBC: Recent Labs  Lab 03/02/2018 2330 02/23/18 0003 02/24/18 0259 02/25/18 0250  WBC 11.2*  --  8.3 8.9  NEUTROABS 9.2*  --  6.4 7.1  HGB 8.5* 8.5* 7.8* 7.9*  HCT 28.2* 25.0* 25.2* 25.9*  MCV 86.5  --  86.3 84.9  PLT 124*  --  117* 125*    Basic Metabolic Panel: Recent Labs  Lab 02/14/2018 2330 02/23/18 0003 02/23/18 0554 02/24/18 0259 02/25/18 0250  NA 139 139 141 138 137  K 3.7 3.7 3.8 3.6 3.6  CL 104 106 107 105 105  CO2 16*  --  18* 16* 21*  GLUCOSE 132* 126* 122* 168* 134*  BUN 83* 92* 85* 96* 98*  CREATININE 7.49* 7.90* 7.58* 7.74* 7.63*  CALCIUM 8.8*  --  8.9 8.3* 8.4*  PHOS  --   --   --  9.6* 8.7*    GFR: Estimated Creatinine Clearance: 14.4 mL/min (A) (by C-G formula based on SCr of 7.63 mg/dL (H)).  Liver Function Tests: Recent Labs  Lab 02/11/2018 2330 02/23/18 0554 02/24/18 0259 02/25/18 0250  AST 108* 96*  --   --   ALT 49* 52*  --   --   ALKPHOS 91 94  --   --   BILITOT 1.0 0.9  --   --  PROT 6.4* 6.3*  --   --   ALBUMIN 3.0* 2.8* 2.4* 2.6*     Recent Labs  Lab 02/23/18 0006  AMMONIA 24    CBG: Recent Labs  Lab 02/24/18 1235 02/24/18 1733 02/24/18 2122 02/25/18 0757 02/25/18 1127  GLUCAP 205* 213* 173* 128* 146*    Anemia Panel: Recent Labs    02/24/18 1121  VITAMINB12 1,135*  FOLATE 26.1  FERRITIN 110  TIBC 207*  IRON 33*  RETICCTPCT 1.7     Radiology Studies: No results found.   Medications:  Scheduled: . aspirin EC  81 mg Oral Daily  . busPIRone  10 mg Oral BID  . calcitRIOL  0.5 mcg Oral Daily  . calcium acetate  1,334 mg Oral TID WC  . Chlorhexidine Gluconate Cloth  6 each Topical Q0600  . darbepoetin (ARANESP) injection - NON-DIALYSIS  150 mcg  Subcutaneous Q Mon-1800  . FLUoxetine  20 mg Oral Daily  . heparin  5,000 Units Subcutaneous Q8H  . insulin aspart  0-5 Units Subcutaneous QHS  . insulin aspart  0-9 Units Subcutaneous TID WC  . insulin glargine  10 Units Subcutaneous BID  . metoprolol succinate  25 mg Oral Daily  . rosuvastatin  10 mg Oral q1800  . sodium bicarbonate  650 mg Oral TID  . sodium chloride flush  3 mL Intravenous Q12H  . tamsulosin  0.4 mg Oral Daily   Continuous: . sodium chloride    . sodium chloride Stopped (02/25/18 0902)  . ferric gluconate (FERRLECIT/NULECIT) IV 250 mg (02/24/18 1553)  . ferric gluconate (FERRLECIT/NULECIT) IV     ZTI:WPYKDX chloride, acetaminophen **OR** acetaminophen, ondansetron **OR** ondansetron (ZOFRAN) IV, sodium chloride, sodium chloride flush    Assessment/Plan:  Acute renal failure superimposed on chronic kidney disease stage IV Initially thought to be due to urinary retention but there was no improvement despite Foley catheter placement.  Stated this appears to be an intrinsic renal failure.  Patient seen by nephrology.  No improvement in renal function since admission.  Discussed with Dr. Justin Mend.  He recommends initiating dialysis.  Initially patient refused.  Both Dr. Justin Mend and I discussed the issue with the patient together.  He is now agreeable to undergo dialysis.  Monitor urine output.  Urinary retention Patient found to have urinary retention at the time of admission.  Foley catheter was placed.  He has had good urine output.  Okay to discontinue Foley catheter for now.  Voiding trial.  Continue tamsulosin.  Acute metabolic encephalopathy Most likely this is due to his uremia.  Imaging studies unremarkable.  Patient does not have any neurological deficits.  Mentation appears to have improved.  Continue to monitor.  Chronic combined systolic and diastolic CHF EF noted to be 45 to 50% based on echo done in May 2019.  Previously it has been low.  Patient is status  post AICD.  Insulin-dependent diabetes mellitus HbA1c 9.3 in April.  Managed at home with [place].  On Lantus here in the hospital at lower dose.  Monitor CBGs.  Anemia of chronic kidney disease No evidence of overt bleeding.  Hemoglobin is stable.  History of depression and anxiety Continue home medications.  History of coronary artery disease status post CABG Stable.  Morbid obesity BMI 40  Obstructive sleep apnea Patient tells that he underwent sleep study recently.  Continue CPAP here in the hospital.  Progress note from 10/12 noted.  Found to have severe OSA with AHI of 62.1/h.  Patient actually was  recommended BiPAP due to suboptimal CPAP titration.  Pressure injury Stage II - Partial thickness loss of dermis presenting as a shallow open ulcer with a red, pink wound bed without slough.Sacrum, posterior, medial.  Wound care.   DVT Prophylaxis: Subcutaneous heparin    Code Status: Full code Family Communication: Discussed with the patient Disposition Plan: Management as outlined above.  Plan is to initiate dialysis here in the hospital.    LOS: 1 day   [facility] Pager [phone] 02/25/2018, 1:27 PM  If 7PM-7AM, please contact night-coverage at [email or web address], password [facility]

## 2018-02-25 NOTE — Progress Notes (Signed)
Right upper extremity vein mapping completed. Please see preliminary notes on CV PROC under chart review. Annely Sliva H Muriah Harsha(RDMS RVT) 02/25/18 3:03 PM

## 2018-02-25 NOTE — Progress Notes (Signed)
[facility] ROUNDING NOTE   Subjective:   Chronically ill nondistressed gentleman this morning.  Blood pressure 139/88 pulse 83 temperature 97.8  Urine output 700 cc 02/25/2018  Sodium 137 potassium 3.6 chloride 105 CO2 21 BUN 98 creatinine 7.63 calcium 8.4 glucose 134 Albumin 2.6 phosphorus 8.7.  WBC 8.9 hemoglobin 7.9 platelets 125  Objective:  Vital signs in last 24 hours:  Temp:  [97.8 F (36.6 C)-98.3 F (36.8 C)] 97.8 F (36.6 C) (12/23 0801) Pulse Rate:  [82-88] 83 (12/23 0801) Resp:  [18] 18 (12/22 2317) BP: (131-139)/(88-122) 139/88 (12/23 0801) SpO2:  [98 %-100 %] 99 % (12/23 0801)  Weight change:  Filed Weights   02/05/2018 2329 02/23/18 0500 02/24/18 0600  Weight: 115.7 kg 121.5 kg 123.4 kg    Intake/Output: I/O last 3 completed shifts: In: 2870.6 [P.O.:420; I.V.:1400.6; Other:1050] Out: 1777 [CWCBJ:6283; Stool:2]   Intake/Output this shift:  No intake/output data recorded.  Chronically ill-appearing multiple abrasions CVS- RRR murmurs rubs gallops JVP not elevated RS- CTA clear to auscultation ABD- BS present soft non-distended obese abdomen EXT-1+ edema   Basic Metabolic Panel: Recent Labs  Lab 02/26/2018 2330 02/23/18 0003 02/23/18 0554 02/24/18 0259 02/25/18 0250  NA 139 139 141 138 137  K 3.7 3.7 3.8 3.6 3.6  CL 104 106 107 105 105  CO2 16*  --  18* 16* 21*  GLUCOSE 132* 126* 122* 168* 134*  BUN 83* 92* 85* 96* 98*  CREATININE 7.49* 7.90* 7.58* 7.74* 7.63*  CALCIUM 8.8*  --  8.9 8.3* 8.4*  PHOS  --   --   --  9.6* 8.7*    Liver Function Tests: Recent Labs  Lab 03/05/2018 2330 02/23/18 0554 02/24/18 0259 02/25/18 0250  AST 108* 96*  --   --   ALT 49* 52*  --   --   ALKPHOS 91 94  --   --   BILITOT 1.0 0.9  --   --   PROT 6.4* 6.3*  --   --   ALBUMIN 3.0* 2.8* 2.4* 2.6*   No results for input(s): LIPASE, AMYLASE in the last 168 hours. Recent Labs  Lab 02/23/18 0006  AMMONIA 24    CBC: Recent Labs  Lab  02/08/2018 2330 02/23/18 0003 02/24/18 0259 02/25/18 0250  WBC 11.2*  --  8.3 8.9  NEUTROABS 9.2*  --  6.4 7.1  HGB 8.5* 8.5* 7.8* 7.9*  HCT 28.2* 25.0* 25.2* 25.9*  MCV 86.5  --  86.3 84.9  PLT 124*  --  117* 125*    Cardiac Enzymes: No results for input(s): CKTOTAL, CKMB, CKMBINDEX, TROPONINI in the last 168 hours.  BNP: Invalid input(s): POCBNP  CBG: Recent Labs  Lab 02/24/18 0802 02/24/18 1235 02/24/18 1733 02/24/18 2122 02/25/18 0757  GLUCAP 173* 205* 213* 173* 128*    Microbiology: Results for orders placed or performed during the hospital encounter of 07/10/17  Culture, blood (Routine X 2) w Reflex to ID Panel     Status: None   Collection Time: 07/10/17 11:30 PM  Result Value Ref Range Status   Specimen Description BLOOD LEFT ANTECUBITAL  Final   Special Requests   Final    BOTTLES DRAWN AEROBIC AND ANAEROBIC Blood Culture adequate volume   Culture   Final    NO GROWTH 5 DAYS Performed at [facility], [place] [address] [place], [place] [address]    Report Status 07/16/2017 FINAL  Final  Culture, blood (Routine X 2) w Reflex to  ID Panel     Status: None   Collection Time: 07/11/17  1:31 AM  Result Value Ref Range Status   Specimen Description BLOOD LEFT ARM  Final   Special Requests   Final    BOTTLES DRAWN AEROBIC AND ANAEROBIC Blood Culture adequate volume   Culture   Final    NO GROWTH 5 DAYS Performed at [facility], [address], [place], [place] [address]    Report Status 07/16/2017 FINAL  Final    Coagulation Studies: No results for input(s): LABPROT, INR in the last 72 hours.  Urinalysis: Recent Labs    02/23/18 0657  COLORURINE YELLOW  LABSPEC 1.025  PHURINE 5.5  GLUCOSEU 250*  HGBUR LARGE*  BILIRUBINUR NEGATIVE  KETONESUR 15*  PROTEINUR >300*  NITRITE NEGATIVE  LEUKOCYTESUR NEGATIVE      Imaging: No results found.   Medications:   . sodium chloride    . sodium chloride 50 mL/hr at 02/25/18 0700  . ferric  gluconate (FERRLECIT/NULECIT) IV 250 mg (02/24/18 1553)   . aspirin EC  81 mg Oral Daily  . busPIRone  10 mg Oral BID  . calcitRIOL  0.5 mcg Oral Daily  . calcium acetate  1,334 mg Oral TID WC  . darbepoetin (ARANESP) injection - NON-DIALYSIS  150 mcg Subcutaneous Q Mon-1800  . FLUoxetine  20 mg Oral Daily  . heparin  5,000 Units Subcutaneous Q8H  . insulin aspart  0-5 Units Subcutaneous QHS  . insulin aspart  0-9 Units Subcutaneous TID WC  . insulin glargine  10 Units Subcutaneous BID  . metoprolol succinate  25 mg Oral Daily  . rosuvastatin  10 mg Oral q1800  . sodium bicarbonate  650 mg Oral TID  . sodium chloride flush  3 mL Intravenous Q12H  . tamsulosin  0.4 mg Oral Daily   sodium chloride, acetaminophen **OR** acetaminophen, ondansetron **OR** ondansetron (ZOFRAN) IV, sodium chloride, sodium chloride flush  Assessment/ Plan:   Progressive renal insufficiency secondary to diabetes/hypertension.  Foley catheter placed with no improvement in renal function GFR less than 10 cc/min.  I suspect that we will need to initiate dialysis if he is agreeable.  Renal ultrasound 10.7 cm kidney on the right 12.1 cm kidney on left no hydronephrosis mild cortical thinning.  Consult VVS for fistula and catheter placement.  We will also inform the dialysis unit to initiate clip process  Anemia T sat 16%.  Will start intravenous iron.  Darbepoetin administered 02/25/2018 150 mcg.  Bones.  Calcitriol 0.5 mcg daily started 02/24/2018 PhosLo 1.334 g 3 times daily with meals started 02/24/2018.  Still has very significant hypophosphatemia will need to follow calcium and phosphorus closely in setting of calcitriol and calcium based binders.  Diabetes mellitus as per primary team  Hypertension/volume I suspect will need dialysis to remove volume and maintain euvolemia.  Continues on tamsulosin for BPH.  Acute encephalopathy agree with Dr. Verlon Au that this is most likely multifactorial uremia and build  up her psychotropic medications.  Agree with holding baclofen.  Diastolic-systolic heart failure EF 45%  Bipolar disease currently on Prozac and BuSpar.  The need to involve psychiatry  CAD with cardiac arrest 2011 lipid-lowering agents beta-blocker and aspirin  Status post pacemaker [place].  Already interrogated in the emergency room   LOS: [place] @TODAY @8 :68 AM

## 2018-02-25 NOTE — Consult Note (Addendum)
Hospital Consult    Reason for Consult:  In need of permanent dialysis access and tunneled dialysis catheter Requesting Physician:  Justin Mend MRN #:  [id number]  History of Present Illness: This is a 52 y.o. male who presented to the ED a couple of days ago with somnolence and falls.  He has a hx of chronic combined systolic and diastolic heart failure and hx of AICD on the left, severe OSA, CAD, CKD stage 4, chronic normocytic anemia, HTN, diabetes and depression/anxiety.    On this admission, his renal insufficiency has progressed and now is in need of dialysis.  VVS is consulted for [facility] placement and permanent dialysis access.    Pt is right hand dominant, but has a PPM/AICD on the left.   He is on insulin for his diabetes.  The pt is on a statin for cholesterol management. He is on a daily asa.  He is on a beta blocker.    Past Medical History:  Diagnosis Date  . Abnormality of gait 04/08/2013  . AICD (automatic cardioverter/defibrillator) present   . Anxiety    stress  . Anxiety and depression   . CAD (coronary artery disease)    a. OOH MI 6/11; presented with acute CHF; hosp course c/b VF arrest with VDRF, etc;   b s/p CABG: L-LAD/Dx, S-OM/dCFX;  c.Nuclear scan 8/12:  Inferior and inf-lat scar with minimal peri-infarct ischemia, EF 53%.; d.LHC 9/12:  LAD 90%, pD1 (small) 70%, prox large Dx 95%, L-LAD and Dx ok, CFX occluded, S-OM and CFX ok, RCA prox to prox/mid 60-70%.  Medical management     . Cardiac arrest - ventricular fibrillation 08/26/2009  . Carpal tunnel syndrome of right wrist   . Cellulitis 2012   spider bite  . Cellulitis 06/2017   right upper extremity  . Chronic systolic heart failure ([place])   . CKD (chronic kidney disease)   . CKD stage 4 due to type 2 diabetes mellitus ([place]) 04/11/2016  . Coma ([place])   . Diabetic coma with ketoacidosis ([place])    Occured in 1994 where he spent 30 days in a coma . He develpoed infection of his leg and developing pancreatitis during that   time   . Diabetic polyneuropathy ([place]) 04/08/2013  . DM2 (diabetes mellitus, type 2) ([place])   . Dyslipidemia associated with type 2 diabetes mellitus ([place]) 09/26/2016  . Dysrhythmia   . Foot drop, bilateral 04/08/2013  . Headache(784.0)    sinus  . History of degenerative disc disease   . HLD (hyperlipidemia)   . HTN (hypertension)   . Hypercholesteremia   . Hypertension associated with stage 3 chronic kidney disease due to type 2 diabetes mellitus ([place]) 09/26/2016  . Ischemic cardiomyopathy    echo 10/11:  inf-septal and apical HK, EF 35%, mod LAE  . Neuropathy   . Nocturnal leg cramps   . Nocturnal leg cramps 04/02/2017  . Obesity   . OSA (obstructive sleep apnea) 02/02/2012  . Pancreatitis 2008  . Periodic limb movement 04/02/2017  . 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 Presence of permanent cardiac pacemaker   . Retinal detachment    Bilateral, laser surgery on the right  . RLS (restless legs syndrome) 04/22/2015  . Thyroid disease   . Type II diabetes mellitus with renal manifestations ([place]) 07/10/2017  . 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 Uncontrolled type 2 diabetes mellitus with diabetic neuropathy, with long-term current use of insulin ([place]) 09/26/2016  . Uncontrolled type  2 diabetes mellitus with severe nonproliferative retinopathy and macular edema, without long-term current use of insulin ([place]) 09/26/2016  . Uncontrolled type 2 diabetes mellitus with stage 4 chronic kidney disease, with long-term current use of insulin ([place]) 09/26/2016    Past Surgical History:  Procedure Laterality Date  . APPENDECTOMY  mid 1990  . CARDIAC DEFIBRILLATOR PLACEMENT    . CARDIAC SURGERY    . CARPAL TUNNEL WITH CUBITAL TUNNEL  02/22/2012   Procedure: CARPAL TUNNEL WITH CUBITAL TUNNEL;  Surgeon: Roseanne Kaufman, MD;  Location: [place];  Service: Orthopedics;  Laterality: Right;  Right Carpal Tunnel Release/Right Cubital Tunnel Release and Transposition if Necessary with Flexor Pronator  Release  . CATARACT EXTRACTION    . COLONOSCOPY WITH PROPOFOL N/A 11/17/2014   Procedure: COLONOSCOPY WITH PROPOFOL;  Surgeon: Manus Gunning, MD;  Location: [facility];  Service: Gastroenterology;  Laterality: N/A;  . COLONOSCOPY WITH PROPOFOL N/A 01/05/2015   Procedure: COLONOSCOPY WITH PROPOFOL;  Surgeon: Manus Gunning, MD;  Location: [facility];  Service: Gastroenterology;  Laterality: N/A;  . CORONARY ARTERY BYPASS GRAFT    . coronary artery bypass grafting x4  june 29,2011   x 4  . I&D EXTREMITY Right 06/26/2017   Procedure: IRRIGATION AND DEBRIDEMENT RIGHT FOREARM;  Surgeon: Roseanne Kaufman, MD;  Location: [place];  Service: Orthopedics;  Laterality: Right;  . I&D EXTREMITY Right 06/28/2017   Procedure: Repeat irrigation and debridement right forearm;  Surgeon: Roseanne Kaufman, MD;  Location: [place];  Service: Orthopedics;  Laterality: Right;  . I&D EXTREMITY Right 06/30/2017   Procedure: Repeat irrigation and debridement right forearm and reconstruction as necessary;  Surgeon: Roseanne Kaufman, MD;  Location: [place];  Service: Orthopedics;  Laterality: Right;  60 mins  . PACEMAKER PLACEMENT    . RETINAL DETACHMENT SURGERY Bilateral   . RETINAL DETACHMENT SURGERY Bilateral    3 months ago  . right sided abdominal cyst removal    . TONSILLECTOMY  1998  . ULNAR NERVE TRANSPOSITION Right     Allergies  Allergen Reactions  . Ace Inhibitors Cough  . Cephalexin Other (See Comments)    Resistant to antibiotic  . Beta Adrenergic Blockers Cough    Prior to Admission medications   Medication Sig Start Date End Date Taking? Authorizing Provider  acetaminophen (TYLENOL) 500 MG tablet Take 1,000 mg by mouth every 8 (eight) hours as needed for mild pain.    Yes [provider]  aspirin EC 81 MG tablet Take 81 mg by mouth daily.   Yes [provider]  baclofen (LIORESAL) 20 MG tablet Take 1 tablet (20 mg total) by mouth 3 (three) times daily. Must be seen  prior to future refills 02/06/18  Yes Kathrynn Ducking, MD  busPIRone (BUSPAR) 10 MG tablet Take 1 tablet (10 mg total) by mouth 2 (two) times daily. 11/06/17  Yes Cloria Spring, MD  ergocalciferol (VITAMIN D2) 50000 units capsule Take 1 capsule (50,000 Units total) by mouth once a week. One capsule once weekly 08/16/17  Yes [name], MD  ezetimibe (ZETIA) 10 MG tablet Take 10 mg by mouth daily.   Yes [provider]  FLUoxetine (PROZAC) 20 MG capsule Take 1 capsule (20 mg total) by mouth daily. 11/06/17 11/06/18 Yes Cloria [place], MD  fluticasone [facility]) 50 MCG/ACT nasal spray Place 2 sprays into both nostrils daily. 04/11/17  Yes [name], MD  furosemide (LASIX) 40 MG tablet Take 3 tablets (120 mg total) by mouth  daily. Patient taking differently: Take 80 mg by mouth 2 (two) times daily. Takes 1 tablet twice daily --increases to 3 tablets daily with edema 11/23/17 02/23/18 Yes Evans Lance, MD  Insulin Degludec (TRESIBA) 100 UNIT/ML SOLN Inject 40 Units into the skin 2 (two) times daily.    Yes [provider]  Melatonin 10 MG TABS Take 10 mg by mouth at bedtime.   Yes [provider]  methylcellulose (ARTIFICIAL TEARS) 1 % ophthalmic solution Place 1 drop into both eyes 2 (two) times daily as needed (dry eyes).   Yes [provider]  metoprolol succinate (TOPROL-XL) 50 MG 24 hr tablet Take 1 tablet (50 mg total) by mouth daily. Patient taking differently: Take 25 mg by mouth daily.  11/09/17  Yes Baldwin Jamaica, PA-C  mirtazapine (REMERON) 30 MG tablet Take 1 tablet (30 mg total) by mouth at bedtime. 11/06/17  Yes Cloria [place], MD  Multiple Vitamin (MULTIVITAMIN) tablet Take 1 tablet by mouth daily.     Yes [provider]  nitroGLYCERIN (NITROSTAT) 0.4 MG SL tablet Place 1 tablet (0.4 mg total) under the tongue every 5 (five) minutes as needed for chest pain. 01/21/16 01/26/20 Yes Evans Lance, MD  Propylhexedrine [facility])  INHA Place 1 each into the nose 2 (two) times daily as needed (congestion).    Yes [provider]  rosuvastatin (CRESTOR) 40 MG tablet Take 1 tablet (40 mg total) by mouth daily. 11/09/17  Yes Baldwin Jamaica, PA-C  vitamin C (VITAMIN C) 1000 MG tablet Take 1 tablet (1,000 mg total) by mouth daily. 07/04/17  Yes Roxan Hockey, MD    Social History   Socioeconomic History  . Marital status: Divorced    Spouse name: Not on file  . Number of children: 0  . Years of education: college  . Highest education level: Not on file  Occupational History  . Occupation: Engineer, production: [employer]    Employer: [employer]  . Financial resource strain: Not on file  . Food insecurity:    Worry: Not on file    Inability: Not on file  . Transportation needs:    Medical: Not on file    Non-medical: Not on file  Tobacco Use  . Smoking status: Former Smoker    Types: Cigars  . Smokeless tobacco: Current User    Types: Snuff  . Tobacco comment: Occasional cigar  Substance and Sexual Activity  . Alcohol use: No    Alcohol/week: 0.0 standard drinks    Comment: rarely  . Drug use: No  . Sexual activity: Never  Lifestyle  . Physical activity:    Days per week: Not on file    Minutes per session: Not on file  . Stress: Not on file  Relationships  . Social connections:    Talks on phone: Not on file    Gets together: Not on file    Attends religious service: Not on file    Active member of club or organization: Not on file    Attends meetings of clubs or organizations: Not on file    Relationship status: Not on file  . Intimate partner violence:    Fear of current or ex partner: Not on file    Emotionally abused: Not on file    Physically abused: Not on file    Forced sexual activity: Not on file  Other Topics Concern  . Not on file  Social History Narrative  Patient drinks about 6 cups of caffeine daily.   Patient is left handed.     Family  History  Problem Relation Age of Onset  . Allergies Mother   . Diabetes Mother   . Hypertension Mother   . Hyperlipidemia Mother   . Dementia Mother   . Colon cancer Mother   . Lung cancer Mother   . Kidney cancer Mother   . Colon polyps Mother   . Stroke Father   . Allergies Father   . Dementia Father   . Alcohol abuse Father   . Colon polyps Father   . Diabetes Sister   . Heart disease Sister   . Anxiety disorder Sister   . Cancer Maternal Grandmother        unknown type  . Schizophrenia Paternal Uncle   . Schizophrenia Cousin     ROS: [x]  Positive   [ ]  Negative   [ ]  All sytems reviewed and are negative  Cardiac: [x]  heart failure [x]  AICD [x]  hx CABG  Vascular: []  pain in legs while walking []  pain in legs at rest []  pain in legs at night []  non-healing ulcers []  hx of DVT []  swelling in legs  Pulmonary: [x]  OSA  Neurologic: [x]  neuropathy   Hematologic: [x]  anemia   Endocrine:   [x]  diabetes   [x]  thyroid dz  GI [x]  hx pancreatitis  GU: [x]  CKD/renal failure []  HD--[]  M/W/F or []  T/T/S  Psychiatric: [x]  anxiety [x]  depression  Musculoskeletal: [x]  DDD [x]  hx CTS  Integumentary: [x]  scar right arm  Constitutional: []  fever []  chills   Physical Examination  Vitals:   02/24/18 2317 02/25/18 0801  BP: (!) 131/93 139/88  Pulse: 82 83  Resp: 18   Temp: 98.3 F (36.8 C) 97.8 F (36.6 C)  SpO2: 100% 99%   Body mass index is 40.17 kg/m.  General:  WDWN in NAD Gait: Not observed HENT: WNL, normocephalic Pulmonary: normal non-labored breathing, without Rales, rhonchi,  wheezing Cardiac: regular Skin: well healed incision on right arm ~ 5-7" from wrist proximally Vascular Exam/Pulses: Easily palpable right radial pulse; right cephalic vein present Extremities: without ischemic changes, without Gangrene , without cellulitis; without open wounds;  Musculoskeletal: no muscle wasting or atrophy  Neurologic: A&O X 3;  No focal  weakness or paresthesias are detected; speech is fluent/normal Psychiatric:  The pt has Normal affect.  CBC    Component Value Date/Time   WBC 8.9 02/25/2018 0250   RBC 3.05 (L) 02/25/2018 0250   HGB 7.9 (L) 02/25/2018 0250   HCT 25.9 (L) 02/25/2018 0250   PLT 125 (L) 02/25/2018 0250   MCV 84.9 02/25/2018 0250   MCH 25.9 (L) 02/25/2018 0250   MCHC 30.5 02/25/2018 0250   RDW 15.0 02/25/2018 0250   LYMPHSABS 0.8 02/25/2018 0250   MONOABS 0.8 02/25/2018 0250   EOSABS 0.2 02/25/2018 0250   BASOSABS 0.0 02/25/2018 0250    BMET    Component Value Date/Time   NA 137 02/25/2018 0250   K 3.6 02/25/2018 0250   CL 105 02/25/2018 0250   CO2 21 (L) 02/25/2018 0250   GLUCOSE 134 (H) 02/25/2018 0250   BUN 98 (H) 02/25/2018 0250   CREATININE 7.63 (H) 02/25/2018 0250   CREATININE 3.12 (H) 08/15/2017 1419   CALCIUM 8.4 (L) 02/25/2018 0250   GFRNONAA 7 (L) 02/25/2018 0250   GFRNONAA 22 (L) 08/15/2017 1419   GFRAA 8 (L) 02/25/2018 0250   GFRAA 25 (L) 08/15/2017 1419  COAGS: Lab Results  Component Value Date   INR 0.97 11/18/2010   INR 1.49 09/01/2009   INR 1.10 08/26/2009     Non-Invasive Vascular Imaging:   Vein mapping ordered  Statin:  Yes.   Beta Blocker:  Yes.   Aspirin:  Yes.   ACEI:  No. ARB:  No. CCB use:  No Other antiplatelets/anticoagulants:  No.    ASSESSMENT/PLAN: This is a 53 y.o. male with progressive renal insufficiency in setting of diabetes and hypertension in need of tunneled dialysis catheter and permanent dialysis access   -pt is right hand dominant, however, he has a PPM/AICD on the left.  He has had a necrotizing fascitis of the right forearm, wrist and hand that required extensive surgery by Dr. Amedeo Plenty in April of this year.  Upon Dr. Luther Parody exam of the pt, he has an easily palpable right radial pulse and cephalic vein that appears adequate for fistula.  Will get formal vein mapping today as we are planning for OR tomorrow for right arm fistula vs  graft and placement of tunneled dialysis catheter. -npo after MN/consent   Leontine Locket, PA-C [facility] [phone]  I have examined the patient, reviewed and agree with above.  Patient now agreeing to hemodialysis.  Explained options to include tunneled catheter, AV fistula and AV graft.  Easily palpable right radial pulse and does have large antecubital cephalic vein.  Discussed plan for tunneled catheter for acute dialysis and right arm AV fistula versus graft for long-term dialysis.  Plan surgery with Dr. Trula Slade tomorrow  Curt Jews, MD 02/25/2018 2:06 PM

## 2018-02-25 NOTE — Progress Notes (Signed)
Pt stated he wasn't sure if he wanted to wear his CPAP tonight, that if he decided to he would call. RT will continue to monitor as needed.

## 2018-02-25 NOTE — Progress Notes (Addendum)
Patient has pulled out his IV access. When RN spoke with patient about replacing and also about new orders that were placed for dialysis patient said he was not going to have dialysis. He stated he is feeling better and knows when he needs to have a treatment and he is fine at this time. He does not want to start something that he will have to go to treatment three days a week. Will page Dr. Justin Mend and notify of patients decision.   Dr. Justin Mend notified

## 2018-02-25 NOTE — Progress Notes (Signed)
   02/25/18 1100  Clinical Encounter Type  Visited With Patient  Visit Type Initial  Referral From Nurse  Consult/Referral To Chaplain  Spiritual Encounters  Spiritual Needs Other (Comment)  Stress Factors  Patient Stress Factors Exhausted   Responded to a spiritual care consult. PT was alert and sitting up in his chair. PT was open for conversation. I offered a brief overview of the AD that was requested. PT said he would look in over when he gets his glasses. I offered spiritual care with words of encouragement, and ministry of presence. Chaplain available as needed.  Chaplain Fidel Levy [phone]

## 2018-02-25 NOTE — Consult Note (Signed)
   [facility] CM Inpatient Consult   02/25/2018  Matthew [name] May 20, 1965 [id number]  Patient screened for high risk scores for unplanned readmissions.  Chart review reveals  Patient is in the ACO of the [place] [facility] services under patient's [employer]. Will follow up for needs. For questions contact:   Natividad Brood, RN BSN [facility] Liaison  [phone] business mobile phone Toll free office [phone]

## 2018-02-26 ENCOUNTER — Inpatient Hospital Stay (HOSPITAL_COMMUNITY): Payer: Medicare Other | Admitting: Certified Registered Nurse Anesthetist

## 2018-02-26 ENCOUNTER — Encounter (HOSPITAL_COMMUNITY): Payer: Self-pay | Admitting: Certified Registered Nurse Anesthetist

## 2018-02-26 ENCOUNTER — Encounter (HOSPITAL_COMMUNITY): Admission: EM | Disposition: E | Payer: Self-pay | Source: Home / Self Care | Attending: Internal Medicine

## 2018-02-26 ENCOUNTER — Telehealth: Payer: Self-pay | Admitting: *Deleted

## 2018-02-26 ENCOUNTER — Inpatient Hospital Stay (HOSPITAL_COMMUNITY): Payer: Medicare Other

## 2018-02-26 DIAGNOSIS — G4733 Obstructive sleep apnea (adult) (pediatric): Secondary | ICD-10-CM

## 2018-02-26 HISTORY — PX: INSERTION OF DIALYSIS CATHETER: SHX1324

## 2018-02-26 HISTORY — PX: AV FISTULA PLACEMENT: SHX1204

## 2018-02-26 LAB — RENAL FUNCTION PANEL
Albumin: 2.8 g/dL — ABNORMAL LOW (ref 3.5–5.0)
Anion gap: 16 — ABNORMAL HIGH (ref 5–15)
BUN: 99 mg/dL — ABNORMAL HIGH (ref 6–20)
CHLORIDE: 104 mmol/L (ref 98–111)
CO2: 19 mmol/L — ABNORMAL LOW (ref 22–32)
CREATININE: 7.63 mg/dL — AB (ref 0.61–1.24)
Calcium: 8.8 mg/dL — ABNORMAL LOW (ref 8.9–10.3)
GFR calc Af Amer: 8 mL/min — ABNORMAL LOW (ref >60)
GFR calc non Af Amer: 7 mL/min — ABNORMAL LOW (ref >60)
Glucose, Bld: 89 mg/dL (ref 70–99)
Phosphorus: 8.4 mg/dL — ABNORMAL HIGH (ref 2.5–4.6)
Potassium: 3.9 mmol/L (ref 3.5–5.1)
Sodium: 139 mmol/L (ref 135–145)

## 2018-02-26 LAB — CBC
HCT: 27.5 % — ABNORMAL LOW (ref 39.0–52.0)
Hemoglobin: 8.4 g/dL — ABNORMAL LOW (ref 13.0–17.0)
MCH: 25.8 pg — ABNORMAL LOW (ref 26.0–34.0)
MCHC: 30.5 g/dL (ref 30.0–36.0)
MCV: 84.6 fL (ref 80.0–100.0)
Platelets: 138 10*3/uL — ABNORMAL LOW (ref 150–400)
RBC: 3.25 MIL/uL — ABNORMAL LOW (ref 4.22–5.81)
RDW: 14.9 % (ref 11.5–15.5)
WBC: 8.2 10*3/uL (ref 4.0–10.5)
nRBC: 0 % (ref 0.0–0.2)

## 2018-02-26 LAB — PROTIME-INR
INR: 1.19
Prothrombin Time: 15 seconds (ref 11.4–15.2)

## 2018-02-26 LAB — GLUCOSE, CAPILLARY
Glucose-Capillary: 81 mg/dL (ref 70–99)
Glucose-Capillary: 114 mg/dL — ABNORMAL HIGH (ref 70–99)
Glucose-Capillary: 127 mg/dL — ABNORMAL HIGH (ref 70–99)
Glucose-Capillary: 171 mg/dL — ABNORMAL HIGH (ref 70–99)

## 2018-02-26 SURGERY — INSERTION OF DIALYSIS CATHETER
Anesthesia: Monitor Anesthesia Care | Site: Neck | Laterality: Right

## 2018-02-26 MED ORDER — LIDOCAINE-EPINEPHRINE (PF) 1 %-1:200000 IJ SOLN
INTRAMUSCULAR | Status: DC | PRN
  Administered 2018-02-26: 30 mL

## 2018-02-26 MED ORDER — FENTANYL CITRATE (PF) 100 MCG/2ML IJ SOLN: 25.0000 ug | INTRAMUSCULAR | Status: DC | PRN

## 2018-02-26 MED ORDER — PROPOFOL 10 MG/ML IV BOLUS
INTRAVENOUS | Status: DC | PRN
  Administered 2018-02-26: 200 mg via INTRAVENOUS

## 2018-02-26 MED ORDER — ONDANSETRON HCL 4 MG/2ML IJ SOLN: 4.0000 mg | Freq: Once | INTRAMUSCULAR | Status: DC | PRN

## 2018-02-26 MED ORDER — 0.9 % SODIUM CHLORIDE (POUR BTL) OPTIME
TOPICAL | Status: DC | PRN
  Administered 2018-02-26: 1000 mL

## 2018-02-26 MED ORDER — SUCCINYLCHOLINE CHLORIDE 200 MG/10ML IV SOSY
PREFILLED_SYRINGE | INTRAVENOUS | Status: AC
  Filled 2018-02-26: qty 10

## 2018-02-26 MED ORDER — LIDOCAINE 2% (20 MG/ML) 5 ML SYRINGE
INTRAMUSCULAR | Status: AC
  Filled 2018-02-26: qty 5

## 2018-02-26 MED ORDER — EPHEDRINE SULFATE 50 MG/ML IJ SOLN
INTRAMUSCULAR | Status: DC | PRN
  Administered 2018-02-26: 10 mg via INTRAVENOUS

## 2018-02-26 MED ORDER — ONDANSETRON HCL 4 MG/2ML IJ SOLN
INTRAMUSCULAR | Status: AC
  Filled 2018-02-26: qty 2

## 2018-02-26 MED ORDER — SODIUM CHLORIDE 0.9 % IV SOLN
INTRAVENOUS | Status: DC | PRN
  Administered 2018-02-26: 500 mL

## 2018-02-26 MED ORDER — HEPARIN SODIUM (PORCINE) 1000 UNIT/ML IJ SOLN
INTRAMUSCULAR | Status: DC | PRN
  Administered 2018-02-26: 1000 [IU]

## 2018-02-26 MED ORDER — FENTANYL CITRATE (PF) 250 MCG/5ML IJ SOLN
INTRAMUSCULAR | Status: AC
  Filled 2018-02-26: qty 5

## 2018-02-26 MED ORDER — PHENYLEPHRINE HCL 10 MG/ML IJ SOLN
INTRAMUSCULAR | Status: DC | PRN
  Administered 2018-02-26 (×2): 120 ug via INTRAVENOUS

## 2018-02-26 MED ORDER — HEMOSTATIC AGENTS (NO CHARGE) OPTIME
TOPICAL | Status: DC | PRN
  Administered 2018-02-26: 1 via TOPICAL

## 2018-02-26 MED ORDER — SODIUM CHLORIDE 0.9 % IV SOLN
INTRAVENOUS | Status: AC
  Filled 2018-02-26: qty 1.2

## 2018-02-26 MED ORDER — HEPARIN SODIUM (PORCINE) 1000 UNIT/ML IJ SOLN
INTRAMUSCULAR | Status: AC
  Filled 2018-02-26: qty 1

## 2018-02-26 MED ORDER — FENTANYL CITRATE (PF) 100 MCG/2ML IJ SOLN
INTRAMUSCULAR | Status: DC | PRN
  Administered 2018-02-26: 50 ug via INTRAVENOUS
  Administered 2018-02-26: 100 ug via INTRAVENOUS

## 2018-02-26 MED ORDER — PHENYLEPHRINE 40 MCG/ML (10ML) SYRINGE FOR IV PUSH (FOR BLOOD PRESSURE SUPPORT)
PREFILLED_SYRINGE | INTRAVENOUS | Status: AC
  Filled 2018-02-26: qty 10

## 2018-02-26 MED ORDER — SUCCINYLCHOLINE CHLORIDE 20 MG/ML IJ SOLN
INTRAMUSCULAR | Status: DC | PRN
  Administered 2018-02-26: 120 mg via INTRAVENOUS

## 2018-02-26 MED ORDER — IODIXANOL 320 MG/ML IV SOLN
INTRAVENOUS | Status: DC | PRN
  Administered 2018-02-26: 8 mL

## 2018-02-26 MED ORDER — SODIUM CHLORIDE 0.9 % IV SOLN
INTRAVENOUS | Status: DC | PRN
  Administered 2018-02-26: 50 ug/min via INTRAVENOUS

## 2018-02-26 MED ORDER — SODIUM CHLORIDE 0.9 % IV SOLN
INTRAVENOUS | Status: DC
  Administered 2018-02-26: 09:00:00 via INTRAVENOUS

## 2018-02-26 MED ORDER — PROPOFOL 10 MG/ML IV BOLUS
INTRAVENOUS | Status: AC
  Filled 2018-02-26: qty 20

## 2018-02-26 MED ORDER — HYDROCODONE-ACETAMINOPHEN 5-325 MG PO TABS: 1.0000 | ORAL_TABLET | ORAL | Status: DC | PRN

## 2018-02-26 MED ORDER — ONDANSETRON HCL 4 MG/2ML IJ SOLN
INTRAMUSCULAR | Status: DC | PRN
  Administered 2018-02-26: 4 mg via INTRAVENOUS

## 2018-02-26 MED ORDER — LIDOCAINE 2% (20 MG/ML) 5 ML SYRINGE
INTRAMUSCULAR | Status: DC | PRN
  Administered 2018-02-26: 80 mg via INTRAVENOUS

## 2018-02-26 SURGICAL SUPPLY — 58 items
ADH SKN CLS APL DERMABOND .7 (GAUZE/BANDAGES/DRESSINGS) ×4
ARMBAND PINK RESTRICT EXTREMIT (MISCELLANEOUS) ×8 IMPLANT
BAG DECANTER FOR FLEXI CONT (MISCELLANEOUS) ×4 IMPLANT
BIOPATCH RED 1 DISK 7.0 (GAUZE/BANDAGES/DRESSINGS) ×3 IMPLANT
BIOPATCH RED 1IN DISK 7.0MM (GAUZE/BANDAGES/DRESSINGS) ×1
CANISTER SUCT 3000ML PPV (MISCELLANEOUS) ×4 IMPLANT
CATH BEACON 5 .035 65 KMP TIP (CATHETERS) ×2 IMPLANT
CATH PALINDROME RT-P 15FX19CM (CATHETERS) IMPLANT
CATH PALINDROME RT-P 15FX23CM (CATHETERS) IMPLANT
CATH PALINDROME RT-P 15FX28CM (CATHETERS) ×2 IMPLANT
CATH PALINDROME RT-P 15FX55CM (CATHETERS) IMPLANT
CLIP VESOCCLUDE MED 6/CT (CLIP) ×4 IMPLANT
CLIP VESOCCLUDE SM WIDE 6/CT (CLIP) ×4 IMPLANT
COVER PROBE W GEL 5X96 (DRAPES) ×4 IMPLANT
COVER SURGICAL LIGHT HANDLE (MISCELLANEOUS) ×4 IMPLANT
COVER WAND RF STERILE (DRAPES) ×4 IMPLANT
DERMABOND ADVANCED (GAUZE/BANDAGES/DRESSINGS) ×4
DERMABOND ADVANCED .7 DNX12 (GAUZE/BANDAGES/DRESSINGS) ×2 IMPLANT
DRAPE C-ARM 42X72 X-RAY (DRAPES) ×4 IMPLANT
DRAPE CHEST BREAST 15X10 FENES (DRAPES) ×4 IMPLANT
DRSG COVADERM 4X6 (GAUZE/BANDAGES/DRESSINGS) ×2 IMPLANT
ELECT REM PT RETURN 9FT ADLT (ELECTROSURGICAL) ×4
ELECTRODE REM PT RTRN 9FT ADLT (ELECTROSURGICAL) ×2 IMPLANT
GAUZE 4X4 16PLY RFD (DISPOSABLE) ×4 IMPLANT
GLOVE BIO SURGEON STRL SZ7.5 (GLOVE) ×4 IMPLANT
GLOVE BIOGEL PI IND STRL 7.5 (GLOVE) ×2 IMPLANT
GLOVE BIOGEL PI INDICATOR 7.5 (GLOVE) ×2
GLOVE SURG SS PI 7.5 STRL IVOR (GLOVE) ×4 IMPLANT
GOWN STRL REUS W/ TWL LRG LVL3 (GOWN DISPOSABLE) ×4 IMPLANT
GOWN STRL REUS W/ TWL XL LVL3 (GOWN DISPOSABLE) ×2 IMPLANT
GOWN STRL REUS W/TWL LRG LVL3 (GOWN DISPOSABLE) ×8
GOWN STRL REUS W/TWL XL LVL3 (GOWN DISPOSABLE) ×12
HEMOSTAT SNOW SURGICEL 2X4 (HEMOSTASIS) ×2 IMPLANT
KIT BASIN OR (CUSTOM PROCEDURE TRAY) ×4 IMPLANT
KIT TURNOVER KIT B (KITS) ×4 IMPLANT
NDL 18GX1X1/2 (RX/OR ONLY) (NEEDLE) ×2 IMPLANT
NDL HYPO 25GX1X1/2 BEV (NEEDLE) ×2 IMPLANT
NEEDLE 18GX1X1/2 (RX/OR ONLY) (NEEDLE) ×4 IMPLANT
NEEDLE HYPO 25GX1X1/2 BEV (NEEDLE) ×4 IMPLANT
NS IRRIG 1000ML POUR BTL (IV SOLUTION) ×4 IMPLANT
PACK CV ACCESS (CUSTOM PROCEDURE TRAY) ×4 IMPLANT
PACK SURGICAL SETUP 50X90 (CUSTOM PROCEDURE TRAY) ×4 IMPLANT
PAD ARMBOARD 7.5X6 YLW CONV (MISCELLANEOUS) ×8 IMPLANT
SOAP 2 % CHG 4 OZ (WOUND CARE) ×4 IMPLANT
SUT ETHILON 3 0 PS 1 (SUTURE) ×4 IMPLANT
SUT PROLENE 6 0 CC (SUTURE) ×4 IMPLANT
SUT VIC AB 3-0 SH 27 (SUTURE) ×8
SUT VIC AB 3-0 SH 27X BRD (SUTURE) ×2 IMPLANT
SUT VICRYL 4-0 PS2 18IN ABS (SUTURE) ×4 IMPLANT
SYR 10ML LL (SYRINGE) ×4 IMPLANT
SYR 20CC LL (SYRINGE) ×8 IMPLANT
SYR 5ML LL (SYRINGE) ×4 IMPLANT
SYR CONTROL 10ML LL (SYRINGE) ×4 IMPLANT
TOWEL GREEN STERILE (TOWEL DISPOSABLE) ×8 IMPLANT
TOWEL GREEN STERILE FF (TOWEL DISPOSABLE) ×4 IMPLANT
UNDERPAD 30X30 (UNDERPADS AND DIAPERS) ×4 IMPLANT
WATER STERILE IRR 1000ML POUR (IV SOLUTION) ×4 IMPLANT
WIRE BENTSON .035X145CM (WIRE) ×2 IMPLANT

## 2018-02-26 NOTE — Anesthesia Preprocedure Evaluation (Addendum)
Anesthesia Evaluation  Patient identified by MRN, date of birth, ID band Patient awake    Reviewed: Allergy & Precautions, NPO status , Patient's Chart, lab work & pertinent test results  Airway Mallampati: II  TM Distance: >3 FB Neck ROM: Full    Dental  (+) Missing, Chipped,    Pulmonary sleep apnea , former smoker,    Pulmonary exam normal breath sounds clear to auscultation       Cardiovascular hypertension, + CAD, + Past MI, + CABG and +CHF  Normal cardiovascular exam+ pacemaker + Cardiac Defibrillator [facility])  Rhythm:Regular Rate:Normal  ECHO: Compared to prior studies, the LVEF is higher at 45-50% with global hypokinesis and incoordinate septal motion. Pacer or AICD wires are noted. There is grade 2 DD with elevated LV filling pressure and a dilated IVC.    Neuro/Psych  Headaches, PSYCHIATRIC DISORDERS Anxiety Depression Foot drop, bilateral  Neuromuscular disease    GI/Hepatic negative GI ROS, Neg liver ROS,   Endo/Other  diabetes, Insulin DependentMorbid obesity  Renal/GU ESRFRenal disease     Musculoskeletal negative musculoskeletal ROS (+)   Abdominal (+) + obese,   Peds  Hematology  (+) anemia , HLD   Anesthesia Other Findings END STAGE RENAL DISEASE FOR HEMODIALYSIS ACCESS  Reproductive/Obstetrics                            Anesthesia Physical Anesthesia Plan  ASA: IV  Anesthesia Plan: MAC   Post-op Pain Management:    Induction: Intravenous  PONV Risk Score and Plan: 1 and Propofol infusion and Treatment may vary due to age or medical condition  Airway Management Planned: Simple Face Mask  Additional Equipment:   Intra-op Plan:   Post-operative Plan:   Informed Consent: I have reviewed the patients History and Physical, chart, labs and discussed the procedure including the risks, benefits and alternatives for the proposed anesthesia with the patient  or authorized representative who has indicated his/her understanding and acceptance.   Dental advisory given  Plan Discussed with: CRNA  Anesthesia Plan Comments:        Anesthesia Quick Evaluation

## 2018-02-26 NOTE — Anesthesia Postprocedure Evaluation (Signed)
Anesthesia Post Note  Patient: [place] Marchiano  Procedure(s) Performed: INSERTION TUNNELED  OF Right Internal jugular DIALYSIS CATHETER (N/A Neck) Creation of Right arm Brachiocephalic Fistula (Right Arm Lower)     Patient location during evaluation: PACU Anesthesia Type: General Level of consciousness: awake and alert Pain management: pain level controlled Vital Signs Assessment: post-procedure vital signs reviewed and stable Respiratory status: spontaneous breathing, nonlabored ventilation, respiratory function stable and patient connected to nasal cannula oxygen Cardiovascular status: blood pressure returned to baseline and stable Postop Assessment: no apparent nausea or vomiting Anesthetic complications: no    Last Vitals:  Vitals:   02/21/2018 1330 03/04/2018 1400  BP: (P) 125/72 (P) 116/82  Pulse: (P) 70 (P) 75  Resp:    Temp:    SpO2:      Last Pain:  Vitals:   02/09/2018 1254  TempSrc: (P) Oral  PainSc:                  Karyl Kinnier Ares Cardozo

## 2018-02-26 NOTE — Interval H&P Note (Signed)
History and Physical Interval Note:  02/25/2018 8:38 AM  Matthew [name]  has presented today for surgery, with the diagnosis of END STAGE RENAL DISEASE FOR HEMODIALYSIS ACCESS  The various methods of treatment have been discussed with the patient and family. After consideration of risks, benefits and other options for treatment, the patient has consented to  Procedure(s): INSERTION TUNNELED  OF DIALYSIS CATHETER (N/A) ARTERIOVENOUS (AV) FISTULA CREATION VERSUS INSERTION OF ARTERIOVENOUS GRAFT RIGHT ARM (Right) as a surgical intervention .  The patient's history has been reviewed, patient examined, no change in status, stable for surgery.  I have reviewed the patient's chart and labs.  Questions were answered to the patient's satisfaction.     Annamarie Major

## 2018-02-26 NOTE — Progress Notes (Signed)
Call to [employer] Heron Sabins), updated on pt, magnet used in OR & removed as documented. No need to interrogate per rep.

## 2018-02-26 NOTE — Progress Notes (Signed)
Partials picked up by [facility].

## 2018-02-26 NOTE — Anesthesia Procedure Notes (Signed)
Procedure Name: Intubation Date/Time: 02/24/2018 9:27 AM Performed by: Inda Coke, CRNA Pre-anesthesia Checklist: Patient identified, Emergency Drugs available, Suction available and Patient being monitored Patient Re-evaluated:Patient Re-evaluated prior to induction Oxygen Delivery Method: Circle System Utilized Preoxygenation: Pre-oxygenation with 100% oxygen Induction Type: IV induction Ventilation: Mask ventilation without difficulty and Oral airway inserted - appropriate to patient size Laryngoscope Size: Mac and 4 Grade View: Grade I Tube type: Oral Tube size: 7.5 mm Number of attempts: 1 Airway Equipment and Method: Stylet and Oral airway Placement Confirmation: ETT inserted through vocal cords under direct vision,  positive ETCO2 and breath sounds checked- equal and bilateral Secured at: 22 cm Tube secured with: Tape Dental Injury: Teeth and Oropharynx as per pre-operative assessment

## 2018-02-26 NOTE — Progress Notes (Signed)
Dr. Roanna Banning notified that patient has a [place]. Dr. Roanna Banning requested we contact device representative. Contacted Joey and transferred him to Dr. Roanna Banning.

## 2018-02-26 NOTE — Telephone Encounter (Signed)
Called results lmtcb. 

## 2018-02-26 NOTE — Progress Notes (Signed)
Pt returned from PACU post HD cath and fistula placement.  Was immediately picked up by transport for dialysis.  Will monitor when pt returns from HD.

## 2018-02-26 NOTE — Progress Notes (Signed)
[facility] PROGRESS NOTE  Layn Kye AYT:[id number] DOB: 24-Jul-1965 DOA: 02/06/2018  PCP: [name], MD  Brief History/Interval Summary: 52-year-old male with a past medical history of coronary artery disease status post bypass, history of cardiac arrest status post pacemaker/AICD, chronic kidney disease stage IV, diabetes mellitus type 2, hyperlipidemia, history of obstructive sleep apnea recently diagnosed hospitalized in April for hand cellulitis presented with somnolence falls patient was noted to have worsening renal function with a creatinine up to 7.4 from his baseline of 3.1.  He was also found to have urinary retention.  He was hospitalized for further management.     Reason for Visit: Acute renal failure.  Consultants: Nephrology  Procedures: None  Antibiotics: None  Subjective/Interval History: Patient seen at hemodialysis.  He was at surgery this morning.  Patient denies any complaints at this time.  Able to urinate on his own.  ROS: Denies any shortness of breath  Objective:  Vital Signs  Vitals:   03/05/2018 1256 03/01/2018 1300 03/04/2018 1330 02/18/2018 1400  BP: (P) 116/73 (P) 127/73 (P) 125/72 (P) 116/82  Pulse: (P) 73 (P) 72 (P) 70 (P) 75  Resp:      Temp:      TempSrc:      SpO2:      Weight:      Height:        Intake/Output Summary (Last 24 hours) at 02/25/2018 1412 Last data filed at 02/06/2018 1050 Gross per 24 hour  Intake 1748 ml  Output 865 ml  Net 883 ml   Filed Weights   02/23/18 0500 02/24/18 0600 02/18/2018 0500  Weight: 121.5 kg 123.4 kg 125.6 kg    General appearance: Awake alert.  In no distress Resp: Normal effort.  Clear to auscultation bilaterally Cardio: S1-S2 is normal regular.  No S3-S4.  No rubs murmurs or bruit GI: Abdomen is soft.  Nontender nondistended Extremities: Edema is noted bilateral lower extremity Neurologic: No focal neurological deficits.  Lab Results:  Data Reviewed: I have personally  reviewed following labs and imaging studies  CBC: Recent Labs  Lab 02/12/2018 2330 02/23/18 0003 02/24/18 0259 02/25/18 0250 02/11/2018 0251  WBC 11.2*  --  8.3 8.9 8.2  NEUTROABS 9.2*  --  6.4 7.1  --   HGB 8.5* 8.5* 7.8* 7.9* 8.4*  HCT 28.2* 25.0* 25.2* 25.9* 27.5*  MCV 86.5  --  86.3 84.9 84.6  PLT 124*  --  117* 125* 138*    Basic Metabolic Panel: Recent Labs  Lab 03/05/2018 2330 02/23/18 0003 02/23/18 0554 02/24/18 0259 02/25/18 0250 02/19/2018 0251  NA 139 139 141 138 137 139  K 3.7 3.7 3.8 3.6 3.6 3.9  CL 104 106 107 105 105 104  CO2 16*  --  18* 16* 21* 19*  GLUCOSE 132* 126* 122* 168* 134* 89  BUN 83* 92* 85* 96* 98* 99*  CREATININE 7.49* 7.90* 7.58* 7.74* 7.63* 7.63*  CALCIUM 8.8*  --  8.9 8.3* 8.4* 8.8*  PHOS  --   --   --  9.6* 8.7* 8.4*    GFR: Estimated Creatinine Clearance: 14.5 mL/min (A) (by C-G formula based on SCr of 7.63 mg/dL (H)).  Liver Function Tests: Recent Labs  Lab 03/05/2018 2330 02/23/18 0554 02/24/18 0259 02/25/18 0250 02/10/2018 0251  AST 108* 96*  --   --   --   ALT 49* 52*  --   --   --   ALKPHOS 91 94  --   --   --  BILITOT 1.0 0.9  --   --   --   PROT 6.4* 6.3*  --   --   --   ALBUMIN 3.0* 2.8* 2.4* 2.6* 2.8*     Recent Labs  Lab 02/23/18 0006  AMMONIA 24    CBG: Recent Labs  Lab 02/25/18 1127 02/25/18 1642 02/25/18 2157 02/17/2018 0815 03/04/2018 1118  GLUCAP 146* 121* 125* 81 114*    Anemia Panel: Recent Labs    02/24/18 1121  VITAMINB12 1,135*  FOLATE 26.1  FERRITIN 110  TIBC 207*  IRON 33*  RETICCTPCT 1.7     Radiology Studies: Dg Chest Port 1 View  Result Date: 02/15/2018 CLINICAL DATA:  Dialysis catheter placement. EXAM: PORTABLE CHEST 1 VIEW COMPARISON:  Chest x-ray dated February 23, 2018. FINDINGS: Interval placement of a tunneled right internal jugular dialysis catheter with the tip in the distal SVC. Unchanged left chest wall pacemaker. Stable cardiomegaly status post CABG. Mild pulmonary  vascular congestion. No focal consolidation, pleural effusion, or pneumothorax. No acute osseous abnormality. IMPRESSION: 1. Tunneled right internal jugular dialysis catheter with tip in the distal SVC. No pneumothorax. 2. Stable cardiomegaly.  Mild pulmonary vascular congestion. Electronically Signed   By: Titus Dubin M.D.   On: 02/25/2018 11:49   Dg Fluoro Guide Cv Line Right  Result Date: 03/04/2018 CLINICAL DATA:  Intraoperative imaging during dialysis catheter insertion. EXAM: CENTRAL VENOUS CATHETER WITH FLUOROSCOPY CONTRAST:  See operative report FLUOROSCOPY TIME:  Fluoroscopy Time:  2 minutes 33 seconds Radiation Exposure Index (if provided by the fluoroscopic device): None available Number of Acquired Spot Images: Subtraction imaging performed. COMPARISON:  None. FINDINGS: Intraoperative digital subtraction imaging performed of the chest. This demonstrates catheter within the SVC. Contrast injection confirms patency of the SVC. No central venous occlusion. IMPRESSION: Patent SVC. Electronically Signed   By: Jerilynn Mages.  Shick M.D.   On: 02/23/2018 10:23   Vas [place] Upper Ext Vein Mapping (pre-op Avf)  Result Date: 02/13/2018 UPPER EXTREMITY VEIN MAPPING  Indications: Pre-access. History: CKD.  Limitations: patient cannot keep still during the exam Comparison Study: No prior study available Performing Technologist: Rudell Cobb  Examination Guidelines: A complete evaluation includes B-mode imaging, spectral Doppler, color Doppler, and power Doppler as needed of all accessible portions of each vessel. Bilateral testing is considered an integral part of a complete examination. Limited examinations for reoccurring indications may be performed as noted. +-----------------+-------------+----------+----------------------------------+ Right Cephalic   Diameter (cm)Depth (cm)             Findings              +-----------------+-------------+----------+----------------------------------+ Shoulder              0.49        1.28                                      +-----------------+-------------+----------+----------------------------------+ Prox upper arm       0.40        1.07               branching              +-----------------+-------------+----------+----------------------------------+ Mid upper arm        0.42        1.05   branching and Partially thrombosed +-----------------+-------------+----------+----------------------------------+ Dist upper arm       0.45        0.66  branching              +-----------------+-------------+----------+----------------------------------+ Antecubital fossa    0.26        0.70               branching              +-----------------+-------------+----------+----------------------------------+ Prox forearm         0.26        0.49                                      +-----------------+-------------+----------+----------------------------------+ Mid forearm          0.27        0.57                                      +-----------------+-------------+----------+----------------------------------+ Dist forearm         0.35        0.35        branching and thrombosed      +-----------------+-------------+----------+----------------------------------+ Wrist                                             not visualized           +-----------------+-------------+----------+----------------------------------+ +-----------------+-------------+----------+--------------+ Right Basilic    Diameter (cm)Depth (cm)   Findings    +-----------------+-------------+----------+--------------+ Shoulder                                not visualized +-----------------+-------------+----------+--------------+ Prox upper arm       0.23        0.90                  +-----------------+-------------+----------+--------------+ Mid upper arm        0.22        0.45                   +-----------------+-------------+----------+--------------+ Dist upper arm       0.19        0.29                  +-----------------+-------------+----------+--------------+ Antecubital fossa    0.21        0.24     branching    +-----------------+-------------+----------+--------------+ Prox forearm         0.41        0.39     branching    +-----------------+-------------+----------+--------------+ Mid forearm          0.27        0.44     branching    +-----------------+-------------+----------+--------------+ Distal forearm       0.28        0.35     branching    +-----------------+-------------+----------+--------------+ Elbow                                   not visualized +-----------------+-------------+----------+--------------+ Wrist  not visualized +-----------------+-------------+----------+--------------+ *See table(s) above for measurements and observations.  Diagnosing physician: Harold Barban MD Electronically signed by Harold Barban MD on 03/03/2018 at 10:57:16 AM.    Final      Medications:  Scheduled: . [MAR Hold] aspirin EC  81 mg Oral Daily  . [MAR Hold] busPIRone  10 mg Oral BID  . [MAR Hold] calcitRIOL  0.5 mcg Oral Daily  . [MAR Hold] calcium acetate  1,334 mg Oral TID WC  . [MAR Hold] Chlorhexidine Gluconate Cloth  6 each Topical Q0600  . [MAR Hold] darbepoetin (ARANESP) injection - NON-DIALYSIS  150 mcg Subcutaneous Q Mon-1800  . [MAR Hold] FLUoxetine  20 mg Oral Daily  . [MAR Hold] heparin  5,000 Units Subcutaneous Q8H  . [MAR Hold] insulin aspart  0-5 Units Subcutaneous QHS  . [MAR Hold] insulin aspart  0-9 Units Subcutaneous TID WC  . [MAR Hold] insulin glargine  10 Units Subcutaneous BID  . [MAR Hold] metoprolol succinate  25 mg Oral Daily  . [MAR Hold] rosuvastatin  10 mg Oral q1800  . [MAR Hold] sodium bicarbonate  650 mg Oral TID  . [MAR Hold] sodium chloride flush  3 mL Intravenous Q12H  .  [MAR Hold] tamsulosin  0.4 mg Oral Daily   Continuous: . [MAR Hold] sodium chloride    . sodium chloride 50 mL/hr at 02/25/18 2227  . sodium chloride 10 mL/hr at 02/20/2018 0846  . [MAR Hold] ferric gluconate (FERRLECIT/NULECIT) IV 250 mg (02/24/18 1553)   PRN:[MAR Hold] sodium chloride, [MAR Hold] acetaminophen **OR** [MAR Hold] acetaminophen, fentaNYL (SUBLIMAZE) injection, [MAR Hold] ondansetron **OR** [MAR Hold] ondansetron (ZOFRAN) IV, ondansetron (ZOFRAN) IV, [MAR Hold] sodium chloride, [MAR Hold] sodium chloride flush    Assessment/Plan:  Acute renal failure superimposed on chronic kidney disease stage IV Initially thought to be due to urinary retention but there was no improvement despite Foley catheter placement.  Stated this appears to be an intrinsic renal failure.  Patient seen by nephrology.  No improvement in renal function since admission.  Dialysis recommended by nephrology.  Patient initially refused but then after further discussions he was agreeable.  Vascular surgery consulted.  Tunneled access placed this morning.  AV fistula created in the right arm.  Patient undergoing dialysis currently which he seems to be tolerating well.  Next dialysis will be on Thursday.   Urinary retention Patient found to have urinary retention at the time of admission.  Foley catheter was placed.  He has had good urine output.  Foley catheter was discontinued yesterday.  He has been able to void on his own.  Continue tamsulosin.   Acute metabolic encephalopathy Most likely this is due to his uremia.  Imaging studies unremarkable.  Patient does not have any neurological deficits.  Mentation appears to have improved.  Chronic combined systolic and diastolic CHF/AICD EF noted to be 45 to 50% based on echo done in May 2019.  Previously it has been lower.  Patient is status post AICD.  Stable from a volume standpoint.  Insulin-dependent diabetes mellitus HbA1c 9.3 in April.  Managed at home with  [place].  On a lower dose of Lantus here in the hospital.  Continue to monitor CBGs.    Anemia of chronic kidney disease No evidence of overt bleeding.  Hemoglobin is stable.  History of depression and anxiety Continue home medications.  History of coronary artery disease status post CABG Stable.  Morbid obesity BMI 40  Obstructive sleep apnea Patient tells that he underwent  sleep study recently.  Continue CPAP here in the hospital.  Progress note from 10/12 noted.  Found to have severe OSA with AHI of 62.1/h.  Patient actually was recommended BiPAP due to suboptimal CPAP titration.  Pressure injury Stage II - Partial thickness loss of dermis presenting as a shallow open ulcer with a red, pink wound bed without slough.Sacrum, posterior, medial.  Wound care.   DVT Prophylaxis: Subcutaneous heparin    Code Status: Full code Family Communication: Discussed with the patient Disposition Plan: Management as outlined above.  Hemodialysis today and then Thursday.  He will need to be established in one of the dialysis clinics before he can be discharged.    LOS: 2 days   [facility] Pager [phone] 03/04/2018, 2:12 PM  If 7PM-7AM, please contact night-coverage at [email or web address], password [facility]

## 2018-02-26 NOTE — Progress Notes (Signed)
[facility] ROUNDING NOTE   Subjective:   Chronically ill nondistressed gentleman this morning.  Prepared to undergo placement of fistula and catheter  Blood pressure 132/82 pulse 85 temperature 97.6 O2 sats 99% room air  Urine output 1.8 L12/23/2019  Sodium 139 potassium 3.9 chloride 104 CO2 19 glucose 89 BUN 99 creatinine 7.63 calcium 8.8 phosphorus 8.4 albumin 2.8 WBC 8.2 hemoglobin 8.4 platelets 138  Objective:  Vital signs in last 24 hours:  Temp:  [97.6 F (36.4 C)-98.2 F (36.8 C)] 97.6 F (36.4 C) (12/24 0812) Pulse Rate:  [68-85] 85 (12/24 0812) Resp:  [18] 18 (12/24 0812) BP: (117-132)/(67-82) 132/82 (12/24 0812) SpO2:  [95 %-99 %] 99 % (12/24 0812) Weight:  [125.6 kg] 125.6 kg (12/24 0500)  Weight change:  Filed Weights   02/23/18 0500 02/24/18 0600 02/28/2018 0500  Weight: 121.5 kg 123.4 kg 125.6 kg    Intake/Output: I/O last 3 completed shifts: In: 2670.2 [P.O.:1200; I.V.:1470.2] Out: 2176 [Urine:2175; Stool:1]   Intake/Output this shift:  No intake/output data recorded.  Chronically ill-appearing multiple abrasions CVS- RRR murmurs rubs gallops JVP not elevated RS- CTA clear to auscultation ABD- BS present soft non-distended obese abdomen EXT-1+ edema   Basic Metabolic Panel: Recent Labs  Lab 02/07/2018 2330 02/23/18 0003 02/23/18 0554 02/24/18 0259 02/25/18 0250 03/04/2018 0251  NA 139 139 141 138 137 139  K 3.7 3.7 3.8 3.6 3.6 3.9  CL 104 106 107 105 105 104  CO2 16*  --  18* 16* 21* 19*  GLUCOSE 132* 126* 122* 168* 134* 89  BUN 83* 92* 85* 96* 98* 99*  CREATININE 7.49* 7.90* 7.58* 7.74* 7.63* 7.63*  CALCIUM 8.8*  --  8.9 8.3* 8.4* 8.8*  PHOS  --   --   --  9.6* 8.7* 8.4*    Liver Function Tests: Recent Labs  Lab 02/16/2018 2330 02/23/18 0554 02/24/18 0259 02/25/18 0250 02/28/2018 0251  AST 108* 96*  --   --   --   ALT 49* 52*  --   --   --   ALKPHOS 91 94  --   --   --   BILITOT 1.0 0.9  --   --   --   PROT 6.4* 6.3*   --   --   --   ALBUMIN 3.0* 2.8* 2.4* 2.6* 2.8*   No results for input(s): LIPASE, AMYLASE in the last 168 hours. Recent Labs  Lab 02/23/18 0006  AMMONIA 24    CBC: Recent Labs  Lab 02/21/2018 2330 02/23/18 0003 02/24/18 0259 02/25/18 0250 02/04/2018 0251  WBC 11.2*  --  8.3 8.9 8.2  NEUTROABS 9.2*  --  6.4 7.1  --   HGB 8.5* 8.5* 7.8* 7.9* 8.4*  HCT 28.2* 25.0* 25.2* 25.9* 27.5*  MCV 86.5  --  86.3 84.9 84.6  PLT 124*  --  117* 125* 138*    Cardiac Enzymes: No results for input(s): CKTOTAL, CKMB, CKMBINDEX, TROPONINI in the last 168 hours.  BNP: Invalid input(s): POCBNP  CBG: Recent Labs  Lab 02/25/18 0757 02/25/18 1127 02/25/18 1642 02/25/18 2157 02/12/2018 0815  GLUCAP 128* 146* 121* 125* 81    Microbiology: Results for orders placed or performed during the hospital encounter of 07/10/17  Culture, blood (Routine X 2) w Reflex to ID Panel     Status: None   Collection Time: 07/10/17 11:30 PM  Result Value Ref Range Status   Specimen Description BLOOD LEFT ANTECUBITAL  Final   Special Requests  Final    BOTTLES DRAWN AEROBIC AND ANAEROBIC Blood Culture adequate volume   Culture   Final    NO GROWTH 5 DAYS Performed at [facility], [place] [address], [place], [place] [address]    Report Status 07/16/2017 FINAL  Final  Culture, blood (Routine X 2) w Reflex to ID Panel     Status: None   Collection Time: 07/11/17  1:31 AM  Result Value Ref Range Status   Specimen Description BLOOD LEFT ARM  Final   Special Requests   Final    BOTTLES DRAWN AEROBIC AND ANAEROBIC Blood Culture adequate volume   Culture   Final    NO GROWTH 5 DAYS Performed at [facility], [place] [address], [place], [place] [address]    Report Status 07/16/2017 FINAL  Final    Coagulation Studies: Recent Labs    02/18/2018 0251  LABPROT 15.0  INR 1.19    Urinalysis: No results for input(s): COLORURINE, LABSPEC, PHURINE, GLUCOSEU, HGBUR, BILIRUBINUR, KETONESUR, PROTEINUR,  UROBILINOGEN, NITRITE, LEUKOCYTESUR in the last 72 hours.  Invalid input(s): APPERANCEUR    Imaging: Vas [place] Upper Ext Vein Mapping (pre-op Avf)  Result Date: 02/25/2018 UPPER EXTREMITY VEIN MAPPING  Indications: Pre-access. History: CKD.  Limitations: patient cannot keep still during the exam Comparison Study: No prior study available Performing Technologist: Rudell Cobb  Examination Guidelines: A complete evaluation includes B-mode imaging, spectral Doppler, color Doppler, and power Doppler as needed of all accessible portions of each vessel. Bilateral testing is considered an integral part of a complete examination. Limited examinations for reoccurring indications may be performed as noted. +-----------------+-------------+----------+----------------------------------+ Right Cephalic   Diameter (cm)Depth (cm)             Findings              +-----------------+-------------+----------+----------------------------------+ Shoulder             0.49        1.28                                      +-----------------+-------------+----------+----------------------------------+ Prox upper arm       0.40        1.07               branching              +-----------------+-------------+----------+----------------------------------+ Mid upper arm        0.42        1.05   branching and Partially thrombosed +-----------------+-------------+----------+----------------------------------+ Dist upper arm       0.45        0.66               branching              +-----------------+-------------+----------+----------------------------------+ Antecubital fossa    0.26        0.70               branching              +-----------------+-------------+----------+----------------------------------+ Prox forearm         0.26        0.49                                      +-----------------+-------------+----------+----------------------------------+ Mid forearm  0.27         0.57                                      +-----------------+-------------+----------+----------------------------------+ Dist forearm         0.35        0.35        branching and thrombosed      +-----------------+-------------+----------+----------------------------------+ Wrist                                             not visualized           +-----------------+-------------+----------+----------------------------------+ +-----------------+-------------+----------+--------------+ Right Basilic    Diameter (cm)Depth (cm)   Findings    +-----------------+-------------+----------+--------------+ Shoulder                                not visualized +-----------------+-------------+----------+--------------+ Prox upper arm       0.23        0.90                  +-----------------+-------------+----------+--------------+ Mid upper arm        0.22        0.45                  +-----------------+-------------+----------+--------------+ Dist upper arm       0.19        0.29                  +-----------------+-------------+----------+--------------+ Antecubital fossa    0.21        0.24     branching    +-----------------+-------------+----------+--------------+ Prox forearm         0.41        0.39     branching    +-----------------+-------------+----------+--------------+ Mid forearm          0.27        0.44     branching    +-----------------+-------------+----------+--------------+ Distal forearm       0.28        0.35     branching    +-----------------+-------------+----------+--------------+ Elbow                                   not visualized +-----------------+-------------+----------+--------------+ Wrist                                   not visualized +-----------------+-------------+----------+--------------+ *See table(s) above for measurements and observations.  Diagnosing physician:    Preliminary      Medications:   . [MAR  Hold] sodium chloride    . sodium chloride 50 mL/hr at 02/25/18 2227  . sodium chloride 10 mL/hr at 02/05/2018 0846  . [MAR Hold] ferric gluconate (FERRLECIT/NULECIT) IV 250 mg (02/24/18 1553)  . [MAR Hold] vancomycin     . [MAR Hold] aspirin EC  81 mg Oral Daily  . [MAR Hold] busPIRone  10 mg Oral BID  . [MAR Hold] calcitRIOL  0.5 mcg Oral Daily  . [MAR Hold] calcium acetate  1,334 mg Oral TID WC  . [MAR Hold] Chlorhexidine Gluconate  Cloth  6 each Topical [id number]  . [MAR Hold] darbepoetin (ARANESP) injection - NON-DIALYSIS  150 mcg Subcutaneous Q Mon-1800  . [MAR Hold] FLUoxetine  20 mg Oral Daily  . [MAR Hold] heparin  5,000 Units Subcutaneous Q8H  . [MAR Hold] insulin aspart  0-5 Units Subcutaneous QHS  . [MAR Hold] insulin aspart  0-9 Units Subcutaneous TID WC  . [MAR Hold] insulin glargine  10 Units Subcutaneous BID  . [MAR Hold] metoprolol succinate  25 mg Oral Daily  . [MAR Hold] rosuvastatin  10 mg Oral q1800  . [MAR Hold] sodium bicarbonate  650 mg Oral TID  . [MAR Hold] sodium chloride flush  3 mL Intravenous Q12H  . [MAR Hold] tamsulosin  0.4 mg Oral Daily   [MAR Hold] sodium chloride, 0.9 % irrigation (POUR BTL), [MAR Hold] acetaminophen **OR** [MAR Hold] acetaminophen, heparin irrigation 6000 unit, heparin, lidocaine-EPINEPHrine, [MAR Hold] ondansetron **OR** [MAR Hold] ondansetron (ZOFRAN) IV, [MAR Hold] sodium chloride, [MAR Hold] sodium chloride flush  Assessment/ Plan:   Progressive renal insufficiency secondary to diabetes/hypertension.  Foley catheter placed with no improvement in renal function GFR less than 10 cc/min.  I suspect that we will need to initiate dialysis if he is agreeable.  Renal ultrasound 10.7 cm kidney on the right 12.1 cm kidney on left no hydronephrosis mild cortical thinning.  Appreciate VVS for fistula and catheter placement. dialysis unit initiating clip process.  Dialysis planned  02/23/2018  Anemia T sat 16%.  Will start intravenous iron.   Darbepoetin administered 02/25/2018 150 mcg.  Bones.  Calcitriol 0.5 mcg daily started 02/24/2018 PhosLo 1.334 g 3 times daily with meals started 02/24/2018.  Still has very significant hyperphosphatemia will need to follow calcium and phosphorus closely in setting of calcitriol and calcium based binders.  Diabetes mellitus as per primary team  Hypertension/volume I suspect will need dialysis to remove volume and maintain euvolemia.  Continues on tamsulosin for BPH.  Acute encephalopathy agree with Dr. Verlon Au that this is most likely multifactorial uremia and build up her psychotropic medications.  Agree with holding baclofen.  Diastolic-systolic heart failure EF 45%  Bipolar disease currently on Prozac and BuSpar.  The need to involve psychiatry  CAD with cardiac arrest 2011 lipid-lowering agents beta-blocker and aspirin  Status post pacemaker [place].  Already interrogated in the emergency room   LOS: [place] @TODAY @9 :25 AM

## 2018-02-26 NOTE — Op Note (Signed)
Patient name: Matthew [name] MRN: [id number] DOB: [birth year] Sex: male  02/27/2018 Pre-operative Diagnosis: New start dialysis Post-operative diagnosis:  Same Surgeon:  Annamarie Major Assistants: Arlee Muslim Procedure:   #1: Insertion of a right internal jugular vein tunneled dialysis catheter (28 cm palindrome) using ultrasound guidance   #2: Central venogram   #3: Right brachiocephalic fistula Anesthesia: General Blood Loss: Minimal Specimens: None  Findings: Widely patent central venous system.  The cephalic vein measured 5 mm.  The artery had a early takeoff of the radial artery.  It was calcified measuring 3 mm.  The fistula was somewhat pulsatile at the end of the case  Indications: Patient comes in today for dialysis access.  Procedure:  The patient was identified in the holding area and taken to [place] 10  The patient was then placed supine on the table. general anesthesia was administered.  The patient was prepped and draped in the usual sterile fashion.  A time out was called and antibiotics were administered.  Ultrasound was used to evaluate the right internal jugular vein in the neck.  It was widely patent and easily compressible.  A 11 blade was used to make a skin nick.  The right internal jugular vein was then cannulated under ultrasound guidance with an 18-gauge needle.  An 035 wire was inserted.  I could not direct the wire into the inferior vena cava, and so a Berenstein catheter was inserted.  I still could not direct the wire into the inferior vena cava and so a central venogram was performed which showed a widely patent central venous system.  Next, the subcutaneous tissue was sequentially dilated.  A peel-away sheath was placed.  A 27 cm palindrome catheter was then inserted and the peel-away sheath was removed.  A skin exit site below the clavicle was made with a #11 blade.  A subcutaneous tunnel was created and the catheter was brought through the tunnel with the  cuff situated the skin exit site.  The catheter was then connected to the hub.  Both ports flushed and aspirated without difficulty.  Fluoroscopy was used to confirm that there were no kinks within the catheter and the catheter tip was at the cavoatrial junction.  The catheter was sutured to the skin with 3-0 nylon in the skin incision in the neck was closed with 4-0 Vicryl.  There was some oozing from the skin incision in the neck and so a 3-0 nylon U stitch was placed for hemostasis.  Attention was then turned towards the right arm.  The cephalic vein was evaluated with ultrasound and found be widely patent throughout the upper arm.  There was 2 arteries at the antecubital crease suggesting a high takeoff of the radial artery.  A transverse incision was made at the antecubital crease.  I first dissected out what was the likely radial artery.  This was calcified and measuring 3 mm.  I then dissected out the cephalic vein.  This was a 5 mm vein.  There was dense scar tissue around the vein.  Side branches were ligated.  Once the vein was fully mobilized, it was marked for orientation and then ligated distally with a 2-0 silk tie.  There was a thickened valve within the vein approximately 1.5 cm from where I transected the vein.  I inverted the vein and then resected the thickened valve.  Next, the artery was occluded with vascular clamps.  #11 blade was used to make an arteriotomy which was  extended longitudinally with Potts scissors.  The vein was cut the appropriate length and spatulated to fit the size of the arteriotomy.  A running anastomosis was created 6-0 Prolene.  Prior to completion the apparent flushing maneuvers were performed and the anastomosis was completed.  There is an excellent thrill within the fistula however it was somewhat pulsatile.  I inspected the course of the vein to make sure there were no kinks or additional branches.  There were brisk radial and ulnar Doppler signals.  Hemostasis was  then achieved with cautery.  The incision was closed with 2 layers of Vicryl followed by Dermabond.  There were no immediate complications.   Disposition: To PACU stable   V. Annamarie Major, M.D. [facility] Office: [phone] Pager:  [phone]

## 2018-02-26 NOTE — Telephone Encounter (Signed)
-----   Message from Sueanne Margarita, MD sent at 02/20/2018  4:46 PM EST ----- Please let patient know that they had a unssuccessful PAP titration due to severity of OSA.  Please set up in lab study with BiPAP S/T ASAP

## 2018-02-26 NOTE — Transfer of Care (Signed)
Immediate Anesthesia Transfer of Care Note  Patient: Matthew [name]  Procedure(s) Performed: INSERTION TUNNELED  OF Right Internal jugular DIALYSIS CATHETER (N/A Neck) Creation of Right arm Brachiocephalic Fistula (Right Arm Lower)  Patient Location: PACU  Anesthesia Type:General  Level of Consciousness: awake and alert   Airway & Oxygen Therapy: Patient Spontanous Breathing and Patient connected to nasal cannula oxygen  Post-op Assessment: Report given to RN and Post -op Vital signs reviewed and stable  Post vital signs: Reviewed and stable  Last Vitals:  Vitals Value Taken Time  BP 124/73 02/22/2018 11:18 AM  Temp    Pulse 82 02/07/2018 11:20 AM  Resp 7 02/22/2018 11:20 AM  SpO2 100 % 02/11/2018 11:20 AM  Vitals shown include unvalidated device data.  Last Pain:  Vitals:   02/27/2018 0812  TempSrc: Oral  PainSc:          Complications: No apparent anesthesia complications

## 2018-02-27 ENCOUNTER — Encounter (HOSPITAL_COMMUNITY): Payer: Self-pay | Admitting: Surgery

## 2018-02-27 LAB — RENAL FUNCTION PANEL
Albumin: 2.3 g/dL — ABNORMAL LOW (ref 3.5–5.0)
Anion gap: 13 (ref 5–15)
BUN: 73 mg/dL — ABNORMAL HIGH (ref 6–20)
CO2: 20 mmol/L — AB (ref 22–32)
CREATININE: 5.6 mg/dL — AB (ref 0.61–1.24)
Calcium: 8.1 mg/dL — ABNORMAL LOW (ref 8.9–10.3)
Chloride: 108 mmol/L (ref 98–111)
GFR calc Af Amer: 12 mL/min — ABNORMAL LOW (ref >60)
GFR calc non Af Amer: 11 mL/min — ABNORMAL LOW (ref >60)
Glucose, Bld: 143 mg/dL — ABNORMAL HIGH (ref 70–99)
Phosphorus: 7.3 mg/dL — ABNORMAL HIGH (ref 2.5–4.6)
Potassium: 4 mmol/L (ref 3.5–5.1)
Sodium: 141 mmol/L (ref 135–145)

## 2018-02-27 LAB — GLUCOSE, CAPILLARY
GLUCOSE-CAPILLARY: 194 mg/dL — AB (ref 70–99)
Glucose-Capillary: 150 mg/dL — ABNORMAL HIGH (ref 70–99)
Glucose-Capillary: 177 mg/dL — ABNORMAL HIGH (ref 70–99)
Glucose-Capillary: 193 mg/dL — ABNORMAL HIGH (ref 70–99)

## 2018-02-27 LAB — HEPATITIS B SURFACE ANTIBODY,QUALITATIVE: Hep B S Ab: NONREACTIVE

## 2018-02-27 LAB — HEPATITIS B CORE ANTIBODY, TOTAL: HEP B C TOTAL AB: NEGATIVE

## 2018-02-27 LAB — HEPATITIS B SURFACE ANTIGEN: Hepatitis B Surface Ag: NEGATIVE

## 2018-02-27 MED ORDER — CALCIUM ACETATE (PHOS BINDER) 667 MG PO CAPS
1334.0000 mg | ORAL_CAPSULE | Freq: Three times a day (TID) | ORAL | Status: DC
  Administered 2018-02-27 – 2018-03-08 (×20): 1334 mg via ORAL
  Filled 2018-02-27 (×20): qty 2

## 2018-02-27 MED ORDER — CHLORHEXIDINE GLUCONATE CLOTH 2 % EX PADS
6.0000 | MEDICATED_PAD | Freq: Every day | CUTANEOUS | Status: DC
  Administered 2018-02-27 – 2018-03-08 (×9): 6 via TOPICAL

## 2018-02-27 NOTE — Progress Notes (Addendum)
  Progress Note    02/27/2018 8:50 AM 1 Day Post-Op  Subjective:  Denies signs or symptoms of steal syndrome R hand.   Vitals:   02/27/18 0000 02/27/18 0814  BP: 126/73 139/75  Pulse: 85 88  Resp: 20 20  Temp: 98.5 F (36.9 C) 98.7 F (37.1 C)  SpO2: 94% 98%   Physical Exam: Lungs:  Non labored Incisions:  R AC fossa incision c/d/i; local ecchymosis, soft, no palpable hematoma Extremities:  Palpable R radial pulse; palpable thrill near [facility] fossa, audible bruit throughout fistula in upper arm Neurologic: A&O  CBC    Component Value Date/Time   WBC 8.2 02/03/2018 0251   RBC 3.25 (L) 02/09/2018 0251   HGB 8.4 (L) 02/25/2018 0251   HCT 27.5 (L) 02/25/2018 0251   PLT 138 (L) 02/20/2018 0251   MCV 84.6 02/04/2018 0251   MCH 25.8 (L) 02/22/2018 0251   MCHC 30.5 03/04/2018 0251   RDW 14.9 02/06/2018 0251   LYMPHSABS 0.8 02/25/2018 0250   MONOABS 0.8 02/25/2018 0250   EOSABS 0.2 02/25/2018 0250   BASOSABS 0.0 02/25/2018 0250    BMET    Component Value Date/Time   NA 141 02/27/2018 0322   K 4.0 02/27/2018 0322   CL 108 02/27/2018 0322   CO2 20 (L) 02/27/2018 0322   GLUCOSE 143 (H) 02/27/2018 0322   BUN 73 (H) 02/27/2018 0322   CREATININE 5.60 (H) 02/27/2018 0322   CREATININE 3.12 (H) 08/15/2017 1419   CALCIUM 8.1 (L) 02/27/2018 0322   GFRNONAA 11 (L) 02/27/2018 0322   GFRNONAA 22 (L) 08/15/2017 1419   GFRAA 12 (L) 02/27/2018 0322   GFRAA 25 (L) 08/15/2017 1419    INR    Component Value Date/Time   INR 1.19 02/21/2018 0251     Intake/Output Summary (Last 24 hours) at 02/27/2018 0850 Last data filed at 02/27/2018 0600 Gross per 24 hour  Intake 2064.35 ml  Output 2216 ml  Net -151.65 ml     Assessment/Plan:  52 y.o. male is s/p R TDC and R brachiocephalic fistula creation 1 Day Post-Op   TDC site unremarkable; CXR showing tip in distal SVC Patent R brachiocephalic fistula with palpable thrill; perfusing R hand with palpable radial Incision  unremarkable; local ecchymosis but no concern for hematoma Ok for discharge from vascular standpoint Fistula duplex in 4-6 weeks, office will call to arrange   Dagoberto Ligas, PA-C [facility] [phone] 02/27/2018 8:50 AM   I agree with the above.  Patient will follow-up in 4 to 6 weeks with a duplex of his fistula  [place].

## 2018-02-27 NOTE — Progress Notes (Signed)
[facility] ROUNDING NOTE   Subjective:   Chronically ill nondistressed gentleman this morning.  No complaints this morning.  Underwent dialysis 02/10/2018 with removal of 2 L.  Blood pressure 139/75 pulse 88 temperature 98.7 O2 sats 98% room air   Urine output 1.8 L12/23/2019  Sodium 141 potassium 4.0 chloride 108 CO2 20 BUN 73 creatinine 5.6 glucose 143 calcium 8.1 phosphorus 7.3 albumin 2.3.  Hepatitis studies negative  Objective:  Vital signs in last 24 hours:  Temp:  [97.3 F (36.3 C)-98.7 F (37.1 C)] 98.7 F (37.1 C) (12/25 0814) Pulse Rate:  [69-88] 88 (12/25 0814) Resp:  [7-20] 20 (12/25 0814) BP: (112-143)/(62-86) 139/75 (12/25 0814) SpO2:  [94 %-100 %] 98 % (12/25 0814) Weight:  [123.6 kg-124.8 kg] 124.3 kg (12/25 0413)  Weight change: -0.8 kg Filed Weights   02/09/2018 1254 02/20/2018 1500 02/27/18 0413  Weight: 124.8 kg 123.6 kg 124.3 kg    Intake/Output: I/O last 3 completed shifts: In: 2859.8 [P.O.:1080; I.V.:1679.8; IV Piggyback:100] Out: 2716 [Urine:700; Other:2000; Stool:1; Blood:15]   Intake/Output this shift:  No intake/output data recorded.  Chronically ill-appearing multiple abrasions CVS- RRR murmurs rubs gallops JVP not elevated RS- CTA clear to auscultation ABD- BS present soft non-distended obese abdomen EXT-1+ edema   Basic Metabolic Panel: Recent Labs  Lab 02/23/18 0554 02/24/18 0259 02/25/18 0250 02/13/2018 0251 02/27/18 0322  NA 141 138 137 139 141  K 3.8 3.6 3.6 3.9 4.0  CL 107 105 105 104 108  CO2 18* 16* 21* 19* 20*  GLUCOSE 122* 168* 134* 89 143*  BUN 85* 96* 98* 99* 73*  CREATININE 7.58* 7.74* 7.63* 7.63* 5.60*  CALCIUM 8.9 8.3* 8.4* 8.8* 8.1*  PHOS  --  9.6* 8.7* 8.4* 7.3*    Liver Function Tests: Recent Labs  Lab 02/20/2018 2330 02/23/18 0554 02/24/18 0259 02/25/18 0250 02/15/2018 0251 02/27/18 0322  AST 108* 96*  --   --   --   --   ALT 49* 52*  --   --   --   --   ALKPHOS 91 94  --   --   --   --    BILITOT 1.0 0.9  --   --   --   --   PROT 6.4* 6.3*  --   --   --   --   ALBUMIN 3.0* 2.8* 2.4* 2.6* 2.8* 2.3*   No results for input(s): LIPASE, AMYLASE in the last 168 hours. Recent Labs  Lab 02/23/18 0006  AMMONIA 24    CBC: Recent Labs  Lab 02/03/2018 2330 02/23/18 0003 02/24/18 0259 02/25/18 0250 02/25/2018 0251  WBC 11.2*  --  8.3 8.9 8.2  NEUTROABS 9.2*  --  6.4 7.1  --   HGB 8.5* 8.5* 7.8* 7.9* 8.4*  HCT 28.2* 25.0* 25.2* 25.9* 27.5*  MCV 86.5  --  86.3 84.9 84.6  PLT 124*  --  117* 125* 138*    Cardiac Enzymes: No results for input(s): CKTOTAL, CKMB, CKMBINDEX, TROPONINI in the last 168 hours.  BNP: Invalid input(s): POCBNP  CBG: Recent Labs  Lab 03/01/2018 0815 02/21/2018 1118 02/03/2018 1638 03/05/2018 2104 02/27/18 0726  GLUCAP 81 114* 127* 171* 150*    Microbiology: Results for orders placed or performed during the hospital encounter of 07/10/17  Culture, blood (Routine X 2) w Reflex to ID Panel     Status: None   Collection Time: 07/10/17 11:30 PM  Result Value Ref Range Status   Specimen Description BLOOD  LEFT ANTECUBITAL  Final   Special Requests   Final    BOTTLES DRAWN AEROBIC AND ANAEROBIC Blood Culture adequate volume   Culture   Final    NO GROWTH 5 DAYS Performed at [facility], [address], [place], [place] [address]    Report Status 07/16/2017 FINAL  Final  Culture, blood (Routine X 2) w Reflex to ID Panel     Status: None   Collection Time: 07/11/17  1:31 AM  Result Value Ref Range Status   Specimen Description BLOOD LEFT ARM  Final   Special Requests   Final    BOTTLES DRAWN AEROBIC AND ANAEROBIC Blood Culture adequate volume   Culture   Final    NO GROWTH 5 DAYS Performed at [facility], [place] [address], [place], [place] [address]    Report Status 07/16/2017 FINAL  Final    Coagulation Studies: Recent Labs    02/11/2018 0251  LABPROT 15.0  INR 1.19    Urinalysis: No results for input(s): COLORURINE, LABSPEC,  PHURINE, GLUCOSEU, HGBUR, BILIRUBINUR, KETONESUR, PROTEINUR, UROBILINOGEN, NITRITE, LEUKOCYTESUR in the last 72 hours.  Invalid input(s): APPERANCEUR    Imaging: Dg Chest Port 1 View  Result Date: 02/15/2018 CLINICAL DATA:  Dialysis catheter placement. EXAM: PORTABLE CHEST 1 VIEW COMPARISON:  Chest x-ray dated February 23, 2018. FINDINGS: Interval placement of a tunneled right internal jugular dialysis catheter with the tip in the distal SVC. Unchanged left chest wall pacemaker. Stable cardiomegaly status post CABG. Mild pulmonary vascular congestion. No focal consolidation, pleural effusion, or pneumothorax. No acute osseous abnormality. IMPRESSION: 1. Tunneled right internal jugular dialysis catheter with tip in the distal SVC. No pneumothorax. 2. Stable cardiomegaly.  Mild pulmonary vascular congestion. Electronically Signed   By: Titus Dubin M.D.   On: 02/09/2018 11:49   Dg Fluoro Guide Cv Line Right  Result Date: 02/06/2018 CLINICAL DATA:  Intraoperative imaging during dialysis catheter insertion. EXAM: CENTRAL VENOUS CATHETER WITH FLUOROSCOPY CONTRAST:  See operative report FLUOROSCOPY TIME:  Fluoroscopy Time:  2 minutes 33 seconds Radiation Exposure Index (if provided by the fluoroscopic device): None available Number of Acquired Spot Images: Subtraction imaging performed. COMPARISON:  None. FINDINGS: Intraoperative digital subtraction imaging performed of the chest. This demonstrates catheter within the SVC. Contrast injection confirms patency of the SVC. No central venous occlusion. IMPRESSION: Patent SVC. Electronically Signed   By: Jerilynn Mages.  Shick M.D.   On: 02/28/2018 10:23   Vas [place] Upper Ext Vein Mapping (pre-op Avf)  Result Date: 02/21/2018 UPPER EXTREMITY VEIN MAPPING  Indications: Pre-access. History: CKD.  Limitations: patient cannot keep still during the exam Comparison Study: No prior study available Performing Technologist: Rudell Cobb  Examination Guidelines: A complete  evaluation includes B-mode imaging, spectral Doppler, color Doppler, and power Doppler as needed of all accessible portions of each vessel. Bilateral testing is considered an integral part of a complete examination. Limited examinations for reoccurring indications may be performed as noted. +-----------------+-------------+----------+----------------------------------+ Right Cephalic   Diameter (cm)Depth (cm)             Findings              +-----------------+-------------+----------+----------------------------------+ Shoulder             0.49        1.28                                      +-----------------+-------------+----------+----------------------------------+  Prox upper arm       0.40        1.07               branching              +-----------------+-------------+----------+----------------------------------+ Mid upper arm        0.42        1.05   branching and Partially thrombosed +-----------------+-------------+----------+----------------------------------+ Dist upper arm       0.45        0.66               branching              +-----------------+-------------+----------+----------------------------------+ Antecubital fossa    0.26        0.70               branching              +-----------------+-------------+----------+----------------------------------+ Prox forearm         0.26        0.49                                      +-----------------+-------------+----------+----------------------------------+ Mid forearm          0.27        0.57                                      +-----------------+-------------+----------+----------------------------------+ Dist forearm         0.35        0.35        branching and thrombosed      +-----------------+-------------+----------+----------------------------------+ Wrist                                             not visualized            +-----------------+-------------+----------+----------------------------------+ +-----------------+-------------+----------+--------------+ Right Basilic    Diameter (cm)Depth (cm)   Findings    +-----------------+-------------+----------+--------------+ Shoulder                                not visualized +-----------------+-------------+----------+--------------+ Prox upper arm       0.23        0.90                  +-----------------+-------------+----------+--------------+ Mid upper arm        0.22        0.45                  +-----------------+-------------+----------+--------------+ Dist upper arm       0.19        0.29                  +-----------------+-------------+----------+--------------+ Antecubital fossa    0.21        0.24     branching    +-----------------+-------------+----------+--------------+ Prox forearm         0.41        0.39     branching    +-----------------+-------------+----------+--------------+ Mid forearm          0.27  0.44     branching    +-----------------+-------------+----------+--------------+ Distal forearm       0.28        0.35     branching    +-----------------+-------------+----------+--------------+ Elbow                                   not visualized +-----------------+-------------+----------+--------------+ Wrist                                   not visualized +-----------------+-------------+----------+--------------+ *See table(s) above for measurements and observations.  Diagnosing physician: Harold Barban MD Electronically signed by Harold Barban MD on 03/03/2018 at 10:57:16 AM.    Final      Medications:   . sodium chloride    . sodium chloride 50 mL/hr at 02/27/18 0235  . sodium chloride Stopped (02/27/2018 1927)  . ferric gluconate (FERRLECIT/NULECIT) IV 250 mg (02/24/18 1553)   . aspirin EC  81 mg Oral Daily  . busPIRone  10 mg Oral BID  . calcitRIOL  0.5 mcg Oral Daily  .  calcium acetate  1,334 mg Oral TID WC  . Chlorhexidine Gluconate Cloth  6 each Topical Q0600  . darbepoetin (ARANESP) injection - NON-DIALYSIS  150 mcg Subcutaneous Q Mon-1800  . FLUoxetine  20 mg Oral Daily  . heparin  5,000 Units Subcutaneous Q8H  . insulin aspart  0-5 Units Subcutaneous QHS  . insulin aspart  0-9 Units Subcutaneous TID WC  . insulin glargine  10 Units Subcutaneous BID  . metoprolol succinate  25 mg Oral Daily  . rosuvastatin  10 mg Oral q1800  . sodium bicarbonate  650 mg Oral TID  . sodium chloride flush  3 mL Intravenous Q12H  . tamsulosin  0.4 mg Oral Daily   sodium chloride, acetaminophen **OR** acetaminophen, HYDROcodone-acetaminophen, ondansetron **OR** ondansetron (ZOFRAN) IV, sodium chloride, sodium chloride flush  Assessment/ Plan:   Progressive renal insufficiency secondary to diabetes/hypertension.  Foley catheter placed with no improvement in renal function GFR less than 10 cc/min.  I suspect that we will need to initiate dialysis if he is agreeable.  Renal ultrasound 10.7 cm kidney on the right 12.1 cm kidney on left no hydronephrosis mild cortical thinning.  Appreciate VVS for fistula and catheter placement. dialysis unit initiating clip process.  We will plan dialysis 02/28/2018  Anemia T sat 16%.  Will start intravenous iron.  Darbepoetin administered 02/25/2018 150 mcg.  Bones.  Calcitriol 0.5 mcg daily started 02/24/2018 PhosLo 1.334 g 3 times daily with meals started 02/24/2018.  Still has very significant hyperphosphatemia will need to follow calcium and phosphorus closely in setting of calcitriol and calcium based binders.  Diabetes mellitus as per primary team  Hypertension/volume I suspect will need dialysis to remove volume and maintain euvolemia.  Continues on tamsulosin for BPH.  Acute encephalopathy agree with Dr. Verlon Au that this is most likely multifactorial uremia and build up her psychotropic medications.  Agree with holding  baclofen.  Diastolic-systolic heart failure EF 45%  Bipolar disease currently on Prozac and BuSpar.  The need to involve psychiatry  CAD with cardiac arrest 2011 lipid-lowering agents beta-blocker and aspirin  Status post pacemaker [place].  Already interrogated in the emergency room   LOS: [place] @TODAY @9 :24 AM

## 2018-02-27 NOTE — Plan of Care (Signed)
Discussed with patient plan of care for the evening, pain management and dialysis catheter dressing change with some teach back displayed

## 2018-02-27 NOTE — Progress Notes (Signed)
[facility] PROGRESS NOTE  Azarias Chiou IOE:[id number] DOB: November 08, 1965 DOA: 02/28/2018  PCP: [name], MD  Brief History/Interval Summary: 52-year-old male with a past medical history of coronary artery disease status post bypass, history of cardiac arrest status post pacemaker/AICD, chronic kidney disease stage IV, diabetes mellitus type 2, hyperlipidemia, history of obstructive sleep apnea recently diagnosed hospitalized in April for hand cellulitis presented with somnolence falls patient was noted to have worsening renal function with a creatinine up to 7.4 from his baseline of 3.1.  He was also found to have urinary retention.  He was hospitalized for further management.     Reason for Visit: Acute renal failure.  Consultants: Nephrology  Procedures: Dialysis catheter placement.  AV fistula creation  Antibiotics: None  Subjective/Interval History: Patient feels well this morning.  Denies any complaints.  Asking about going home.    ROS: Denies any shortness of breath  Objective:  Vital Signs  Vitals:   02/06/2018 1606 02/27/18 0000 02/27/18 0413 02/27/18 0814  BP: 137/80 126/73  139/75  Pulse: 84 85  88  Resp: 18 20  20   Temp: 97.6 F (36.4 C) 98.5 F (36.9 C)  98.7 F (37.1 C)  TempSrc: Oral Oral  Oral  SpO2: 98% 94%  98%  Weight:   124.3 kg   Height:        Intake/Output Summary (Last 24 hours) at 02/27/2018 1220 Last data filed at 02/27/2018 1030 Gross per 24 hour  Intake 1904.35 ml  Output 2201 ml  Net -296.65 ml   Filed Weights   02/25/2018 1254 03/05/2018 1500 02/27/18 0413  Weight: 124.8 kg 123.6 kg 124.3 kg    General appearance: Awake alert.  In no distress Resp: Clear to auscultation bilaterally.  Normal effort. Cardio: S1-S2 is normal regular.  No S3-S4.  No rubs murmurs or bruit GI: Abdomen soft.  Nontender nondistended Extremities: Lateral lower extremities Neurologic: No focal neurological deficits.  Lab Results:  Data  Reviewed: I have personally reviewed following labs and imaging studies  CBC: Recent Labs  Lab 02/03/2018 2330 02/23/18 0003 02/24/18 0259 02/25/18 0250 02/25/2018 0251  WBC 11.2*  --  8.3 8.9 8.2  NEUTROABS 9.2*  --  6.4 7.1  --   HGB 8.5* 8.5* 7.8* 7.9* 8.4*  HCT 28.2* 25.0* 25.2* 25.9* 27.5*  MCV 86.5  --  86.3 84.9 84.6  PLT 124*  --  117* 125* 138*    Basic Metabolic Panel: Recent Labs  Lab 02/23/18 0554 02/24/18 0259 02/25/18 0250 03/05/2018 0251 02/27/18 0322  NA 141 138 137 139 141  K 3.8 3.6 3.6 3.9 4.0  CL 107 105 105 104 108  CO2 18* 16* 21* 19* 20*  GLUCOSE 122* 168* 134* 89 143*  BUN 85* 96* 98* 99* 73*  CREATININE 7.58* 7.74* 7.63* 7.63* 5.60*  CALCIUM 8.9 8.3* 8.4* 8.8* 8.1*  PHOS  --  9.6* 8.7* 8.4* 7.3*    GFR: Estimated Creatinine Clearance: 19.6 mL/min (A) (by C-G formula based on SCr of 5.6 mg/dL (H)).  Liver Function Tests: Recent Labs  Lab 03/02/2018 2330 02/23/18 0554 02/24/18 0259 02/25/18 0250 02/14/2018 0251 02/27/18 0322  AST 108* 96*  --   --   --   --   ALT 49* 52*  --   --   --   --   ALKPHOS 91 94  --   --   --   --   BILITOT 1.0 0.9  --   --   --   --  PROT 6.4* 6.3*  --   --   --   --   ALBUMIN 3.0* 2.8* 2.4* 2.6* 2.8* 2.3*     Recent Labs  Lab 02/23/18 0006  AMMONIA 24    CBG: Recent Labs  Lab 02/25/2018 1118 02/03/2018 1638 02/11/2018 2104 02/27/18 0726 02/27/18 1148  GLUCAP 114* 127* 171* 150* 194*    Anemia Panel: No results for input(s): VITAMINB12, FOLATE, FERRITIN, TIBC, IRON, RETICCTPCT in the last 72 hours.   Radiology Studies: Dg Chest Port 1 View  Result Date: 02/21/2018 CLINICAL DATA:  Dialysis catheter placement. EXAM: PORTABLE CHEST 1 VIEW COMPARISON:  Chest x-ray dated February 23, 2018. FINDINGS: Interval placement of a tunneled right internal jugular dialysis catheter with the tip in the distal SVC. Unchanged left chest wall pacemaker. Stable cardiomegaly status post CABG. Mild pulmonary vascular  congestion. No focal consolidation, pleural effusion, or pneumothorax. No acute osseous abnormality. IMPRESSION: 1. Tunneled right internal jugular dialysis catheter with tip in the distal SVC. No pneumothorax. 2. Stable cardiomegaly.  Mild pulmonary vascular congestion. Electronically Signed   By: Titus Dubin M.D.   On: 02/25/2018 11:49   Dg Fluoro Guide Cv Line Right  Result Date: 03/04/2018 CLINICAL DATA:  Intraoperative imaging during dialysis catheter insertion. EXAM: CENTRAL VENOUS CATHETER WITH FLUOROSCOPY CONTRAST:  See operative report FLUOROSCOPY TIME:  Fluoroscopy Time:  2 minutes 33 seconds Radiation Exposure Index (if provided by the fluoroscopic device): None available Number of Acquired Spot Images: Subtraction imaging performed. COMPARISON:  None. FINDINGS: Intraoperative digital subtraction imaging performed of the chest. This demonstrates catheter within the SVC. Contrast injection confirms patency of the SVC. No central venous occlusion. IMPRESSION: Patent SVC. Electronically Signed   By: Jerilynn Mages.  Shick M.D.   On: 02/27/2018 10:23   Vas [place] Upper Ext Vein Mapping (pre-op Avf)  Result Date: 02/13/2018 UPPER EXTREMITY VEIN MAPPING  Indications: Pre-access. History: CKD.  Limitations: patient cannot keep still during the exam Comparison Study: No prior study available Performing Technologist: Rudell Cobb  Examination Guidelines: A complete evaluation includes B-mode imaging, spectral Doppler, color Doppler, and power Doppler as needed of all accessible portions of each vessel. Bilateral testing is considered an integral part of a complete examination. Limited examinations for reoccurring indications may be performed as noted. +-----------------+-------------+----------+----------------------------------+ Right Cephalic   Diameter (cm)Depth (cm)             Findings              +-----------------+-------------+----------+----------------------------------+ Shoulder             0.49         1.28                                      +-----------------+-------------+----------+----------------------------------+ Prox upper arm       0.40        1.07               branching              +-----------------+-------------+----------+----------------------------------+ Mid upper arm        0.42        1.05   branching and Partially thrombosed +-----------------+-------------+----------+----------------------------------+ Dist upper arm       0.45        0.66               branching              +-----------------+-------------+----------+----------------------------------+  Antecubital fossa    0.26        0.70               branching              +-----------------+-------------+----------+----------------------------------+ Prox forearm         0.26        0.49                                      +-----------------+-------------+----------+----------------------------------+ Mid forearm          0.27        0.57                                      +-----------------+-------------+----------+----------------------------------+ Dist forearm         0.35        0.35        branching and thrombosed      +-----------------+-------------+----------+----------------------------------+ Wrist                                             not visualized           +-----------------+-------------+----------+----------------------------------+ +-----------------+-------------+----------+--------------+ Right Basilic    Diameter (cm)Depth (cm)   Findings    +-----------------+-------------+----------+--------------+ Shoulder                                not visualized +-----------------+-------------+----------+--------------+ Prox upper arm       0.23        0.90                  +-----------------+-------------+----------+--------------+ Mid upper arm        0.22        0.45                  +-----------------+-------------+----------+--------------+  Dist upper arm       0.19        0.29                  +-----------------+-------------+----------+--------------+ Antecubital fossa    0.21        0.24     branching    +-----------------+-------------+----------+--------------+ Prox forearm         0.41        0.39     branching    +-----------------+-------------+----------+--------------+ Mid forearm          0.27        0.44     branching    +-----------------+-------------+----------+--------------+ Distal forearm       0.28        0.35     branching    +-----------------+-------------+----------+--------------+ Elbow                                   not visualized +-----------------+-------------+----------+--------------+ Wrist                                   not visualized +-----------------+-------------+----------+--------------+ *See table(s) above for measurements and  observations.  Diagnosing physician: Harold Barban MD Electronically signed by Harold Barban MD on 02/07/2018 at 10:57:16 AM.    Final      Medications:  Scheduled: . aspirin EC  81 mg Oral Daily  . busPIRone  10 mg Oral BID  . calcitRIOL  0.5 mcg Oral Daily  . calcium acetate  1,334 mg Oral TID WC  . Chlorhexidine Gluconate Cloth  6 each Topical Q0600  . Chlorhexidine Gluconate Cloth  6 each Topical Q0600  . darbepoetin (ARANESP) injection - NON-DIALYSIS  150 mcg Subcutaneous Q Mon-1800  . FLUoxetine  20 mg Oral Daily  . heparin  5,000 Units Subcutaneous Q8H  . insulin aspart  0-5 Units Subcutaneous QHS  . insulin aspart  0-9 Units Subcutaneous TID WC  . insulin glargine  10 Units Subcutaneous BID  . metoprolol succinate  25 mg Oral Daily  . rosuvastatin  10 mg Oral q1800  . sodium bicarbonate  650 mg Oral TID  . sodium chloride flush  3 mL Intravenous Q12H  . tamsulosin  0.4 mg Oral Daily   Continuous: . sodium chloride    . sodium chloride 50 mL/hr at 02/27/18 0235  . sodium chloride Stopped (03/04/2018 1927)  . ferric  gluconate (FERRLECIT/NULECIT) IV 250 mg (02/27/18 1137)   BUL:AGTXMI chloride, acetaminophen **OR** acetaminophen, HYDROcodone-acetaminophen, ondansetron **OR** ondansetron (ZOFRAN) IV, sodium chloride, sodium chloride flush    Assessment/Plan:  Acute renal failure superimposed on chronic kidney disease stage IV Initially thought to be due to urinary retention but there was no improvement despite Foley catheter placement.  So this appears to be an intrinsic renal failure.  Patient seen by nephrology.  No improvement in renal function since admission.  Dialysis recommended by nephrology.  Patient initially refused but then after further discussions he was agreeable.  Vascular surgery consulted.  Tunneled access placed on 12/24.  AV fistula created in the right arm.  Patient underwent his first dialysis session on 12/24.  Next session is planned for 12/26.  Patient will need to be established in an outpatient dialysis center before he can be discharged.     Urinary retention Patient found to have urinary retention at the time of admission.  Foley catheter was placed.  He has had good urine output.  Foley catheter was discontinued.  He has been able to void on his own.  Continue tamsulosin.   Acute metabolic encephalopathy Most likely this is due to his uremia.  Imaging studies unremarkable.  Patient does not have any neurological deficits.  Mentation appears to have improved.  Chronic combined systolic and diastolic CHF/AICD EF noted to be 45 to 50% based on echo done in May 2019.  Previously it has been lower.  Patient is status post AICD.  Volume to be now managed with hemodialysis.  Insulin-dependent diabetes mellitus HbA1c 9.3 in April.  Managed at home with [place].  On a lower dose of Lantus here in the hospital.  Continue to monitor CBGs.  Anemia of chronic kidney disease No evidence of overt bleeding.  Hemoglobin is stable.  History of depression and anxiety Continue home  medications.  History of coronary artery disease status post CABG Stable.  Morbid obesity BMI 40  Obstructive sleep apnea Patient tells that he underwent sleep study recently. Progress note from 10/12 reviewed.  Found to have severe OSA with AHI of 62.1/h.  Patient actually was recommended BiPAP due to suboptimal CPAP titration. Continue CPAP here in the hospital.  His outpatient providers to pursue  BiPAP.  Pressure injury Stage II - Partial thickness loss of dermis presenting as a shallow open ulcer with a red, pink wound bed without slough.Sacrum, posterior, medial.  Wound care.   DVT Prophylaxis: Subcutaneous heparin    Code Status: Full code Family Communication: Discussed with the patient Disposition Plan: Management as outlined above.  Mobilize.      LOS: 3 days   [facility] Pager [phone] 02/27/2018, 12:20 PM  If 7PM-7AM, please contact night-coverage at [email or web address], password [facility]

## 2018-02-28 ENCOUNTER — Telehealth: Payer: Self-pay | Admitting: Surgery

## 2018-02-28 LAB — RENAL FUNCTION PANEL
ALBUMIN: 2.5 g/dL — AB (ref 3.5–5.0)
Anion gap: 10 (ref 5–15)
BUN: 81 mg/dL — ABNORMAL HIGH (ref 6–20)
CO2: 22 mmol/L (ref 22–32)
Calcium: 8.7 mg/dL — ABNORMAL LOW (ref 8.9–10.3)
Chloride: 106 mmol/L (ref 98–111)
Creatinine, Ser: 6.21 mg/dL — ABNORMAL HIGH (ref 0.61–1.24)
GFR calc Af Amer: 11 mL/min — ABNORMAL LOW (ref >60)
GFR calc non Af Amer: 9 mL/min — ABNORMAL LOW (ref >60)
Glucose, Bld: 135 mg/dL — ABNORMAL HIGH (ref 70–99)
Phosphorus: 7.3 mg/dL — ABNORMAL HIGH (ref 2.5–4.6)
Potassium: 4.1 mmol/L (ref 3.5–5.1)
Sodium: 138 mmol/L (ref 135–145)

## 2018-02-28 LAB — GLUCOSE, CAPILLARY
GLUCOSE-CAPILLARY: 126 mg/dL — AB (ref 70–99)
Glucose-Capillary: 115 mg/dL — ABNORMAL HIGH (ref 70–99)
Glucose-Capillary: 96 mg/dL (ref 70–99)

## 2018-02-28 LAB — CBC
HCT: 24.4 % — ABNORMAL LOW (ref 39.0–52.0)
Hemoglobin: 7.5 g/dL — ABNORMAL LOW (ref 13.0–17.0)
MCH: 26.4 pg (ref 26.0–34.0)
MCHC: 30.7 g/dL (ref 30.0–36.0)
MCV: 85.9 fL (ref 80.0–100.0)
Platelets: 123 10*3/uL — ABNORMAL LOW (ref 150–400)
RBC: 2.84 MIL/uL — ABNORMAL LOW (ref 4.22–5.81)
RDW: 14.9 % (ref 11.5–15.5)
WBC: 7.9 10*3/uL (ref 4.0–10.5)
nRBC: 0 % (ref 0.0–0.2)

## 2018-02-28 MED ORDER — HEPARIN SODIUM (PORCINE) 1000 UNIT/ML IJ SOLN
INTRAMUSCULAR | Status: AC
  Administered 2018-02-28: 3800 [IU] via INTRAVENOUS_CENTRAL
  Filled 2018-02-28: qty 4

## 2018-02-28 MED ORDER — SODIUM CHLORIDE 0.9 % IV SOLN: 100.0000 mL | INTRAVENOUS | Status: DC | PRN

## 2018-02-28 MED ORDER — HEPARIN SODIUM (PORCINE) 1000 UNIT/ML DIALYSIS
1000.0000 [IU] | INTRAMUSCULAR | Status: DC | PRN
  Administered 2018-02-28: 3800 [IU] via INTRAVENOUS_CENTRAL
  Filled 2018-02-28: qty 1

## 2018-02-28 MED ORDER — DARBEPOETIN ALFA 150 MCG/0.3ML IJ SOSY: 150.0000 ug | PREFILLED_SYRINGE | INTRAMUSCULAR | Status: DC

## 2018-02-28 MED ORDER — LIDOCAINE HCL (PF) 1 % IJ SOLN: 5.0000 mL | INTRAMUSCULAR | Status: DC | PRN

## 2018-02-28 MED ORDER — LIDOCAINE-PRILOCAINE 2.5-2.5 % EX CREA: 1.0000 "application " | TOPICAL_CREAM | CUTANEOUS | Status: DC | PRN

## 2018-02-28 MED ORDER — ZOLPIDEM TARTRATE 5 MG PO TABS
5.0000 mg | ORAL_TABLET | Freq: Every evening | ORAL | Status: DC | PRN
  Administered 2018-02-28 – 2018-03-01 (×2): 5 mg via ORAL
  Filled 2018-02-28 (×2): qty 1

## 2018-02-28 MED ORDER — PENTAFLUOROPROP-TETRAFLUOROETH EX AERO: 1.0000 "application " | INHALATION_SPRAY | CUTANEOUS | Status: DC | PRN

## 2018-02-28 MED ORDER — ALTEPLASE 2 MG IJ SOLR
2.0000 mg | Freq: Once | INTRAMUSCULAR | Status: DC | PRN
  Filled 2018-02-28: qty 2

## 2018-02-28 NOTE — Progress Notes (Signed)
[facility] ROUNDING NOTE   Subjective:   No complaints this morning seen on dialysis 02/28/2018  Blood pressure 115/51 pulse 63 temperature 97.4 O2 sats 96% room air  Urine output 850 cc 02/27/2018  Sodium 138 potassium 4.1 chloride is 106 CO2 22 BUN 81 creatinine 6.21 glucose 135 calcium 8.7 phosphorus 7.3 albumin 2.5 hemoglobin 7.5 platelets 123 WBC 7.9  Objective:  Vital signs in last 24 hours:  Temp:  [97.4 F (36.3 C)-98.2 F (36.8 C)] 97.4 F (36.3 C) (12/26 0734) Pulse Rate:  [63-86] 63 (12/26 0759) Resp:  [14-20] 18 (12/26 0734) BP: (111-147)/(51-80) 115/51 (12/26 0759) SpO2:  [91 %-98 %] 96 % (12/26 0734) Weight:  [125.4 kg] 125.4 kg (12/26 0734)  Weight change:  Filed Weights   02/07/2018 1500 02/27/18 0413 02/28/18 0734  Weight: 123.6 kg 124.3 kg 125.4 kg    Intake/Output: I/O last 3 completed shifts: In: 0932 [P.O.:1680; I.V.:1560; IV Piggyback:100] Out: 3557 [Urine:1650; Stool:1]   Intake/Output this shift:  No intake/output data recorded.  Chronically ill-appearing multiple abrasions CVS- RRR murmurs rubs gallops JVP not elevated RS- CTA clear to auscultation ABD- BS present soft non-distended obese abdomen EXT-1+ edema   Basic Metabolic Panel: Recent Labs  Lab 02/24/18 0259 02/25/18 0250 02/06/2018 0251 02/27/18 0322 02/28/18 0306  NA 138 137 139 141 138  K 3.6 3.6 3.9 4.0 4.1  CL 105 105 104 108 106  CO2 16* 21* 19* 20* 22  GLUCOSE 168* 134* 89 143* 135*  BUN 96* 98* 99* 73* 81*  CREATININE 7.74* 7.63* 7.63* 5.60* 6.21*  CALCIUM 8.3* 8.4* 8.8* 8.1* 8.7*  PHOS 9.6* 8.7* 8.4* 7.3* 7.3*    Liver Function Tests: Recent Labs  Lab 02/14/2018 2330 02/23/18 0554 02/24/18 0259 02/25/18 0250 02/18/2018 0251 02/27/18 0322 02/28/18 0306  AST 108* 96*  --   --   --   --   --   ALT 49* 52*  --   --   --   --   --   ALKPHOS 91 94  --   --   --   --   --   BILITOT 1.0 0.9  --   --   --   --   --   PROT 6.4* 6.3*  --   --   --   --    --   ALBUMIN 3.0* 2.8* 2.4* 2.6* 2.8* 2.3* 2.5*   No results for input(s): LIPASE, AMYLASE in the last 168 hours. Recent Labs  Lab 02/23/18 0006  AMMONIA 24    CBC: Recent Labs  Lab 02/20/2018 2330 02/23/18 0003 02/24/18 0259 02/25/18 0250 02/09/2018 0251 02/28/18 0306  WBC 11.2*  --  8.3 8.9 8.2 7.9  NEUTROABS 9.2*  --  6.4 7.1  --   --   HGB 8.5* 8.5* 7.8* 7.9* 8.4* 7.5*  HCT 28.2* 25.0* 25.2* 25.9* 27.5* 24.4*  MCV 86.5  --  86.3 84.9 84.6 85.9  PLT 124*  --  117* 125* 138* 123*    Cardiac Enzymes: No results for input(s): CKTOTAL, CKMB, CKMBINDEX, TROPONINI in the last 168 hours.  BNP: Invalid input(s): POCBNP  CBG: Recent Labs  Lab 02/27/2018 2104 02/27/18 0726 02/27/18 1148 02/27/18 1550 02/27/18 2051  GLUCAP 171* 150* 194* 177* 193*    Microbiology: Results for orders placed or performed during the hospital encounter of 07/10/17  Culture, blood (Routine X 2) w Reflex to ID Panel     Status: None   Collection Time: 07/10/17  11:30 PM  Result Value Ref Range Status   Specimen Description BLOOD LEFT ANTECUBITAL  Final   Special Requests   Final    BOTTLES DRAWN AEROBIC AND ANAEROBIC Blood Culture adequate volume   Culture   Final    NO GROWTH 5 DAYS Performed at [facility], [address], [place], [place] [address]    Report Status 07/16/2017 FINAL  Final  Culture, blood (Routine X 2) w Reflex to ID Panel     Status: None   Collection Time: 07/11/17  1:31 AM  Result Value Ref Range Status   Specimen Description BLOOD LEFT ARM  Final   Special Requests   Final    BOTTLES DRAWN AEROBIC AND ANAEROBIC Blood Culture adequate volume   Culture   Final    NO GROWTH 5 DAYS Performed at [facility], [place] [address] [place], [place] [address]    Report Status 07/16/2017 FINAL  Final    Coagulation Studies: Recent Labs    02/07/2018 0251  LABPROT 15.0  INR 1.19    Urinalysis: No results for input(s): COLORURINE, LABSPEC, PHURINE,  GLUCOSEU, HGBUR, BILIRUBINUR, KETONESUR, PROTEINUR, UROBILINOGEN, NITRITE, LEUKOCYTESUR in the last 72 hours.  Invalid input(s): APPERANCEUR    Imaging: Dg Chest Port 1 View  Result Date: 02/20/2018 CLINICAL DATA:  Dialysis catheter placement. EXAM: PORTABLE CHEST 1 VIEW COMPARISON:  Chest x-ray dated February 23, 2018. FINDINGS: Interval placement of a tunneled right internal jugular dialysis catheter with the tip in the distal SVC. Unchanged left chest wall pacemaker. Stable cardiomegaly status post CABG. Mild pulmonary vascular congestion. No focal consolidation, pleural effusion, or pneumothorax. No acute osseous abnormality. IMPRESSION: 1. Tunneled right internal jugular dialysis catheter with tip in the distal SVC. No pneumothorax. 2. Stable cardiomegaly.  Mild pulmonary vascular congestion. Electronically Signed   By: Titus Dubin M.D.   On: 02/22/2018 11:49   Dg Fluoro Guide Cv Line Right  Result Date: 02/25/2018 CLINICAL DATA:  Intraoperative imaging during dialysis catheter insertion. EXAM: CENTRAL VENOUS CATHETER WITH FLUOROSCOPY CONTRAST:  See operative report FLUOROSCOPY TIME:  Fluoroscopy Time:  2 minutes 33 seconds Radiation Exposure Index (if provided by the fluoroscopic device): None available Number of Acquired Spot Images: Subtraction imaging performed. COMPARISON:  None. FINDINGS: Intraoperative digital subtraction imaging performed of the chest. This demonstrates catheter within the SVC. Contrast injection confirms patency of the SVC. No central venous occlusion. IMPRESSION: Patent SVC. Electronically Signed   By: Jerilynn Mages.  Shick M.D.   On: 02/08/2018 10:23     Medications:   . ferric gluconate (FERRLECIT/NULECIT) IV 250 mg (02/27/18 1137)   . aspirin EC  81 mg Oral Daily  . busPIRone  10 mg Oral BID  . calcitRIOL  0.5 mcg Oral Daily  . calcium acetate  1,334 mg Oral TID WC  . Chlorhexidine Gluconate Cloth  6 each Topical Q0600  . darbepoetin (ARANESP) injection -  NON-DIALYSIS  150 mcg Subcutaneous Q Mon-1800  . FLUoxetine  20 mg Oral Daily  . heparin  5,000 Units Subcutaneous Q8H  . insulin aspart  0-5 Units Subcutaneous QHS  . insulin aspart  0-9 Units Subcutaneous TID WC  . insulin glargine  10 Units Subcutaneous BID  . metoprolol succinate  25 mg Oral Daily  . rosuvastatin  10 mg Oral q1800  . sodium bicarbonate  650 mg Oral TID  . tamsulosin  0.4 mg Oral Daily   acetaminophen **OR** acetaminophen, HYDROcodone-acetaminophen, ondansetron **OR** ondansetron (ZOFRAN) IV, sodium chloride  Assessment/ Plan:   Progressive renal insufficiency secondary to diabetes/hypertension.  Foley catheter placed with no improvement in renal function GFR less than 10 cc/min.  I suspect that we will need to initiate dialysis if he is agreeable.  Renal ultrasound 10.7 cm kidney on the right 12.1 cm kidney on left no hydronephrosis mild cortical thinning.  Appreciate VVS for fistula and catheter placement. dialysis unit initiating clip process.  Patient seen on dialysis 02/28/2018.  Still awaiting disposition  Anemia T sat 16%.  Intravenous iron administered.  Darbepoetin administered 02/25/2018 150 mcg.  Bones.  Calcitriol 0.5 mcg daily started 02/24/2018 PhosLo 1.334 g 3 times daily with meals started 02/24/2018.  Still has very significant hyperphosphatemia will need to follow calcium and phosphorus closely in setting of calcitriol and calcium based binders.  Phosphorus still elevated continue PhosLo 1.334 g with meals  Diabetes mellitus as per primary team  Hypertension/volume I suspect will need dialysis to remove volume and maintain euvolemia.  Continues on tamsulosin for BPH.  Acute encephalopathy agree with Dr. Verlon Au that this is most likely multifactorial uremia and build up her psychotropic medications.  Agree with holding baclofen.  Diastolic-systolic heart failure EF 45%  Bipolar disease currently on Prozac and BuSpar.  The need to involve  psychiatry  CAD with cardiac arrest 2011 lipid-lowering agents beta-blocker and aspirin  Status post pacemaker [place].  Already interrogated in the emergency room   LOS: [place] @TODAY @8 :51 AM

## 2018-02-28 NOTE — Evaluation (Signed)
Physical Therapy Evaluation Patient Details Name: Matthew [name] MRN: [id number] DOB: [birth year] Today's Date: 02/28/2018   History of Present Illness  Pt is a 52 y.o. M with significant PMH of CAD s/p bypass, history of cardiac arrest s/p pacemaker/AICD, chronic kidney disease stage IV, diabetes mellitus type 2, hyperlipidemia, who was admitted for worsening renal function.   Clinical Impression  Patient admitted with above diagnosis. Prior to admission, patient independent with ambulating with a cane and lives with his father, for which he performs IADL's for I.e. cooking, cleaning. On PT evaluation, patient presenting with decreased functional mobility secondary to debility/deconditioning, balance impairments, decreased endurance, and decreased sensation. Performing all functional mobility at min assist level. Only able to ambulate 4 feet with walker before requiring seated rest break. Recommend CIR to maximize functional independence and return to modI level prior to home. Pt very motivated and suspect he will progress well based on this, age, and PLOF. Will follow acutely.    Follow Up Recommendations CIR    Equipment Recommendations  None recommended by PT    Recommendations for Other Services       Precautions / Restrictions Precautions Precautions: Fall Restrictions Weight Bearing Restrictions: No      Mobility  Bed Mobility               General bed mobility comments: OOB in chair  Transfers Overall transfer level: Needs assistance Equipment used: Rolling walker (2 wheeled) Transfers: Sit to/from Stand Sit to Stand: Min assist         General transfer comment: Min assist for lifting. Cues for hand placement. Decreased eccentric control to sit  Ambulation/Gait Ambulation/Gait assistance: Min assist Gait Distance (Feet): 4 Feet Assistive device: Rolling walker (2 wheeled) Gait Pattern/deviations: Step-through pattern;Decreased stride length;Trunk flexed    Gait velocity interpretation: <1.31 ft/sec, indicative of household ambulator General Gait Details: Pt requiring minA for balance and close chair follow utilized. Fatigues quickly  Financial trader Rankin (Stroke Patients Only)       Balance Overall balance assessment: Needs assistance Sitting-balance support: Feet supported Sitting balance-Leahy Scale: Good     Standing balance support: Bilateral upper extremity supported Standing balance-Leahy Scale: Poor                               Pertinent Vitals/Pain Pain Assessment: Faces Faces Pain Scale: Hurts little more Pain Location: low back Pain Descriptors / Indicators: Spasm Pain Intervention(s): Limited activity within patient's tolerance;Monitored during session    Home Living Family/patient expects to be discharged to:: Private residence Living Arrangements: Parent(father) Available Help at Discharge: Family;Friend(s) Type of Home: House Home Access: Stairs to enter Entrance Stairs-Rails: Left;Right;Can reach both Entrance Stairs-Number of Steps: 2-3 Home Layout: Two level Home Equipment: Cane - single point;Walker - 2 wheels;Shower seat;Bedside commode      Prior Function Level of Independence: Independent with assistive device(s)         Comments: uses cane vs walker for ambulation      Hand Dominance   Dominant Hand: Right    Extremity/Trunk Assessment   Upper Extremity Assessment Upper Extremity Assessment: Overall WFL for tasks assessed    Lower Extremity Assessment Lower Extremity Assessment: RLE deficits/detail;LLE deficits/detail RLE Deficits / Details: Overall strength 5/5 except ankle dorsiflexion 2/5 RLE Sensation: decreased light touch;history of peripheral neuropathy LLE Deficits / Details: Overall 5/5  strength except ankle dorsiflexion 2.5 LLE Sensation: decreased light touch;history of peripheral neuropathy    Cervical / Trunk  Assessment Cervical / Trunk Assessment: Normal  Communication   Communication: No difficulties  Cognition Arousal/Alertness: Awake/alert Behavior During Therapy: WFL for tasks assessed/performed Overall Cognitive Status: Within Functional Limits for tasks assessed                                        General Comments      Exercises Other Exercises Other Exercises: Serial sit to stands x 5 Other Exercises: Seated LAQ's x 10   Assessment/Plan    PT Assessment Patient needs continued PT services  PT Problem List Decreased strength;Decreased activity tolerance;Decreased balance;Decreased mobility;Pain;Impaired sensation       PT Treatment Interventions DME instruction;Gait training;Stair training;Functional mobility training;Therapeutic activities;Therapeutic exercise;Balance training;Patient/family education    PT Goals (Current goals can be found in the Care Plan section)  Acute Rehab PT Goals Patient Stated Goal: "stand more." PT Goal Formulation: With patient Time For Goal Achievement: 03/14/18 Potential to Achieve Goals: Good    Frequency Min 3X/week   Barriers to discharge        Co-evaluation               AM-PAC PT "6 Clicks" Mobility  Outcome Measure Help needed turning from your back to your side while in a flat bed without using bedrails?: None Help needed moving from lying on your back to sitting on the side of a flat bed without using bedrails?: A Little Help needed moving to and from a bed to a chair (including a wheelchair)?: A Little Help needed standing up from a chair using your arms (e.g., wheelchair or bedside chair)?: A Little Help needed to walk in hospital room?: A Little Help needed climbing 3-5 steps with a railing? : A Lot 6 Click Score: 18    End of Session Equipment Utilized During Treatment: Gait belt Activity Tolerance: Patient tolerated treatment well Patient left: in chair;with call bell/phone within reach;with  chair alarm set Nurse Communication: Mobility status PT Visit Diagnosis: Unsteadiness on feet (R26.81);Difficulty in walking, not elsewhere classified (R26.2)    Time: 6967-8938 PT Time Calculation (min) (ACUTE ONLY): 30 min   Charges:   PT Evaluation $PT Eval Moderate Complexity: 1 Mod PT Treatments $Therapeutic Activity: 8-22 mins      Ellamae Sia, PT, DPT Acute Rehabilitation Services Pager [phone] Office [phone]   Willy Eddy 02/28/2018, 3:11 PM

## 2018-02-28 NOTE — Telephone Encounter (Signed)
-----   Message from Dagoberto Ligas, PA-C sent at 03/03/2018 11:06 AM EST -----  Can you schedule an appt for this pt in 6 weeks with fistula duplex on PA schedule.  PO R brachiocephalic fistula. Thanks, [facility]

## 2018-02-28 NOTE — Consult Note (Signed)
   [facility] CM Inpatient Consult   02/28/2018  Matthew [name] 07/19/1965 [id number]    Went to bedside to speak with Matthew [name] about [employer] services. He is agreeable and [facility] written consent obtained. He was previously active with [facility].   Matthew [name] states he will need [facility] follow up due to newly starting HD. States " I can use the support with all that I have going on."  Confirmed best contact number as [phone]. Confirmed Primary Care Provider is Dr. Moshe Cipro [facility] is listed as doing transition of care call). Reports he lives in [place], [place].   Noted CIR is recommended. Will continue to follow along for progression and disposition plans. Will make appropriate [facility] Care Management referral once disposition is known.   Marthenia Rolling, MSN-Ed, RN,BSN [facility] Liaison [phone]

## 2018-02-28 NOTE — Progress Notes (Signed)
Rehab Admissions Coordinator Note:  Patient was screened by Retta Diones for appropriateness for an Inpatient Acute Rehab Consult.  Noted PT recommending CIR.  Given current diagnosis, unlikely that we could get approval for acute inpatient rehab admission.  However, if MD wishes to pursue CIR, then can order a rehab consult.  Call me for questions.    Retta Diones 02/28/2018, 4:23 PM  I can be reached at [phone].

## 2018-02-28 NOTE — Care Management Important Message (Signed)
Important Message  Patient Details  Name: Matthew [name] MRN: [id number] Date of Birth: 1966-02-18   Medicare Important Message Given:  Yes    Barb Merino [name] 02/28/2018, 4:49 PM

## 2018-02-28 NOTE — Progress Notes (Signed)
[facility] PROGRESS NOTE  Matthew [name] PIR:[id number] DOB: 12/06/1965 DOA: 02/21/2018  PCP: [name], MD  Brief History/Interval Summary: 52-year-old male with a past medical history of coronary artery disease status post bypass, history of cardiac arrest status post pacemaker/AICD, chronic kidney disease stage IV, diabetes mellitus type 2, hyperlipidemia, history of obstructive sleep apnea recently diagnosed hospitalized in April for hand cellulitis presented with somnolence falls patient was noted to have worsening renal function with a creatinine up to 7.4 from his baseline of 3.1.  He was also found to have urinary retention.  He was hospitalized for further management.    Reason for Visit: End-stage renal disease  Consultants: Nephrology  Procedures: Dialysis catheter placement.  AV fistula creation  Antibiotics: None  Subjective/Interval History: Patient was seen after came back from dialysis.  Denies any complaints.  Feels well.  Has not ambulated yet.  He uses a walker at home.  ROS: Denies any shortness of breath  Objective:  Vital Signs  Vitals:   02/28/18 0900 02/28/18 0930 02/28/18 1000 02/28/18 1030  BP: (!) 112/54 (!) 95/45 (!) 95/53 (!) 124/53  Pulse: (!) 55 (!) 44 (!) 43 (!) 43  Resp:    16  Temp:    98 F (36.7 C)  TempSrc:    Oral  SpO2:    96%  Weight:    121.7 kg  Height:        Intake/Output Summary (Last 24 hours) at 02/28/2018 1233 Last data filed at 02/28/2018 1030 Gross per 24 hour  Intake 1960 ml  Output 3400 ml  Net -1440 ml   Filed Weights   02/27/18 0413 02/28/18 0734 02/28/18 1030  Weight: 124.3 kg 125.4 kg 121.7 kg    General appearance: Awake alert.  In no distress Resp: Clear to auscultation bilaterally.  Normal effort. Cardio: S1-S2 is normal regular.  No S3-S4.  No rubs murmurs.  Bruit appreciated over the right arm from recent fistula creation. GI: Abdomen is soft.  Nontender nondistended Neurologic:  No focal neurological deficits  Lab Results:  Data Reviewed: I have personally reviewed following labs and imaging studies  CBC: Recent Labs  Lab 02/25/2018 2330 02/23/18 0003 02/24/18 0259 02/25/18 0250 02/18/2018 0251 02/28/18 0306  WBC 11.2*  --  8.3 8.9 8.2 7.9  NEUTROABS 9.2*  --  6.4 7.1  --   --   HGB 8.5* 8.5* 7.8* 7.9* 8.4* 7.5*  HCT 28.2* 25.0* 25.2* 25.9* 27.5* 24.4*  MCV 86.5  --  86.3 84.9 84.6 85.9  PLT 124*  --  117* 125* 138* 123*    Basic Metabolic Panel: Recent Labs  Lab 02/24/18 0259 02/25/18 0250 02/24/2018 0251 02/27/18 0322 02/28/18 0306  NA 138 137 139 141 138  K 3.6 3.6 3.9 4.0 4.1  CL 105 105 104 108 106  CO2 16* 21* 19* 20* 22  GLUCOSE 168* 134* 89 143* 135*  BUN 96* 98* 99* 73* 81*  CREATININE 7.74* 7.63* 7.63* 5.60* 6.21*  CALCIUM 8.3* 8.4* 8.8* 8.1* 8.7*  PHOS 9.6* 8.7* 8.4* 7.3* 7.3*    GFR: Estimated Creatinine Clearance: 17.5 mL/min (A) (by C-G formula based on SCr of 6.21 mg/dL (H)).  Liver Function Tests: Recent Labs  Lab 03/03/2018 2330 02/23/18 0554 02/24/18 0259 02/25/18 0250 02/19/2018 0251 02/27/18 0322 02/28/18 0306  AST 108* 96*  --   --   --   --   --   ALT 49* 52*  --   --   --   --   --  ALKPHOS 91 94  --   --   --   --   --   BILITOT 1.0 0.9  --   --   --   --   --   PROT 6.4* 6.3*  --   --   --   --   --   ALBUMIN 3.0* 2.8* 2.4* 2.6* 2.8* 2.3* 2.5*     Recent Labs  Lab 02/23/18 0006  AMMONIA 24    CBG: Recent Labs  Lab 02/27/18 0726 02/27/18 1148 02/27/18 1550 02/27/18 2051 02/28/18 1148  GLUCAP 150* 194* 177* 193* 126*     Radiology Studies: No results found.   Medications:  Scheduled: . aspirin EC  81 mg Oral Daily  . busPIRone  10 mg Oral BID  . calcitRIOL  0.5 mcg Oral Daily  . calcium acetate  1,334 mg Oral TID WC  . Chlorhexidine Gluconate Cloth  6 each Topical Q0600  . darbepoetin (ARANESP) injection - NON-DIALYSIS  150 mcg Subcutaneous Q Mon-1800  . FLUoxetine  20 mg Oral Daily    . heparin  5,000 Units Subcutaneous Q8H  . insulin aspart  0-5 Units Subcutaneous QHS  . insulin aspart  0-9 Units Subcutaneous TID WC  . insulin glargine  10 Units Subcutaneous BID  . metoprolol succinate  25 mg Oral Daily  . rosuvastatin  10 mg Oral q1800  . sodium bicarbonate  650 mg Oral TID  . tamsulosin  0.4 mg Oral Daily   Continuous: . sodium chloride    . sodium chloride     WUJ:WJXBJY chloride, sodium chloride, acetaminophen **OR** acetaminophen, alteplase, heparin, HYDROcodone-acetaminophen, lidocaine (PF), lidocaine-prilocaine, ondansetron **OR** ondansetron (ZOFRAN) IV, pentafluoroprop-tetrafluoroeth, sodium chloride    Assessment/Plan:  Acute renal failure superimposed on chronic kidney disease stage IV Initially thought to be due to urinary retention but there was no improvement despite Foley catheter placement.  Patient seen by nephrology.  No improvement in renal function since admission.  Dialysis recommended by nephrology.  Patient initially refused but then after further discussions he was agreeable.  Vascular surgery consulted.  Tunneled access placed on 12/24.  AV fistula created in the right arm.  Patient underwent his first dialysis session on 12/24.  Underwent dialysis this morning.  Waiting for him to be established in an outpatient dialysis center before he can be discharged.     Urinary retention This is resolved.  Patient found to have urinary retention at the time of admission.  Foley catheter was placed.  He has had good urine output.  Foley catheter was discontinued.  He has been able to void on his own.  Continue tamsulosin.   Acute metabolic encephalopathy This has resolved.  Most likely this is due to his uremia.  Imaging studies unremarkable.    Chronic combined systolic and diastolic CHF/AICD EF noted to be 45 to 50% based on echo done in May 2019.  Previously it has been lower.  Patient is status post AICD.  Volume to be now managed with  hemodialysis.  Currently on metoprolol.  Heart rate noted to be low this morning.  Continue to monitor for now.  He is already on a low dose.  Currently not on ACE inhibitor or ARB.  Insulin-dependent diabetes mellitus HbA1c 9.3 in April.  Managed at home with [place].  On a lower dose of Lantus here in the hospital.  Continue to monitor CBGs.  Anemia of chronic kidney disease No evidence of overt bleeding.  Hemoglobin is stable.  History  of depression and anxiety Continue home medications.  History of coronary artery disease status post CABG Stable.  On aspirin.  On beta-blocker.  Monitor heart rate closely.  Morbid obesity BMI 40  Obstructive sleep apnea Patient tells that he underwent sleep study recently. Progress note from 10/12 reviewed.  Found to have severe OSA with AHI of 62.1/h.  Patient actually was recommended BiPAP due to suboptimal CPAP titration. Continue CPAP here in the hospital.  His outpatient providers to pursue BiPAP.  Pressure injury Stage II - Partial thickness loss of dermis presenting as a shallow open ulcer with a red, pink wound bed without slough.Sacrum, posterior, medial.  Wound care.   DVT Prophylaxis: Subcutaneous heparin    Code Status: Full code Family Communication: Discussed with the patient Disposition Plan: Management as outlined above.  PT and OT as the patient uses a walker to ambulate at baseline.  Waiting for him to be established at an outpatient dialysis center before he can be discharged.    LOS: 4 days   [facility] Pager [phone] 02/28/2018, 12:33 PM  If 7PM-7AM, please contact night-coverage at [email or web address], password [email or web address]

## 2018-02-28 NOTE — Plan of Care (Signed)
Discussed with patient plan of care for the evening, pain management and when he wants to get his night time medications with some teach back displayed.

## 2018-02-28 NOTE — Procedures (Signed)
Patient seen on dialysis.  Awaiting disposition and clip.

## 2018-02-28 NOTE — Telephone Encounter (Signed)
sch appt lvm mld ltr 04/08/2018 1pm Dialysis Duplex 2pm p/o PA

## 2018-03-01 LAB — RENAL FUNCTION PANEL
Albumin: 2.4 g/dL — ABNORMAL LOW (ref 3.5–5.0)
Anion gap: 10 (ref 5–15)
BUN: 61 mg/dL — ABNORMAL HIGH (ref 6–20)
CHLORIDE: 103 mmol/L (ref 98–111)
CO2: 26 mmol/L (ref 22–32)
Calcium: 9 mg/dL (ref 8.9–10.3)
Creatinine, Ser: 5.05 mg/dL — ABNORMAL HIGH (ref 0.61–1.24)
GFR calc Af Amer: 14 mL/min — ABNORMAL LOW (ref >60)
GFR calc non Af Amer: 12 mL/min — ABNORMAL LOW (ref >60)
Glucose, Bld: 100 mg/dL — ABNORMAL HIGH (ref 70–99)
POTASSIUM: 4.4 mmol/L (ref 3.5–5.1)
Phosphorus: 6.6 mg/dL — ABNORMAL HIGH (ref 2.5–4.6)
Sodium: 139 mmol/L (ref 135–145)

## 2018-03-01 LAB — GLUCOSE, CAPILLARY
Glucose-Capillary: 90 mg/dL (ref 70–99)
Glucose-Capillary: 122 mg/dL — ABNORMAL HIGH (ref 70–99)
Glucose-Capillary: 94 mg/dL (ref 70–99)
Glucose-Capillary: 103 mg/dL — ABNORMAL HIGH (ref 70–99)

## 2018-03-01 MED ORDER — RENA-VITE PO TABS
1.0000 | ORAL_TABLET | Freq: Every day | ORAL | Status: DC
  Administered 2018-03-01 – 2018-03-07 (×7): 1 via ORAL
  Filled 2018-03-01 (×7): qty 1

## 2018-03-01 MED ORDER — METOPROLOL SUCCINATE ER 25 MG PO TB24: 12.5000 mg | ORAL_TABLET | Freq: Every day | ORAL | Status: DC

## 2018-03-01 MED ORDER — CHLORHEXIDINE GLUCONATE CLOTH 2 % EX PADS
6.0000 | MEDICATED_PAD | Freq: Every day | CUTANEOUS | Status: DC
  Administered 2018-03-03 – 2018-03-04 (×2): 6 via TOPICAL

## 2018-03-01 MED ORDER — DARBEPOETIN ALFA 200 MCG/0.4ML IJ SOSY
200.0000 ug | PREFILLED_SYRINGE | INTRAMUSCULAR | Status: DC
  Filled 2018-03-01: qty 0.4

## 2018-03-01 MED ORDER — CALCITRIOL 0.25 MCG PO CAPS
1.0000 ug | ORAL_CAPSULE | ORAL | Status: DC
  Administered 2018-03-03: 1 ug via ORAL
  Filled 2018-03-01 (×3): qty 2

## 2018-03-01 MED ORDER — SODIUM CHLORIDE 0.9 % IV SOLN
250.0000 mg | Freq: Every day | INTRAVENOUS | Status: AC
  Administered 2018-03-01: 250 mg via INTRAVENOUS
  Filled 2018-03-01 (×2): qty 20

## 2018-03-01 MED ORDER — METOPROLOL SUCCINATE ER 25 MG PO TB24
12.5000 mg | ORAL_TABLET | Freq: Every day | ORAL | Status: DC
  Administered 2018-03-01 – 2018-03-06 (×5): 12.5 mg via ORAL
  Filled 2018-03-01 (×5): qty 1

## 2018-03-01 NOTE — Progress Notes (Addendum)
Subjective: Interval History: has complaints concerned about transportation to unit..  Objective: Vital signs in last 24 hours: Temp:  [97.8 F (36.6 C)-98.5 F (36.9 C)] 97.8 F (36.6 C) (12/27 0728) Pulse Rate:  [42-88] 42 (12/27 0728) Resp:  [12-18] 18 (12/27 0728) BP: (106-138)/(57-75) 106/57 (12/27 0728) SpO2:  [91 %-100 %] 98 % (12/27 0728) Weight:  [121.2 kg] 121.2 kg (12/27 0500) Weight change:   Intake/Output from previous day: 12/26 0701 - 12/27 0700 In: -  Out: 2900 [Urine:400] Intake/Output this shift: Total I/O In: 240 [P.O.:240] Out: -   General appearance: alert, cooperative, no distress and moderately obese Resp: diminished breath sounds bilaterally Chest wall: RIJ cath Cardio: S1, S2 normal and systolic murmur: holosystolic 2/6, blowing at apex GI: obese, pos bs, liver down 5 cm Extremities: aVF RUA, B&T,  2+ edema., thenar atrophy  Lab Results: Recent Labs    02/28/18 0306  WBC 7.9  HGB 7.5*  HCT 24.4*  PLT 123*   BMET:  Recent Labs    02/28/18 0306 03/01/18 0229  NA 138 139  K 4.1 4.4  CL 106 103  CO2 22 26  GLUCOSE 135* 100*  BUN 81* 61*  CREATININE 6.21* 5.05*  CALCIUM 8.7* 9.0   No results for input(s): PTH in the last 72 hours. Iron Studies: No results for input(s): IRON, TIBC, TRANSFERRIN, FERRITIN in the last 72 hours.  Studies/Results: No results found.  I have reviewed the patient's current medications.  Assessment/Plan: 1 ESRD  New .  Vol xs. 3rd HD tomorrow.  Needs slow tx yet.  Has time slot TTS at [place], Next HD on Mon via holiday schedule. 2 Anemia getting Fe, esa maximize 3 HPTH needs PTH, use qod Vit D 4 Obesity 5 DM 6 CAD 7 Pacer/ICD P HD, esa, Fe, vit D, check PTH    LOS: 5 days   Jeneen Rinks Briel Gallicchio 03/01/2018,2:08 PM  Apparently a financial issue has come up with his acceptance to dialysis unit and will have to delay d/c.  Will need transportation arranged also

## 2018-03-01 NOTE — Progress Notes (Signed)
VAST RN called unit RN to inquire about consult for PIV placement. Spoke with unit RN who stated she had not put in consult, but just returned from lunch. VAST RN discussed plan of care with unit RN as pt does not have any IVF or IV med orders at this time, other than PRN boluses. Unit RN stated he may need another IV tomorrow for Iron infusion, but at this time pt does not need IV access.

## 2018-03-01 NOTE — Evaluation (Signed)
Occupational Therapy Evaluation Patient Details Name: Matthew [name] MRN: [id number] DOB: 1965-05-14 Today's Date: 03/01/2018    History of Present Illness This 52 y.o. male admitted with somnolence adn falls.   Dx:  acute renal failure superimposed on CKD IV.  Pt started on ESRD 03/05/2018, acute encephalopathy, chronic combined systolic and diastolic HF.  PMH includes:  Bipolar disorder, IDDM, CAD,  OSA, diabetic polyneuropathy, food drop bilaterally,  morbid obesity    Clinical Impression   Pt admitted with above. He demonstrates the below listed deficits and will benefit from continued OT to maximize safety and independence with BADLs.  Pt presents to OT with generalized weakness, decreased balance, impaired cognition including poor awareness of deficits, decreased safety awareness.   Pt currently requires min A for ADLs, and fatigues quickly requiring multiple rest breaks. He can only tolerate standing for up to 30 mins at a time before having to sit due to back pain and fatigue.  He initially states he can return home and care for father with no difficulty, however, when limitations pointed out to him, he concedes he may need post acute rehab.  Per CIR screen, pt does not have a qualifying diagnosis for CIR (which would be optimal for this pt), so SNF is instead recommended.       Follow Up Recommendations  SNF;Supervision/Assistance - 24 hour    Equipment Recommendations  3 in 1 bedside commode    Recommendations for Other Services       Precautions / Restrictions Precautions Precautions: Fall      Mobility Bed Mobility Overal bed mobility: Modified Independent                Transfers Overall transfer level: Needs assistance Equipment used: Rolling walker (2 wheeled) Transfers: Sit to/from [facility] Sit to Stand: Min guard Stand pivot transfers: Min guard       General transfer comment: close min guard as he fatigues     Balance Overall  balance assessment: Needs assistance Sitting-balance support: Feet supported Sitting balance-Leahy Scale: Good     Standing balance support: Bilateral upper extremity supported;During functional activity;Single extremity supported Standing balance-Leahy Scale: Poor Standing balance comment: requires UE support                            ADL either performed or assessed with clinical judgement   ADL Overall ADL's : Needs assistance/impaired Eating/Feeding: Independent   Grooming: Wash/dry hands;Wash/dry face;Oral care;Brushing hair;Set up;Sitting   Upper Body Bathing: Set up;Supervision/ safety;Sitting   Lower Body Bathing: Minimal assistance;Sit to/from stand Lower Body Bathing Details (indicate cue type and reason): assist for thoroughness.  Requires rest breaks  Upper Body Dressing : Set up;Sitting   Lower Body Dressing: Minimal assistance;Sit to/from stand   Toilet Transfer: Min Geophysical data processor Details (indicate cue type and reason): close min guard assist due to being tremulous  Toileting- Clothing Manipulation and Hygiene: Minimal assistance;Sit to/from stand       Functional mobility during ADLs: Min guard;Rolling walker General ADL Comments: Pt fatigues quickly.  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, and has diplopia      Perception     Praxis      Pertinent Vitals/Pain Pain Assessment: Faces Faces Pain Scale: Hurts a little  bit Pain Location: low back Pain Descriptors / Indicators: Spasm Pain Intervention(s): Monitored during session;Limited activity within patient's tolerance;Repositioned     Hand Dominance Right   Extremity/Trunk Assessment Upper Extremity Assessment Upper Extremity Assessment:  RUE deficits/detail;LUE deficits/detail;Generalized weakness RUE Deficits / Details: bil UEs tremulous RUE Coordination: decreased fine motor LUE Deficits / Details: bil UEs tremulous LUE Coordination: decreased fine motor   Lower Extremity Assessment Lower Extremity Assessment: Defer to PT evaluation   Cervical / Trunk Assessment Cervical / Trunk Assessment: Normal   Communication Communication Communication: No difficulties   Cognition Arousal/Alertness: Awake/alert Behavior During Therapy: WFL for tasks assessed/performed Overall Cognitive Status: Impaired/Different from baseline Area of Impairment: Attention;Safety/judgement;Awareness;Problem solving                   Current Attention Level: Alternating     Safety/Judgement: Decreased awareness of deficits;Decreased awareness of safety Awareness: Intellectual Problem Solving: Requires verbal cues General Comments: Pt unable to generalize his current deficits into his daily activiites. He states he will be fine to take care of himself, to cook, and to drive to HD.  Once I had a direct conversation with him after he could not stand longer than 30 seconds and he conceeded that he might need post acute rehab    General Comments  Pt only able to tolerate static standing x 30 seconds (performed multiple times), but fatigues requiring him to sit.      Exercises     Shoulder Instructions      Home Living Family/patient expects to be discharged to:: Private residence Living Arrangements: Parent Available Help at Discharge: Family;Friend(s) Type of Home: House Home Access: Stairs to enter [employer] of Steps: 2-3 Entrance Stairs-Rails: Left;Right;Can reach both Home Layout: Two level Alternate Level Stairs-Number of Steps: has chair lift that can transport to second level if needed; flight of stairs Alternate Level Stairs-Rails: Right Bathroom Shower/Tub: Walk-in shower;Tub/shower unit   Bathroom Toilet:  Standard     Home Equipment: Cane - single point;Walker - 2 wheels;Shower seat;Bedside commode   Additional Comments: Pt reports he can stay on first level and sleep on couch       Prior Functioning/Environment Level of Independence: Independent with assistive device(s)        Comments: uses cane vs walker for ambulation         OT Problem List: Decreased strength;Decreased activity tolerance;Impaired balance (sitting and/or standing);Decreased cognition;Decreased safety awareness;Decreased knowledge of use of DME or AE;Obesity      OT Treatment/Interventions: Self-care/ADL training;Therapeutic exercise;DME and/or AE instruction;Therapeutic activities;Cognitive remediation/compensation;Patient/family education;Balance training    OT Goals(Current goals can be found in the care plan section) Acute Rehab OT Goals Patient Stated Goal: to go home and take care of father  OT Goal Formulation: With patient Time For Goal Achievement: 03/15/18 Potential to Achieve Goals: Good  OT Frequency: Min 2X/week   Barriers to D/C: Decreased caregiver support          Co-evaluation              AM-PAC OT "6 Clicks" Daily Activity     Outcome Measure Help from another person eating meals?: None Help from another person taking care of personal grooming?: A Little Help from another person toileting, which includes using toliet, bedpan, or urinal?: A Little Help from another person bathing (including washing, rinsing, drying)?: A Little Help from another person to put on and taking off regular upper body clothing?: A Little Help from another person to put on  and taking off regular lower body clothing?: A Little 6 Click Score: 19   End of Session Equipment Utilized During Treatment: Rolling walker;Gait belt Nurse Communication: Mobility status  Activity Tolerance: Patient limited by fatigue Patient left: in chair;with call bell/phone within reach  OT Visit Diagnosis: Unsteadiness on  feet (R26.81)                Time: 1250-1318 OT Time Calculation (min): 28 min Charges:  OT General Charges $OT Visit: 1 Visit OT Evaluation $OT Eval Moderate Complexity: 1 Mod OT Treatments $Self Care/Home Management : 8-22 mins  Lucille Passy, OTR/L [place] Pager [phone] Office [phone]   Lucille Passy M 03/01/2018, 2:07 PM

## 2018-03-01 NOTE — Progress Notes (Signed)
Physical Therapy Treatment Patient Details Name: Matthew [name] MRN: [id number] DOB: 01-May-1965 Today's Date: 03/01/2018    History of Present Illness Pt is a 52 y.o. M with significant PMH of CAD s/p bypass, history of cardiac arrest s/p pacemaker/AICD, chronic kidney disease stage IV, diabetes mellitus type 2, hyperlipidemia, who was admitted for worsening renal function.      PT Comments    Patient with mild progress today with therapy. Session focused on bolstering independence with standing and short distance ambulation in room. Ambulated 20' (5' 5' 5' 5'), weak and unsteady requests to sit down early.  I agree with CIR recs from initial PT evaluation, as patient motivated and needs to be able to care for elderly father at home.      Follow Up Recommendations  CIR     Equipment Recommendations  None recommended by PT    Recommendations for Other Services       Precautions / Restrictions Precautions Precautions: Fall Restrictions Weight Bearing Restrictions: No    Mobility  Bed Mobility               General bed mobility comments: OOB in chair  Transfers Overall transfer level: Needs assistance Equipment used: Rolling walker (2 wheeled) Transfers: Sit to/from Stand Sit to Stand: Min guard         General transfer comment: min guard today for standing  Ambulation/Gait Ambulation/Gait assistance: Min assist;Min guard Gait Distance (Feet): 20 Feet(5' 5' 5' 5') Assistive device: Rolling walker (2 wheeled) Gait Pattern/deviations: Step-through pattern;Decreased stride length;Trunk flexed Gait velocity: decreased   General Gait Details: min A at times for stability, patient walking short distances in room from chair and backwards into chair. fearful of turning corners in hospital room at this time. feels imbalanced and weak    Marine [employer] [place] (Stroke Patients Only)       Balance Overall balance  assessment: Needs assistance Sitting-balance support: Feet supported Sitting balance-Leahy Scale: Good     Standing balance support: Bilateral upper extremity supported Standing balance-Leahy Scale: Poor                              Cognition Arousal/Alertness: Awake/alert Behavior During Therapy: WFL for tasks assessed/performed Overall Cognitive Status: Within Functional Limits for tasks assessed                                        Exercises Other Exercises Other Exercises: Serial sit to stands x 5 Other Exercises: Seated LAQ's x 10    General Comments        Pertinent Vitals/Pain Pain Assessment: Faces Faces Pain Scale: Hurts a little bit Pain Location: low back Pain Descriptors / Indicators: Spasm Pain Intervention(s): Limited activity within patient's tolerance;Monitored during session    Home Living                      Prior Function            PT Goals (current goals can now be found in the care plan section) Acute Rehab PT Goals Patient Stated Goal: get stronger PT Goal Formulation: With patient Time For Goal Achievement: 03/14/18 Potential to Achieve Goals: Good Progress towards PT goals: Progressing toward goals  Frequency    Min 3X/week      PT Plan Current plan remains appropriate    Co-evaluation              AM-PAC PT "6 Clicks" Mobility   Outcome Measure  Help needed turning from your back to your side while in a flat bed without using bedrails?: None Help needed moving from lying on your back to sitting on the side of a flat bed without using bedrails?: A Little Help needed moving to and from a bed to a chair (including a wheelchair)?: A Little Help needed standing up from a chair using your arms (e.g., wheelchair or bedside chair)?: A Little Help needed to walk in hospital room?: A Little Help needed climbing 3-5 steps with a railing? : A Lot 6 Click Score: 18    End of Session  Equipment Utilized During Treatment: Gait belt Activity Tolerance: Patient tolerated treatment well Patient left: in chair;with call bell/phone within reach;with chair alarm set Nurse Communication: Mobility status PT Visit Diagnosis: Unsteadiness on feet (R26.81);Difficulty in walking, not elsewhere classified (R26.2)     Time: 9476-5465 PT Time Calculation (min) (ACUTE ONLY): 29 min  Charges:  $Gait Training: 8-22 mins $Therapeutic Activity: 8-22 mins                    Reinaldo Berber, PT, DPT Acute Rehabilitation Services Pager: [phone] Office: [phone]     Reinaldo Berber 03/01/2018, 9:34 AM

## 2018-03-01 NOTE — Progress Notes (Signed)
Patient refused CPAP at this time.

## 2018-03-01 NOTE — Progress Notes (Signed)
[facility] PROGRESS NOTE  Matthew [name] EXB:[id number] DOB: [birth year] DOA: 02/23/2018  PCP: [name], MD  Brief History/Interval Summary: 52-year-old male with a past medical history of coronary artery disease status post bypass, history of cardiac arrest status post pacemaker/AICD, chronic kidney disease stage IV, diabetes mellitus type 2, hyperlipidemia, history of obstructive sleep apnea recently diagnosed hospitalized in April for hand cellulitis presented with somnolence falls patient was noted to have worsening renal function with a creatinine up to 7.4 from his baseline of 3.1.  He was also found to have urinary retention.  He was hospitalized for further management.    Reason for Visit: End-stage renal disease  Consultants: Nephrology  Procedures: Dialysis catheter placement.  AV fistula creation  Antibiotics: None  Subjective/Interval History: Patient states that he is doing well.  Denies any shortness of breath.  No chest pain.  No nausea vomiting.  Inpatient rehabilitation was discussed.  Patient does not want to pursue this at this time.  He states that he would like to go home to take care of his father.  He also declines skilled nursing facility.   ROS: Denies any headaches  Objective:  Vital Signs  Vitals:   02/28/18 1659 02/28/18 2318 03/01/18 0500 03/01/18 0728  BP: 127/75 136/73  (!) 106/57  Pulse: 88 81  (!) 42  Resp: 18 14  18   Temp: 98.5 F (36.9 C) 98 F (36.7 C)  97.8 F (36.6 C)  TempSrc: Oral Oral  Oral  SpO2: 91% 100%  98%  Weight:   121.2 kg   Height:        Intake/Output Summary (Last 24 hours) at 03/01/2018 1007 Last data filed at 03/01/2018 0300 Gross per 24 hour  Intake -  Output 2900 ml  Net -2900 ml   Filed Weights   02/28/18 0734 02/28/18 1030 03/01/18 0500  Weight: 125.4 kg 121.7 kg 121.2 kg    General appearance: Awake alert.  In no distress Resp: Clear to auscultation bilaterally.  Normal  effort Cardio: S1-S2 normal regular.  No S3-S4.   GI: Abdomen soft.  Nontender nondistended Neurologic: No focal neurological deficits  Lab Results:  Data Reviewed: I have personally reviewed following labs and imaging studies  CBC: Recent Labs  Lab 02/25/2018 2330 02/23/18 0003 02/24/18 0259 02/25/18 0250 02/08/2018 0251 02/28/18 0306  WBC 11.2*  --  8.3 8.9 8.2 7.9  NEUTROABS 9.2*  --  6.4 7.1  --   --   HGB 8.5* 8.5* 7.8* 7.9* 8.4* 7.5*  HCT 28.2* 25.0* 25.2* 25.9* 27.5* 24.4*  MCV 86.5  --  86.3 84.9 84.6 85.9  PLT 124*  --  117* 125* 138* 123*    Basic Metabolic Panel: Recent Labs  Lab 02/25/18 0250 03/03/2018 0251 02/27/18 0322 02/28/18 0306 03/01/18 0229  NA 137 139 141 138 139  K 3.6 3.9 4.0 4.1 4.4  CL 105 104 108 106 103  CO2 21* 19* 20* 22 26  GLUCOSE 134* 89 143* 135* 100*  BUN 98* 99* 73* 81* 61*  CREATININE 7.63* 7.63* 5.60* 6.21* 5.05*  CALCIUM 8.4* 8.8* 8.1* 8.7* 9.0  PHOS 8.7* 8.4* 7.3* 7.3* 6.6*    GFR: Estimated Creatinine Clearance: 21.5 mL/min (A) (by C-G formula based on SCr of 5.05 mg/dL (H)).  Liver Function Tests: Recent Labs  Lab 02/12/2018 2330 02/23/18 0554  02/25/18 0250 02/20/2018 0251 02/27/18 0322 02/28/18 0306 03/01/18 0229  AST 108* 96*  --   --   --   --   --   --  ALT 49* 52*  --   --   --   --   --   --   ALKPHOS 91 94  --   --   --   --   --   --   BILITOT 1.0 0.9  --   --   --   --   --   --   PROT 6.4* 6.3*  --   --   --   --   --   --   ALBUMIN 3.0* 2.8*   < > 2.6* 2.8* 2.3* 2.5* 2.4*   < > = values in this interval not displayed.     Recent Labs  Lab 02/23/18 0006  AMMONIA 24    CBG: Recent Labs  Lab 02/27/18 2051 02/28/18 1148 02/28/18 1711 02/28/18 2048 03/01/18 0730  GLUCAP 193* 126* 115* 96 90     Radiology Studies: No results found.   Medications:  Scheduled: . aspirin EC  81 mg Oral Daily  . busPIRone  10 mg Oral BID  . calcitRIOL  0.5 mcg Oral Daily  . calcium acetate  1,334 mg Oral  TID WC  . Chlorhexidine Gluconate Cloth  6 each Topical Q0600  . [START ON 03/02/2018] darbepoetin (ARANESP) injection - DIALYSIS  150 mcg Intravenous Q Sat-HD  . FLUoxetine  20 mg Oral Daily  . heparin  5,000 Units Subcutaneous Q8H  . insulin aspart  0-5 Units Subcutaneous QHS  . insulin aspart  0-9 Units Subcutaneous TID WC  . insulin glargine  10 Units Subcutaneous BID  . metoprolol succinate  25 mg Oral Daily  . rosuvastatin  10 mg Oral q1800  . tamsulosin  0.4 mg Oral Daily   Continuous: . sodium chloride    . sodium chloride    . ferric gluconate (FERRLECIT/NULECIT) IV     FIE:PPIRJJ chloride, sodium chloride, acetaminophen **OR** acetaminophen, alteplase, heparin, HYDROcodone-acetaminophen, lidocaine (PF), lidocaine-prilocaine, ondansetron **OR** ondansetron (ZOFRAN) IV, pentafluoroprop-tetrafluoroeth, sodium chloride, zolpidem    Assessment/Plan:  Acute renal failure superimposed on chronic kidney disease stage IV Initially thought to be due to urinary retention but there was no improvement despite Foley catheter placement.  Dialysis recommended by nephrology.  Patient initially refused but then after further discussions he was agreeable.  Vascular surgery consulted.  Tunneled access placed on 12/24.  AV fistula created in the right arm.  He has been dialyzed twice during this hospitalization.  It appears that he has been established at the dialysis center in [facility]  Plan is to begin treatment on December 31.  Discussed with nephrology.  He will likely need to be dialyzed here tomorrow prior to discharge.     Urinary retention This is resolved.  Patient found to have urinary retention at the time of admission.  Foley catheter was placed.  He has had good urine output.  Foley catheter was discontinued.  He has been able to void on his own.  Continue tamsulosin.   Acute metabolic encephalopathy This was most likely due to uremia.  Now resolved.  Chronic combined systolic  and diastolic CHF/AICD EF noted to be 45 to 50% based on echo done in May 2019.  Previously it has been lower.  Patient is status post AICD.  Volume to be now managed with hemodialysis.  Currently on metoprolol.  Heart rate low at times but also noted to be in the 80s.  Continue to monitor for now.  Currently not on ACE inhibitor or ARB.  Insulin-dependent diabetes mellitus  HbA1c 9.3 in April.  Managed at home with [place].  On a lower dose of Lantus here in the hospital.  Continue to monitor CBGs.  Anemia of chronic kidney disease No evidence of overt bleeding.  Hemoglobin is stable.  History of depression and anxiety Continue home medications.  History of coronary artery disease status post CABG Stable.  On aspirin.  On beta-blocker.  Continue to monitor heart rate.  Morbid obesity BMI 40  Obstructive sleep apnea Patient tells that he underwent sleep study recently. Progress note from 10/12 reviewed.  Found to have severe OSA with AHI of 62.1/h.  Patient actually was recommended BiPAP due to suboptimal CPAP titration. Continue CPAP here in the hospital.  His outpatient providers to pursue BiPAP.  Pressure injury Stage II - Partial thickness loss of dermis presenting as a shallow open ulcer with a red, pink wound bed without slough.Sacrum, posterior, medial.  Wound care.   DVT Prophylaxis: Subcutaneous heparin    Code Status: Full code Family Communication: Discussed with the patient Disposition Plan: Await final plan per nephrology regarding dialysis.  Hopefully discharge tomorrow after his dialysis.  Patient declines inpatient rehab and SNF as he has to take care of an elderly father at home.  He might benefit from home health.    LOS: 5 days   [facility] Pager [phone] 03/01/2018, 10:07 AM  If 7PM-7AM, please contact night-coverage at [email or web address], password [facility]

## 2018-03-02 LAB — GLUCOSE, CAPILLARY
GLUCOSE-CAPILLARY: 147 mg/dL — AB (ref 70–99)
Glucose-Capillary: 71 mg/dL (ref 70–99)
Glucose-Capillary: 140 mg/dL — ABNORMAL HIGH (ref 70–99)

## 2018-03-02 LAB — RENAL FUNCTION PANEL
Albumin: 2.6 g/dL — ABNORMAL LOW (ref 3.5–5.0)
Anion gap: 11 (ref 5–15)
BUN: 69 mg/dL — ABNORMAL HIGH (ref 6–20)
CHLORIDE: 103 mmol/L (ref 98–111)
CO2: 24 mmol/L (ref 22–32)
Calcium: 8.9 mg/dL (ref 8.9–10.3)
Creatinine, Ser: 5.41 mg/dL — ABNORMAL HIGH (ref 0.61–1.24)
GFR calc Af Amer: 13 mL/min — ABNORMAL LOW (ref >60)
GFR calc non Af Amer: 11 mL/min — ABNORMAL LOW (ref >60)
Glucose, Bld: 115 mg/dL — ABNORMAL HIGH (ref 70–99)
POTASSIUM: 4.5 mmol/L (ref 3.5–5.1)
Phosphorus: 6.4 mg/dL — ABNORMAL HIGH (ref 2.5–4.6)
Sodium: 138 mmol/L (ref 135–145)

## 2018-03-02 LAB — CBC
HEMATOCRIT: 24.3 % — AB (ref 39.0–52.0)
Hemoglobin: 7.2 g/dL — ABNORMAL LOW (ref 13.0–17.0)
MCH: 25.9 pg — ABNORMAL LOW (ref 26.0–34.0)
MCHC: 29.6 g/dL — ABNORMAL LOW (ref 30.0–36.0)
MCV: 87.4 fL (ref 80.0–100.0)
Platelets: 140 10*3/uL — ABNORMAL LOW (ref 150–400)
RBC: 2.78 MIL/uL — ABNORMAL LOW (ref 4.22–5.81)
RDW: 15 % (ref 11.5–15.5)
WBC: 9.3 10*3/uL (ref 4.0–10.5)
nRBC: 0.2 % (ref 0.0–0.2)

## 2018-03-02 MED ORDER — LIDOCAINE HCL (PF) 1 % IJ SOLN: 5.0000 mL | INTRAMUSCULAR | Status: DC | PRN

## 2018-03-02 MED ORDER — ALTEPLASE 2 MG IJ SOLR: 2.0000 mg | Freq: Once | INTRAMUSCULAR | Status: DC | PRN

## 2018-03-02 MED ORDER — SODIUM CHLORIDE 0.9 % IV SOLN: 100.0000 mL | INTRAVENOUS | Status: DC | PRN

## 2018-03-02 MED ORDER — HEPARIN SODIUM (PORCINE) 1000 UNIT/ML DIALYSIS: 100.0000 [IU]/kg | INTRAMUSCULAR | Status: DC | PRN

## 2018-03-02 MED ORDER — PENTAFLUOROPROP-TETRAFLUOROETH EX AERO: 1.0000 "application " | INHALATION_SPRAY | CUTANEOUS | Status: DC | PRN

## 2018-03-02 MED ORDER — LIDOCAINE-PRILOCAINE 2.5-2.5 % EX CREA: 1.0000 "application " | TOPICAL_CREAM | CUTANEOUS | Status: DC | PRN

## 2018-03-02 MED ORDER — HEPARIN SODIUM (PORCINE) 1000 UNIT/ML IJ SOLN
INTRAMUSCULAR | Status: AC
  Filled 2018-03-02: qty 4

## 2018-03-02 MED ORDER — HEPARIN SODIUM (PORCINE) 1000 UNIT/ML DIALYSIS: 1000.0000 [IU] | INTRAMUSCULAR | Status: DC | PRN

## 2018-03-02 MED ORDER — INSULIN GLARGINE 100 UNIT/ML ~~LOC~~ SOLN
10.0000 [IU] | Freq: Every day | SUBCUTANEOUS | Status: DC
  Administered 2018-03-03 – 2018-03-05 (×3): 10 [IU] via SUBCUTANEOUS
  Filled 2018-03-02 (×4): qty 0.1

## 2018-03-02 NOTE — Progress Notes (Signed)
[facility] PROGRESS NOTE  Amani Nodarse HDQ:[id number] DOB: 1965-10-26 DOA: 02/20/2018  PCP: [name], MD  Brief History/Interval Summary: 52-year-old male with a past medical history of coronary artery disease status post bypass, history of cardiac arrest status post pacemaker/AICD, chronic kidney disease stage IV, diabetes mellitus type 2, hyperlipidemia, history of obstructive sleep apnea recently diagnosed hospitalized in April for hand cellulitis presented with somnolence falls patient was noted to have worsening renal function with a creatinine up to 7.4 from his baseline of 3.1.  He was also found to have urinary retention.  He was hospitalized for further management.    Reason for Visit: End-stage renal disease  Consultants: Nephrology  Procedures:  Dialysis catheter placement.  AV fistula creation.    Hemodialysis  Antibiotics: None  Subjective/Interval History: Patient seen while he was getting dialyzed.  Denies any complaints.  He is now agreeable to go to skilled nursing facility for short-term rehab.     ROS: Denies any headaches  Objective:  Vital Signs  Vitals:   03/02/18 1000 03/02/18 1030 03/02/18 1100 03/02/18 1129  BP: (!) 123/51 (!) 114/54 (!) 130/59 (!) 120/59  Pulse: 81 89 86 85  Resp:   17 14  Temp:    98.3 F (36.8 C)  TempSrc:    Oral  SpO2:    96%  Weight:      Height:        Intake/Output Summary (Last 24 hours) at 03/02/2018 1204 Last data filed at 03/02/2018 1129 Gross per 24 hour  Intake 254.13 ml  Output 3000 ml  Net -2745.87 ml   Filed Weights   02/28/18 1030 03/01/18 0500 03/02/18 0600  Weight: 121.7 kg 121.2 kg 120.4 kg    General appearance: Awake and alert.  In no distress. Resp: Clear to auscultation bilaterally.  Normal effort Cardio: S1-S2 is normal regular.  No S3-S4.  No rubs murmurs or bruit GI: Abdomen is soft.  Nontender.  Nondistended. Neurologic: No focal neurological deficits.  Lab  Results:  Data Reviewed: I have personally reviewed following labs and imaging studies  CBC: Recent Labs  Lab 02/24/18 0259 02/25/18 0250 02/19/2018 0251 02/28/18 0306 03/02/18 0242  WBC 8.3 8.9 8.2 7.9 9.3  NEUTROABS 6.4 7.1  --   --   --   HGB 7.8* 7.9* 8.4* 7.5* 7.2*  HCT 25.2* 25.9* 27.5* 24.4* 24.3*  MCV 86.3 84.9 84.6 85.9 87.4  PLT 117* 125* 138* 123* 140*    Basic Metabolic Panel: Recent Labs  Lab 03/02/2018 0251 02/27/18 0322 02/28/18 0306 03/01/18 0229 03/02/18 0242  NA 139 141 138 139 138  K 3.9 4.0 4.1 4.4 4.5  CL 104 108 106 103 103  CO2 19* 20* 22 26 24   GLUCOSE 89 143* 135* 100* 115*  BUN 99* 73* 81* 61* 69*  CREATININE 7.63* 5.60* 6.21* 5.05* 5.41*  CALCIUM 8.8* 8.1* 8.7* 9.0 8.9  PHOS 8.4* 7.3* 7.3* 6.6* 6.4*    GFR: Estimated Creatinine Clearance: 20 mL/min (A) (by C-G formula based on SCr of 5.41 mg/dL (H)).  Liver Function Tests: Recent Labs  Lab 02/23/2018 0251 02/27/18 0322 02/28/18 0306 03/01/18 0229 03/02/18 0242  ALBUMIN 2.8* 2.3* 2.5* 2.4* 2.6*     CBG: Recent Labs  Lab 03/01/18 0730 03/01/18 1145 03/01/18 1711 03/01/18 2108 03/02/18 0741  GLUCAP 90 122* 94 103* 71     Radiology Studies: No results found.   Medications:  Scheduled: . aspirin EC  81 mg Oral Daily  .  busPIRone  10 mg Oral BID  . [START ON 03/03/2018] calcitRIOL  1 mcg Oral QODAY  . calcium acetate  1,334 mg Oral TID WC  . Chlorhexidine Gluconate Cloth  6 each Topical Q0600  . Chlorhexidine Gluconate Cloth  6 each Topical Q0600  . darbepoetin (ARANESP) injection - DIALYSIS  200 mcg Intravenous Q Sat-HD  . FLUoxetine  20 mg Oral Daily  . heparin      . heparin  5,000 Units Subcutaneous Q8H  . insulin aspart  0-5 Units Subcutaneous QHS  . insulin aspart  0-9 Units Subcutaneous TID WC  . insulin glargine  10 Units Subcutaneous BID  . metoprolol succinate  12.5 mg Oral QHS  . multivitamin  1 tablet Oral QHS  . rosuvastatin  10 mg Oral q1800  .  tamsulosin  0.4 mg Oral Daily   Continuous: . sodium chloride    . sodium chloride    . sodium chloride    . sodium chloride    . ferric gluconate (FERRLECIT/NULECIT) IV 250 mg (03/01/18 1123)   ION:GEXBMW chloride, sodium chloride, sodium chloride, sodium chloride, acetaminophen **OR** acetaminophen, alteplase, alteplase, heparin, heparin, heparin, HYDROcodone-acetaminophen, lidocaine (PF), lidocaine (PF), lidocaine-prilocaine, lidocaine-prilocaine, ondansetron **OR** ondansetron (ZOFRAN) IV, pentafluoroprop-tetrafluoroeth, pentafluoroprop-tetrafluoroeth, sodium chloride, zolpidem    Assessment/Plan:  Acute renal failure superimposed on chronic kidney disease stage IV Initially thought to be due to urinary retention but there was no improvement despite Foley catheter placement.  Dialysis recommended by nephrology.  Patient initially refused but then after further discussions he was agreeable.  Vascular surgery consulted.  Tunneled access placed on 12/24.  AV fistula created in the right arm.  Patient has been dialyzed multiple times during this hospital stay.  He has been found a spot in an outpatient dialysis center but financial issues not resolved completely.  So he will need to stay in the hospital for now.     Urinary retention This is resolved.  Patient found to have urinary retention at the time of admission.  Foley catheter was placed.  He has had good urine output.  Foley catheter was discontinued.  He has been able to void on his own.  Continue tamsulosin.   Acute metabolic encephalopathy This was most likely due to uremia.  Now resolved.  Chronic combined systolic and diastolic CHF/AICD EF noted to be 45 to 50% based on echo done in May 2019.  Previously it has been lower.  Patient is status post AICD.  Volume to be now managed with hemodialysis.  Currently on metoprolol.  Heart rate low at times but also noted to be in the 80s.  Dose of metoprolol was decreased.  Continue to  monitor.  Insulin-dependent diabetes mellitus HbA1c 9.3 in April.  Managed at home with [place].  On a lower dose of Lantus here in the hospital.  CBGs noted to be borderline low.  We will cut back on the dose of his Lantus.  Anemia of chronic kidney disease Hemoglobin is low but stable.  No evidence of overt bleeding.  History of depression and anxiety Continue home medications.  History of coronary artery disease status post CABG Stable.  On aspirin.  On beta-blocker.  Continue to monitor heart rate.  Morbid obesity BMI 40  Obstructive sleep apnea Patient tells that he underwent sleep study recently. Progress note from 10/12 reviewed.  Found to have severe OSA with AHI of 62.1/h.  Patient actually was recommended BiPAP due to suboptimal CPAP titration. Continue CPAP here in the hospital.  His outpatient providers to pursue BiPAP.  Pressure injury Stage II - Partial thickness loss of dermis presenting as a shallow open ulcer with a red, pink wound bed without slough.Sacrum, posterior, medial.  Wound care.  Physical deconditioning Patient seen by physical therapy and Occupational Therapy.  He was noted to be profoundly deconditioned.  Skilled nursing facility recommended.  Initially thought to be a candidate for inpatient rehabilitation but does not meet criteria.  Patient initially refused to go to skilled nursing facility but now is more amenable.  Social worker consulted.   DVT Prophylaxis: Subcutaneous heparin    Code Status: Full code Family Communication: Discussed with the patient Disposition Plan: Patient will need to stay in the hospital as now he is agreeable to go to skilled nursing facility for rehab.  Social worker has been consulted.  Has other family members who can take care of his elderly father at home.    LOS: 6 days   [place] [facility] Pager [phone] 03/02/2018, 12:04 PM  If 7PM-7AM, please contact night-coverage at [email or web address], password  [facility]

## 2018-03-02 NOTE — Progress Notes (Signed)
Patient refused CPAP for the night  

## 2018-03-02 NOTE — Progress Notes (Signed)
Subjective: Interval History: has no complaint.  Objective: Vital signs in last 24 hours: Temp:  [98.4 F (36.9 C)-98.8 F (37.1 C)] 98.4 F (36.9 C) (12/28 0744) Pulse Rate:  [63-82] 63 (12/28 0744) Resp:  [18] 18 (12/27 2237) BP: (129-140)/(62-89) 135/62 (12/28 0744) SpO2:  [91 %-100 %] 93 % (12/28 0744) Weight:  [120.4 kg] 120.4 kg (12/28 0600) Weight change: -5 kg  Intake/Output from previous day: 12/27 0701 - 12/28 0700 In: 254.1 [P.O.:240; IV Piggyback:14.1] Out: -  Intake/Output this shift: No intake/output data recorded.  General appearance: cooperative, morbidly obese, pale and slowed mentation Resp: diminished breath sounds bilaterally and rales bibasilar Chest wall: RIJ PC, L [place] pacer Cardio: S1, S2 normal and systolic murmur: holosystolic 2/6, blowing at lower left sternal border GI: obese, pos bs, liver down 7 cm Extremities: edema 1-2+ and AVF RUA  Lab Results: Recent Labs    02/28/18 0306 03/02/18 0242  WBC 7.9 9.3  HGB 7.5* 7.2*  HCT 24.4* 24.3*  PLT 123* 140*   BMET:  Recent Labs    03/01/18 0229 03/02/18 0242  NA 139 138  K 4.4 4.5  CL 103 103  CO2 26 24  GLUCOSE 100* 115*  BUN 61* 69*  CREATININE 5.05* 5.41*  CALCIUM 9.0 8.9   No results for input(s): PTH in the last 72 hours. Iron Studies: No results for input(s): IRON, TIBC, TRANSFERRIN, FERRITIN in the last 72 hours.  Studies/Results: No results found.  I have reviewed the patient's current medications.  Assessment/Plan: 1 ESRD for 3r d HD.  Vol xs .   2 Anemia esa/Fe 3 DM controlled 4 HTPH vit D 5 Obesity 6 OSA 7 CAD 8 Arrhythmias 9 Neuropathy P HD, slow,  Lower vol , cont esa, check PTH, give vit D, control DM    LOS: 6 days   Jeneen Rinks Amir Fick 03/02/2018,7:49 AM

## 2018-03-02 NOTE — Procedures (Signed)
I was present at this session.  I have reviewed the session itself and made appropriate changes.  HD via PC, 3rd HD, low flows.  bp 130s.  Vol xs  Jeneen Rinks Cagney Degrace 12/28/20199:01 AM

## 2018-03-02 NOTE — Progress Notes (Signed)
Pt has had AMS that doesn't last long but is concerning. Pt stated he wanted this RN to get the cord off his hand, there was nothing there. Previous night shift notice same AMS but thought it was side effects of the Ambien given on the night of 03/01/18. Dialysis reported today that pt acted not right at times during treatment today as well.

## 2018-03-02 NOTE — Progress Notes (Signed)
Physical Therapy Treatment Patient Details Name: Matthew [name] MRN: [id number] DOB: 09/06/1965 Today's Date: 03/02/2018    History of Present Illness This 52 y.o. male admitted with somnolence adn falls.   Dx:  acute renal failure superimposed on CKD IV.  Pt started on ESRD 02/11/2018, acute encephalopathy, chronic combined systolic and diastolic HF.  PMH includes:  Bipolar disorder, IDDM, CAD,  OSA, diabetic polyneuropathy, food drop bilaterally,  morbid obesity     PT Comments    Unable to progress with patient today, as he is confused at times. He states he feels groggy and off since taking Ambien last night, believing im handing him things when I'm not and prematurely sitting during gait citing his sock is caught on walker when it it isn't. RN made aware of subtle changes in cognition. Pt presesnts as high fall risk and unsafe to return home at this time, cont to rec post acute rehab.     Follow Up Recommendations  CIR     Equipment Recommendations  None recommended by PT    Recommendations for Other Services       Precautions / Restrictions Precautions Precautions: Fall Restrictions Weight Bearing Restrictions: Yes    Mobility  Bed Mobility Overal bed mobility: Modified Independent                Transfers Overall transfer level: Needs assistance Equipment used: Rolling walker (2 wheeled) Transfers: Sit to/from [facility] Sit to Stand: Min guard Stand pivot transfers: Min guard       General transfer comment: close min guard as he fatigues   Ambulation/Gait Ambulation/Gait assistance: Min guard;Min assist Gait Distance (Feet): 10 Feet Assistive device: Rolling walker (2 wheeled) Gait Pattern/deviations: Step-through pattern;Decreased stride length;Trunk flexed Gait velocity: decreased   General Gait Details: Min A at times due to patients balance and altered mental status attempting to sit without warning, stating his sock was stuck  when it was not. close chair follow for safety.    Stairs             Wheelchair Mobility    Modified Rankin (Stroke Patients Only)       Balance Overall balance assessment: Needs assistance Sitting-balance support: Feet supported Sitting balance-Leahy Scale: Good     Standing balance support: Bilateral upper extremity supported;During functional activity;Single extremity supported Standing balance-Leahy Scale: Poor Standing balance comment: requires UE support                             Cognition Arousal/Alertness: Awake/alert Behavior During Therapy: WFL for tasks assessed/performed Overall Cognitive Status: Impaired/Different from baseline Area of Impairment: Attention;Safety/judgement;Awareness;Problem solving                   Current Attention Level: Alternating     Safety/Judgement: Decreased awareness of deficits;Decreased awareness of safety Awareness: Intellectual Problem Solving: Requires verbal cues General Comments: patient with concerning confusion today. kept trying to accept objects i was handing him when i was not doing so, telling me his sock is stuck in the walker when it was not touching the walker.       Exercises Other Exercises Other Exercises: Serial sit to stands x 5 Other Exercises: Seated LAQ's x 10    General Comments        Pertinent Vitals/Pain Pain Assessment: Faces Faces Pain Scale: Hurts a little bit Pain Location: low back Pain Descriptors / Indicators: Spasm Pain Intervention(s): Monitored during session  Home Living                      Prior Function            PT Goals (current goals can now be found in the care plan section) Acute Rehab PT Goals Patient Stated Goal: to go home and take care of father  PT Goal Formulation: With patient Time For Goal Achievement: 03/14/18 Potential to Achieve Goals: Good Progress towards PT goals: Progressing toward goals    Frequency     Min 3X/week      PT Plan Current plan remains appropriate    Co-evaluation              AM-PAC PT "6 Clicks" Mobility   Outcome Measure  Help needed turning from your back to your side while in a flat bed without using bedrails?: None Help needed moving from lying on your back to sitting on the side of a flat bed without using bedrails?: A Little Help needed moving to and from a bed to a chair (including a wheelchair)?: A Little Help needed standing up from a chair using your arms (e.g., wheelchair or bedside chair)?: A Little Help needed to walk in hospital room?: A Little Help needed climbing 3-5 steps with a railing? : A Lot 6 Click Score: 18    End of Session Equipment Utilized During Treatment: Gait belt Activity Tolerance: Patient tolerated treatment well Patient left: in chair;with call bell/phone within reach;with chair alarm set Nurse Communication: Mobility status PT Visit Diagnosis: Unsteadiness on feet (R26.81);Difficulty in walking, not elsewhere classified (R26.2)     Time: 6979-4801 PT Time Calculation (min) (ACUTE ONLY): 24 min  Charges:  $Gait Training: 8-22 mins $Therapeutic Activity: 8-22 mins                    Reinaldo Berber, PT, DPT Acute Rehabilitation Services Pager: [phone] Office: [phone]     Reinaldo Berber 03/02/2018, 4:38 PM

## 2018-03-03 LAB — GLUCOSE, CAPILLARY
Glucose-Capillary: 149 mg/dL — ABNORMAL HIGH (ref 70–99)
Glucose-Capillary: 125 mg/dL — ABNORMAL HIGH (ref 70–99)
Glucose-Capillary: 111 mg/dL — ABNORMAL HIGH (ref 70–99)
Glucose-Capillary: 113 mg/dL — ABNORMAL HIGH (ref 70–99)

## 2018-03-03 LAB — RENAL FUNCTION PANEL
Albumin: 2.7 g/dL — ABNORMAL LOW (ref 3.5–5.0)
Anion gap: 11 (ref 5–15)
BUN: 52 mg/dL — ABNORMAL HIGH (ref 6–20)
CO2: 26 mmol/L (ref 22–32)
Calcium: 9.1 mg/dL (ref 8.9–10.3)
Chloride: 100 mmol/L (ref 98–111)
Creatinine, Ser: 4.77 mg/dL — ABNORMAL HIGH (ref 0.61–1.24)
GFR calc Af Amer: 15 mL/min — ABNORMAL LOW (ref >60)
GFR calc non Af Amer: 13 mL/min — ABNORMAL LOW (ref >60)
GLUCOSE: 154 mg/dL — AB (ref 70–99)
Phosphorus: 5.6 mg/dL — ABNORMAL HIGH (ref 2.5–4.6)
Potassium: 4.2 mmol/L (ref 3.5–5.1)
Sodium: 137 mmol/L (ref 135–145)

## 2018-03-03 LAB — CBC
HCT: 25.8 % — ABNORMAL LOW (ref 39.0–52.0)
Hemoglobin: 7.8 g/dL — ABNORMAL LOW (ref 13.0–17.0)
MCH: 25.9 pg — AB (ref 26.0–34.0)
MCHC: 30.2 g/dL (ref 30.0–36.0)
MCV: 85.7 fL (ref 80.0–100.0)
Platelets: 163 10*3/uL (ref 150–400)
RBC: 3.01 MIL/uL — AB (ref 4.22–5.81)
RDW: 15.1 % (ref 11.5–15.5)
WBC: 10.8 10*3/uL — ABNORMAL HIGH (ref 4.0–10.5)
nRBC: 0 % (ref 0.0–0.2)

## 2018-03-03 MED ORDER — HALOPERIDOL LACTATE 5 MG/ML IJ SOLN
2.0000 mg | Freq: Once | INTRAMUSCULAR | Status: AC
  Administered 2018-03-03: 2 mg via INTRAVENOUS
  Filled 2018-03-03: qty 1

## 2018-03-03 MED ORDER — MIRTAZAPINE 15 MG PO TABS
15.0000 mg | ORAL_TABLET | Freq: Every day | ORAL | Status: DC
  Administered 2018-03-03: 15 mg via ORAL
  Filled 2018-03-03: qty 1

## 2018-03-03 MED ORDER — HALOPERIDOL LACTATE 5 MG/ML IJ SOLN
2.0000 mg | Freq: Four times a day (QID) | INTRAMUSCULAR | Status: DC | PRN
  Administered 2018-03-04: 2 mg via INTRAVENOUS
  Filled 2018-03-03: qty 1

## 2018-03-03 NOTE — Progress Notes (Signed)
Pt with ongoing confusion throughout the night.  Is very restless.  Visual hallucinations present at times.  Pt is able to correctly answer orientation questions but is also speaks insensibly.  He feels that this may be related to "that stuff they gave me the other night and the first days of dialysis."  He identifies that he is at [facility], the year, time, and president but is also aware that he "can't figure things out."   Grips are equal, smile symmetrical, PERRLA.  VSS.  Pt denies pain. HR 88  BP 134/83  Sats 94% on room air

## 2018-03-03 NOTE — NC FL2 (Addendum)
[place] LEVEL OF CARE SCREENING TOOL     IDENTIFICATION  Patient Name: Matthew [name] Birthdate: 02-08-1966 Sex: male Admission Date (Current Location): 03/01/2018  [facility] and [place] Number:  [employer] and Address:  [facility] [place] [place] [address] [place], [place] [address]      Provider Number: [id number]  Attending Physician Name and Address:  Bonnielee Haff, MD  Relative Name and Phone Number:       Current Level of Care: Hospital Recommended Level of Care: [place] Prior Approval Number:    Date Approved/Denied:   PASRR Number: [id number]   Discharge Plan: SNF    Current Diagnoses: Patient Active Problem List   Diagnosis Date Noted  . Acute encephalopathy 02/23/2018  . Anemia of chronic disease 02/23/2018  . Chronic combined systolic and diastolic CHF (congestive heart failure) ([place]) 02/23/2018  . Pressure injury of skin 02/23/2018  . Skin infection, bacterial 04/14/2017  . Periodic limb movement 04/02/2017  . Nocturnal leg cramps 04/02/2017  . Dyslipidemia associated with type 2 diabetes mellitus ([place]) 09/26/2016  . Hypertension associated with stage 3 chronic kidney disease due to type 2 diabetes mellitus ([place]) 09/26/2016  . Uncontrolled type 2 diabetes mellitus with severe nonproliferative retinopathy and macular edema, without long-term current use of insulin ([place]) 09/26/2016  . Uncontrolled type 2 diabetes mellitus with stage 4 chronic kidney disease, with long-term current use of insulin ([place]) 09/26/2016  . Combined forms of age-related cataract of left eye 04/27/2016  . Combined forms of age-related cataract of right eye 04/13/2016  . RLS (restless legs syndrome) 04/22/2015  . Tubular adenoma of colon 01/07/2015  . Left carpal tunnel syndrome 09/30/2013  . ED (erectile dysfunction) 09/30/2013  . Panic disorder with agoraphobia 07/29/2013  . Allergic rhinitis 05/29/2013  . Back pain with  radiculopathy 05/13/2013  . DDD (degenerative disc disease) 05/13/2013  . Abnormality of gait 04/08/2013  . Diabetic polyneuropathy ([place]) 04/08/2013  . Foot drop, bilateral 04/08/2013  . Hip pain, right 03/23/2013  . Disequilibrium 03/23/2013  . Poor vision 11/28/2012  . OSA (obstructive sleep apnea) 02/02/2012  . Carotid bruit 11/01/2011  . Numbness and tingling in hands 11/01/2011  . Insomnia 07/28/2011  . Anxiety 07/23/2011  . Stress 07/23/2011  . Congestive heart failure ([place])   . Automatic implantable cardioverter-defibrillator in situ 03/10/2010  . HYPERTENSION, BENIGN 12/28/2009  . VENTRICULAR TACHYCARDIA 12/28/2009  . Coronary atherosclerosis of native coronary artery 09/20/2009  . FATIGUE 09/14/2009  . Diabetes mellitus, insulin dependent (IDDM), uncontrolled ([place]) 05/04/2008  . Depression with anxiety 05/04/2008  . Hyperlipidemia 07/10/2007  . Morbid obesity ([place]) 07/10/2007  . NICOTINE ADDICTION 07/10/2007    Orientation RESPIRATION BLADDER Height & Weight     Self, Time, Situation, Place  Normal Continent Weight: 258 lb 13.1 oz (117.4 kg) Height:  5\' 9"  (175.3 cm)  BEHAVIORAL SYMPTOMS/MOOD NEUROLOGICAL BOWEL NUTRITION STATUS      Continent Diet(renal, fluid restriction to 1200 mL)  AMBULATORY STATUS COMMUNICATION OF NEEDS Skin   Limited Assist Verbally PU Stage and Appropriate Care, Surgical wounds(closed wounds, right neck and right arm; no dressing)   PU Stage 2 Dressing: (sacrum, foam dressing)                   Personal Care Assistance Level of Assistance  Bathing, Feeding, Dressing Bathing Assistance: Limited assistance Feeding assistance: Independent Dressing Assistance: Limited assistance     Functional Limitations Info  Sight, Hearing, Speech Sight Info:  Adequate Hearing Info: Adequate Speech Info: Adequate    SPECIAL CARE FACTORS FREQUENCY  PT (By licensed PT), OT (By licensed OT)     PT Frequency: 5x/wk OT Frequency: 5x/wk             Contractures Contractures Info: Not present    Additional Factors Info  Code Status, Allergies, Psychotropic, Insulin Sliding Scale Code Status Info: Full Allergies Info: Ace Inhibitors, Cephalexin, Beta Adrenergic Blockers Psychotropic Info: Buspar 10mg  2x/day; Prozac 20mg  daily; Remeron 15mg  daily at bed Insulin Sliding Scale Info: 0-9 units 3x/day with meals; 0-5 units daily at bed; Lantus 10 units daily       Current Medications (03/03/2018):  This is the current hospital active medication list Current Facility-Administered Medications  Medication Dose Route Frequency Provider Last Rate Last Dose  . acetaminophen (TYLENOL) tablet 650 mg  650 mg Oral Q6H PRN Dagoberto Ligas, PA-C   650 mg at 02/23/18 3267   Or  . acetaminophen (TYLENOL) suppository 650 mg  650 mg Rectal Q6H PRN Dagoberto Ligas, PA-C      . aspirin EC tablet 81 mg  81 mg Oral Daily Dagoberto Ligas, PA-C   81 mg at 03/03/18 0858  . busPIRone (BUSPAR) tablet 10 mg  10 mg Oral BID Dagoberto Ligas, PA-C   10 mg at 03/03/18 0857  . calcitRIOL (ROCALTROL) capsule 1 mcg  1 mcg Oral Nat Math, MD   1 mcg at 03/03/18 0858  . calcium acetate (PHOSLO) capsule 1,334 mg  1,334 mg Oral TID WC Bonnielee Haff, MD   1,334 mg at 03/03/18 1229  . Chlorhexidine Gluconate Cloth 2 % PADS 6 each  6 each Topical Q0600 Edrick Oh, MD   6 each at 03/03/18 0601  . Chlorhexidine Gluconate Cloth 2 % PADS 6 each  6 each Topical T2458 Mauricia Area, MD   6 each at 03/03/18 0600  . Darbepoetin Alfa (ARANESP) injection 200 mcg  200 mcg Intravenous Q Sat-HD Deterding, Jeneen Rinks, MD      . FLUoxetine (PROZAC) capsule 20 mg  20 mg Oral Daily Dagoberto Ligas, PA-C   20 mg at 03/03/18 0857  . heparin injection 5,000 Units  5,000 Units Subcutaneous Q8H Dagoberto Ligas, PA-C   5,000 Units at 03/03/18 0998  . HYDROcodone-acetaminophen (NORCO/VICODIN) 5-325 MG per tablet 1-2 tablet  1-2 tablet Oral Q4H PRN Dagoberto Ligas, PA-C      .  insulin aspart (novoLOG) injection 0-5 Units  0-5 Units Subcutaneous QHS Dagoberto Ligas, PA-C   2 Units at 02/23/18 2250  . insulin aspart (novoLOG) injection 0-9 Units  0-9 Units Subcutaneous TID WC Dagoberto Ligas, PA-C   1 Units at 03/03/18 1228  . insulin glargine (LANTUS) injection 10 Units  10 Units Subcutaneous Daily Bonnielee Haff, MD   10 Units at 03/03/18 0857  . metoprolol succinate (TOPROL-XL) 24 hr tablet 12.5 mg  12.5 mg Oral Nile Riggs, MD   12.5 mg at 03/02/18 2237  . mirtazapine (REMERON) tablet 15 mg  15 mg Oral QHS Bonnielee Haff, MD      . multivitamin (RENA-VIT) tablet 1 tablet  1 tablet Oral Nile Riggs, MD   1 tablet at 03/02/18 2236  . ondansetron (ZOFRAN) tablet 4 mg  4 mg Oral Q6H PRN Dagoberto Ligas, PA-C       Or  . ondansetron (ZOFRAN) injection 4 mg  4 mg Intravenous Q6H PRN Dagoberto Ligas, PA-C      . rosuvastatin (CRESTOR) tablet 10 mg  10  mg Oral q1800 Dagoberto Ligas, PA-C   10 mg at 03/02/18 1727  . sodium chloride (OCEAN) 0.65 % nasal spray 1 spray  1 spray Each Nare PRN Dagoberto Ligas, PA-C   1 spray at 02/06/2018 2248  . tamsulosin (FLOMAX) capsule 0.4 mg  0.4 mg Oral Daily Dagoberto Ligas, PA-C   0.4 mg at 03/03/18 2419     Discharge Medications: Please see discharge summary for a list of discharge medications.  Relevant Imaging Results:  Relevant Lab Results:   Additional Information SS#: [id number]   Will get dialysis TTS spot at [facility],

## 2018-03-03 NOTE — Progress Notes (Signed)
Pt had increased hallucinations, paranoia, and agitation. His BP was 150/120 when reassessed it was 170s/90s. Pt's hand tremors became increasingly more visibly. When it comes to hallucinations, pt began to believe family members were in room with him (he began having verbal fights with hallucinations). Pt stated he though staff in hallway were talking about him - RN clarified that they were not. Pt refused his dinner and ice water with meds. Pt stated he would not take any night shift PO medications. At times, when RN scanned pt's badge he stated he thought employee was trying to tazer him.   RN reported findings to [place] MD. One time haldol dose ordered along with PRN haldol order placed. RN administered one time dose with security on floor in case pt became combative.

## 2018-03-03 NOTE — Progress Notes (Signed)
[facility] Progress Note    Assessment/ Plan:   1 ESRD  3r d HD 12/28.  - Still vol overloaded but will eventually get down to EDW. No acute indication for dialysis today. - Next planned HD Tuesday. - Has outpt TTS spot at [facility]. 2 Anemia esa/Fe 3 DM controlled 4 HTPH vit D 5 Obesity 6 OSA 7 CAD s/p CABG 8 Arrhythmias 9 Neuropathy 10 Deconditioning with h/o right leg trauma and hip pain -> appears to need SNF and SW working on.   Subjective:   Lower back pain, sciatica. Denies dyspnea, fever/c/n/v.  Confused but pleasant; thinks he sees tiny red fibers.   Objective:   BP 133/84 (BP Location: Left Arm)   Pulse 84   Temp 98.1 F (36.7 C) (Oral)   Resp 14   Ht 5\' 9"  (1.753 m)   Wt 117.4 kg   SpO2 91%   BMI 38.22 kg/m   Intake/Output Summary (Last 24 hours) at 03/03/2018 1357 Last data filed at 03/03/2018 1135 Gross per 24 hour  Intake -  Output 1025 ml  Net -1025 ml   Weight change: -3 kg  Physical Exam: General appearance: cooperative, morbidly obese, pale, pleasant Resp: diminished breath sounds bilaterally and rales bibasilar Chest wall: RIJ PC, L  pacer Cardio: S1, S2 normal and systolic murmur: holosystolic 2/6, blowing at lower left sternal border GI: obese, pos bs Extremities: edema 1-2+  Access: RIJ TC + rt BCF w/ bruit   Imaging: No results found.  Labs: BMET Recent Labs  Lab 02/25/18 0250 02/13/2018 0251 02/27/18 0322 02/28/18 0306 03/01/18 0229 03/02/18 0242 03/03/18 0215  NA 137 139 141 138 139 138 137  K 3.6 3.9 4.0 4.1 4.4 4.5 4.2  CL 105 104 108 106 103 103 100  CO2 21* 19* 20* 22 26 24 26   GLUCOSE 134* 89 143* 135* 100* 115* 154*  BUN 98* 99* 73* 81* 61* 69* 52*  CREATININE 7.63* 7.63* 5.60* 6.21* 5.05* 5.41* 4.77*  CALCIUM 8.4* 8.8* 8.1* 8.7* 9.0 8.9 9.1  PHOS 8.7* 8.4* 7.3* 7.3* 6.6* 6.4* 5.6*   CBC Recent Labs  Lab 02/25/18 0250 02/25/2018 0251 02/28/18 0306 03/02/18 0242 03/03/18 0215  WBC 8.9  8.2 7.9 9.3 10.8*  NEUTROABS 7.1  --   --   --   --   HGB 7.9* 8.4* 7.5* 7.2* 7.8*  HCT 25.9* 27.5* 24.4* 24.3* 25.8*  MCV 84.9 84.6 85.9 87.4 85.7  PLT 125* 138* 123* 140* 163    Medications:    . aspirin EC  81 mg Oral Daily  . busPIRone  10 mg Oral BID  . calcitRIOL  1 mcg Oral QODAY  . calcium acetate  1,334 mg Oral TID WC  . Chlorhexidine Gluconate Cloth  6 each Topical Q0600  . Chlorhexidine Gluconate Cloth  6 each Topical Q0600  . darbepoetin (ARANESP) injection - DIALYSIS  200 mcg Intravenous Q Sat-HD  . FLUoxetine  20 mg Oral Daily  . heparin  5,000 Units Subcutaneous Q8H  . insulin aspart  0-5 Units Subcutaneous QHS  . insulin aspart  0-9 Units Subcutaneous TID WC  . insulin glargine  10 Units Subcutaneous Daily  . metoprolol succinate  12.5 mg Oral QHS  . mirtazapine  15 mg Oral QHS  . multivitamin  1 tablet Oral QHS  . rosuvastatin  10 mg Oral q1800  . tamsulosin  0.4 mg Oral Daily      Otelia Santee, MD 03/03/2018, 1:57 PM

## 2018-03-03 NOTE — Plan of Care (Signed)
Pt with potential social barriers to managing health-related needs.   Up to chair this evening, tolerated well.

## 2018-03-03 NOTE — Progress Notes (Signed)
[facility] PROGRESS NOTE  Matthew [name] ZLD:[id number] DOB: 17-Jul-1965 DOA: 02/28/2018  PCP: [name], MD  Brief History/Interval Summary: 52-year-old male with a past medical history of coronary artery disease status post bypass, history of cardiac arrest status post pacemaker/AICD, chronic kidney disease stage IV, diabetes mellitus type 2, hyperlipidemia, history of obstructive sleep apnea recently diagnosed hospitalized in April for hand cellulitis presented with somnolence falls patient was noted to have worsening renal function with a creatinine up to 7.4 from his baseline of 3.1.  He was also found to have urinary retention.  He was hospitalized for further management.    Reason for Visit: End-stage renal disease  Consultants: Nephrology  Procedures:  Dialysis catheter placement.  AV fistula creation.    Hemodialysis  Antibiotics: None  Subjective/Interval History: According to nursing reports patient noted to have period's of confusion overnight.  Discussed with patient.  He attributes this to lack of sleep.  States that he takes Remeron at home which he is not getting here.  Did not tolerate Ambien.  Denies any complaints this morning.     ROS: Denies any headaches  Objective:  Vital Signs  Vitals:   03/02/18 1129 03/02/18 1641 03/02/18 2232 03/03/18 0752  BP: (!) 120/59 105/71 (!) 144/78 133/84  Pulse: 85 97 94 84  Resp: 14     Temp: 98.3 F (36.8 C) 98.5 F (36.9 C) 99.5 F (37.5 C) 98.1 F (36.7 C)  TempSrc: Oral Oral Oral Oral  SpO2: 96% 94% 94% 91%  Weight: 117.4 kg     Height:        Intake/Output Summary (Last 24 hours) at 03/03/2018 1223 Last data filed at 03/03/2018 1135 Gross per 24 hour  Intake -  Output 1575 ml  Net -1575 ml   Filed Weights   03/01/18 0500 03/02/18 0600 03/02/18 1129  Weight: 121.2 kg 120.4 kg 117.4 kg    General appearance: Awake alert.  In no distress Resp: Clear to auscultation bilaterally.  No  wheezing rales or rhonchi Cardio: S1-S2 is normal regular.  No S3-S4.  No rubs murmurs or bruit GI: Abdomen is soft.  Nontender nondistended. Neurologic: Alert and oriented x3.  Cranial nerves II to XII intact.  Motor strength equal bilateral upper and lower extremities.  He does have generalized deconditioning.  Lab Results:  Data Reviewed: I have personally reviewed following labs and imaging studies  CBC: Recent Labs  Lab 02/25/18 0250 02/15/2018 0251 02/28/18 0306 03/02/18 0242 03/03/18 0215  WBC 8.9 8.2 7.9 9.3 10.8*  NEUTROABS 7.1  --   --   --   --   HGB 7.9* 8.4* 7.5* 7.2* 7.8*  HCT 25.9* 27.5* 24.4* 24.3* 25.8*  MCV 84.9 84.6 85.9 87.4 85.7  PLT 125* 138* 123* 140* 903    Basic Metabolic Panel: Recent Labs  Lab 02/27/18 0322 02/28/18 0306 03/01/18 0229 03/02/18 0242 03/03/18 0215  NA 141 138 139 138 137  K 4.0 4.1 4.4 4.5 4.2  CL 108 106 103 103 100  CO2 20* 22 26 24 26   GLUCOSE 143* 135* 100* 115* 154*  BUN 73* 81* 61* 69* 52*  CREATININE 5.60* 6.21* 5.05* 5.41* 4.77*  CALCIUM 8.1* 8.7* 9.0 8.9 9.1  PHOS 7.3* 7.3* 6.6* 6.4* 5.6*    GFR: Estimated Creatinine Clearance: 22.4 mL/min (A) (by C-G formula based on SCr of 4.77 mg/dL (H)).  Liver Function Tests: Recent Labs  Lab 02/27/18 0092 02/28/18 3300 03/01/18 0229 03/02/18 0242 03/03/18 0215  ALBUMIN 2.3* 2.5* 2.4* 2.6* 2.7*     CBG: Recent Labs  Lab 03/02/18 0741 03/02/18 1639 03/02/18 2150 03/03/18 0750 03/03/18 1208  GLUCAP 71 147* 140* 149* 125*     Radiology Studies: No results found.   Medications:  Scheduled: . aspirin EC  81 mg Oral Daily  . busPIRone  10 mg Oral BID  . calcitRIOL  1 mcg Oral QODAY  . calcium acetate  1,334 mg Oral TID WC  . Chlorhexidine Gluconate Cloth  6 each Topical Q0600  . Chlorhexidine Gluconate Cloth  6 each Topical Q0600  . darbepoetin (ARANESP) injection - DIALYSIS  200 mcg Intravenous Q Sat-HD  . FLUoxetine  20 mg Oral Daily  . heparin   5,000 Units Subcutaneous Q8H  . insulin aspart  0-5 Units Subcutaneous QHS  . insulin aspart  0-9 Units Subcutaneous TID WC  . insulin glargine  10 Units Subcutaneous Daily  . metoprolol succinate  12.5 mg Oral QHS  . mirtazapine  15 mg Oral QHS  . multivitamin  1 tablet Oral QHS  . rosuvastatin  10 mg Oral q1800  . tamsulosin  0.4 mg Oral Daily   Continuous:  QAS:[id number] **OR** acetaminophen, HYDROcodone-acetaminophen, ondansetron **OR** ondansetron (ZOFRAN) IV, sodium chloride    Assessment/Plan:  Acute renal failure superimposed on chronic kidney disease stage IV Initially thought to be due to urinary retention but there was no improvement despite Foley catheter placement.  Dialysis recommended by nephrology.  Patient initially refused but then after further discussions he was agreeable.  Vascular surgery consulted.  Tunneled access placed on 12/24.  AV fistula created in the right arm.  Patient being dialyzed per nephrology.  He has been found a spot in an outpatient dialysis center but financial issues not resolved completely.  So he will need to stay in the hospital for now.     Urinary retention This is resolved.  Patient found to have urinary retention at the time of admission.  Foley catheter was placed.  He has had good urine output.  Foley catheter was discontinued.  He has been able to void on his own.  Continue tamsulosin.   Acute metabolic encephalopathy Initially thought to be due to uremia.  Now resolved.  Episodes of confusion noted overnight which could be due to insomnia due to the fact that he received Ambien 2 nights ago.  Could also be due to to noncompliance with CPAP.  Patient mental status appears to be at baseline this morning.  Reinitiate his Remeron.  Will discuss with nephrology if okay to resume baclofen.  Chronic combined systolic and diastolic CHF/AICD EF noted to be 45 to 50% based on echo done in May 2019.  Previously it has been lower.  Patient is  status post AICD.  Volume to be now managed with hemodialysis.  To be bradycardic.  Dose of metoprolol was decreased.  Now stable.  Continue to monitor.   Insulin-dependent diabetes mellitus HbA1c 9.3 in April.  Managed at home with [place].  On a lower dose of Lantus here in the hospital.  Dose of Lantus was further reduced due to borderline low CBGs.  Improved this morning.   Anemia of chronic kidney disease Hemoglobin is low but stable.  No evidence of overt bleeding.  History of depression and anxiety Continue home medications.  History of coronary artery disease status post CABG Stable.  On aspirin.  On beta-blocker.  Continue to monitor heart rate.  Morbid obesity BMI 40  Obstructive sleep apnea  Patient tells that he underwent sleep study recently. Progress note from 10/12 reviewed.  Found to have severe OSA with AHI of 62.1/h.  Patient actually was recommended BiPAP due to suboptimal CPAP titration. His outpatient providers to pursue BiPAP.  Patient will be compliant with his CPAP here in the hospital.  Pressure injury Stage II - Partial thickness loss of dermis presenting as a shallow open ulcer with a red, pink wound bed without slough.Sacrum, posterior, medial.  Wound care.  Physical deconditioning Patient seen by physical therapy and Occupational Therapy.  He was noted to be profoundly deconditioned.  Skilled nursing facility recommended.  Initially thought to be a candidate for inpatient rehabilitation but does not meet criteria.  Patient initially refused to go to skilled nursing facility but now is more amenable.  Social worker consulted.   DVT Prophylaxis: Subcutaneous heparin    Code Status: Full code Family Communication: Discussed with the patient Disposition Plan: Patient will need to stay in the hospital as now he is agreeable to go to skilled nursing facility for rehab.  He also needs to be fully established at outpatient dialysis center.  Social worker has been  consulted.  Has other family members who can take care of his elderly father at home.    LOS: 7 days   [facility] Pager [phone] 03/03/2018, 12:23 PM  If 7PM-7AM, please contact night-coverage at [email or web address], password [facility]

## 2018-03-04 DIAGNOSIS — F418 Other specified anxiety disorders: Secondary | ICD-10-CM

## 2018-03-04 DIAGNOSIS — G9340 Encephalopathy, unspecified: Secondary | ICD-10-CM

## 2018-03-04 LAB — GLUCOSE, CAPILLARY
GLUCOSE-CAPILLARY: 115 mg/dL — AB (ref 70–99)
Glucose-Capillary: 98 mg/dL (ref 70–99)
Glucose-Capillary: 94 mg/dL (ref 70–99)

## 2018-03-04 LAB — RENAL FUNCTION PANEL
Albumin: 2.7 g/dL — ABNORMAL LOW (ref 3.5–5.0)
Anion gap: 14 (ref 5–15)
BUN: 58 mg/dL — ABNORMAL HIGH (ref 6–20)
CHLORIDE: 101 mmol/L (ref 98–111)
CO2: 23 mmol/L (ref 22–32)
Calcium: 8.9 mg/dL (ref 8.9–10.3)
Creatinine, Ser: 5 mg/dL — ABNORMAL HIGH (ref 0.61–1.24)
GFR calc Af Amer: 14 mL/min — ABNORMAL LOW (ref >60)
GFR calc non Af Amer: 12 mL/min — ABNORMAL LOW (ref >60)
Glucose, Bld: 103 mg/dL — ABNORMAL HIGH (ref 70–99)
POTASSIUM: 3.8 mmol/L (ref 3.5–5.1)
Phosphorus: 5.3 mg/dL — ABNORMAL HIGH (ref 2.5–4.6)
Sodium: 138 mmol/L (ref 135–145)

## 2018-03-04 LAB — TSH: TSH: 3.088 u[IU]/mL (ref 0.350–4.500)

## 2018-03-04 LAB — CORTISOL-AM, BLOOD: Cortisol - AM: 24.1 ug/dL — ABNORMAL HIGH (ref 6.7–22.6)

## 2018-03-04 MED ORDER — DARBEPOETIN ALFA 200 MCG/0.4ML IJ SOSY
200.0000 ug | PREFILLED_SYRINGE | Freq: Once | INTRAMUSCULAR | Status: AC
  Administered 2018-03-05: 200 ug via INTRAVENOUS
  Filled 2018-03-04: qty 0.4

## 2018-03-04 MED ORDER — HALOPERIDOL LACTATE 5 MG/ML IJ SOLN
2.0000 mg | Freq: Three times a day (TID) | INTRAMUSCULAR | Status: AC
  Administered 2018-03-04 – 2018-03-05 (×3): 2 mg via INTRAVENOUS
  Filled 2018-03-04 (×3): qty 1

## 2018-03-04 MED ORDER — MIRTAZAPINE 15 MG PO TABS
7.5000 mg | ORAL_TABLET | Freq: Every day | ORAL | Status: DC
  Administered 2018-03-04 – 2018-03-07 (×4): 7.5 mg via ORAL
  Filled 2018-03-04 (×4): qty 1

## 2018-03-04 MED ORDER — LORAZEPAM 2 MG/ML IJ SOLN
1.0000 mg | Freq: Once | INTRAMUSCULAR | Status: AC
  Administered 2018-03-04: 1 mg via INTRAVENOUS
  Filled 2018-03-04: qty 1

## 2018-03-04 MED ORDER — VITAMIN B-1 100 MG PO TABS
100.0000 mg | ORAL_TABLET | Freq: Every day | ORAL | Status: DC
  Administered 2018-03-04 – 2018-03-06 (×2): 100 mg via ORAL
  Filled 2018-03-04 (×2): qty 1

## 2018-03-04 NOTE — Consult Note (Signed)
[facility] Face-to-Face Psychiatry Consult   Reason for Consult:  psychosis Referring Physician:  Unit MD Patient Identification: Matthew [name] MRN:  [id number] Principal Diagnosis: Acute renal failure superimposed on stage 4 chronic kidney disease ([place]) Diagnosis:  Active Problems:   Diabetes mellitus, insulin dependent (IDDM), uncontrolled ([place])   Depression with anxiety   Coronary atherosclerosis of native coronary artery   OSA (obstructive sleep apnea)   Acute encephalopathy   Anemia of chronic disease   Chronic combined systolic and diastolic CHF (congestive heart failure) (HCC)   Pressure injury of skin   Total Time spent with patient: 1 hour  Subjective:   Matthew [name] is a 52 y.o. male patient admitted with recent admission for somnolence and confusion.  HPI:  Matthew [name] is a 52 y.o. male , Chart reviewed, patient seen presented with medical history significant for chronic combined systolic and diastolic CHF with AICD, severe OSA, CAD, chronic kidney disease stage IV, chronic normocytic anemia, hypertension, insulin-dependent diabetes mellitus, and depression with anxiety, now presenting to the emergency department for evaluation of somnolence and falls.  Patient has reported   that he was in his usual state on 02/21/2018, but has been very sleepy on 12/20, feels "out of it," and has fallen multiple times but does not believe that he hit his head or lost consciousness with this.  Patient has history of depression and anxiety, his buspar has kept bid, also on remeron for depression.  Labs and notes reviewed being prepared for dialysis Oriented to month, denies depression but does seem to talk with his face on the side ore repeats stuff, denies hearing voices but does mumble responses which are irrelevant. He said he was very sedate yesterday does acknowledge to be more alert and says it was medication given to him 2 days ago that made him drowsy.  Keeps conversation on direct  questions , if left alone he does drift off as responding to internal stimuli.. Denies otherwise seeing things.  UDS positive with marijuana Denies using klonopine    Past Psychiatric History: Depression, anxiety  Risk to Self:   Risk to Others:   Prior Inpatient Therapy:   Prior Outpatient Therapy:    Past Medical History:  Past Medical History:  Diagnosis Date  . Abnormality of gait 04/08/2013  . AICD (automatic cardioverter/defibrillator) present   . Anxiety    stress  . Anxiety and depression   . CAD (coronary artery disease)    a. OOH MI 6/11; presented with acute CHF; hosp course c/b VF arrest with VDRF, etc;   b s/p CABG: L-LAD/Dx, S-OM/dCFX;  c.Nuclear scan 8/12:  Inferior and inf-lat scar with minimal peri-infarct ischemia, EF 53%.; d.LHC 9/12:  LAD 90%, pD1 (small) 70%, prox large Dx 95%, L-LAD and Dx ok, CFX occluded, S-OM and CFX ok, RCA prox to prox/mid 60-70%.  Medical management     . Cardiac arrest - ventricular fibrillation 08/26/2009  . Carpal tunnel syndrome of right wrist   . Cellulitis 2012   spider bite  . Cellulitis 06/2017   right upper extremity  . Chronic systolic heart failure ([place])   . CKD (chronic kidney disease)   . CKD stage 4 due to type 2 diabetes mellitus ([place]) 04/11/2016  . Coma ([place])   . Diabetic coma with ketoacidosis ([place])    Occured in 1994 where he spent 30 days in a coma . He develpoed infection of his leg and developing pancreatitis during that  time   . Diabetic polyneuropathy ([place])  04/08/2013  . DM2 (diabetes mellitus, type 2) ([place])   . Dyslipidemia associated with type 2 diabetes mellitus ([place]) 09/26/2016  . Dysrhythmia   . Foot drop, bilateral 04/08/2013  . Headache(784.0)    sinus  . History of degenerative disc disease   . HLD (hyperlipidemia)   . HTN (hypertension)   . Hypercholesteremia   . Hypertension associated with stage 3 chronic kidney disease due to type 2 diabetes mellitus ([place]) 09/26/2016  . Ischemic cardiomyopathy     echo 10/11:  inf-septal and apical HK, EF 35%, mod LAE  . Neuropathy   . Nocturnal leg cramps   . Nocturnal leg cramps 04/02/2017  . Obesity   . OSA (obstructive sleep apnea) 02/02/2012  . Pancreatitis 2008  . Periodic limb movement 04/02/2017  . 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 Presence of permanent cardiac pacemaker   . Retinal detachment    Bilateral, laser surgery on the right  . RLS (restless legs syndrome) 04/22/2015  . Thyroid disease   . Type II diabetes mellitus with renal manifestations ([place]) 07/10/2017  . 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 Uncontrolled type 2 diabetes mellitus with diabetic neuropathy, with long-term current use of insulin ([place]) 09/26/2016  . Uncontrolled type 2 diabetes mellitus with severe nonproliferative retinopathy and macular edema, without long-term current use of insulin ([place]) 09/26/2016  . Uncontrolled type 2 diabetes mellitus with stage 4 chronic kidney disease, with long-term current use of insulin ([place]) 09/26/2016    Past Surgical History:  Procedure Laterality Date  . APPENDECTOMY  mid 1990  . AV FISTULA PLACEMENT Right 02/11/2018   Procedure: Creation of Right arm Brachiocephalic Fistula;  Surgeon: Serafina Mitchell, MD;  Location: [facility] OR;  Service: Vascular;  Laterality: Right;  . CARDIAC DEFIBRILLATOR PLACEMENT    . CARDIAC SURGERY    . CARPAL TUNNEL WITH CUBITAL TUNNEL  02/22/2012   Procedure: CARPAL TUNNEL WITH CUBITAL TUNNEL;  Surgeon: Roseanne Kaufman, MD;  Location: [place];  Service: Orthopedics;  Laterality: Right;  Right Carpal Tunnel Release/Right Cubital Tunnel Release and Transposition if Necessary with Flexor Pronator Release  . CATARACT EXTRACTION    . COLONOSCOPY WITH PROPOFOL N/A 11/17/2014   Procedure: COLONOSCOPY WITH PROPOFOL;  Surgeon: Manus Gunning, MD;  Location: [facility];  Service: Gastroenterology;  Laterality: N/A;  . COLONOSCOPY WITH PROPOFOL N/A 01/05/2015   Procedure: COLONOSCOPY WITH PROPOFOL;   Surgeon: Manus Gunning, MD;  Location: [facility];  Service: Gastroenterology;  Laterality: N/A;  . CORONARY ARTERY BYPASS GRAFT    . coronary artery bypass grafting x4  june 29,2011   x 4  . I&D EXTREMITY Right 06/26/2017   Procedure: IRRIGATION AND DEBRIDEMENT RIGHT FOREARM;  Surgeon: Roseanne Kaufman, MD;  Location: [place];  Service: Orthopedics;  Laterality: Right;  . I&D EXTREMITY Right 06/28/2017   Procedure: Repeat irrigation and debridement right forearm;  Surgeon: Roseanne Kaufman, MD;  Location: [place];  Service: Orthopedics;  Laterality: Right;  . I&D EXTREMITY Right 06/30/2017   Procedure: Repeat irrigation and debridement right forearm and reconstruction as necessary;  Surgeon: Roseanne Kaufman, MD;  Location: [place];  Service: Orthopedics;  Laterality: Right;  60 mins  . INSERTION OF DIALYSIS CATHETER N/A 02/20/2018   Procedure: INSERTION TUNNELED  OF Right Internal jugular DIALYSIS CATHETER;  Surgeon: Serafina Mitchell, MD;  Location: [place];  Service: Vascular;  Laterality: N/A;  . PACEMAKER PLACEMENT    . RETINAL DETACHMENT SURGERY Bilateral   . RETINAL DETACHMENT  SURGERY Bilateral    3 months ago  . right sided abdominal cyst removal    . TONSILLECTOMY  1998  . ULNAR NERVE TRANSPOSITION Right    Family History:  Family History  Problem Relation Age of Onset  . Allergies Mother   . Diabetes Mother   . Hypertension Mother   . Hyperlipidemia Mother   . Dementia Mother   . Colon cancer Mother   . Lung cancer Mother   . Kidney cancer Mother   . Colon polyps Mother   . Stroke Father   . Allergies Father   . Dementia Father   . Alcohol abuse Father   . Colon polyps Father   . Diabetes Sister   . Heart disease Sister   . Anxiety disorder Sister   . Cancer Maternal Grandmother        unknown type  . Schizophrenia Paternal Uncle   . Schizophrenia Cousin    Family Psychiatric  History: see chart Social History:  Social History   Substance and Sexual Activity   Alcohol Use No  . Alcohol/week: 0.0 standard drinks   Comment: rarely     Social History   Substance and Sexual Activity  Drug Use No    Social History   Socioeconomic History  . Marital status: Divorced    Spouse name: Not on file  . Number of children: 0  . Years of education: college  . Highest education level: Not on file  Occupational History  . Occupation: Engineer, production: [employer]    Employer: [employer]  . Financial resource strain: Not on file  . Food insecurity:    Worry: Not on file    Inability: Not on file  . Transportation needs:    Medical: Not on file    Non-medical: Not on file  Tobacco Use  . Smoking status: Former Smoker    Types: Cigars  . Smokeless tobacco: Current User    Types: Snuff  . Tobacco comment: Occasional cigar  Substance and Sexual Activity  . Alcohol use: No    Alcohol/week: 0.0 standard drinks    Comment: rarely  . Drug use: No  . Sexual activity: Never  Lifestyle  . Physical activity:    Days per week: Not on file    Minutes per session: Not on file  . Stress: Not on file  Relationships  . Social connections:    Talks on phone: Not on file    Gets together: Not on file    Attends religious service: Not on file    Active member of club or organization: Not on file    Attends meetings of clubs or organizations: Not on file    Relationship status: Not on file  Other Topics Concern  . Not on file  Social History Narrative   Patient drinks about 6 cups of caffeine daily.   Patient is left handed.   Additional Social History:    Allergies:   Allergies  Allergen Reactions  . Ace Inhibitors Cough  . Cephalexin Other (See Comments)    Resistant to antibiotic  . Beta Adrenergic Blockers Cough    Labs:  Results for orders placed or performed during the hospital encounter of 02/07/2018 (from the past 48 hour(s))  Glucose, capillary     Status: Abnormal   Collection Time: 03/02/18  4:39 PM   Result Value Ref Range   Glucose-Capillary 147 (H) 70 - 99 mg/dL  Glucose, capillary  Status: Abnormal   Collection Time: 03/02/18  9:50 PM  Result Value Ref Range   Glucose-Capillary 140 (H) 70 - 99 mg/dL  Renal function panel     Status: Abnormal   Collection Time: 03/03/18  2:15 AM  Result Value Ref Range   Sodium 137 135 - 145 mmol/L   Potassium 4.2 3.5 - 5.1 mmol/L   Chloride 100 98 - 111 mmol/L   CO2 26 22 - 32 mmol/L   Glucose, Bld 154 (H) 70 - 99 mg/dL   BUN 52 (H) 6 - 20 mg/dL   Creatinine, Ser 4.77 (H) 0.61 - 1.24 mg/dL   Calcium 9.1 8.9 - 10.3 mg/dL   Phosphorus 5.6 (H) 2.5 - 4.6 mg/dL   Albumin 2.7 (L) 3.5 - 5.0 g/dL   GFR calc non Af Amer 13 (L) >60 mL/min   GFR calc Af Amer 15 (L) >60 mL/min   Anion gap 11 5 - 15    Comment: Performed at [facility] [address] [place], [place] [address]  CBC     Status: Abnormal   Collection Time: 03/03/18  2:15 AM  Result Value Ref Range   WBC 10.8 (H) 4.0 - 10.5 K/uL   RBC 3.01 (L) 4.22 - 5.81 MIL/uL   Hemoglobin 7.8 (L) 13.0 - 17.0 g/dL   HCT 25.8 (L) 39.0 - 52.0 %   MCV 85.7 80.0 - 100.0 fL   MCH 25.9 (L) 26.0 - 34.0 pg   MCHC 30.2 30.0 - 36.0 g/dL   RDW 15.1 11.5 - 15.5 %   Platelets 163 150 - 400 K/uL   nRBC 0.0 0.0 - 0.2 %    Comment: Performed at [facility], [place] [address], [place], [place] [address]  Glucose, capillary     Status: Abnormal   Collection Time: 03/03/18  7:50 AM  Result Value Ref Range   Glucose-Capillary 149 (H) 70 - 99 mg/dL  Glucose, capillary     Status: Abnormal   Collection Time: 03/03/18 12:08 PM  Result Value Ref Range   Glucose-Capillary 125 (H) 70 - 99 mg/dL  Glucose, capillary     Status: Abnormal   Collection Time: 03/03/18  5:14 PM  Result Value Ref Range   Glucose-Capillary 111 (H) 70 - 99 mg/dL  Glucose, capillary     Status: Abnormal   Collection Time: 03/03/18  9:28 PM  Result Value Ref Range   Glucose-Capillary 113 (H) 70 - 99 mg/dL  Renal function  panel     Status: Abnormal   Collection Time: 03/04/18  2:54 AM  Result Value Ref Range   Sodium 138 135 - 145 mmol/L   Potassium 3.8 3.5 - 5.1 mmol/L   Chloride 101 98 - 111 mmol/L   CO2 23 22 - 32 mmol/L   Glucose, Bld 103 (H) 70 - 99 mg/dL   BUN 58 (H) 6 - 20 mg/dL   Creatinine, Ser 5.00 (H) 0.61 - 1.24 mg/dL   Calcium 8.9 8.9 - 10.3 mg/dL   Phosphorus 5.3 (H) 2.5 - 4.6 mg/dL   Albumin 2.7 (L) 3.5 - 5.0 g/dL   GFR calc non Af Amer 12 (L) >60 mL/min   GFR calc Af Amer 14 (L) >60 mL/min   Anion gap 14 5 - 15    Comment: Performed at [facility], [place] [address], [place], [place] [address]  TSH     Status: None   Collection Time: 03/04/18  2:54 AM  Result Value Ref Range   TSH 3.088 0.350 - 4.500 uIU/mL    Comment: Performed by a 3rd Generation assay with a functional sensitivity of <=0.01 uIU/mL. Performed at [facility], [place] [address] [place], [place] [address]   Cortisol-am, blood     Status: Abnormal   Collection Time: 03/04/18  2:54 AM  Result Value Ref Range   Cortisol - AM 24.1 (H) 6.7 - 22.6 ug/dL    Comment: Performed at [facility] [address], [place], [place] [address]  Glucose, capillary     Status: None   Collection Time: 03/04/18  8:04 AM  Result Value Ref Range   Glucose-Capillary 98 70 - 99 mg/dL    Current Facility-Administered Medications  Medication Dose Route Frequency Provider Last Rate Last Dose  . acetaminophen (TYLENOL) tablet 650 mg  650 mg Oral Q6H PRN Dagoberto Ligas, PA-C   650 mg at 02/23/18 8295   Or  . acetaminophen (TYLENOL) suppository 650 mg  650 mg Rectal Q6H PRN Dagoberto Ligas, PA-C      . aspirin EC tablet 81 mg  81 mg Oral Daily Dagoberto Ligas, PA-C   81 mg at 03/04/18 6213  . busPIRone (BUSPAR) tablet 10 mg  10 mg Oral BID Dagoberto Ligas, PA-C   10 mg at 03/04/18 0865  . calcitRIOL (ROCALTROL) capsule 1 mcg  1 mcg Oral Nat Math, MD   1 mcg at 03/03/18 0858  . calcium acetate (PHOSLO)  capsule 1,334 mg  1,334 mg Oral TID WC Bonnielee Haff, MD   1,334 mg at 03/04/18 0819  . Chlorhexidine Gluconate Cloth 2 % PADS 6 each  6 each Topical Q0600 Edrick Oh, MD   6 each at 03/03/18 0601  . [START ON 03/05/2018] Darbepoetin Alfa (ARANESP) injection 200 mcg  200 mcg Intravenous Once Dwana Melena, MD      . FLUoxetine (PROZAC) capsule 20 mg  20 mg Oral Daily Dagoberto Ligas, PA-C   20 mg at 03/04/18 0820  . haloperidol lactate (HALDOL) injection 2 mg  2 mg Intravenous Q6H PRN Bonnielee Haff, MD   2 mg at 03/04/18 0101  . heparin injection 5,000 Units  5,000 Units Subcutaneous Q8H Dagoberto Ligas, PA-C   5,000 Units at 03/04/18 7846  . HYDROcodone-acetaminophen (NORCO/VICODIN) 5-325 MG per tablet 1-2 tablet  1-2 tablet Oral Q4H PRN Dagoberto Ligas, PA-C      . insulin aspart (novoLOG) injection 0-5 Units  0-5 Units Subcutaneous QHS Dagoberto Ligas, PA-C   2 Units at 02/23/18 2250  . insulin aspart (novoLOG) injection 0-9 Units  0-9 Units Subcutaneous TID WC Dagoberto Ligas, PA-C   1 Units at 03/03/18 1228  . insulin glargine (LANTUS) injection 10 Units  10 Units Subcutaneous Daily Bonnielee Haff, MD   10 Units at 03/04/18 0818  . metoprolol succinate (TOPROL-XL) 24 hr tablet 12.5 mg  12.5 mg Oral Nile Riggs, MD   12.5 mg at 03/03/18 2157  . mirtazapine (REMERON) tablet 15 mg  15 mg Oral QHS Bonnielee Haff, MD   15 mg at 03/03/18 2200  . multivitamin (RENA-VIT) tablet 1 tablet  1 tablet Oral Nile Riggs, MD   1 tablet at 03/03/18 2200  . ondansetron (ZOFRAN) tablet 4 mg  4 mg Oral Q6H PRN Dagoberto Ligas, PA-C       Or  . ondansetron (ZOFRAN) injection 4 mg  4 mg Intravenous Q6H PRN Dagoberto Ligas, PA-C      . rosuvastatin (  CRESTOR) tablet 10 mg  10 mg Oral q1800 Dagoberto Ligas, PA-C   10 mg at 03/03/18 1728  . sodium chloride (OCEAN) 0.65 % nasal spray 1 spray  1 spray Each Nare PRN Dagoberto Ligas, PA-C   1 spray at 02/16/2018 2248  . tamsulosin (FLOMAX)  capsule 0.4 mg  0.4 mg Oral Daily Dagoberto Ligas, PA-C   0.4 mg at 03/04/18 6761    Musculoskeletal: Strength & Muscle Tone: decreased Gait & Station: unable to stand Patient leans: lying in bed  Psychiatric Specialty Exam: Physical Exam  Review of Systems  Cardiovascular: Negative for chest pain.  Psychiatric/Behavioral: Negative for suicidal ideas.    Blood pressure (!) 128/92, pulse 94, temperature 99.4 F (37.4 C), temperature source Oral, resp. rate 19, height 5\' 9"  (1.753 m), weight 112.6 kg, SpO2 91 %.Body mass index is 36.66 kg/m.  General Appearance: Disheveled  Eye Contact:  Fair  Speech:  Slow  Volume:  Normal  Mood:  Euthymic  Affect:  Constricted  Thought Process:  Irrelevant  Orientation:  Other:  month and year  Thought Content:  Hallucinations: Auditory and Rumination  Suicidal Thoughts:  No  Homicidal Thoughts:  No  Memory:  Immediate;   Poor Recent;   Fair  Judgement:  Poor  Insight:  Shallow  Psychomotor Activity:  Decreased  Concentration:  Concentration: Poor and Attention Span: Poor  Recall:  [employer] of Knowledge:  did not assess  Language:  Fair  Akathisia:  NA  Handed:    AIMS (if indicated):     Assets:  Social Support  ADL's:  Impaired  Cognition:  Impaired,  Mild  Sleep:        Treatment Plan Summary: Plan as follows  After review of labs and his chronic kidney, CAD, HTN most possible delerium to be medically related Can continue haldol as already started for agitation, psychosis .  Would suggest to lower remeron to 7.5mg  qhs to avoid sedation  Hold off from Melatonin his home medication or any excessive sedating medication Patient did not elaborate on alcohol use or denies.  Continue to monitor for any possible withdrawals UDS was negative for benzo except for Marijuana   Disposition: Continue treat as Delerium possible medical and related to renal failure  Thanks for Consult .   Merian Capron, MD 03/04/2018 11:30 AM

## 2018-03-04 NOTE — Clinical Social Work Note (Signed)
Clinical Social Work Assessment  Patient Details  Name: Matthew [name] MRN: [id number] Date of Birth: 08/05/1965  Date of referral:  03/04/18               Reason for consult:  Facility Placement                Permission sought to share information with:  Family Supports Permission granted to share information::     Name::     Deanna  Agency::     Relationship::  sister  Contact Information:     Housing/Transportation Living arrangements for the past 2 months:  Single Family Home Source of Information:  Patient Patient Interpreter Needed:  None Criminal Activity/Legal Involvement Pertinent to Current Situation/Hospitalization:  No - Comment as needed Significant Relationships:  Parents, Siblings Lives with:  Parents Do you feel safe going back to the place where you live?  No Need for family participation in patient care:  No (Coment)  Care giving concerns:  Pt is alert and oriented.   Social Worker assessment / plan:  CSW spoke with pt at bedside. No family or friends present. Pt states he lives with family. Pt is agreeable to SNF and does not have a preference. Medicare list provided at bedside. Pt goal documented. Pt to look over and CSW to check in with pt regarding choice tomorrow.   Employment status:  Disabled (Comment on whether or not currently receiving Disability) Insurance information:  Managed Medicare PT Recommendations:  [place] / Referral to community resources:  [place]  Patient/Family's Response to care:  Pt verbalized understanding of CSW role and expressed appreciation for support. Pt denies any concern regarding pt care at this time.   Patient/Family's Understanding of and Emotional Response to Diagnosis, Current Treatment, and Prognosis:  Pt understanding and realistic regarding physical limitations. Pt understands the need for SNF placement at d/c. Pt agreeable to SNF placement at d/c, at this time. Pt's  responses emotionally appropriate during conversation with CSW. Pt denies any concern regarding treatment plan at this time. CSW will continue to provide support and facilitate d/c needs.   Emotional Assessment Appearance:  Appears stated age Attitude/Demeanor/Rapport:  (Patient was appropriate) Affect (typically observed):  Accepting, Appropriate, Calm Orientation:  Oriented to Situation, Oriented to  Time, Oriented to Place, Oriented to Self Alcohol / Substance use:  Not Applicable Psych involvement (Current and /or in the community):  No (Comment)  Discharge Needs  Concerns to be addressed:  Care Coordination, Basic Needs Readmission within the last 30 days:  No Current discharge risk:  Dependent with Mobility Barriers to Discharge:  Continued Medical Work up   [employer], LCSW 03/04/2018, 2:16 PM

## 2018-03-04 NOTE — Progress Notes (Signed)
Pt has refused CPAP for several nights.  Continue to have equipment avail if he changes his mind.

## 2018-03-04 NOTE — Telephone Encounter (Signed)
Called results lmtcb on cell. 

## 2018-03-04 NOTE — Progress Notes (Signed)
Pt has been very restless throughout the night and keeps climbing out of bed. Pt continues to have hallucinations since the beginning of the shift. Pt will answer orientation questions appropriately, but continues to have conversations with people who are not present and seeing objects and things that are not there.   RN obtained order for TeleSitter and pt did not listen to TeleSitter and continued to climb out of bed multiple times. Since pt was not redirectable by TeleSitter, RN had to obtain order for Safety Sitter. Will continue to monitor and assess pt.

## 2018-03-04 NOTE — Progress Notes (Signed)
[facility] PROGRESS NOTE  Matthew [name] VZD:[id number] DOB: Jul 28, 1965 DOA: 02/14/2018  PCP: [name], MD  Brief History/Interval Summary: 52-year-old male with a past medical history of coronary artery disease status post bypass, history of cardiac arrest status post pacemaker/AICD, chronic kidney disease stage IV, diabetes mellitus type 2, hyperlipidemia, history of obstructive sleep apnea recently diagnosed hospitalized in April for hand cellulitis presented with somnolence falls patient was noted to have worsening renal function with a creatinine up to 7.4 from his baseline of 3.1.  He was also found to have urinary retention.  He was hospitalized for further management.  Nephrology was consulted.  Patient was started on dialysis.  Over the last 24 hours patient has had hallucinations.  Reason for this is not entirely clear.  Psychiatry has been consulted.  Reason for Visit: End-stage renal disease  Consultants: Nephrology.  Psychiatry  Procedures:  Dialysis catheter placement.  AV fistula creation.    Hemodialysis  Antibiotics: None  Subjective/Interval History: Overnight events noted.  Patient started having paranoia Delusions yesterday.  He was hallucinating as well.  Reason for this is not entirely clear.  Patient denies any pain.  No headaches.  Denies any shortness of breath.   ROS: Denies any chest pain  Objective:  Vital Signs  Vitals:   03/03/18 2157 03/03/18 2313 03/04/18 0500 03/04/18 0802  BP: (!) 166/79 139/67  (!) 128/92  Pulse: 98 91  94  Resp:  18  19  Temp:  98.5 F (36.9 C)  99.4 F (37.4 C)  TempSrc:  Oral  Oral  SpO2:  98%  91%  Weight:   112.6 kg   Height:        Intake/Output Summary (Last 24 hours) at 03/04/2018 1154 Last data filed at 03/04/2018 0815 Gross per 24 hour  Intake 120 ml  Output 600 ml  Net -480 ml   Filed Weights   03/02/18 0600 03/02/18 1129 03/04/18 0500  Weight: 120.4 kg 117.4 kg 112.6 kg    General appearance: He is awake alert distracted.  In no distress Resp: Clear to auscultation bilaterally.  Normal effort Cardio: S1-S2 is normal regular.  No S3-S4.  No rubs murmurs or bruit GI: Abdomen is soft.  Nontender nondistended.  Bowel sounds are present normal.  No masses organomegaly Extremities: No edema.  Full range of motion of lower extremities. Neurologic: He is alert and oriented x3.  Cranial nerves II to XII intact.  Motor strength equal bilateral upper and lower extremities.  However he would suddenly start talking about spider webs.  Subtle tremors noted.  Lab Results:  Data Reviewed: I have personally reviewed following labs and imaging studies  CBC: Recent Labs  Lab 03/05/2018 0251 02/28/18 0306 03/02/18 0242 03/03/18 0215  WBC 8.2 7.9 9.3 10.8*  HGB 8.4* 7.5* 7.2* 7.8*  HCT 27.5* 24.4* 24.3* 25.8*  MCV 84.6 85.9 87.4 85.7  PLT 138* 123* 140* 295    Basic Metabolic Panel: Recent Labs  Lab 02/28/18 0306 03/01/18 0229 03/02/18 0242 03/03/18 0215 03/04/18 0254  NA 138 139 138 137 138  K 4.1 4.4 4.5 4.2 3.8  CL 106 103 103 100 101  CO2 22 26 24 26 23   GLUCOSE 135* 100* 115* 154* 103*  BUN 81* 61* 69* 52* 58*  CREATININE 6.21* 5.05* 5.41* 4.77* 5.00*  CALCIUM 8.7* 9.0 8.9 9.1 8.9  PHOS 7.3* 6.6* 6.4* 5.6* 5.3*    GFR: Estimated Creatinine Clearance: 20.9 mL/min (A) (by C-G formula based  on SCr of 5 mg/dL (H)).  Liver Function Tests: Recent Labs  Lab 02/28/18 0306 03/01/18 0229 03/02/18 0242 03/03/18 0215 03/04/18 0254  ALBUMIN 2.5* 2.4* 2.6* 2.7* 2.7*     CBG: Recent Labs  Lab 03/03/18 1208 03/03/18 1714 03/03/18 2128 03/04/18 0804 03/04/18 1142  GLUCAP 125* 111* 113* 98 94     Radiology Studies: No results found.   Medications:  Scheduled: . aspirin EC  81 mg Oral Daily  . busPIRone  10 mg Oral BID  . calcitRIOL  1 mcg Oral QODAY  . calcium acetate  1,334 mg Oral TID WC  . Chlorhexidine Gluconate Cloth  6 each  Topical Q0600  . [START ON 03/05/2018] darbepoetin (ARANESP) injection - DIALYSIS  200 mcg Intravenous Once  . FLUoxetine  20 mg Oral Daily  . heparin  5,000 Units Subcutaneous Q8H  . insulin aspart  0-5 Units Subcutaneous QHS  . insulin aspart  0-9 Units Subcutaneous TID WC  . insulin glargine  10 Units Subcutaneous Daily  . metoprolol succinate  12.5 mg Oral QHS  . mirtazapine  15 mg Oral QHS  . multivitamin  1 tablet Oral QHS  . rosuvastatin  10 mg Oral q1800  . tamsulosin  0.4 mg Oral Daily   Continuous:  QVZ:DGLOVFIEPPIRJ **OR** acetaminophen, haloperidol lactate, HYDROcodone-acetaminophen, ondansetron **OR** ondansetron (ZOFRAN) IV, sodium chloride    Assessment/Plan:  Acute renal failure superimposed on chronic kidney disease stage IV Initially thought to be due to urinary retention but there was no improvement despite Foley catheter placement.  Dialysis recommended by nephrology.  Patient initially refused but then after further discussions he was agreeable.  Vascular surgery consulted.  Tunneled access placed on 12/24.  AV fistula created in the right arm.  Patient being dialyzed per nephrology.  He has been found a spot in an outpatient dialysis center but financial issues not resolved completely.  So he will need to stay in the hospital for now.  Patient remains stable from a renal standpoint.  Plan for dialysis tomorrow.  Acute metabolic encephalopathy/acute psychosis Initially thought to be due to uremia.  This initially resolved.  Some of his home medications were held.  Over the last 24 hours he is noted to have hallucinations.  Reason for this is not entirely clear.  Thought to be due to Ambien which was discontinued yesterday.  Continue with Haldol for now.  Psychiatry consult requested.  His baclofen was also discontinued at the time of admission.  Could be related to baclofen withdrawal.  However due to his renal insufficiency and dialysis status cannot use this medication  now.  Treat his symptoms.  Decrease the dose of his Remeron.  Cortisol level normal.  TSH normal.  B12 level was normal when recently checked.  Give thiamine.  Multivitamins.  Does not have any neurological deficits.  CT scan done recently did not show any acute process.  No clear indication to do neuroimaging.  Cannot have MRI due to presence of AICD.  Chronic combined systolic and diastolic CHF/AICD EF noted to be 45 to 50% based on echo done in May 2019.  Previously it has been lower.  Patient is status post AICD.  Volume to be now managed with hemodialysis.  He was noted to be bradycardic.  Dose of metoprolol was decreased.  Now stable.  Continue to monitor.   Insulin-dependent diabetes mellitus HbA1c 9.3 in April.  Managed at home with [place].  On a lower dose of Lantus here in the hospital.  Dose of Lantus was further reduced due to borderline low CBGs.  Continue to monitor closely.  Anemia of chronic kidney disease Hemoglobin is low but stable.  No evidence of overt bleeding.  Urinary retention This is resolved.  Patient found to have urinary retention at the time of admission.  Foley catheter was placed.  He has had good urine output.  Foley catheter was discontinued.  He has been able to void on his own.  Continue tamsulosin.   History of depression and anxiety See above discussion.  History of coronary artery disease status post CABG Stable.  On aspirin.  On beta-blocker.  Continue to monitor heart rate.  Morbid obesity BMI 40  Obstructive sleep apnea Patient tells that he underwent sleep study recently. Progress note from 10/12 reviewed.  Found to have severe OSA with AHI of 62.1/h.  Patient actually was recommended BiPAP due to suboptimal CPAP titration. His outpatient providers to pursue BiPAP.  Patient will be compliant with his CPAP here in the hospital.  Pressure injury Stage II - Partial thickness loss of dermis presenting as a shallow open ulcer with a red, pink wound  bed without slough.Sacrum, posterior, medial.  Wound care.  Physical deconditioning Patient seen by physical therapy and Occupational Therapy.  He was noted to be profoundly deconditioned.  Skilled nursing facility recommended.  Initially thought to be a candidate for inpatient rehabilitation but does not meet criteria.  Patient initially refused to go to skilled nursing facility but now is more amenable.  Social worker consulted.   DVT Prophylaxis: Subcutaneous heparin    Code Status: Full code Family Communication: Discussed with the patient Disposition Plan: Patient continues to require hospitalization as he is not been fully established in the outpatient dialysis center.  Plus he needs placement.  Now with acute delirium.      LOS: 8 days   [facility] Pager [phone] 03/04/2018, 11:54 AM  If 7PM-7AM, please contact night-coverage at [email or web address], password [facility]

## 2018-03-04 NOTE — Care Management Important Message (Signed)
Important Message  Patient Details  Name: Matthew [name] MRN: [id number] Date of Birth: 1966-01-13   Medicare Important Message Given:  Yes    [name] [address] 03/04/2018, 3:54 PM

## 2018-03-04 NOTE — Progress Notes (Signed)
[facility] Progress Note    Assessment/ Plan:   1 ESRD  3r d HD 12/28.  - Still vol overloaded but will eventually get down to EDW. No acute indication for dialysis today. - Next planned HD Tuesday; no acute indication today. Will try for 3-4 liters as tolerated - Has outpt TTS spot at [facility] but not ready for d/c yet.  2 Anemia esa/Fe - already rx 250mg  nulecit #3 out of 4 as ordered; aranesp written for again for tues 3 DM controlled 4 HTPH vit D 5 Obesity 6 OSA 7 CAD s/p CABG 8 Arrhythmias 9 Neuropathy 10 Deconditioning with h/o right leg trauma and hip pain -> appears to need SNF and SW working on. 11 Psychosis - seen by Psych this am.  Subjective:    Denies dyspnea, fever/c/n/v.  Restless last night and climbing OOB with hallucinations, conversations with ppl not seeing and objects. He's denying seeing any more red thread from ceiling but also states that's bec he got a talking to about that this AM.  Currently has a sitter   Objective:   BP (!) 128/92 (BP Location: Left Arm)   Pulse 94   Temp 99.4 F (37.4 C) (Oral)   Resp 19   Ht 5\' 9"  (1.753 m)   Wt 112.6 kg   SpO2 91%   BMI 36.66 kg/m   Intake/Output Summary (Last 24 hours) at 03/04/2018 1048 Last data filed at 03/04/2018 0815 Gross per 24 hour  Intake 120 ml  Output 1150 ml  Net -1030 ml   Weight change: -4.8 kg  Physical Exam: General appearance:cooperative, morbidly obese, pale, pleasant Resp:diminished breath soundsbilaterallyand rales bibasilar Chest wall:RIJ PC, L [place] pacer Cardio:S1, S2 normal and systolic UVOZDG:UYQIHKVQQVZD6/3,OVFIEPPIR lower left sternal border JJ:OACZY, pos bs Extremities:edema1-2+ Access: RIJ TC + rt BCF w/ bruit  Imaging: No results found.  Labs: BMET Recent Labs  Lab 02/19/2018 0251 02/27/18 0322 02/28/18 0306 03/01/18 0229 03/02/18 0242 03/03/18 0215 03/04/18 0254  NA 139 141 138 139 138 137 138  K 3.9 4.0 4.1 4.4 4.5 4.2  3.8  CL 104 108 106 103 103 100 101  CO2 19* 20* 22 26 24 26 23   GLUCOSE 89 143* 135* 100* 115* 154* 103*  BUN 99* 73* 81* 61* 69* 52* 58*  CREATININE 7.63* 5.60* 6.21* 5.05* 5.41* 4.77* 5.00*  CALCIUM 8.8* 8.1* 8.7* 9.0 8.9 9.1 8.9  PHOS 8.4* 7.3* 7.3* 6.6* 6.4* 5.6* 5.3*   CBC Recent Labs  Lab 02/22/2018 0251 02/28/18 0306 03/02/18 0242 03/03/18 0215  WBC 8.2 7.9 9.3 10.8*  HGB 8.4* 7.5* 7.2* 7.8*  HCT 27.5* 24.4* 24.3* 25.8*  MCV 84.6 85.9 87.4 85.7  PLT 138* 123* 140* 163    Medications:    . aspirin EC  81 mg Oral Daily  . busPIRone  10 mg Oral BID  . calcitRIOL  1 mcg Oral QODAY  . calcium acetate  1,334 mg Oral TID WC  . Chlorhexidine Gluconate Cloth  6 each Topical Q0600  . Chlorhexidine Gluconate Cloth  6 each Topical Q0600  . darbepoetin (ARANESP) injection - DIALYSIS  200 mcg Intravenous Q Sat-HD  . FLUoxetine  20 mg Oral Daily  . heparin  5,000 Units Subcutaneous Q8H  . insulin aspart  0-5 Units Subcutaneous QHS  . insulin aspart  0-9 Units Subcutaneous TID WC  . insulin glargine  10 Units Subcutaneous Daily  . metoprolol succinate  12.5 mg Oral QHS  . mirtazapine  15  mg Oral QHS  . multivitamin  1 tablet Oral QHS  . rosuvastatin  10 mg Oral q1800  . tamsulosin  0.4 mg Oral Daily      Otelia Santee, MD 03/04/2018, 10:48 AM

## 2018-03-05 DIAGNOSIS — N186 End stage renal disease: Secondary | ICD-10-CM

## 2018-03-05 LAB — RENAL FUNCTION PANEL
Albumin: 2.6 g/dL — ABNORMAL LOW (ref 3.5–5.0)
Anion gap: 13 (ref 5–15)
BUN: 70 mg/dL — ABNORMAL HIGH (ref 6–20)
CO2: 24 mmol/L (ref 22–32)
Calcium: 9 mg/dL (ref 8.9–10.3)
Chloride: 106 mmol/L (ref 98–111)
Creatinine, Ser: 5.89 mg/dL — ABNORMAL HIGH (ref 0.61–1.24)
GFR calc non Af Amer: 10 mL/min — ABNORMAL LOW (ref >60)
GFR, EST AFRICAN AMERICAN: 12 mL/min — AB (ref >60)
Glucose, Bld: 81 mg/dL (ref 70–99)
Phosphorus: 4.9 mg/dL — ABNORMAL HIGH (ref 2.5–4.6)
Potassium: 4.1 mmol/L (ref 3.5–5.1)
Sodium: 143 mmol/L (ref 135–145)

## 2018-03-05 LAB — GLUCOSE, CAPILLARY
GLUCOSE-CAPILLARY: 73 mg/dL (ref 70–99)
GLUCOSE-CAPILLARY: 112 mg/dL — AB (ref 70–99)
GLUCOSE-CAPILLARY: 87 mg/dL (ref 70–99)
Glucose-Capillary: 62 mg/dL — ABNORMAL LOW (ref 70–99)
Glucose-Capillary: 76 mg/dL (ref 70–99)
Glucose-Capillary: 72 mg/dL (ref 70–99)

## 2018-03-05 LAB — CBC
HCT: 25.2 % — ABNORMAL LOW (ref 39.0–52.0)
Hemoglobin: 7.4 g/dL — ABNORMAL LOW (ref 13.0–17.0)
MCH: 26.2 pg (ref 26.0–34.0)
MCHC: 29.4 g/dL — ABNORMAL LOW (ref 30.0–36.0)
MCV: 89.4 fL (ref 80.0–100.0)
Platelets: 143 10*3/uL — ABNORMAL LOW (ref 150–400)
RBC: 2.82 MIL/uL — ABNORMAL LOW (ref 4.22–5.81)
RDW: 15.9 % — ABNORMAL HIGH (ref 11.5–15.5)
WBC: 9.5 10*3/uL (ref 4.0–10.5)
nRBC: 0 % (ref 0.0–0.2)

## 2018-03-05 MED ORDER — HEPARIN SODIUM (PORCINE) 1000 UNIT/ML IJ SOLN
INTRAMUSCULAR | Status: AC
  Administered 2018-03-05: 3800 [IU]
  Filled 2018-03-05: qty 4

## 2018-03-05 MED ORDER — DEXTROSE 50 % IV SOLN
INTRAVENOUS | Status: AC
  Filled 2018-03-05: qty 50

## 2018-03-05 MED ORDER — NEPRO/CARBSTEADY PO LIQD
237.0000 mL | Freq: Four times a day (QID) | ORAL | Status: DC
  Administered 2018-03-06 – 2018-03-08 (×6): 237 mL via ORAL
  Filled 2018-03-05 (×16): qty 237

## 2018-03-05 MED ORDER — LORAZEPAM 2 MG/ML IJ SOLN
1.0000 mg | Freq: Once | INTRAMUSCULAR | Status: AC
  Administered 2018-03-05: 1 mg via INTRAVENOUS
  Filled 2018-03-05: qty 1

## 2018-03-05 MED ORDER — DARBEPOETIN ALFA 200 MCG/0.4ML IJ SOSY
PREFILLED_SYRINGE | INTRAMUSCULAR | Status: AC
  Filled 2018-03-05: qty 0.4

## 2018-03-05 MED ORDER — DEXTROSE 50 % IV SOLN
0.5000 | Freq: Once | INTRAVENOUS | Status: AC
  Administered 2018-03-05: 25 mL via INTRAVENOUS

## 2018-03-05 NOTE — Progress Notes (Signed)
Pt seems much weaker today, not able to pull himself up in bed or roll over. Pt is very restless and spitting his pills out. Will not eat when fed. Pt unable to feed self now and is incontinent of urine as of the night of 03/04/18.

## 2018-03-05 NOTE — Progress Notes (Addendum)
  Progress Note    03/05/2018 1:38 PM 7 Days Post-Op  Subjective: No right arm complaints at this time.  Vitals:   03/05/18 1100 03/05/18 1112  BP: 114/79 (!) 108/59  Pulse: 100 81  Resp:  19  Temp:  98.2 F (36.8 C)  SpO2:  99%    Physical Exam: Difficult to examine but there is a palpable pulsatility in the cephalic vein in the right upper extremity  CBC    Component Value Date/Time   WBC 9.5 03/05/2018 0719   RBC 2.82 (L) 03/05/2018 0719   HGB 7.4 (L) 03/05/2018 0719   HCT 25.2 (L) 03/05/2018 0719   PLT 143 (L) 03/05/2018 0719   MCV 89.4 03/05/2018 0719   MCH 26.2 03/05/2018 0719   MCHC 29.4 (L) 03/05/2018 0719   RDW 15.9 (H) 03/05/2018 0719   LYMPHSABS 0.8 02/25/2018 0250   MONOABS 0.8 02/25/2018 0250   EOSABS 0.2 02/25/2018 0250   BASOSABS 0.0 02/25/2018 0250    BMET    Component Value Date/Time   NA 143 03/05/2018 0239   K 4.1 03/05/2018 0239   CL 106 03/05/2018 0239   CO2 24 03/05/2018 0239   GLUCOSE 81 03/05/2018 0239   BUN 70 (H) 03/05/2018 0239   CREATININE 5.89 (H) 03/05/2018 0239   CREATININE 3.12 (H) 08/15/2017 1419   CALCIUM 9.0 03/05/2018 0239   GFRNONAA 10 (L) 03/05/2018 0239   GFRNONAA 22 (L) 08/15/2017 1419   GFRAA 12 (L) 03/05/2018 0239   GFRAA 25 (L) 08/15/2017 1419    INR    Component Value Date/Time   INR 1.19 02/08/2018 0251     Intake/Output Summary (Last 24 hours) at 03/05/2018 1338 Last data filed at 03/05/2018 1112 Gross per 24 hour  Intake -  Output 3550 ml  Net -3550 ml     Assessment/Plan: Pt very agitated on hemodialysis.  Difficult to examine due to movement.  The fistula is palpable with faint bruit.  Pt may need fistulogram in the future, but would give the fistula more time to mature.   Leontine Locket, [facility] 03/05/2018 10:05 AM  I have independently evaluated and examined this patient and agree with the above assessment and plan.  Fistula was pulsatile at the completion of the case and still feels  pulsatile today.  I would allow this to mature for a total of 6 weeks he can be evaluated in the office with duplex as previously planned on February 3.  Daymon Hora C. Donzetta Matters, MD [facility] Office: [phone] Pager: [phone]  03/05/2018 1:38 PM

## 2018-03-05 NOTE — Progress Notes (Signed)
PT Cancellation Note  Patient Details Name: Vencent Hauschild MRN: [id number] DOB: 30-Jun-1965   Cancelled Treatment:    Reason Eval/Treat Not Completed: Fatigue/lethargy limiting ability to participate.  Pt is very confused today and unable to follow instructions well.  Try again at another time.   Ramond Dial 03/05/2018, 3:13 PM   Mee Hives, PT MS Acute Rehab Dept. Number: [place] and [place]

## 2018-03-05 NOTE — Significant Event (Signed)
Pt continued to be restless and confused overnight. Pt continued to try and climb out of bed and would not follow commands. Pt was incontinent of urine x2 and continued having hallucinations. One time dosage of Ativan did not seem to help with restlessness.

## 2018-03-05 NOTE — Progress Notes (Signed)
Pt running low grade fever 100.9 and destat to 88% on RA.  This RN Administered Tylenol and 2L [place] O2

## 2018-03-05 NOTE — Progress Notes (Addendum)
[facility] Progress Note   Subjective: Agitated, thrashing about in bed. On HD seems hemodynamically stable.   Objective Vitals:   03/05/18 0745 03/05/18 0800 03/05/18 0830 03/05/18 0845  BP: (!) 102/58 (!) 90/39 (!) 121/57 (!) 103/50  Pulse: 85 89 99 82  Resp:      Temp:      TempSrc:      SpO2:      Weight:      Height:       Physical Exam General:Obese male agitated with sitter at bedside Heart: K9,X8, 2/6 systolic M Lungs: Bilateral breath sounds decreased in bases, otherwise CTAB Abdomen: Obese active BS Extremities: trace L pedal edema, trace BLE edema Dialysis Access: RIJ TDC blood lines connected.    Additional Objective Labs: Basic Metabolic Panel: Recent Labs  Lab 03/03/18 0215 03/04/18 0254 03/05/18 0239  NA 137 138 143  K 4.2 3.8 4.1  CL 100 101 106  CO2 26 23 24   GLUCOSE 154* 103* 81  BUN 52* 58* 70*  CREATININE 4.77* 5.00* 5.89*  CALCIUM 9.1 8.9 9.0  PHOS 5.6* 5.3* 4.9*   Liver Function Tests: Recent Labs  Lab 03/03/18 0215 03/04/18 0254 03/05/18 0239  ALBUMIN 2.7* 2.7* 2.6*   No results for input(s): LIPASE, AMYLASE in the last 168 hours. CBC: Recent Labs  Lab 02/28/18 0306 03/02/18 0242 03/03/18 0215 03/05/18 0719  WBC 7.9 9.3 10.8* 9.5  HGB 7.5* 7.2* 7.8* 7.4*  HCT 24.4* 24.3* 25.8* 25.2*  MCV 85.9 87.4 85.7 89.4  PLT 123* 140* 163 143*   Blood Culture    Component Value Date/Time   SDES BLOOD LEFT ARM 07/11/2017 0131   SPECREQUEST  07/11/2017 0131    BOTTLES DRAWN AEROBIC AND ANAEROBIC Blood Culture adequate volume   CULT  07/11/2017 0131    NO GROWTH 5 DAYS Performed at [facility] [address] [place], [place] [address]    REPTSTATUS 07/16/2017 FINAL 07/11/2017 0131    Cardiac Enzymes: No results for input(s): CKTOTAL, CKMB, CKMBINDEX, TROPONINI in the last 168 hours. CBG: Recent Labs  Lab 03/03/18 1714 03/03/18 2128 03/04/18 0804 03/04/18 1142 03/04/18 2109  GLUCAP 111* 113* 98  94 115*   Iron Studies: No results for input(s): IRON, TIBC, TRANSFERRIN, FERRITIN in the last 72 hours. @lablastinr3 @ Studies/Results: No results found. Medications:  . aspirin EC  81 mg Oral Daily  . busPIRone  10 mg Oral BID  . calcitRIOL  1 mcg Oral QODAY  . calcium acetate  1,334 mg Oral TID WC  . Chlorhexidine Gluconate Cloth  6 each Topical Q0600  . darbepoetin (ARANESP) injection - DIALYSIS  200 mcg Intravenous Once  . FLUoxetine  20 mg Oral Daily  . heparin  5,000 Units Subcutaneous Q8H  . insulin aspart  0-5 Units Subcutaneous QHS  . insulin aspart  0-9 Units Subcutaneous TID WC  . insulin glargine  10 Units Subcutaneous Daily  . metoprolol succinate  12.5 mg Oral QHS  . mirtazapine  7.5 mg Oral QHS  . multivitamin  1 tablet Oral QHS  . rosuvastatin  10 mg Oral q1800  . tamsulosin  0.4 mg Oral Daily  . thiamine  100 mg Oral Daily     1 ESRD 1st HD 02/25/18 ESRD 2/2 DM/HTN - Still voloverloaded but will eventually get down to EDW. No acute indication for dialysis today. - Continue lowering volume as tolerated.  - Has outpt TTS spot at [facility] but not ready for d/c  yet. 2 Anemia esa/Fe -HGB 7.4. Completing Fe load, getting Aranesp 200 mcg IV today. Follow HGB transfuse if HGB falls below 7.0.  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 Check PTH, use 2.0 Ca bath. Phos 4.9 No binders yet 5 Obesity 6 OSA 7 CADs/p CABG 8 Arrhythmias 9 Neuropathy 10 Deconditioning with h/o right leg trauma and hip pain -> appears to need SNF and SW working on. 11 Psychosis - seen by Psych-possible medically related delirium. Still confused with sitter. H/O bipolar disorder.  12. New R brachiocephalic AVF 03/00/92-ZRAQ faint bruit lower portion of AVF. Ask VVS to assess.   Rita H. Brown NP-C 03/05/2018, 9:06 AM  [employer] [phone]  I have seen and examined this patient and agree with the plan of care. Pt seen on HD 3/2.25 UF 3.2 L net    126/59  Still  confused but pleasant. Coarse tremors present. Refused CPAP and did not sleep well.  Dwana Melena, MD 03/05/2018, 10:07 AM

## 2018-03-05 NOTE — Progress Notes (Signed)
PROGRESS NOTE    Matthew [name]   OZH:[id number]  DOB: [birth year]  DOA: 02/24/2018 PCP: [name], MD   Brief Narrative:  Matthew [name] 52-year-old male with a past medical history of coronary artery disease status post bypass, history of cardiac arrest status post pacemaker/AICD, chronic kidney disease stage IV, diabetes mellitus type 2, hyperlipidemia, history of obstructive sleep apnea recently diagnosed hospitalized in April for hand cellulitis presented with somnolence falls patient was noted to have worsening renal function with a creatinine up to 7.4 from his baseline of 3.1.  He was also found to have urinary retention.  He was hospitalized for further management.  Nephrology was consulted.  Patient was started on dialysis.   Subjective: He states he is thirsty. No other complaints.   Assessment & Plan:   Principal Problem:   ESRD (end stage renal disease)-  SHPT   Chronic combined systolic and diastolic CHF (congestive heart failure)   - fluid management per renal team-   Active Problems:   Depression with anxiety   Acute encephalopathy- hallucinations - CT head negative - at home on Buspar BID, Prozac daily, Remeron QHS - he received Ativan on 30 and 31st. -  - psych recommending PRN Haldol and Remeron dose decreased from 30 to 7.5    Diabetes mellitus, insulin dependent (IDDM), uncontrolled  -cont Lantus and SSI    Coronary atherosclerosis of native coronary artery - s/p CABG - cont ASA      OSA (obstructive sleep apnea) - CPAP at bedtime    Anemia of chronic disease  - Hb ~ 7- management per renal   DVT prophylaxis: Heparin Code Status: Full code Family Communication:  Disposition Plan: cont dialysis Consultants:   nephro  psych  Antimicrobials:  Anti-infectives (From admission, onward)   Start     Dose/Rate Route Frequency Ordered Stop   02/20/2018 0600  vancomycin (VANCOCIN) IVPB 1000 mg/200 mL premix  Status:  Discontinued     1,000  mg 200 mL/hr over 60 Minutes Intravenous On call to O.R. 02/25/18 1354 02/25/18 1355   02/18/2018 0600  vancomycin (VANCOCIN) IVPB 1000 mg/200 mL premix     1,000 mg 200 mL/hr over 60 Minutes Intravenous To Short Stay 02/25/18 1355 02/03/2018 1015       Objective: Vitals:   03/05/18 1015 03/05/18 1030 03/05/18 1100 03/05/18 1112  BP: (!) 104/32 (!) 138/58 114/79 (!) 108/59  Pulse: 83 89 100 81  Resp:    19  Temp:    98.2 F (36.8 C)  TempSrc:    Oral  SpO2:    99%  Weight:    104.6 kg  Height:        Intake/Output Summary (Last 24 hours) at 03/05/2018 1620 Last data filed at 03/05/2018 1112 Gross per 24 hour  Intake -  Output 3000 ml  Net -3000 ml   Filed Weights   03/04/18 0500 03/05/18 0705 03/05/18 1112  Weight: 112.6 kg 109.3 kg 104.6 kg    Examination: General exam: Appears comfortable  HEENT: PERRLA, oral mucosa moist, no sclera icterus or thrush Respiratory system: Clear to auscultation. Respiratory effort normal. Cardiovascular system: S1 & S2 heard, RRR.   Gastrointestinal system: Abdomen soft, non-tender, nondistended. Normal bowel sounds. Central nervous system: Alert and oriented to place and person.   Extremities: No cyanosis, clubbing or edema Skin: No rashes or ulcers    Data Reviewed: I have personally reviewed following labs and imaging studies  CBC: Recent Labs  Lab 02/28/18  [id number] 03/02/18 0242 03/03/18 0215 03/05/18 0719  WBC 7.9 9.3 10.8* 9.5  HGB 7.5* 7.2* 7.8* 7.4*  HCT 24.4* 24.3* 25.8* 25.2*  MCV 85.9 87.4 85.7 89.4  PLT 123* 140* 163 921*   Basic Metabolic Panel: Recent Labs  Lab 03/01/18 0229 03/02/18 0242 03/03/18 0215 03/04/18 0254 03/05/18 0239  NA 139 138 137 138 143  K 4.4 4.5 4.2 3.8 4.1  CL 103 103 100 101 106  CO2 26 24 26 23 24   GLUCOSE 100* 115* 154* 103* 81  BUN 61* 69* 52* 58* 70*  CREATININE 5.05* 5.41* 4.77* 5.00* 5.89*  CALCIUM 9.0 8.9 9.1 8.9 9.0  PHOS 6.6* 6.4* 5.6* 5.3* 4.9*   GFR: Estimated  Creatinine Clearance: 17.1 mL/min (A) (by C-G formula based on SCr of 5.89 mg/dL (H)). Liver Function Tests: Recent Labs  Lab 03/01/18 0229 03/02/18 0242 03/03/18 0215 03/04/18 0254 03/05/18 0239  ALBUMIN 2.4* 2.6* 2.7* 2.7* 2.6*   No results for input(s): LIPASE, AMYLASE in the last 168 hours. No results for input(s): AMMONIA in the last 168 hours. Coagulation Profile: No results for input(s): INR, PROTIME in the last 168 hours. Cardiac Enzymes: No results for input(s): CKTOTAL, CKMB, CKMBINDEX, TROPONINI in the last 168 hours. BNP (last 3 results) No results for input(s): PROBNP in the last 8760 hours. HbA1C: No results for input(s): HGBA1C in the last 72 hours. CBG: Recent Labs  Lab 03/04/18 1142 03/04/18 2109 03/05/18 0929 03/05/18 1040 03/05/18 1225  GLUCAP 94 115* 73 112* 87   Lipid Profile: No results for input(s): CHOL, HDL, LDLCALC, TRIG, CHOLHDL, LDLDIRECT in the last 72 hours. Thyroid Function Tests: Recent Labs    03/04/18 0254  TSH 3.088   Anemia Panel: No results for input(s): VITAMINB12, FOLATE, FERRITIN, TIBC, IRON, RETICCTPCT in the last 72 hours. Urine analysis:    Component Value Date/Time   COLORURINE YELLOW 02/23/2018 0657   APPEARANCEUR HAZY (A) 02/23/2018 0657   LABSPEC 1.025 02/23/2018 0657   PHURINE 5.5 02/23/2018 0657   GLUCOSEU 250 (A) 02/23/2018 0657   HGBUR LARGE (A) 02/23/2018 0657   BILIRUBINUR NEGATIVE 02/23/2018 0657   KETONESUR 15 (A) 02/23/2018 0657   PROTEINUR >300 (A) 02/23/2018 0657   UROBILINOGEN 1.0 08/31/2009 1526   NITRITE NEGATIVE 02/23/2018 0657   LEUKOCYTESUR NEGATIVE 02/23/2018 0657   Sepsis Labs: @LABRCNTIP (procalcitonin:4,lacticidven:4) )No results found for this or any previous visit (from the past 240 hour(s)).       Radiology Studies: No results found.    Scheduled Meds: . aspirin EC  81 mg Oral Daily  . busPIRone  10 mg Oral BID  . calcitRIOL  1 mcg Oral QODAY  . calcium acetate  1,334 mg  Oral TID WC  . Chlorhexidine Gluconate Cloth  6 each Topical Q0600  . FLUoxetine  20 mg Oral Daily  . heparin  5,000 Units Subcutaneous Q8H  . insulin aspart  0-5 Units Subcutaneous QHS  . insulin aspart  0-9 Units Subcutaneous TID WC  . insulin glargine  10 Units Subcutaneous Daily  . metoprolol succinate  12.5 mg Oral QHS  . mirtazapine  7.5 mg Oral QHS  . multivitamin  1 tablet Oral QHS  . rosuvastatin  10 mg Oral q1800  . tamsulosin  0.4 mg Oral Daily  . thiamine  100 mg Oral Daily   Continuous Infusions:   LOS: 9 days    Time spent in minutes: 35    Debbe Odea, MD [facility] Pager: [email or web address] Password [facility] 03/05/2018,  4:20 PM

## 2018-03-06 ENCOUNTER — Inpatient Hospital Stay (HOSPITAL_COMMUNITY): Payer: Medicare Other

## 2018-03-06 DIAGNOSIS — G92 Toxic encephalopathy: Secondary | ICD-10-CM

## 2018-03-06 DIAGNOSIS — G4733 Obstructive sleep apnea (adult) (pediatric): Secondary | ICD-10-CM

## 2018-03-06 LAB — RENAL FUNCTION PANEL
Albumin: 2.7 g/dL — ABNORMAL LOW (ref 3.5–5.0)
Anion gap: 11 (ref 5–15)
BUN: 35 mg/dL — ABNORMAL HIGH (ref 6–20)
CO2: 26 mmol/L (ref 22–32)
Calcium: 9 mg/dL (ref 8.9–10.3)
Chloride: 105 mmol/L (ref 98–111)
Creatinine, Ser: 4.21 mg/dL — ABNORMAL HIGH (ref 0.61–1.24)
GFR calc Af Amer: 17 mL/min — ABNORMAL LOW (ref >60)
GFR, EST NON AFRICAN AMERICAN: 15 mL/min — AB (ref >60)
Glucose, Bld: 121 mg/dL — ABNORMAL HIGH (ref 70–99)
Phosphorus: 5 mg/dL — ABNORMAL HIGH (ref 2.5–4.6)
Potassium: 3.7 mmol/L (ref 3.5–5.1)
Sodium: 142 mmol/L (ref 135–145)

## 2018-03-06 LAB — GLUCOSE, CAPILLARY
GLUCOSE-CAPILLARY: 105 mg/dL — AB (ref 70–99)
Glucose-Capillary: 104 mg/dL — ABNORMAL HIGH (ref 70–99)
Glucose-Capillary: 133 mg/dL — ABNORMAL HIGH (ref 70–99)
Glucose-Capillary: 182 mg/dL — ABNORMAL HIGH (ref 70–99)

## 2018-03-06 LAB — AMMONIA: Ammonia: 13 umol/L (ref 9–35)

## 2018-03-06 NOTE — Plan of Care (Signed)
Pt is a high safety risk due to mental status and poor safety judgement.

## 2018-03-06 NOTE — Consult Note (Addendum)
Neurology Consultation  Reason for Consult: Altered mental status and hallucinations Referring Physician: Rizwan  CC: Altered mental status and hallucinations  History is obtained from: Chart  HPI: Matthew [name] is a 53 y.o. male with history of diabetes, thyroid disease, rest leg syndrome, hypertension associated stage III chronic kidney disease, neuropathy, obesity, hypertension,, anxiety, cardiac arrest, chronic systolic heart failure hyperlipidemia.  Patient was brought to the hospital secondary to no insufficiency.  Patient was started on dialysis and over the past 24 hours he noted to have altered mental status hallucinations.  At this point his Ambien has been held, baclofen has been held, his Remeron dose has been decreased along with Ativan held.  Over the last 6 days his BUN has decreased from 81 down to 35 and his creatinine has decreased from 6.21 down to 4.21.  Patient remains very hypervigilant, slightly agitated, will not talk to he is mumbling but is aware that he is in the hospital.  Allergy was consulted secondary to the fact that patient has remained altered and hallucinating.  GFR has increased from 9-15  ROS:  Unable to obtain due to altered mental status.   Past Medical History:  Diagnosis Date  . Abnormality of gait 04/08/2013  . AICD (automatic cardioverter/defibrillator) present   . Anxiety    stress  . Anxiety and depression   . CAD (coronary artery disease)    a. OOH MI 6/11; presented with acute CHF; hosp course c/b VF arrest with VDRF, etc;   b s/p CABG: L-LAD/Dx, S-OM/dCFX;  c.Nuclear scan 8/12:  Inferior and inf-lat scar with minimal peri-infarct ischemia, EF 53%.; d.LHC 9/12:  LAD 90%, pD1 (small) 70%, prox large Dx 95%, L-LAD and Dx ok, CFX occluded, S-OM and CFX ok, RCA prox to prox/mid 60-70%.  Medical management     . Cardiac arrest - ventricular fibrillation 08/26/2009  . Carpal tunnel syndrome of right wrist   . Cellulitis 2012   spider bite  .  Cellulitis 06/2017   right upper extremity  . Chronic systolic heart failure ([place])   . CKD (chronic kidney disease)   . CKD stage 4 due to type 2 diabetes mellitus ([place]) 04/11/2016  . Coma ([place])   . Diabetic coma with ketoacidosis ([place])    Occured in 1994 where he spent 30 days in a coma . He develpoed infection of his leg and developing pancreatitis during that  time   . Diabetic polyneuropathy ([place]) 04/08/2013  . DM2 (diabetes mellitus, type 2) ([place])   . Dyslipidemia associated with type 2 diabetes mellitus ([place]) 09/26/2016  . Dysrhythmia   . Foot drop, bilateral 04/08/2013  . Headache(784.0)    sinus  . History of degenerative disc disease   . HLD (hyperlipidemia)   . HTN (hypertension)   . Hypercholesteremia   . Hypertension associated with stage 3 chronic kidney disease due to type 2 diabetes mellitus ([place]) 09/26/2016  . Ischemic cardiomyopathy    echo 10/11:  inf-septal and apical HK, EF 35%, mod LAE  . Neuropathy   . Nocturnal leg cramps   . Nocturnal leg cramps 04/02/2017  . Obesity   . OSA (obstructive sleep apnea) 02/02/2012  . Pancreatitis 2008  . Periodic limb movement 04/02/2017  . 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 Presence of permanent cardiac pacemaker   . Retinal detachment    Bilateral, laser surgery on the right  . RLS (restless legs syndrome) 04/22/2015  . Thyroid disease   . Type II diabetes mellitus with renal  manifestations ([place]) 07/10/2017  . 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 Uncontrolled type 2 diabetes mellitus with diabetic neuropathy, with long-term current use of insulin ([place]) 09/26/2016  . 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 Uncontrolled type 2 diabetes mellitus with stage 4 chronic kidney disease, with long-term current use of insulin ([place]) 09/26/2016     Family History  Problem Relation Age of Onset  . Allergies  Mother   . Diabetes Mother   . Hypertension Mother   . Hyperlipidemia Mother   . Dementia Mother   . Colon cancer Mother   . Lung cancer Mother   . Kidney cancer Mother   . Colon polyps Mother   . Stroke Father   . Allergies Father   . Dementia Father   . Alcohol abuse Father   . Colon polyps Father   . Diabetes Sister   . Heart disease Sister   . Anxiety disorder Sister   . Cancer Maternal Grandmother        unknown type  . Schizophrenia Paternal Uncle   . Schizophrenia Cousin      Social History:   reports that he has quit smoking. His smoking use included cigars. His smokeless tobacco use includes snuff. He reports that he does not drink alcohol or use drugs.  Medications  Current Facility-Administered Medications:  .  acetaminophen (TYLENOL) tablet 650 mg, 650 mg, Oral, Q6H PRN, 650 mg at 03/05/18 1841 **OR** acetaminophen (TYLENOL) suppository 650 mg, 650 mg, Rectal, Q6H PRN, Eveland, Matthew, PA-C .  aspirin EC tablet 81 mg, 81 mg, Oral, Daily, Dagoberto Ligas, PA-C, 81 mg at 03/06/18 1032 .  busPIRone (BUSPAR) tablet 10 mg, 10 mg, Oral, BID, Dagoberto Ligas, PA-C, 10 mg at 03/06/18 0057 .  calcitRIOL (ROCALTROL) capsule 1 mcg, 1 mcg, Oral, Nat Math, MD, 1 mcg at 03/03/18 0858 .  calcium acetate (PHOSLO) capsule 1,334 mg, 1,334 mg, Oral, TID WC, Bonnielee Haff, MD, 1,334 mg at 03/04/18 1801 .  Chlorhexidine Gluconate Cloth 2 % PADS 6 each, 6 each, Topical, Q0600, Edrick Oh, MD, 6 each at 03/06/18 [phone] .  feeding supplement (NEPRO CARB STEADY) liquid 237 mL, 237 mL, Oral, QID, Rizwan, Saima, MD, 237 mL at 03/06/18 0056 .  FLUoxetine (PROZAC) capsule 20 mg, 20 mg, Oral, Daily, Dagoberto Ligas, PA-C, 20 mg at 03/06/18 1034 .  heparin injection 5,000 Units, 5,000 Units, Subcutaneous, Q8H, Dagoberto Ligas, PA-C, 5,000 Units at 03/06/18 2683 .  HYDROcodone-acetaminophen (NORCO/VICODIN) 5-325 MG per tablet 1-2 tablet, 1-2 tablet, Oral, Q4H PRN, Dagoberto Ligas, PA-C .  insulin aspart (novoLOG) injection 0-5 Units, 0-5 Units, Subcutaneous, QHS, Dagoberto Ligas, PA-C, 2 Units at 02/23/18 2250 .  insulin aspart (novoLOG) injection 0-9 Units, 0-9 Units, Subcutaneous, TID WC, Dagoberto Ligas, PA-C, 1 Units at 03/03/18 1228 .  mirtazapine (REMERON) tablet 7.5 mg, 7.5 mg, Oral, QHS, Bonnielee Haff, MD, 7.5 mg at 03/06/18 0058 .  multivitamin (RENA-VIT) tablet 1 tablet, 1 tablet, Oral, QHS, Deterding, Jeneen Rinks, MD, 1 tablet at 03/06/18 0055 .  ondansetron (ZOFRAN) tablet 4 mg, 4 mg, Oral, Q6H PRN **OR** ondansetron (ZOFRAN) injection 4 mg, 4 mg, Intravenous, Q6H PRN, Dagoberto Ligas, PA-C .  rosuvastatin (CRESTOR) tablet 10 mg, 10 mg, Oral, q1800, Dagoberto Ligas, PA-C, 10 mg at 03/04/18 1802 .  sodium chloride (OCEAN) 0.65 % nasal spray 1 spray, 1 spray, Each Nare, PRN, Dagoberto Ligas, PA-C,  1 spray at 02/07/2018 2248 .  tamsulosin (FLOMAX) capsule 0.4 mg, 0.4 mg, Oral, Daily, Dagoberto Ligas, PA-C, 0.4 mg at 03/06/18 1033 .  thiamine (VITAMIN B-1) tablet 100 mg, 100 mg, Oral, Daily, Bonnielee Haff, MD, 100 mg at 03/06/18 1034   Exam: Current vital signs: BP 130/90 (BP Location: Left Arm)   Pulse 94   Temp 97.8 F (36.6 C) (Axillary)   Resp 20   Ht 5\' 9"  (1.753 m)   Wt 105.8 kg   SpO2 99%   BMI 34.44 kg/m  Vital signs in last 24 hours: Temp:  [97.7 F (36.5 C)-98.9 F (37.2 C)] 97.8 F (36.6 C) (01/01 0752) Pulse Rate:  [91-150] 94 (01/01 0752) Resp:  [18-20] 20 (12/31 2340) BP: (117-131)/(62-114) 130/90 (01/01 0752) SpO2:  [90 %-99 %] 99 % (01/01 1103) Weight:  [105.8 kg] 105.8 kg (01/01 0500)  Physical Exam  Constitutional: Appears well-developed and well-nourished.  Psych: Hypervigilant Eyes: No scleral injection HENT: No OP obstrucion Head: Normocephalic.  Cardiovascular: Normal rate and regular rhythm.  Respiratory: Effort normal, non-labored breathing GI: Soft.  No distension. There is no tenderness.  Skin:  WDI  Neuro: Mental Status: She is currently hypervigilant, fidgety, mumbling when not talked to however when asked to follow commands he is able to follow commands.  He does not know the date but he does know that he is in the hospital at this current time. Cranial Nerves: II: Visual Fields are full.   III,IV, VI: EOMI without ptosis or diploplia. Pupils are equal, round, and reactive to light.  V: Facial sensation is symmetric to temperature VII: Facial movement is symmetric.  VIII: hearing is intact to voice X: Uvula elevates symmetrically XI: Shoulder shrug is symmetric. XII: tongue is midline without atrophy or fasciculations.  Motor: Tone is normal. Bulk is normal.  Moving all extremities antigravity with asterixis noted in the upper extremities when arms are held out in extension. Sensory: Sensation is symmetric to light touch  Deep Tendon Reflexes: Deep tendon reflexes are not appreciated throughout Cerebellar:  finger-nose is jerky and at times he past points   Labs I have reviewed labs in epic and the results pertinent to this consultation are:   CBC    Component Value Date/Time   WBC 9.5 03/05/2018 0719   RBC 2.82 (L) 03/05/2018 0719   HGB 7.4 (L) 03/05/2018 0719   HCT 25.2 (L) 03/05/2018 0719   PLT 143 (L) 03/05/2018 0719   MCV 89.4 03/05/2018 0719   MCH 26.2 03/05/2018 0719   MCHC 29.4 (L) 03/05/2018 0719   RDW 15.9 (H) 03/05/2018 0719   LYMPHSABS 0.8 02/25/2018 0250   MONOABS 0.8 02/25/2018 0250   EOSABS 0.2 02/25/2018 0250   BASOSABS 0.0 02/25/2018 0250    CMP     Component Value Date/Time   NA 142 03/06/2018 0259   K 3.7 03/06/2018 0259   CL 105 03/06/2018 0259   CO2 26 03/06/2018 0259   GLUCOSE 121 (H) 03/06/2018 0259   BUN 35 (H) 03/06/2018 0259   CREATININE 4.21 (H) 03/06/2018 0259   CREATININE 3.12 (H) 08/15/2017 1419   CALCIUM 9.0 03/06/2018 0259   PROT 6.3 (L) 02/23/2018 0554   PROT 7.0 04/08/2013 1449   ALBUMIN 2.7 (L) 03/06/2018 0259    AST 96 (H) 02/23/2018 0554   ALT 52 (H) 02/23/2018 0554   ALKPHOS 94 02/23/2018 0554   BILITOT 0.9 02/23/2018 0554   GFRNONAA 15 (L) 03/06/2018 0259   GFRNONAA 22 (L)  08/15/2017 1419   GFRAA 17 (L) 03/06/2018 0259   GFRAA 25 (L) 08/15/2017 1419    Lipid Panel     Component Value Date/Time   CHOL 127 03/30/2017 1119   TRIG 138 03/30/2017 1119   HDL 32 (L) 03/30/2017 1119   CHOLHDL 4.0 03/30/2017 1119   VLDL 24 09/20/2016 1244   LDLCALC 73 03/30/2017 1119   LDLDIRECT 159 (H) 10/02/2012 1535     Imaging   Etta Quill PA-C [facility] [phone]  M-F  (9:00 am- 5:00 PM)  03/06/2018, 11:30 AM     Assessment: Altered mental status in the setting of multiple medications and decreased renal function.  Patient's encephalopathy is most likely secondary to medication with slow clearance.  However given that we will still obtain secondary CT of head to make sure there is no intracranial abnormalities in addition we will also obtain EEG to confirm no seizure activity.  Recommendations: -EEG - CT of head -Ammonia -We will continue to follow  Attending addendum I have independently seen and examined the patient Agree with history physical documented above. Briefly 53 year old man with history of diabetes, thyroid disease, restless leg, hypertension, stage III chronic kidney disease, heart failure and hyperlipidemia was brought into the hospital for increased somnolence and found to be in complete renal failure.  He was started on dialysis and had been doing somewhat better after being dialyzed.  Over the past 24 to 48 hours has had hallucinations and worsening altered mental status. Chart review reveals that he was on multiple medications including Ambien, baclofen, Remeron and also Ativan. He is unable to provide any meaningful history. On examination, he is awake, staring into space, appears very fidgety almost like he has akathisia, responds to some questions,  follows simple commands but has poor attention concentration.  He knows the date but does not know the fact that he is in a hospital.  He was able to tell me the correct city. Cranial nerve examination reveals equal pupils round reactive light, extraocular movements intact, visual fields full, face symmetric, facial sensation symmetric and intact bilaterally, auditory acuity intact, palate midline, shoulder shrug intact and tongue midline. Motor examination reveals no focal weakness and he is at least 5/5 antigravity in all 4 extremities with prominent asterixis in both upper extremities.  Sensory exam is intact light touch all over.  Deep tendon reflexes are blunted all over.  Cerebellar exam with gross course tremor with some past-pointing bilaterally  Labs reveal creatinine of 4.21, albumin 2.7, AST 96, ALT 52, normal ammonia on arrival.  Assessment: Most likely explanation for his current altered mental status is toxic metabolic encephalopathy in the setting of multiple sedating medications and acute deranged renal function. He was on multiple medications that are renally cleared, and although he is being dialyzed, I would expect the side effects to resolve with time. I would like to rule out seizures as an underlying etiology and for that reason an EEG would be ordered.  Recommendations: EEG CT head (cannot get MRI because of AICD) Repeat ammonia level Minimize sedating medications especially Ativan during night  -- Amie Portland, MD [facility] Pager: [phone] If 7pm to 7am, please call on call as listed on AMION.

## 2018-03-06 NOTE — Progress Notes (Addendum)
PROGRESS NOTE    Matthew [name]   VPX:[id number]  DOB: Aug 19, 1965  DOA: 02/21/2018 PCP: Matthew Helper, MD   Brief Narrative:  Matthew [name] 53-year-old male with a past medical history of coronary artery disease status post bypass, history of cardiac arrest status post pacemaker/AICD, chronic kidney disease stage IV, diabetes mellitus type 2, hyperlipidemia, history of obstructive sleep apnea recently diagnosed hospitalized in April for hand cellulitis presented with somnolence falls patient was noted to have worsening renal function with a creatinine up to 7.4 from his baseline of 3.1.  He was also found to have urinary retention.  He was hospitalized for further management.  Nephrology was consulted.  Patient was started on dialysis.   Subjective: He states he is thirsty. No other complaints.   Assessment & Plan:   Principal Problem:   ESRD (end stage renal disease)-  SHPT   Chronic combined systolic and diastolic CHF (congestive heart failure) - last ECHO 5/19- EF 45-50% grade 2 D CHF   - R brachiocephalic AVF 46/27/03 - fluid management per renal team- renal still feels he is volume overloaded - has a dialysis spot for TTS  Active Problems:   Depression with anxiety   Acute encephalopathy- hallucinations - information is gleaned from notes as I did not start to evaluate him until 12/31 - acute encephalopathy on admission apparently resolved once dialysis started and he was eating, drinking, communicating and ambulating with PT - noted to  become confused on 12/29 (Ambien given on the night of 12/26 and again on 12/27) -  Psych consulted on 12/30 psych recommending starting PRN Haldol and decreasing Remeron dose from 30 to 7.5 - Haldol started on 12/29 - given multiple doses - Given Ativan @ night on 12/30 and 12/31 (by on call night provider that was called by RN- it was not ordered by rounding doctors) - at home he is on Buspar BID, Prozac daily, Remeron QHS which have  been continued as well - he is quite altered now- I am recommending we stop Haldol and Ativan completely for now- will hold AM dose of Buspar as he just received Ativan in the middle of the night- can cont Prozac and Remeron (at the reduced dose)- Neuro consulted as well for their input  Low grade fever  - 100.9 on 12/31 - has not recurred    Diabetes mellitus, insulin dependent (IDDM), uncontrolled  - Managed at home with Tyler Aas -has not eaten today- sugars in 700-100s- will need to hold Lantus  -can use SSI if sugar is high/ if he eats   Coronary atherosclerosis of native coronary artery- s/p CABG HTN - on ASA and Metoprolol  Urinary retention/ BPH - on time of admission- foley was required- this issue has subsequently resolved - cont Flomax  Pacemaker / AICD    OSA (obstructive sleep apnea) - underwent a sleep study recently recommended BiPAP due to suboptimal CPAP titration and his outpt providers are pursuing a BiPAP for him      Anemia of chronic disease  - Hb ~ 7- management per renal with Iron and Aranesp - renal is recommending transfusion if Hb < 7  Obesity Body mass index is 34.44 kg/m.    DVT prophylaxis: Heparin Code Status: Full code Family Communication:  Disposition Plan: cont dialysis- needs SNF on d/c Consultants:   nephro  Psych  Vascular surgery  Neuro   Antimicrobials:  Anti-infectives (From admission, onward)   Start     Dose/Rate Route Frequency Ordered  Stop   03/05/2018 0600  vancomycin (VANCOCIN) IVPB 1000 mg/200 mL premix  Status:  Discontinued     1,000 mg 200 mL/hr over 60 Minutes Intravenous On call to O.R. 02/25/18 1354 02/25/18 1355   02/06/2018 0600  vancomycin (VANCOCIN) IVPB 1000 mg/200 mL premix     1,000 mg 200 mL/hr over 60 Minutes Intravenous To Short Stay 02/25/18 1355 02/14/2018 1015       Objective: Vitals:   03/05/18 1715 03/05/18 2340 03/06/18 0500 03/06/18 0752  BP: (!) 131/114 117/62  130/90  Pulse: (!) 150 91   94  Resp: 18 20    Temp: 98.9 F (37.2 C) 97.7 F (36.5 C)  97.8 F (36.6 C)  TempSrc: Oral Oral  Axillary  SpO2: 90% 98%  91%  Weight:   105.8 kg   Height:        Intake/Output Summary (Last 24 hours) at 03/06/2018 1039 Last data filed at 03/05/2018 1112 Gross per 24 hour  Intake -  Output 3000 ml  Net -3000 ml   Filed Weights   03/05/18 0705 03/05/18 1112 03/06/18 0500  Weight: 109.3 kg 104.6 kg 105.8 kg    Examination: General exam: Appears comfortable  HEENT: PERRLA, oral mucosa moist, no sclera icterus or thrush Respiratory system: Clear to auscultation. Respiratory effort normal. Cardiovascular system: S1 & S2 heard, RRR.   Gastrointestinal system: Abdomen soft, non-tender, nondistended. Normal bowel sounds. Central nervous system: Alert and oriented to place and person.   Extremities: No cyanosis, clubbing or edema Skin: abnormal affect - no eye contact when speaking- falls asleep with eyes open and snores    Data Reviewed: I have personally reviewed following labs and imaging studies  CBC: Recent Labs  Lab 02/28/18 0306 03/02/18 0242 03/03/18 0215 03/05/18 0719  WBC 7.9 9.3 10.8* 9.5  HGB 7.5* 7.2* 7.8* 7.4*  HCT 24.4* 24.3* 25.8* 25.2*  MCV 85.9 87.4 85.7 89.4  PLT 123* 140* 163 253*   Basic Metabolic Panel: Recent Labs  Lab 03/02/18 0242 03/03/18 0215 03/04/18 0254 03/05/18 0239 03/06/18 0259  NA 138 137 138 143 142  K 4.5 4.2 3.8 4.1 3.7  CL 103 100 101 106 105  CO2 24 26 23 24 26   GLUCOSE 115* 154* 103* 81 121*  BUN 69* 52* 58* 70* 35*  CREATININE 5.41* 4.77* 5.00* 5.89* 4.21*  CALCIUM 8.9 9.1 8.9 9.0 9.0  PHOS 6.4* 5.6* 5.3* 4.9* 5.0*   GFR: Estimated Creatinine Clearance: 24 mL/min (A) (by C-G formula based on SCr of 4.21 mg/dL (H)). Liver Function Tests: Recent Labs  Lab 03/02/18 0242 03/03/18 0215 03/04/18 0254 03/05/18 0239 03/06/18 0259  ALBUMIN 2.6* 2.7* 2.7* 2.6* 2.7*   No results for input(s): LIPASE, AMYLASE in the  last 168 hours. No results for input(s): AMMONIA in the last 168 hours. Coagulation Profile: No results for input(s): INR, PROTIME in the last 168 hours. Cardiac Enzymes: No results for input(s): CKTOTAL, CKMB, CKMBINDEX, TROPONINI in the last 168 hours. BNP (last 3 results) No results for input(s): PROBNP in the last 8760 hours. HbA1C: No results for input(s): HGBA1C in the last 72 hours. CBG: Recent Labs  Lab 03/05/18 1225 03/05/18 1707 03/05/18 1858 03/05/18 2130 03/06/18 0755  GLUCAP 87 62* 76 72 105*   Lipid Profile: No results for input(s): CHOL, HDL, LDLCALC, TRIG, CHOLHDL, LDLDIRECT in the last 72 hours. Thyroid Function Tests: Recent Labs    03/04/18 0254  TSH 3.088   Anemia Panel: No results for input(s):  VITAMINB12, FOLATE, FERRITIN, TIBC, IRON, RETICCTPCT in the last 72 hours. Urine analysis:    Component Value Date/Time   COLORURINE YELLOW 02/23/2018 0657   APPEARANCEUR HAZY (A) 02/23/2018 0657   LABSPEC 1.025 02/23/2018 0657   PHURINE 5.5 02/23/2018 0657   GLUCOSEU 250 (A) 02/23/2018 0657   HGBUR LARGE (A) 02/23/2018 0657   BILIRUBINUR NEGATIVE 02/23/2018 0657   KETONESUR 15 (A) 02/23/2018 0657   PROTEINUR >300 (A) 02/23/2018 0657   UROBILINOGEN 1.0 08/31/2009 1526   NITRITE NEGATIVE 02/23/2018 0657   LEUKOCYTESUR NEGATIVE 02/23/2018 0657   Sepsis Labs: @LABRCNTIP (procalcitonin:4,lacticidven:4) )No results found for this or any previous visit (from the past 240 hour(s)).       Radiology Studies: No results found.    Scheduled Meds: . aspirin EC  81 mg Oral Daily  . busPIRone  10 mg Oral BID  . calcitRIOL  1 mcg Oral QODAY  . calcium acetate  1,334 mg Oral TID WC  . Chlorhexidine Gluconate Cloth  6 each Topical Q0600  . feeding supplement (NEPRO CARB STEADY)  237 mL Oral QID  . FLUoxetine  20 mg Oral Daily  . heparin  5,000 Units Subcutaneous Q8H  . insulin aspart  0-5 Units Subcutaneous QHS  . insulin aspart  0-9 Units Subcutaneous  TID WC  . mirtazapine  7.5 mg Oral QHS  . multivitamin  1 tablet Oral QHS  . rosuvastatin  10 mg Oral q1800  . tamsulosin  0.4 mg Oral Daily  . thiamine  100 mg Oral Daily   Continuous Infusions:   LOS: 10 days    Time spent in minutes: 35    Debbe Odea, MD [facility] Pager: [email or web address] Password [facility] 03/06/2018, 10:39 AM

## 2018-03-06 NOTE — Progress Notes (Signed)
Bedside EEG completed, results pending. 

## 2018-03-06 NOTE — Progress Notes (Signed)
Matthew [name] Progress Note   53y/o DM HTN Bipolar d/o CASHD ICD w/ EF 35% advanced CKD followed by Dr Jolinda Croak at [place], initiated on dialysis for ESRD this hospitalization. Assessment/ Plan:   1 ESRD 1st HD 02/25/18 ESRD 2/2 DM/HTN - Still voloverloaded but will eventually get down to EDW. No acute indication for dialysis today. - Continue lowering volume as tolerated.  - [facility].  - Last HD Tues -> next HD planned for Thur  2 Anemia esa/Fe-HGB 7.4. Completing Fe load, Aranesp 200 mcg IV given 12/31. Follow HGB transfuse if HGB falls below 7.0.  Transfuse as needed 3 DM controlled 4 SHPT-Ca 9.0 C Ca 10.1. Check PTH, use 2.0 Ca bath. Phos 4.9 No binders yet 5 Obesity 6 OSA 7 CADs/p CABG 8 Arrhythmias 9 Neuropathy 10 Deconditioning with h/o right leg trauma and hip pain -> appears to need SNF and SW working on. 11 Psychosis - seen by Psych-possible medically related delirium/ metabolic encephalopathy. Still confused with sitter. H/O bipolar disorder.  12. New R brachiocephalic AVF 92/33/00-TMAU faint bruit lower portion of AVF. VVS has reassess and will f/u 6week post op office duplex.   Subjective:   Agitated, thrashing about in bed. Mumbling incoherently.     Objective:   BP 130/90 (BP Location: Left Arm)   Pulse 94   Temp 97.8 F (36.6 C) (Axillary)   Resp 20   Ht 5\' 9"  (1.753 m)   Wt 105.8 kg   SpO2 91%   BMI 34.44 kg/m   Intake/Output Summary (Last 24 hours) at 03/06/2018 0801 Last data filed at 03/05/2018 1112 Gross per 24 hour  Intake -  Output 3000 ml  Net -3000 ml   Weight change:   Physical Exam: General:Obese male agitated with sitter at bedside Heart: Q3,F3, 2/6 systolic M Lungs: Bilateral breath sounds decreased in bases, otherwise CTAB Abdomen: Obese active BS Extremities: trace L pedal edema, trace BLE edema Dialysis Access: RIJ TDC blood lines connected.   Imaging: No results  found.  Labs: BMET Recent Labs  Lab 02/28/18 0306 03/01/18 0229 03/02/18 0242 03/03/18 0215 03/04/18 0254 03/05/18 0239 03/06/18 0259  NA 138 139 138 137 138 143 142  K 4.1 4.4 4.5 4.2 3.8 4.1 3.7  CL 106 103 103 100 101 106 105  CO2 22 26 24 26 23 24 26   GLUCOSE 135* 100* 115* 154* 103* 81 121*  BUN 81* 61* 69* 52* 58* 70* 35*  CREATININE 6.21* 5.05* 5.41* 4.77* 5.00* 5.89* 4.21*  CALCIUM 8.7* 9.0 8.9 9.1 8.9 9.0 9.0  PHOS 7.3* 6.6* 6.4* 5.6* 5.3* 4.9* 5.0*   CBC Recent Labs  Lab 02/28/18 0306 03/02/18 0242 03/03/18 0215 03/05/18 0719  WBC 7.9 9.3 10.8* 9.5  HGB 7.5* 7.2* 7.8* 7.4*  HCT 24.4* 24.3* 25.8* 25.2*  MCV 85.9 87.4 85.7 89.4  PLT 123* 140* 163 143*    Medications:    . aspirin EC  81 mg Oral Daily  . busPIRone  10 mg Oral BID  . calcitRIOL  1 mcg Oral QODAY  . calcium acetate  1,334 mg Oral TID WC  . Chlorhexidine Gluconate Cloth  6 each Topical Q0600  . feeding supplement (NEPRO CARB STEADY)  237 mL Oral QID  . FLUoxetine  20 mg Oral Daily  . heparin  5,000 Units Subcutaneous Q8H  . insulin aspart  0-5 Units Subcutaneous QHS  . insulin aspart  0-9 Units Subcutaneous  TID WC  . insulin glargine  10 Units Subcutaneous Daily  . metoprolol succinate  12.5 mg Oral QHS  . mirtazapine  7.5 mg Oral QHS  . multivitamin  1 tablet Oral QHS  . rosuvastatin  10 mg Oral q1800  . tamsulosin  0.4 mg Oral Daily  . thiamine  100 mg Oral Daily      Otelia Santee, MD 03/06/2018, 8:01 AM

## 2018-03-06 NOTE — Progress Notes (Signed)
Pt with ongoing confusion today and hallucinations.  No sleep this shift.  Pt is able to intermittently answer orientation questions correctly.  Follows simple commands.  Remains very restless, grabbing at objects in air that are not present, sliding down in bed, chewing on IV tubing, speaking insensibly at times, etc.  Tele sitter in place.  MD aware of behavior.  Neuro in to see pt today.  EEG and CT ordered.  Will continue to orient pt, maintain safety measures, promote restful environment.

## 2018-03-06 NOTE — Procedures (Signed)
History: 53 year old male being evaluated for altered mental status  Sedation: None  Technique: This is a 21 channel routine scalp EEG performed at the bedside with bipolar and monopolar montages arranged in accordance to the international 10/20 system of electrode placement. One channel was dedicated to EKG recording.    Background: The background consists of intermixed alpha and beta activities. There is a well defined posterior dominant rhythm of 12 hz that attenuates with eye opening.  There is some suggestion of a asymmetry seen better on the right than left, but this is not definite by this exam.  Sleep is recorded with normal appearing structures.   Photic stimulation: Physiologic driving is not performed  EEG Abnormalities: 1) suggestion of left occipital dysfunction  Clinical Interpretation: This EEG was essentially normal. there was a suggestion of an attenuation of faster frequencies in the left occipital region suggesting cortical dysfunction, but this is not definite by this exam.  Correlation with imaging and clinical exam is advised.  There was no seizure or seizure predisposition recorded on this study. Please note that lack of epileptiform activity on EEG does not preclude the possibility of epilepsy.   Roland Rack, MD [facility] [phone]  If 7pm- 7am, please page neurology on call as listed in [place].

## 2018-03-06 NOTE — Progress Notes (Signed)
Pt continued to be restless overnight and did not sleep at all. Pt had hallucinations and was only oriented to self and time. Pt was also incontinent of urine and stool. Pt continues to have tremors and reach for objects that aren't there. Pt also seemed weaker from baseline, requiring more assistance to be cleaned up and repositioned in bed.

## 2018-03-06 DEATH — deceased

## 2018-03-07 LAB — CBC
HCT: 29.1 % — ABNORMAL LOW (ref 39.0–52.0)
Hemoglobin: 8.9 g/dL — ABNORMAL LOW (ref 13.0–17.0)
MCH: 27.9 pg (ref 26.0–34.0)
MCHC: 30.6 g/dL (ref 30.0–36.0)
MCV: 91.2 fL (ref 80.0–100.0)
Platelets: 174 10*3/uL (ref 150–400)
RBC: 3.19 MIL/uL — ABNORMAL LOW (ref 4.22–5.81)
RDW: 15.9 % — ABNORMAL HIGH (ref 11.5–15.5)
WBC: 10.5 10*3/uL (ref 4.0–10.5)
nRBC: 0.3 % — ABNORMAL HIGH (ref 0.0–0.2)

## 2018-03-07 LAB — RENAL FUNCTION PANEL
Albumin: 2.6 g/dL — ABNORMAL LOW (ref 3.5–5.0)
Anion gap: 11 (ref 5–15)
BUN: 47 mg/dL — ABNORMAL HIGH (ref 6–20)
CO2: 28 mmol/L (ref 22–32)
Calcium: 9 mg/dL (ref 8.9–10.3)
Chloride: 108 mmol/L (ref 98–111)
Creatinine, Ser: 5.41 mg/dL — ABNORMAL HIGH (ref 0.61–1.24)
GFR calc Af Amer: 13 mL/min — ABNORMAL LOW (ref >60)
GFR calc non Af Amer: 11 mL/min — ABNORMAL LOW (ref >60)
Glucose, Bld: 175 mg/dL — ABNORMAL HIGH (ref 70–99)
Phosphorus: 5.4 mg/dL — ABNORMAL HIGH (ref 2.5–4.6)
Potassium: 3.4 mmol/L — ABNORMAL LOW (ref 3.5–5.1)
Sodium: 147 mmol/L — ABNORMAL HIGH (ref 135–145)

## 2018-03-07 LAB — GLUCOSE, CAPILLARY
GLUCOSE-CAPILLARY: 230 mg/dL — AB (ref 70–99)
Glucose-Capillary: 168 mg/dL — ABNORMAL HIGH (ref 70–99)
Glucose-Capillary: 118 mg/dL — ABNORMAL HIGH (ref 70–99)
Glucose-Capillary: 126 mg/dL — ABNORMAL HIGH (ref 70–99)

## 2018-03-07 MED ORDER — HEPARIN SODIUM (PORCINE) 1000 UNIT/ML IJ SOLN
INTRAMUSCULAR | Status: AC
  Administered 2018-03-07: 3800 [IU]
  Filled 2018-03-07: qty 4

## 2018-03-07 MED ORDER — THIAMINE HCL 100 MG/ML IJ SOLN: 100.0000 mg | Freq: Every day | INTRAMUSCULAR | Status: DC

## 2018-03-07 MED ORDER — THIAMINE HCL 100 MG/ML IJ SOLN: 250.0000 mg | Freq: Every day | INTRAVENOUS | Status: DC

## 2018-03-07 MED ORDER — THIAMINE HCL 100 MG/ML IJ SOLN
500.0000 mg | INTRAVENOUS | Status: DC
  Administered 2018-03-07 – 2018-03-08 (×2): 500 mg via INTRAVENOUS
  Filled 2018-03-07 (×3): qty 5

## 2018-03-07 NOTE — Progress Notes (Addendum)
NEUROLOGY PROGRESS NOTE  Subjective: Patient states that he feels sleepy today but he does feel better  Exam: Vitals:   03/06/18 2348 03/07/18 0753  BP: 108/75 133/75  Pulse: 87 (!) 48  Resp:    Temp: 97.8 F (36.6 C) 98.5 F (36.9 C)  SpO2: 92% 92%    Physical Exam   HEENT-  Normocephalic, no lesions, without obvious abnormality.  Normal external eye and conjunctiva.   Extremities- Warm, dry and intact Musculoskeletal-no joint tenderness, deformity or swelling Skin-warm and dry, no hyperpigmentation, vitiligo, or suspicious lesions Neuro:  Mental Status: At this time patient is alert to being at [facility], he knows it is January, he notices 2020.  He is able to follow simple commands.  He still seems very fidgety and speech is slightly pressured/difficult to understand-poor attention and concentration  Cranial Nerves: II:  Visual fields grossly normal,  III,IV, VI: ptosis not present, extra-ocular motions intact bilaterally with saccadic pursuit.  Pupils equal, round, reactive to light and accommodation V,VII: smile symmetric, VIII: hearing normal bilaterally IX,X: uvula rises midline XI: bilateral shoulder shrug XII: midline tongue extension Motor: Moving all extremities antigravity.  Still notable asterixis.  He also has a postural and resting tremor. Sensory: Pinprick and light touch intact throughout, bilaterally Deep Tendon Reflexes: Not appreciated throughout Plantars: Right: downgoing   Left: downgoing Cerebellar: normal finger-to-nose,  Medications:  Scheduled: . aspirin EC  81 mg Oral Daily  . busPIRone  10 mg Oral BID  . calcitRIOL  1 mcg Oral QODAY  . calcium acetate  1,334 mg Oral TID WC  . Chlorhexidine Gluconate Cloth  6 each Topical Q0600  . feeding supplement (NEPRO CARB STEADY)  237 mL Oral QID  . FLUoxetine  20 mg Oral Daily  . heparin  5,000 Units Subcutaneous Q8H  . insulin aspart  0-5 Units Subcutaneous QHS  . insulin aspart  0-9 Units  Subcutaneous TID WC  . mirtazapine  7.5 mg Oral QHS  . multivitamin  1 tablet Oral QHS  . rosuvastatin  10 mg Oral q1800  . tamsulosin  0.4 mg Oral Daily  . [START ON 03/16/2018] thiamine injection  100 mg Intravenous Daily   Pertinent Labs/Diagnostics: EEG:  Clinical Interpretation: This EEG was essentially normal. there was a suggestion of an attenuation of faster frequencies in the left occipital region suggesting cortical dysfunction, but this is not definite by this exam.  Correlation with imaging and clinical exam is advised.  There was no seizure or seizure predisposition recorded on this study. Please note that lack of epileptiform activity on EEG does not preclude the possibility of epilepsy.   -Sodium 147 -Potassium 3.4 -Glucose 176 - BUNs 47 -Creatinine 5.41 -Phosphorus 5.4  Ct Head Wo Contrast Result Date: 03/06/2018 No acute finding    Etta Quill PA-C [facility] [phone]  ATTENDING ADDENDUM Seen and examined. No acute changes. Waxing/waning mentation but improved from yesterday.  Assessment: As noted before patient has stage III chronic kidney disease.  He was started on dialysis and had been doing somewhat better after being dialyzed.  Over the past 24 to 48 hours he had hallucinations or worsening of altered mental status.  He was put on multiple medications including Ambien, baclofen, Remeron and also Ativan.  After these medications have been DC'd and the decrease in Remeron it appears that he is improved today he is more alert and oriented and able to follow commands.  Patient still has asterixis and appears fidgety but overall doing  better.  EEG was done did not show any epileptiform activity however did show some cortical dysfunction in the occipital area.  Impression: -Metabolic encephalopathy secondary to medications  Recommendations: - We have increased his thiamine to 500 mg 3 times daily for 3 days, then decreased his thiamine to 250 mg  for the next 5 days, then he is to stay on thiamine 100 mg from that point on. -Continue decreasing sedating medications (as noted prior especially Ativan ) and medications which have also been DC'd  03/07/2018, 9:29 AM  -- Amie Portland, MD [facility] Pager: [phone] If 7pm to 7am, please call on call as listed on [facility].

## 2018-03-07 NOTE — Progress Notes (Signed)
Matthew [name] Progress Note   53y/o DM HTN Bipolar d/o CASHD ICD w/ EF 35% advanced CKD followed by Dr Jolinda Croak at [place], initiated on dialysis for ESRD this hospitalization. Assessment/ Plan:   1 ESRD 1st HD 02/25/18 ESRD 2/2 DM/HTN - HD today on THS schedule - [facility] yet.  2 Anemia esa/Fe-Completing Fe load, Aranesp 200 mcg IV given 12/31. 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 Check PTH, use 2.0 Ca bath. Phos <5.5 No binders yet 5 Obesity 6 OSA 7 CADs/p CABG 8 Arrhythmias 9 Neuropathy 10 Deconditioning with h/o right leg trauma and hip pain -> SNF or CIR and SW working on. 11 Psychosis - seen by Psych-possible medically related delirium/ metabolic encephalopathy.  H/O bipolar disorder.  12. New R brachiocephalic AVF 33/29/51-OACZ faint bruit lower portion of AVF. VVS has reassess and will f/u 6week post op office duplex.   Subjective:   Awake, interactve, calm     Objective:   BP 133/75 (BP Location: Left Arm)   Pulse (!) 48   Temp 98.5 F (36.9 C) (Oral)   Resp 20   Ht 5\' 9"  (1.753 m)   Wt 104.9 kg   SpO2 92%   BMI 34.15 kg/m   Intake/Output Summary (Last 24 hours) at 03/07/2018 1041 Last data filed at 03/07/2018 0847 Gross per 24 hour  Intake 500 ml  Output -  Net 500 ml   Weight change: -4.4 kg  Physical Exam: General:Obese malee NAD Heart: Y6,A6, 2/6 systolic M Lungs: Bilateral breath sounds decreased in bases, otherwise CTAB Abdomen: Obese active BS Extremities: trace L pedal edema, trace BLE edema Dialysis Access: RIJ [facility]  Imaging: Ct Head Wo Contrast  Result Date: 03/06/2018 CLINICAL DATA:  Altered mental status likely related to ethanol/substance abuse. EXAM: CT HEAD WITHOUT CONTRAST TECHNIQUE: Contiguous axial images were obtained from the base of the skull through the vertex without intravenous contrast. COMPARISON:  02/23/2018 FINDINGS: Brain: No evidence of acute infarction,  hemorrhage, hydrocephalus, extra-axial collection or mass lesion/mass effect. Vascular: Atherosclerotic calcification Skull: Osteopenic appearance.  No acute finding Sinuses/Orbits: Nasal septum perforation. IMPRESSION: No acute finding Electronically Signed   By: Monte Fantasia M.D.   On: 03/06/2018 16:36    Labs: BMET Recent Labs  Lab 03/01/18 0229 03/02/18 0242 03/03/18 0215 03/04/18 0254 03/05/18 0239 03/06/18 0259 03/07/18 0231  NA 139 138 137 138 143 142 147*  K 4.4 4.5 4.2 3.8 4.1 3.7 3.4*  CL 103 103 100 101 106 105 108  CO2 26 24 26 23 24 26 28   GLUCOSE 100* 115* 154* 103* 81 121* 175*  BUN 61* 69* 52* 58* 70* 35* 47*  CREATININE 5.05* 5.41* 4.77* 5.00* 5.89* 4.21* 5.41*  CALCIUM 9.0 8.9 9.1 8.9 9.0 9.0 9.0  PHOS 6.6* 6.4* 5.6* 5.3* 4.9* 5.0* 5.4*   CBC Recent Labs  Lab 03/02/18 0242 03/03/18 0215 03/05/18 0719  WBC 9.3 10.8* 9.5  HGB 7.2* 7.8* 7.4*  HCT 24.3* 25.8* 25.2*  MCV 87.4 85.7 89.4  PLT 140* 163 143*    Medications:    . aspirin EC  81 mg Oral Daily  . busPIRone  10 mg Oral BID  . calcitRIOL  1 mcg Oral QODAY  . calcium acetate  1,334 mg Oral TID WC  . Chlorhexidine Gluconate Cloth  6 each Topical Q0600  . feeding supplement (NEPRO CARB STEADY)  237 mL Oral QID  .  FLUoxetine  20 mg Oral Daily  . heparin  5,000 Units Subcutaneous Q8H  . insulin aspart  0-5 Units Subcutaneous QHS  . insulin aspart  0-9 Units Subcutaneous TID WC  . mirtazapine  7.5 mg Oral QHS  . multivitamin  1 tablet Oral QHS  . rosuvastatin  10 mg Oral q1800  . tamsulosin  0.4 mg Oral Daily  . [START ON 03/16/2018] thiamine injection  100 mg Intravenous Daily     Pearson Grippe MD 03/07/2018, 10:41 AM

## 2018-03-07 NOTE — Clinical Social Work Note (Signed)
PASARR received for SNF backup to CIR. CSW continuing to follow for CIR determination.  [place], [place]

## 2018-03-07 NOTE — Progress Notes (Signed)
Physical Therapy Treatment Patient Details Name: Matthew [name] MRN: [id number] DOB: 27-May-1965 Today's Date: 03/07/2018    History of Present Illness This 53 y.o. male admitted with somnolence adn falls.   Dx:  acute renal failure superimposed on CKD IV.  Pt started on ESRD 03/01/2018, acute encephalopathy, chronic combined systolic and diastolic HF.  PMH includes:  Bipolar disorder, IDDM, CAD,  OSA, diabetic polyneuropathy, food drop bilaterally,  morbid obesity     PT Comments    Pt following all cues and commands today however continues to be worse than prior visits, appears to be improving. Max A for bed mobility with strong posterior lean, able to correct with tactile and verbal cueing. Unsuccessful standing today but will cont to follow and as cognition improves feel patient will progress with therapy.     Follow Up Recommendations  CIR     Equipment Recommendations  None recommended by PT    Recommendations for Other Services       Precautions / Restrictions Precautions Precautions: Fall Restrictions Weight Bearing Restrictions: No    Mobility  Bed Mobility Overal bed mobility: Needs Assistance             General bed mobility comments: patient requires max A at this time to sit  EOB with strong posterior lean, able to self correct with verable and tactcile cueing  Transfers                 General transfer comment: attempted to stand several times patient unabel to coordinate and sliding off bed. retruend to bed for safety  Ambulation/Gait                 Stairs             Wheelchair Mobility    Modified Rankin (Stroke Patients Only)       Balance Overall balance assessment: Needs assistance Sitting-balance support: Feet supported Sitting balance-Leahy Scale: Good     Standing balance support: Bilateral upper extremity supported;During functional activity;Single extremity supported Standing balance-Leahy Scale: Poor                               Cognition Arousal/Alertness: Awake/alert Behavior During Therapy: WFL for tasks assessed/performed Overall Cognitive Status: Impaired/Different from baseline Area of Impairment: Attention;Safety/judgement;Awareness;Problem solving                   Current Attention Level: Alternating           General Comments: patient with metabolic encephalopathy today 1/2 able to follow all commands       Exercises      General Comments        Pertinent Vitals/Pain Pain Assessment: Faces Faces Pain Scale: Hurts a little bit Pain Location: low back Pain Descriptors / Indicators: Spasm Pain Intervention(s): Limited activity within patient's tolerance;Monitored during session    Home Living                      Prior Function            PT Goals (current goals can now be found in the care plan section) Acute Rehab PT Goals Patient Stated Goal: to go home and take care of father  PT Goal Formulation: With patient Time For Goal Achievement: 03/14/18 Potential to Achieve Goals: Good Progress towards PT goals: Progressing toward goals    Frequency    Min 3X/week  PT Plan Current plan remains appropriate    Co-evaluation              AM-PAC PT "6 Clicks" Mobility   Outcome Measure  Help needed turning from your back to your side while in a flat bed without using bedrails?: None Help needed moving from lying on your back to sitting on the side of a flat bed without using bedrails?: A Little Help needed moving to and from a bed to a chair (including a wheelchair)?: A Little Help needed standing up from a chair using your arms (e.g., wheelchair or bedside chair)?: A Little Help needed to walk in hospital room?: A Little Help needed climbing 3-5 steps with a railing? : A Lot 6 Click Score: 18    End of Session Equipment Utilized During Treatment: Gait belt Activity Tolerance: Patient tolerated treatment  well Patient left: in chair;with call bell/phone within reach;with chair alarm set Nurse Communication: Mobility status PT Visit Diagnosis: Unsteadiness on feet (R26.81);Difficulty in walking, not elsewhere classified (R26.2)     Time: 9396-8864 PT Time Calculation (min) (ACUTE ONLY): 15 min  Charges:  $Therapeutic Activity: 8-22 mins                     Reinaldo Berber, PT, DPT Acute Rehabilitation Services Pager: [phone] Office: [phone]     Reinaldo Berber 03/07/2018, 10:04 AM

## 2018-03-07 NOTE — Progress Notes (Addendum)
PROGRESS NOTE    Matthew [name]   JIR:[id number]  DOB: [birth year]  DOA: 02/05/2018 PCP: [name], MD   Brief Narrative:  Matthew [name] 53-year-old male with a past medical history of coronary artery disease status post bypass, history of cardiac arrest status post pacemaker/AICD, chronic kidney disease stage IV, diabetes mellitus type 2, hyperlipidemia, history of obstructive sleep apnea recently diagnosed hospitalized in April for hand cellulitis presented with somnolence falls patient was noted to have worsening renal function with a creatinine up to 7.4 from his baseline of 3.1.  He was also found to have urinary retention.  He was hospitalized for further management.  Nephrology was consulted.  Patient was started on dialysis.   Subjective: Tells me again today that he is thirsty. No other complaints.   Assessment & Plan:   Principal Problem:   ESRD (end stage renal disease)-  SHPT   Chronic combined systolic and diastolic CHF (congestive heart failure) - last ECHO 5/19- EF 45-50% grade 2 D CHF   - R brachiocephalic AVF & Tunneled access placed on 02/04/2018 - fluid management per renal team- renal still feels he is volume overloaded - has a dialysis spot for [place], Thurs, Sat but not ready for d/ per renal team  Active Problems:   H/o Depression with anxiety   Acute encephalopathy- hallucinations - information is gleaned from notes as I did not start to evaluate him until 12/31 - acute encephalopathy on admission apparently resolved once dialysis started and he was eating, drinking, communicating and ambulating with PT - noted to become confused on 12/29 (Ambien given on the night of 12/26 and again on 12/27) -  Psych consulted on 12/30 psych recommending starting PRN Haldol and decreasing Remeron dose from 30 to 7.5 - Haldol started on 12/29 - given multiple doses - Given Ativan @ night on 12/30 and 12/31 (by on call night provider that was called by RN- it was not  ordered by rounding doctors) - at home he is on Buspar BID, Prozac daily, Remeron QHS which have been continued as well - he is quite altered now- I am recommending we stop Haldol and Ativan completely for now- will hold AM dose of Buspar as he just received Ativan in the middle of the night- can cont Prozac and Remeron (at the reduced dose)- Neuro consulted as well for their input- CT head negative- IV thiamine started to see if it would help - EEG unrevealing- see below - 1/2- mental status better today. I feel it was related to Ambien, Haldol, Ativan- Making eye contact, eating again, knows where he is and why he is here  Low grade fever  - 100.9 on 12/31 - has not recurred    Diabetes mellitus, insulin dependent (IDDM), uncontrolled  - Managed at home with Tyler Aas -has not eaten today- sugars in 700-100s- will need to hold Lantus  -can use SSI if sugar is high/ if he eats   Coronary atherosclerosis of native coronary artery- s/p CABG HTN - on ASA and Metoprolol  Urinary retention/ BPH - early on in the admission, a foley was required- this issue subsequently resolved and foley was removed prior to my encounter with him - cont Flomax  Pacemaker / AICD    OSA (obstructive sleep apnea) - underwent a sleep study recently recommended BiPAP due to suboptimal CPAP titration and his outpt providers are pursuing a BiPAP for him   Anemia of chronic disease  - - management per renal with Iron  and Aranesp - renal is recommending transfusion if Hb < 7  Obesity Body mass index is 34.15 kg/m.  On Baclofen at home which has been on hold  DVT prophylaxis: Heparin Code Status: Full code Family Communication:  Disposition Plan:   needs SNF or CIR on d/c Consultants:   nephro  Psych  Vascular surgery  Neuro   Procedures:  AV fistula  Dialysis access  EEG:  Clinical Interpretation: ThisEEG was essentially normal.there was a suggestion of an attenuation of faster frequencies  in the left occipital region suggesting cortical dysfunction, but this is not definite by this exam.Correlation with imaging and clinical exam is advised.  There was no seizure or seizure predisposition recorded on this study. Please note that lack of epileptiform activity on EEG does not preclude the possibility of epilepsy.  Antimicrobials:  Anti-infectives (From admission, onward)   Start     Dose/Rate Route Frequency Ordered Stop   02/14/2018 0600  vancomycin (VANCOCIN) IVPB 1000 mg/200 mL premix  Status:  Discontinued     1,000 mg 200 mL/hr over 60 Minutes Intravenous On call to O.R. 02/25/18 1354 02/25/18 1355   03/02/2018 0600  vancomycin (VANCOCIN) IVPB 1000 mg/200 mL premix     1,000 mg 200 mL/hr over 60 Minutes Intravenous To Short Stay 02/25/18 1355 02/03/2018 1015       Objective: Vitals:   03/06/18 1810 03/06/18 2348 03/07/18 0500 03/07/18 0753  BP: 115/71 108/75  133/75  Pulse: 85 87  (!) 48  Resp:      Temp: 99.1 F (37.3 C) 97.8 F (36.6 C)  98.5 F (36.9 C)  TempSrc: Oral Oral  Oral  SpO2: 94% 92%  92%  Weight:   104.9 kg   Height:        Intake/Output Summary (Last 24 hours) at 03/07/2018 1510 Last data filed at 03/07/2018 1000 Gross per 24 hour  Intake 300 ml  Output -  Net 300 ml   Filed Weights   03/05/18 1112 03/06/18 0500 03/07/18 0500  Weight: 104.6 kg 105.8 kg 104.9 kg    Examination: General exam: Appears comfortable  HEENT: PERRLA, oral mucosa moist, no sclera icterus or thrush Respiratory system: Clear to auscultation. Respiratory effort normal. Cardiovascular system: S1 & S2 heard,  No murmurs  Gastrointestinal system: Abdomen soft, non-tender, nondistended. Normal bowel sound. No organomegaly Central nervous system: Alert and oriented x 3 but still has a termor, twitching and is restless. No focal neurological deficits. Extremities: No cyanosis, clubbing or edema Skin: No rashes or ulcers Psychiatry:  making more eye contact but still has  abnormal affect  Data Reviewed: I have personally reviewed following labs and imaging studies  CBC: Recent Labs  Lab 03/02/18 0242 03/03/18 0215 03/05/18 0719 03/07/18 1242  WBC 9.3 10.8* 9.5 10.5  HGB 7.2* 7.8* 7.4* 8.9*  HCT 24.3* 25.8* 25.2* 29.1*  MCV 87.4 85.7 89.4 91.2  PLT 140* 163 143* 400   Basic Metabolic Panel: Recent Labs  Lab 03/03/18 0215 03/04/18 0254 03/05/18 0239 03/06/18 0259 03/07/18 0231  NA 137 138 143 142 147*  K 4.2 3.8 4.1 3.7 3.4*  CL 100 101 106 105 108  CO2 26 23 24 26 28   GLUCOSE 154* 103* 81 121* 175*  BUN 52* 58* 70* 35* 47*  CREATININE 4.77* 5.00* 5.89* 4.21* 5.41*  CALCIUM 9.1 8.9 9.0 9.0 9.0  PHOS 5.6* 5.3* 4.9* 5.0* 5.4*   GFR: Estimated Creatinine Clearance: 18.6 mL/min (A) (by C-G formula based on SCr  of 5.41 mg/dL (H)). Liver Function Tests: Recent Labs  Lab 03/03/18 0215 03/04/18 0254 03/05/18 0239 03/06/18 0259 03/07/18 0231  ALBUMIN 2.7* 2.7* 2.6* 2.7* 2.6*   No results for input(s): LIPASE, AMYLASE in the last 168 hours. Recent Labs  Lab 03/06/18 1204  AMMONIA 13   Coagulation Profile: No results for input(s): INR, PROTIME in the last 168 hours. Cardiac Enzymes: No results for input(s): CKTOTAL, CKMB, CKMBINDEX, TROPONINI in the last 168 hours. BNP (last 3 results) No results for input(s): PROBNP in the last 8760 hours. HbA1C: No results for input(s): HGBA1C in the last 72 hours. CBG: Recent Labs  Lab 03/06/18 1215 03/06/18 1810 03/06/18 2220 03/07/18 0718 03/07/18 1149  GLUCAP 104* 133* 182* 230* 168*   Lipid Profile: No results for input(s): CHOL, HDL, LDLCALC, TRIG, CHOLHDL, LDLDIRECT in the last 72 hours. Thyroid Function Tests: No results for input(s): TSH, T4TOTAL, FREET4, T3FREE, THYROIDAB in the last 72 hours. Anemia Panel: No results for input(s): VITAMINB12, FOLATE, FERRITIN, TIBC, IRON, RETICCTPCT in the last 72 hours. Urine analysis:    Component Value Date/Time   COLORURINE YELLOW  02/23/2018 0657   APPEARANCEUR HAZY (A) 02/23/2018 0657   LABSPEC 1.025 02/23/2018 0657   PHURINE 5.5 02/23/2018 0657   GLUCOSEU 250 (A) 02/23/2018 0657   HGBUR LARGE (A) 02/23/2018 0657   BILIRUBINUR NEGATIVE 02/23/2018 0657   KETONESUR 15 (A) 02/23/2018 0657   PROTEINUR >300 (A) 02/23/2018 0657   UROBILINOGEN 1.0 08/31/2009 1526   NITRITE NEGATIVE 02/23/2018 0657   LEUKOCYTESUR NEGATIVE 02/23/2018 0657   Sepsis Labs: @LABRCNTIP (procalcitonin:4,lacticidven:4) )No results found for this or any previous visit (from the past 240 hour(s)).       Radiology Studies: Ct Head Wo Contrast  Result Date: 03/06/2018 CLINICAL DATA:  Altered mental status likely related to ethanol/substance abuse. EXAM: CT HEAD WITHOUT CONTRAST TECHNIQUE: Contiguous axial images were obtained from the base of the skull through the vertex without intravenous contrast. COMPARISON:  02/23/2018 FINDINGS: Brain: No evidence of acute infarction, hemorrhage, hydrocephalus, extra-axial collection or mass lesion/mass effect. Vascular: Atherosclerotic calcification Skull: Osteopenic appearance.  No acute finding Sinuses/Orbits: Nasal septum perforation. IMPRESSION: No acute finding Electronically Signed   By: Monte Fantasia M.D.   On: 03/06/2018 16:36      Scheduled Meds: . aspirin EC  81 mg Oral Daily  . busPIRone  10 mg Oral BID  . calcitRIOL  1 mcg Oral QODAY  . calcium acetate  1,334 mg Oral TID WC  . Chlorhexidine Gluconate Cloth  6 each Topical Q0600  . feeding supplement (NEPRO CARB STEADY)  237 mL Oral QID  . FLUoxetine  20 mg Oral Daily  . heparin  5,000 Units Subcutaneous Q8H  . insulin aspart  0-5 Units Subcutaneous QHS  . insulin aspart  0-9 Units Subcutaneous TID WC  . mirtazapine  7.5 mg Oral QHS  . multivitamin  1 tablet Oral QHS  . rosuvastatin  10 mg Oral q1800  . tamsulosin  0.4 mg Oral Daily  . [START ON 03/16/2018] thiamine injection  100 mg Intravenous Daily   Continuous Infusions: .  [START ON 03/10/2018] thiamine injection    . thiamine injection       LOS: 11 days    Time spent in minutes: 35    Debbe Odea, MD [facility] Pager: [email or web address] Password [email or web address] 03/07/2018, 3:10 PM

## 2018-03-08 ENCOUNTER — Inpatient Hospital Stay (HOSPITAL_COMMUNITY): Payer: Medicare Other | Admitting: Certified Registered"

## 2018-03-08 ENCOUNTER — Ambulatory Visit (HOSPITAL_COMMUNITY): Payer: Self-pay | Admitting: Psychiatry

## 2018-03-08 ENCOUNTER — Inpatient Hospital Stay (HOSPITAL_COMMUNITY): Payer: Medicare Other

## 2018-03-08 DIAGNOSIS — I469 Cardiac arrest, cause unspecified: Secondary | ICD-10-CM

## 2018-03-08 LAB — RENAL FUNCTION PANEL
Albumin: 2.8 g/dL — ABNORMAL LOW (ref 3.5–5.0)
Anion gap: 11 (ref 5–15)
BUN: 27 mg/dL — ABNORMAL HIGH (ref 6–20)
CO2: 29 mmol/L (ref 22–32)
Calcium: 9.3 mg/dL (ref 8.9–10.3)
Chloride: 100 mmol/L (ref 98–111)
Creatinine, Ser: 4.15 mg/dL — ABNORMAL HIGH (ref 0.61–1.24)
GFR calc Af Amer: 18 mL/min — ABNORMAL LOW (ref >60)
GFR calc non Af Amer: 15 mL/min — ABNORMAL LOW (ref >60)
Glucose, Bld: 200 mg/dL — ABNORMAL HIGH (ref 70–99)
Phosphorus: 4.8 mg/dL — ABNORMAL HIGH (ref 2.5–4.6)
Potassium: 4 mmol/L (ref 3.5–5.1)
Sodium: 140 mmol/L (ref 135–145)

## 2018-03-08 LAB — CBC WITH DIFFERENTIAL/PLATELET
ABS IMMATURE GRANULOCYTES: 0.31 10*3/uL — AB (ref 0.00–0.07)
Basophils Absolute: 0.1 10*3/uL (ref 0.0–0.1)
Basophils Relative: 0 %
Eosinophils Absolute: 0 10*3/uL (ref 0.0–0.5)
Eosinophils Relative: 0 %
HCT: 29.4 % — ABNORMAL LOW (ref 39.0–52.0)
Hemoglobin: 8.3 g/dL — ABNORMAL LOW (ref 13.0–17.0)
Immature Granulocytes: 3 %
Lymphocytes Relative: 9 %
Lymphs Abs: 1.1 10*3/uL (ref 0.7–4.0)
MCH: 27.5 pg (ref 26.0–34.0)
MCHC: 28.2 g/dL — ABNORMAL LOW (ref 30.0–36.0)
MCV: 97.4 fL (ref 80.0–100.0)
Monocytes Absolute: 0.6 10*3/uL (ref 0.1–1.0)
Monocytes Relative: 5 %
NEUTROS PCT: 83 %
Neutro Abs: 9.2 10*3/uL — ABNORMAL HIGH (ref 1.7–7.7)
PLATELETS: 157 10*3/uL (ref 150–400)
RBC: 3.02 MIL/uL — ABNORMAL LOW (ref 4.22–5.81)
RDW: 16.2 % — ABNORMAL HIGH (ref 11.5–15.5)
WBC: 11.2 10*3/uL — ABNORMAL HIGH (ref 4.0–10.5)
nRBC: 3 % — ABNORMAL HIGH (ref 0.0–0.2)

## 2018-03-08 LAB — BASIC METABOLIC PANEL
Anion gap: 26 — ABNORMAL HIGH (ref 5–15)
BUN: 37 mg/dL — ABNORMAL HIGH (ref 6–20)
CO2: 12 mmol/L — ABNORMAL LOW (ref 22–32)
Calcium: 7.8 mg/dL — ABNORMAL LOW (ref 8.9–10.3)
Chloride: 113 mmol/L — ABNORMAL HIGH (ref 98–111)
Creatinine, Ser: 5.36 mg/dL — ABNORMAL HIGH (ref 0.61–1.24)
GFR calc Af Amer: 13 mL/min — ABNORMAL LOW (ref >60)
GFR calc non Af Amer: 11 mL/min — ABNORMAL LOW (ref >60)
Glucose, Bld: 217 mg/dL — ABNORMAL HIGH (ref 70–99)
Potassium: 4.1 mmol/L (ref 3.5–5.1)
Sodium: 151 mmol/L — ABNORMAL HIGH (ref 135–145)

## 2018-03-08 LAB — GLUCOSE, CAPILLARY
Glucose-Capillary: 193 mg/dL — ABNORMAL HIGH (ref 70–99)
Glucose-Capillary: 290 mg/dL — ABNORMAL HIGH (ref 70–99)
Glucose-Capillary: 182 mg/dL — ABNORMAL HIGH (ref 70–99)

## 2018-03-08 LAB — LACTIC ACID, PLASMA: LACTIC ACID, VENOUS: 14.8 mmol/L — AB (ref 0.5–1.9)

## 2018-03-08 LAB — TROPONIN I: Troponin I: 0.04 ng/mL (ref <0.03)

## 2018-03-08 MED ORDER — SODIUM CHLORIDE 0.9 % IV BOLUS: 250.0000 mL | Freq: Once | INTRAVENOUS | Status: DC

## 2018-03-08 MED ORDER — AMIODARONE HCL IN DEXTROSE 360-4.14 MG/200ML-% IV SOLN
INTRAVENOUS | Status: AC
  Administered 2018-03-08: 23:00:00
  Filled 2018-03-08: qty 400

## 2018-03-08 MED ORDER — LACTULOSE 10 GM/15ML PO SOLN
10.0000 g | Freq: Every day | ORAL | Status: DC
  Administered 2018-03-08: 10 g via ORAL
  Filled 2018-03-08: qty 15

## 2018-03-08 MED ORDER — SODIUM CHLORIDE 0.9 % IV SOLN
1.0000 g | INTRAVENOUS | Status: DC
  Filled 2018-03-08: qty 1

## 2018-03-08 MED ORDER — EPINEPHRINE PF 1 MG/10ML IJ SOSY
PREFILLED_SYRINGE | INTRAMUSCULAR | Status: AC
  Filled 2018-03-08: qty 30

## 2018-03-08 MED ORDER — VANCOMYCIN HCL 10 G IV SOLR
2000.0000 mg | Freq: Once | INTRAVENOUS | Status: DC
  Filled 2018-03-08: qty 2000

## 2018-03-08 MED ORDER — LORAZEPAM 2 MG/ML IJ SOLN
0.5000 mg | Freq: Two times a day (BID) | INTRAMUSCULAR | Status: DC | PRN
  Administered 2018-03-08: 0.5 mg via INTRAVENOUS
  Filled 2018-03-08: qty 1

## 2018-03-08 MED ORDER — HALOPERIDOL LACTATE 5 MG/ML IJ SOLN
2.0000 mg | INTRAMUSCULAR | Status: DC | PRN
  Administered 2018-03-08: 2 mg via INTRAVENOUS
  Filled 2018-03-08 (×2): qty 1

## 2018-03-08 MED ORDER — VASOPRESSIN 20 UNIT/ML IV SOLN
0.0300 [IU]/min | INTRAVENOUS | Status: DC
  Filled 2018-03-08: qty 2

## 2018-03-08 MED ORDER — HALOPERIDOL LACTATE 5 MG/ML IJ SOLN
5.0000 mg | Freq: Once | INTRAMUSCULAR | Status: AC
  Administered 2018-03-08: 5 mg via INTRAMUSCULAR

## 2018-03-08 MED ORDER — VANCOMYCIN HCL 10 G IV SOLR
1500.0000 mg | INTRAVENOUS | Status: DC
  Filled 2018-03-08: qty 1500

## 2018-03-08 MED ORDER — NOREPINEPHRINE 16 MG/250ML-% IV SOLN
0.0000 ug/min | INTRAVENOUS | Status: DC
  Administered 2018-03-08: 100 ug/min via INTRAVENOUS
  Filled 2018-03-08: qty 250

## 2018-03-08 MED ORDER — NOREPINEPHRINE BITARTRATE 1 MG/ML IV SOLN
0.0000 ug/min | INTRAVENOUS | Status: DC
  Filled 2018-03-08: qty 4

## 2018-03-08 MED ORDER — HALOPERIDOL LACTATE 5 MG/ML IJ SOLN: 5.0000 mg | Freq: Four times a day (QID) | INTRAMUSCULAR | Status: DC | PRN

## 2018-03-08 MED ORDER — STERILE WATER FOR INJECTION IV SOLN
INTRAVENOUS | Status: DC
  Administered 2018-03-09: via INTRAVENOUS
  Filled 2018-03-08 (×2): qty 850

## 2018-03-08 NOTE — Progress Notes (Signed)
[name] Progress Note   53y/o DM HTN Bipolar d/o CASHD ICD w/ EF 35% advanced CKD followed by Dr Jolinda Croak at [facility], initiated on dialysis for ESRD this hospitalization. Assessment/ Plan:   1 ESRD 1st HD 02/25/18 ESRD 2/2 DM/HTN - HD on THS schedule: 2K, 4h, TDC, Tight heparin, try for 102kg next Tx - Has [facility]pt [facility]TS spot [facility]  2 Anemia esa/Fe-Completing Fe load, Aranesp 200 mcg IV given 12/31. 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 Check PTH, use 2.0 Ca bath. Phos <5.5 No binders yet 5 Obesity 6 OSA 7 CADs/p CABG 8 Arrhythmias 9 Neuropathy 10 Deconditioning with h/o right leg trauma and hip pain -> SNF or CIR and SW working on. 11 Psychosis - seen by Psych-possible medically related delirium/ metabolic encephalopathy.  H/O bipolar disorder.  12. New R brachiocephalic AVF 10/18/46-JEHU faint bruit lower portion of AVF. VVS has reassess and will f/u 6week post op office duplex.   Subjective:   HD yesterday, 1.9 L net UF CIR eval pending No interval events     Objective:   BP 113/69   Pulse 93   Temp 98 F (36.7 C) (Oral)   Resp 20   Ht 5\' 9"  (1.753 m)   Wt 103.9 kg   SpO2 95%   BMI 33.83 kg/m   Intake/Output Summary (Last 24 hours) at 03/08/2018 0956 Last data filed at 03/08/2018 0300 Gross per 24 hour  Intake 290 ml  Output 1900 ml  Net -1610 ml   Weight change: -0.1 kg  Physical Exam: General:Obese malee NAD Heart: D1,S9, 2/6 systolic M Lungs: Bilateral breath sounds decreased in bases, otherwise CTAB Abdomen: Obese active BS Extremities: trace L pedal edema, trace BLE edema Dialysis Access: [facility]  Imaging: Ct Head Wo Contrast  Result Date: 03/06/2018 CLINICAL DATA:  Altered mental status likely related to ethanol/substance abuse. EXAM: CT HEAD WITHOUT CONTRAST TECHNIQUE: Contiguous axial images were obtained from the base of the skull through the vertex without intravenous contrast. COMPARISON:  02/23/2018 FINDINGS: Brain:  No evidence of acute infarction, hemorrhage, hydrocephalus, extra-axial collection or mass lesion/mass effect. Vascular: Atherosclerotic calcification Skull: Osteopenic appearance.  No acute finding Sinuses/Orbits: Nasal septum perforation. IMPRESSION: No acute finding Electronically Signed   By: Monte Fantasia M.D.   On: 03/06/2018 16:36    Labs: BMET Recent Labs  Lab 03/02/18 0242 03/03/18 0215 03/04/18 0254 03/05/18 0239 03/06/18 0259 03/07/18 0231 03/08/18 0345  NA 138 137 138 143 142 147* 140  K 4.5 4.2 3.8 4.1 3.7 3.4* 4.0  CL 103 100 101 106 105 108 100  CO2 24 26 23 24 26 28 29   GLUCOSE 115* 154* 103* 81 121* 175* 200*  BUN 69* 52* 58* 70* 35* 47* 27*  CREATININE 5.41* 4.77* 5.00* 5.89* 4.21* 5.41* 4.15*  CALCIUM 8.9 9.1 8.9 9.0 9.0 9.0 9.3  PHOS 6.4* 5.6* 5.3* 4.9* 5.0* 5.4* 4.8*   CBC Recent Labs  Lab 03/02/18 0242 03/03/18 0215 03/05/18 0719 03/07/18 1242  WBC 9.3 10.8* 9.5 10.5  HGB 7.2* 7.8* 7.4* 8.9*  HCT 24.3* 25.8* 25.2* 29.1*  MCV 87.4 85.7 89.4 91.2  PLT 140* 163 143* 174    Medications:    . aspirin EC  81 mg Oral Daily  . calcitRIOL  1 mcg Oral QODAY  . calcium acetate  1,334 mg Oral TID WC  . Chlorhexidine Gluconate Cloth  6 each Topical Q0600  . feeding supplement (NEPRO CARB  STEADY)  237 mL Oral QID  . FLUoxetine  20 mg Oral Daily  . heparin  5,000 Units Subcutaneous Q8H  . insulin aspart  0-5 Units Subcutaneous QHS  . insulin aspart  0-9 Units Subcutaneous TID WC  . lactulose  10 g Oral Daily  . mirtazapine  7.5 mg Oral QHS  . multivitamin  1 tablet Oral QHS  . rosuvastatin  10 mg Oral q1800  . tamsulosin  0.4 mg Oral Daily  . [START ON 03/16/2018] thiamine injection  100 mg Intravenous Daily     Pearson Grippe MD 03/08/2018, 9:56 AM

## 2018-03-08 NOTE — Progress Notes (Signed)
Pharmacy Antibiotic Note  Matthew [name] is a 53 y.o. male admitted on 02/09/2018 with somnolence/falls.  Pharmacy has been consulted for Vancomycin/Cefepime dosing for r/o sepsis. WBC 11.2. Lactic acid is 14.8 (pt is s/p cardiac arrest tonight). Pt is new start hemodialysis patient this admit. Currently on TTS schedule, but now hemodynamically unstable post arrest, so unsure what HD plans will be the next few days.   Plan: -Vancomycin 2000 mg IV x 1, then f/u HD plans given hemodynamic instability s/p cardiac arrest  -Cefepime 1g IV q24h -Trend WBC, temp, HD schedule  -Drug levels as indicated   Height: 5\' 9"  (175.3 cm) Weight: 229 lb 0.9 oz (103.9 kg) IBW/kg (Calculated) : 70.7  Temp (24hrs), Avg:98.3 F (36.8 C), Min:98 F (36.7 C), Max:98.6 F (37 C)  Recent Labs  Lab 03/02/18 0242 03/03/18 0215  03/05/18 0239 03/05/18 0719 03/06/18 0259 03/07/18 0231 03/07/18 1242 03/08/18 0345 03/08/18 2155 03/08/18 2156  WBC 9.3 10.8*  --   --  9.5  --   --  10.5  --  11.2*  --   CREATININE 5.41* 4.77*   < > 5.89*  --  4.21* 5.41*  --  4.15* 5.36*  --   LATICACIDVEN  --   --   --   --   --   --   --   --   --   --  14.8*   < > = values in this interval not displayed.    Estimated Creatinine Clearance: 18.7 mL/min (A) (by C-G formula based on SCr of 5.36 mg/dL (H)).    Allergies  Allergen Reactions  . Ace Inhibitors Cough  . Cephalexin Other (See Comments)    Resistant to antibiotic  . Beta Adrenergic Blockers Cough    Narda Bonds 03/08/2018 11:46 PM

## 2018-03-08 NOTE — Plan of Care (Signed)

## 2018-03-08 NOTE — Procedures (Signed)
Arterial Catheter Insertion Procedure Note Undray Allman [id number] Dec 08, 1965  Procedure: Insertion of Arterial Catheter  Indications: Blood pressure monitoring  Procedure Details Consent: Unable to obtain consent because of emergent medical necessity. Time Out: Verified patient identification, verified procedure, site/side was marked, verified correct patient position, special equipment/implants available, medications/allergies/relevent history reviewed, required imaging and test results available.  Performed  Maximum sterile technique was used including antiseptics, cap, gloves, gown, hand hygiene, mask and sheet. Skin prep: Chlorhexidine; local anesthetic administered 20 gauge catheter was inserted into left radial artery using the Seldinger technique. ULTRASOUND GUIDANCE USED: NO Evaluation Blood flow good; BP tracing good. Complications: No apparent complications.   Kellie Moor 03/08/2018

## 2018-03-08 NOTE — Consult Note (Signed)
NAME:  Matthew [name], MRN:  [id number], DOB:  08/22/1965, LOS: 12 ADMISSION DATE:  02/10/2018, CONSULTATION DATE:  03/08/17 REFERRING MD: Alger Simons, CHIEF COMPLAINT:  Cardiac arrest   Brief History   New dialysis, Cardiac arrest  History of present illness   Matthew [name] 53 year old male with a past medical history of coronary artery disease status post bypass, history of cardiac arrest status post pacemaker/AICD, chronic kidney disease stage IV, diabetes mellitus type 2, hyperlipidemia, obstructive sleep apnea who was admitted with confusion and falls.  Nephrology was consulted and he was started on hemodilaysis via a RUE vascath.  Neurology was consulted regarding his confusion and thought it was due to medications and so he was progressively weaned off of these.  On the evening of 03/11/2018 he was noted to be doing well, hemodynamically stable without any specific complaints around 8 PM.  Around 915 his nurse found him unresponsive and reportedly in PEA arrest.  He underwent 1 round of compressions and received a dose of atropine and epinephrine.  After this he achieved return of spontaneous circulation and required escalating doses of vasopressors.  Interventional cardiology saw the patient at bedside and reviewed his EKGs and did not believe there was evidence of active ischemia or that he would benefit from catheterization.  He was transferred to the MICU where he had a bedside ultrasound that did not show strong suspicion for pulmonary embolism however acute myocardial infarction.  He had an arterial line placed in his left radial after which vasopressors were titrated to maintain a mean arterial pressure greater than 65 which consistently required over 100 mcg/min of norepinephrine along with 15 of epinephrine.  During this time he had another episode of cardiac arrest requiring compressions for 1 minute followed by 2 episodes of ventricular tachycardia, 1 of which resolved spontaneously and 1  of which required external defibrillation with return to sinus rhythm.  His pacemaker was reviewed and did not show any shockable rhythms prior to his initial cardiac arrest.  At this time his sister and brother-in-law arrived.  They were explained that the inciting injury was unclear, possibly medication effect followed by aspiration, pulmonary embolism, or acute myocardial infarction, given his massive vasopressor requirement and lactic acid of 14 that it would be unlikely he would survive and if he did would certainly have multiple comorbidities and required a great deal more support than he had previously.  His sister stated that given all this information she believes strongly that her brother would not want to continue in this way and instead would prefer to pursue comfort care measures at this time.  Her brother-in-law agreed.  They stated that his only other family is her father who has advanced dementia, would not be able to understand his condition, and is not in good enough health to travel to the hospital tonight.  Past Medical History  As above  Significant Hospital Events   03/07/18 cardiac arrest  Consults:  none  Procedures:  03/08/18: intubation 13/20 Left radial arterial line placement  Significant Diagnostic Tests:  CXR:  IMPRESSION: 1. Endotracheal tube seen ending 2-3 cm above the carina. 2. Lungs hypoexpanded. Retrocardiac airspace opacification could reflect atelectasis or pneumonia. Suspect small left pleural effusion.  Micro Data:  Blood culture>> Tracheal aspirate>>  Antimicrobials:  Vancomycin 1/4>> Cefepime1/4>>     Objective   Blood pressure 113/69, pulse 93, temperature 98 F (36.7 C), temperature source Oral, resp. rate 20, height 5\' 9"  (1.753 m), weight 103.9 kg, SpO2 95 %.  Intake/Output Summary (Last 24 hours) at 03/08/2018 2301 Last data filed at 03/08/2018 0900 Gross per 24 hour  Intake 352 ml  Output -  Net 352 ml   Filed Weights    03/07/18 1225 03/07/18 1635 03/08/18 0500  Weight: 104.8 kg 102.8 kg 103.9 kg    Examination: Physical Exam  Constitutional:  Acutely ill, pale, lying supine in bed  HENT:  Head: Normocephalic and atraumatic.  Eyes: EOM are normal. No scleral icterus.  Cardiovascular:  No murmur heard. Tachycardic, intermittently in VT  Pulmonary/Chest: No stridor.  Mechanical breath sounds, coarse rhonchi, no stridor or crackles  Abdominal: Soft. He exhibits no distension. There is no abdominal tenderness.  Musculoskeletal:        General: No deformity or edema.  Neurological:  GCS 3. Not withdrawing to pain in all 4 extremities  Skin: Skin is warm and dry.  Psychiatric: Affect normal.     Assessment & Plan:  Matthew [name] 53 year old male with a past medical history of coronary artery disease status post bypass, history of cardiac arrest status post pacemaker/AICD, chronic kidney disease stage IV, diabetes mellitus type 2, hyperlipidemia, obstructive sleep apnea who was admitted with confusion and falls, thought to be due to a combination of uremia as well as medication effect.  Initiated on hemodialysis and weaning on medications.  On the night of 03/08/18 he had a cardiac arrest followed by severe hemodynamic instability requiring very high doses of vasopressors. Lactic acid 14.    Refractory shock Lactic acidosis Hypoxemic/hypercapnic respiratory failure Recurrent VT/PEA arrest Acute on chronic kidney disease - as above, etiology unclear however having refractory VT, PEA arrest, and rising vasopressor requirement. While the inciting insult was unclear, the family was explained the almost certain poor prognosis from this and decided that he would have referred comfort care measures at this time. - all medications/orders for non-comfort care discontinued (no antibiotics, vasopressors, bicarbonate drip - purely for comfort) - comfort care orders instituted with PRN fentanyl, glycopyrrolate,  tylenol, haldol - palliative extubation order placed, nasal cannula for comfort only - I explained to the family that I expect him to pass very quickly after transitioning to comfort care.  They declined chaplain's services and state he does not have any additional family members to call    Best practice:  Diet: as tolerated Pain/Anxiety/Delirium protocol (if indicated): fentanyl VAP protocol (if indicated): not indicated DVT prophylaxis: not indicated GI prophylaxis: not indicated Glucose control: not indicated Mobility: not indicated Code Status: DNR/DNI Family Communication: updated at bedside 03/08/18 Disposition:  ICU  Labs   CBC: Recent Labs  Lab 03/02/18 0242 03/03/18 0215 03/05/18 0719 03/07/18 1242 03/08/18 2155  WBC 9.3 10.8* 9.5 10.5 11.2*  NEUTROABS  --   --   --   --  9.2*  HGB 7.2* 7.8* 7.4* 8.9* 8.3*  HCT 24.3* 25.8* 25.2* 29.1* 29.4*  MCV 87.4 85.7 89.4 91.2 97.4  PLT 140* 163 143* 174 275    Basic Metabolic Panel: Recent Labs  Lab 03/04/18 0254 03/05/18 0239 03/06/18 0259 03/07/18 0231 03/08/18 0345 03/08/18 2155  NA 138 143 142 147* 140 151*  K 3.8 4.1 3.7 3.4* 4.0 4.1  CL 101 106 105 108 100 113*  CO2 23 24 26 28 29  12*  GLUCOSE 103* 81 121* 175* 200* 217*  BUN 58* 70* 35* 47* 27* 37*  CREATININE 5.00* 5.89* 4.21* 5.41* 4.15* 5.36*  CALCIUM 8.9 9.0 9.0 9.0 9.3 7.8*  PHOS 5.3* 4.9* 5.0* 5.4* 4.8*  --  GFR: Estimated Creatinine Clearance: 18.7 mL/min (A) (by C-G formula based on SCr of 5.36 mg/dL (H)). Recent Labs  Lab 03/03/18 0215 03/05/18 0719 03/07/18 1242 03/08/18 2155 03/08/18 2156  WBC 10.8* 9.5 10.5 11.2*  --   LATICACIDVEN  --   --   --   --  14.8*    Liver Function Tests: Recent Labs  Lab 03/04/18 0254 03/05/18 0239 03/06/18 0259 03/07/18 0231 03/08/18 0345  ALBUMIN 2.7* 2.6* 2.7* 2.6* 2.8*   No results for input(s): LIPASE, AMYLASE in the last 168 hours. Recent Labs  Lab 03/06/18 1204  AMMONIA 13    ABG     Component Value Date/Time   PHART 7.250 (L) 02/23/2018 2356   PCO2ART 40.9 02/28/2018 2356   PO2ART 103.0 02/11/2018 2356   HCO3 18.1 (L) 02/07/2018 2356   TCO2 20 (L) 02/23/2018 0003   ACIDBASEDEF 9.0 (H) 02/16/2018 2356   O2SAT 97.0 02/24/2018 2356     Coagulation Profile: No results for input(s): INR, PROTIME in the last 168 hours.  Cardiac Enzymes: Recent Labs  Lab 03/08/18 2155  TROPONINI 0.04*    HbA1C: Hemoglobin A1C  Date/Time Value Ref Range Status  01/18/2016 7.8  Final  08/17/2015 6.2  Final   Hgb A1c MFr Bld  Date/Time Value Ref Range Status  07/01/2017 05:30 AM 9.3 (H) 4.8 - 5.6 % Final    Comment:    (NOTE) Pre diabetes:          5.7%-6.4% Diabetes:              >6.4% Glycemic control for   <7.0% adults with diabetes     CBG: Recent Labs  Lab 03/07/18 1711 03/07/18 2215 03/08/18 0753 03/08/18 1143 03/08/18 1704  GLUCAP 118* 126* 193* 290* 182*    Review of Systems:   Unable to obtain due to AMS  Past Medical History  He,  has a past medical history of Abnormality of gait (04/08/2013), AICD (automatic cardioverter/defibrillator) present, Anxiety, Anxiety and depression, CAD (coronary artery disease), Cardiac arrest - ventricular fibrillation (08/26/2009), Carpal tunnel syndrome of right wrist, Cellulitis (2012), Cellulitis (10/1446), Chronic systolic heart failure ([place]), CKD (chronic kidney disease), CKD stage 4 due to type 2 diabetes mellitus ([place]) (04/11/2016), Coma ([place]), Diabetic coma with ketoacidosis ([place]), Diabetic polyneuropathy ([place]) (04/08/2013), DM2 (diabetes mellitus, type 2) ([place]), Dyslipidemia associated with type 2 diabetes mellitus ([place]) (09/26/2016), Dysrhythmia, Foot drop, bilateral (04/08/2013), Headache(784.0), History of degenerative disc disease, HLD (hyperlipidemia), HTN (hypertension), Hypercholesteremia, Hypertension associated with stage 3 chronic kidney disease due to type 2 diabetes mellitus ([place]) (09/26/2016), Ischemic  cardiomyopathy, Neuropathy, Nocturnal leg cramps, Nocturnal leg cramps (04/02/2017), Obesity, OSA (obstructive sleep apnea) (02/02/2012), Pancreatitis (2008), Periodic limb movement (04/02/2017), Polyneuropathy in diabetes(357.2) (04/08/2013), Presence of permanent cardiac pacemaker, Retinal detachment, RLS (restless legs syndrome) (04/22/2015), Thyroid disease, Type II diabetes mellitus with renal manifestations ([place]) (07/10/2017), Ulnar neuropathy at elbow, Uncontrolled type 2 diabetes mellitus with diabetic neuropathy, with long-term current use of insulin ([place]) (09/26/2016), 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   Surgical History    Past Surgical History:  Procedure Laterality Date  . APPENDECTOMY  mid 1990  . AV FISTULA PLACEMENT Right 02/15/2018   Procedure: Creation of Right arm Brachiocephalic Fistula;  Surgeon: Serafina Mitchell, MD;  Location: [facility];  Service:  Vascular;  Laterality: Right;  . CARDIAC DEFIBRILLATOR PLACEMENT    . CARDIAC SURGERY    . CARPAL TUNNEL WITH CUBITAL TUNNEL  02/22/2012   Procedure: CARPAL TUNNEL WITH CUBITAL TUNNEL;  Surgeon: Roseanne Kaufman, MD;  Location: [facility];  Service: Orthopedics;  Laterality: Right;  Right Carpal Tunnel Release/Right Cubital Tunnel Release and Transposition if Necessary with Flexor Pronator Release  . CATARACT EXTRACTION    . COLONOSCOPY WITH PROPOFOL N/A 11/17/2014   Procedure: COLONOSCOPY WITH PROPOFOL;  Surgeon: Manus Gunning, MD;  Location: [facility];  Service: Gastroenterology;  Laterality: N/A;  . COLONOSCOPY WITH PROPOFOL N/A 01/05/2015   Procedure: COLONOSCOPY WITH PROPOFOL;  Surgeon: Manus Gunning, MD;  Location: [facility];  Service: Gastroenterology;  Laterality:  N/A;  . CORONARY ARTERY BYPASS GRAFT    . coronary artery bypass grafting x4  june 29,2011   x 4  . I&D EXTREMITY Right 06/26/2017   Procedure: IRRIGATION AND DEBRIDEMENT RIGHT FOREARM;  Surgeon: Roseanne Kaufman, MD;  Location: [facility];  Service: Orthopedics;  Laterality: Right;  . I&D EXTREMITY Right 06/28/2017   Procedure: Repeat irrigation and debridement right forearm;  Surgeon: Roseanne Kaufman, MD;  Location: [facility];  Service: Orthopedics;  Laterality: Right;  . I&D EXTREMITY Right 06/30/2017   Procedure: Repeat irrigation and debridement right forearm and reconstruction as necessary;  Surgeon: Roseanne Kaufman, MD;  Location: [facility];  Service: Orthopedics;  Laterality: Right;  60 mins  . INSERTION OF DIALYSIS CATHETER N/A 02/22/2018   Procedure: INSERTION TUNNELED  OF Right Internal jugular DIALYSIS CATHETER;  Surgeon: Serafina Mitchell, MD;  Location: [facility];  Service: Vascular;  Laterality: N/A;  . PACEMAKER PLACEMENT    . RETINAL DETACHMENT SURGERY Bilateral   . RETINAL DETACHMENT SURGERY Bilateral    3 months ago  . right sided abdominal cyst removal    . TONSILLECTOMY  1998  . ULNAR NERVE TRANSPOSITION Right      Social History   reports that he has quit smoking. His smoking use included cigars. His smokeless tobacco use includes snuff. He reports that he does not drink alcohol or use drugs.   Family History   His family history includes Alcohol abuse in his father; Allergies in his father and mother; Anxiety disorder in his sister; Cancer in his maternal grandmother; Colon cancer in his mother; Colon polyps in his father and mother; Dementia in his father and mother; Diabetes in his mother and sister; Heart disease in his sister; Hyperlipidemia in his mother; Hypertension in his mother; Kidney cancer in his mother; Lung cancer in his mother; Schizophrenia in his cousin and paternal uncle; Stroke in his father.   Allergies Allergies  Allergen Reactions  . Ace Inhibitors Cough  .  Cephalexin Other (See Comments)    Resistant to antibiotic  . Beta Adrenergic Blockers Cough     Home Medications  Prior to Admission medications   Medication Sig Start Date End Date Taking? Authorizing Provider  acetaminophen (TYLENOL) 500 MG tablet Take 1,000 mg by mouth every 8 (eight) hours as needed for mild pain.    Yes [provider]  aspirin EC 81 MG tablet Take 81 mg by mouth daily.   Yes [provider]  baclofen (LIORESAL) 20 MG tablet Take 1 tablet (20 mg total) by mouth 3 (three) times daily. Must be seen prior to future refills 02/06/18  Yes Kathrynn Ducking, MD  busPIRone (BUSPAR) 10 MG tablet Take 1 tablet (10 mg total) by mouth 2 (  two) times daily. 11/06/17  Yes Cloria Spring, MD  ergocalciferol (VITAMIN D2) 50000 units capsule Take 1 capsule (50,000 Units total) by mouth once a week. One capsule once weekly 08/16/17  Yes [name], MD  ezetimibe (ZETIA) 10 MG tablet Take 10 mg by mouth daily.   Yes [provider]  FLUoxetine (PROZAC) 20 MG capsule Take 1 capsule (20 mg total) by mouth daily. 11/06/17 11/06/18 Yes Cloria Spring, MD  fluticasone [facility] 50 MCG/ACT nasal spray Place 2 sprays into both nostrils daily. 04/11/17  Yes [name], MD  furosemide (LASIX) 40 MG tablet Take 3 tablets (120 mg total) by mouth daily. Patient taking differently: Take 80 mg by mouth 2 (two) times daily. Takes 1 tablet twice daily --increases to 3 tablets daily with edema 11/23/17 02/23/18 Yes Evans Lance, MD  Insulin Degludec (TRESIBA) 100 UNIT/ML SOLN Inject 40 Units into the skin 2 (two) times daily.    Yes [provider]  Melatonin 10 MG TABS Take 10 mg by mouth at bedtime.   Yes [provider]  methylcellulose (ARTIFICIAL TEARS) 1 % ophthalmic solution Place 1 drop into both eyes 2 (two) times daily as needed (dry eyes).   Yes [provider]  metoprolol succinate (TOPROL-XL) 50 MG 24 hr tablet Take 1 tablet (50  mg total) by mouth daily. Patient taking differently: Take 25 mg by mouth daily.  11/09/17  Yes Baldwin Jamaica, PA-C  mirtazapine (REMERON) 30 MG tablet Take 1 tablet (30 mg total) by mouth at bedtime. 11/06/17  Yes Cloria Spring, MD  Multiple Vitamin (MULTIVITAMIN) tablet Take 1 tablet by mouth daily.     Yes [provider]  nitroGLYCERIN (NITROSTAT) 0.4 MG SL tablet Place 1 tablet (0.4 mg total) under the tongue every 5 (five) minutes as needed for chest pain. 01/21/16 01/26/20 Yes Evans Lance, MD  Propylhexedrine [facility]) INHA Place 1 each into the nose 2 (two) times daily as needed (congestion).    Yes [provider]  rosuvastatin (CRESTOR) 40 MG tablet Take 1 tablet (40 mg total) by mouth daily. 11/09/17  Yes Baldwin Jamaica, PA-C  vitamin C (VITAMIN C) 1000 MG tablet Take 1 tablet (1,000 mg total) by mouth daily. 07/04/17  Yes Roxan Hockey, MD     Critical care time: 120

## 2018-03-08 NOTE — Progress Notes (Signed)
At 2105 walked into pt room.  Pt unresponsive and diaphoretic.  Pupils fixed.  Proceeded to do a sternal rub to arouse him without success.  At 2110 no pulse was palpable and code was called.  Compressions in progress and back board placed under him and pads placed.  See code record. Pt transferred to ICU intubated and on levophed and epi drip.  SChoate RN

## 2018-03-08 NOTE — Consult Note (Signed)
Physical Medicine and Rehabilitation Consult   Reason for Consult: Encephalopathy Referring Physician: Dr. Berle Mull   HPI: Matthew [name] is a 53 y.o. male with history of CAD, cardiac arrest s/p AICD, bipolar disorder, T2DM-insulin dependent with polyneuropathy, DDD with bilateral foot drop, severe OSA, CKD stage IV who was admitted on 02/23/2018 with multiple falls mental status changes, failure to thrive and encephalopathy due to uremia.  UDS positive for marijuana. Dr. Moshe Cipro consulted and questioned whether patient had progressed to ESRD and trial of hemodialysis initiated.  Patient hesitant to start hemodialysis permanently but was agreeable to have right IJ and right brachiocephalic fistula placed by Dr. Trula Slade on 12/24.  Has been awaiting placement  but he developed issues with agitation and hallucination 12/30 therefore baclofen and Ativan DC'd and Remeron decreased.  Psychiatry consulted for input on psychosis as well as medication management due to history of anxiety and depression.  Dr. De Nurse felt that delirium likely medically related--monitor for signs of withdrawal and continue to use Haldol as needed.  Neurology consulted 1/1 due to asterixis and CT head done was negative for acute changes. EEG negative for seizures.  Remeron and ativan d/c and thiamine added to help manage metabolic encephalopathy secondary to medications.  Mentation improving and therapy now recommending CIR due to functional deficits.    Review of Systems  Unable to perform ROS: Mental acuity    Past Medical History:  Diagnosis Date  . Abnormality of gait 04/08/2013  . AICD (automatic cardioverter/defibrillator) present   . Anxiety    stress  . Anxiety and depression   . CAD (coronary artery disease)    a. OOH MI 6/11; presented with acute CHF; hosp course c/b VF arrest with VDRF, etc;   b s/p CABG: L-LAD/Dx, S-OM/dCFX;  c.Nuclear scan 8/12:  Inferior and inf-lat scar with minimal  peri-infarct ischemia, EF 53%.; d.LHC 9/12:  LAD 90%, pD1 (small) 70%, prox large Dx 95%, L-LAD and Dx ok, CFX occluded, S-OM and CFX ok, RCA prox to prox/mid 60-70%.  Medical management     . Cardiac arrest - ventricular fibrillation 08/26/2009  . Carpal tunnel syndrome of right wrist   . Cellulitis 2012   spider bite  . Cellulitis 06/2017   right upper extremity  . Chronic systolic heart failure ([place])   . CKD (chronic kidney disease)   . CKD stage 4 due to type 2 diabetes mellitus ([place]) 04/11/2016  . Coma ([place])   . Diabetic coma with ketoacidosis ([place])    Occured in 1994 where he spent 30 days in a coma . He develpoed infection of his leg and developing pancreatitis during that  time   . Diabetic polyneuropathy ([place]) 04/08/2013  . DM2 (diabetes mellitus, type 2) ([place])   . Dyslipidemia associated with type 2 diabetes mellitus ([place]) 09/26/2016  . Dysrhythmia   . Foot drop, bilateral 04/08/2013  . Headache(784.0)    sinus  . History of degenerative disc disease   . HLD (hyperlipidemia)   . HTN (hypertension)   . Hypercholesteremia   . Hypertension associated with stage 3 chronic kidney disease due to type 2 diabetes mellitus ([place]) 09/26/2016  . Ischemic cardiomyopathy    echo 10/11:  inf-septal and apical HK, EF 35%, mod LAE  . Neuropathy   . Nocturnal leg cramps   . Nocturnal leg cramps 04/02/2017  . Obesity   . OSA (obstructive sleep apnea) 02/02/2012  . Pancreatitis 2008  . Periodic limb movement 04/02/2017  .  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 Presence of permanent cardiac pacemaker   . Retinal detachment    Bilateral, laser surgery on the right  . RLS (restless legs syndrome) 04/22/2015  . Thyroid disease   . Type II diabetes mellitus with renal manifestations ([place]) 07/10/2017  . 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 Uncontrolled type 2 diabetes mellitus with diabetic neuropathy, with long-term current use of insulin ([place]) 09/26/2016  . Uncontrolled type 2 diabetes  mellitus with severe nonproliferative retinopathy and macular edema, without long-term current use of insulin ([place]) 09/26/2016  . Uncontrolled type 2 diabetes mellitus with stage 4 chronic kidney disease, with long-term current use of insulin ([place]) 09/26/2016    Past Surgical History:  Procedure Laterality Date  . APPENDECTOMY  mid 1990  . AV FISTULA PLACEMENT Right 02/15/2018   Procedure: Creation of Right arm Brachiocephalic Fistula;  Surgeon: Serafina Mitchell, MD;  Location: [facility] OR;  Service: Vascular;  Laterality: Right;  . CARDIAC DEFIBRILLATOR PLACEMENT    . CARDIAC SURGERY    . CARPAL TUNNEL WITH CUBITAL TUNNEL  02/22/2012   Procedure: CARPAL TUNNEL WITH CUBITAL TUNNEL;  Surgeon: Roseanne Kaufman, MD;  Location: [place];  Service: Orthopedics;  Laterality: Right;  Right Carpal Tunnel Release/Right Cubital Tunnel Release and Transposition if Necessary with Flexor Pronator Release  . CATARACT EXTRACTION    . COLONOSCOPY WITH PROPOFOL N/A 11/17/2014   Procedure: COLONOSCOPY WITH PROPOFOL;  Surgeon: Manus Gunning, MD;  Location: [facility];  Service: Gastroenterology;  Laterality: N/A;  . COLONOSCOPY WITH PROPOFOL N/A 01/05/2015   Procedure: COLONOSCOPY WITH PROPOFOL;  Surgeon: Manus Gunning, MD;  Location: [facility];  Service: Gastroenterology;  Laterality: N/A;  . CORONARY ARTERY BYPASS GRAFT    . coronary artery bypass grafting x4  june 29,2011   x 4  . I&D EXTREMITY Right 06/26/2017   Procedure: IRRIGATION AND DEBRIDEMENT RIGHT FOREARM;  Surgeon: Roseanne Kaufman, MD;  Location: [place];  Service: Orthopedics;  Laterality: Right;  . I&D EXTREMITY Right 06/28/2017   Procedure: Repeat irrigation and debridement right forearm;  Surgeon: Roseanne Kaufman, MD;  Location: [place];  Service: Orthopedics;  Laterality: Right;  . I&D EXTREMITY Right 06/30/2017   Procedure: Repeat irrigation and debridement right forearm and reconstruction as necessary;  Surgeon: Roseanne Kaufman, MD;   Location: [place];  Service: Orthopedics;  Laterality: Right;  60 mins  . INSERTION OF DIALYSIS CATHETER N/A 02/25/2018   Procedure: INSERTION TUNNELED  OF Right Internal jugular DIALYSIS CATHETER;  Surgeon: Serafina Mitchell, MD;  Location: [place];  Service: Vascular;  Laterality: N/A;  . PACEMAKER PLACEMENT    . RETINAL DETACHMENT SURGERY Bilateral   . RETINAL DETACHMENT SURGERY Bilateral    3 months ago  . right sided abdominal cyst removal    . TONSILLECTOMY  1998  . ULNAR NERVE TRANSPOSITION Right     Family History  Problem Relation Age of Onset  . Allergies Mother   . Diabetes Mother   . Hypertension Mother   . Hyperlipidemia Mother   . Dementia Mother   . Colon cancer Mother   . Lung cancer Mother   . Kidney cancer Mother   . Colon polyps Mother   . Stroke Father   . Allergies Father   . Dementia Father   . Alcohol abuse Father   . Colon polyps Father   . Diabetes Sister   . Heart disease Sister   . Anxiety disorder Sister   .  Cancer Maternal Grandmother        unknown type  . Schizophrenia Paternal Uncle   . 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 Per  reports that he has quit smoking. His smoking use included cigars. His smokeless tobacco use includes snuff. He reports that he does not drink alcohol or use drugs.   Allergies  Allergen Reactions  . Ace Inhibitors Cough  . Cephalexin Other (See Comments)    Resistant to antibiotic  . Beta Adrenergic Blockers Cough    Medications Prior to Admission  Medication Sig Dispense Refill  . acetaminophen (TYLENOL) 500 MG tablet Take 1,000 mg by mouth every 8 (eight) hours as needed for mild pain.     Marland Kitchen aspirin EC 81 MG tablet Take 81 mg by mouth daily.    . baclofen (LIORESAL) 20 MG tablet Take 1 tablet (20 mg total) by mouth 3 (three) times daily. Must be seen prior to future refills 270 tablet 0  .  busPIRone (BUSPAR) 10 MG tablet Take 1 tablet (10 mg total) by mouth 2 (two) times daily. 180 tablet 3  . ergocalciferol (VITAMIN D2) 50000 units capsule Take 1 capsule (50,000 Units total) by mouth once a week. One capsule once weekly 12 capsule 1  . ezetimibe (ZETIA) 10 MG tablet Take 10 mg by mouth daily.    Marland Kitchen FLUoxetine (PROZAC) 20 MG capsule Take 1 capsule (20 mg total) by mouth daily. 90 capsule 3  . fluticasone (FLONASE) 50 MCG/ACT nasal spray Place 2 sprays into both nostrils daily. 16 g 3  . furosemide (LASIX) 40 MG tablet Take 3 tablets (120 mg total) by mouth daily. (Patient taking differently: Take 80 mg by mouth 2 (two) times daily. Takes 1 tablet twice daily --increases to 3 tablets daily with edema) 270 tablet 3  . Insulin Degludec (TRESIBA) 100 UNIT/ML SOLN Inject 40 Units into the skin 2 (two) times daily.     . Melatonin 10 MG TABS Take 10 mg by mouth at bedtime.    . methylcellulose (ARTIFICIAL TEARS) 1 % ophthalmic solution Place 1 drop into both eyes 2 (two) times daily as needed (dry eyes).    . metoprolol succinate (TOPROL-XL) 50 MG 24 hr tablet Take 1 tablet (50 mg total) by mouth daily. (Patient taking differently: Take 25 mg by mouth daily. ) 90 tablet 4  . mirtazapine (REMERON) 30 MG tablet Take 1 tablet (30 mg total) by mouth at bedtime. 90 tablet 3  . Multiple Vitamin (MULTIVITAMIN) tablet Take 1 tablet by mouth daily.      . nitroGLYCERIN (NITROSTAT) 0.4 MG SL tablet Place 1 tablet (0.4 mg total) under the tongue every 5 (five) minutes as needed for chest pain. 25 tablet 1  . Propylhexedrine (BENZEDREX) INHA Place 1 each into the nose 2 (two) times daily as needed (congestion).     . rosuvastatin (CRESTOR) 40 MG tablet Take 1 tablet (40 mg total) by mouth daily. 30 tablet 1  . vitamin C (VITAMIN C) 1000 MG tablet Take 1 tablet (1,000 mg total) by mouth daily. 30 tablet 1    Home: Home Living Family/patient expects to be discharged to:: Private residence Living  Arrangements: Parent Available Help at Discharge: Family, Friend(s) Type of Home: House Home Access: Stairs to enter [employer] of Steps: 2-3 Entrance Stairs-Rails: Left, Right, Can reach both Home Layout: Two level Alternate  Level Stairs-Number of Steps: has chair lift that can transport to second level if needed; flight of stairs Alternate Level Stairs-Rails: Right Bathroom Shower/Tub: Walk-in shower, Chiropodist: Standard Home Equipment: Cane - single point, Environmental consultant - 2 wheels, Shower seat, Bedside commode Additional Comments: Pt reports he can stay on first level and sleep on couch   Functional History: Prior Function Level of Independence: Independent with assistive device(s) Comments: uses cane vs walker for ambulation  Functional Status:  Mobility: Bed Mobility Overal bed mobility: Needs Assistance General bed mobility comments: patient requires max A at this time to sit  EOB with strong posterior lean, able to self correct with verable and tactcile cueing Transfers Overall transfer level: Needs assistance Equipment used: Rolling walker (2 wheeled) Transfers: Sit to/from Stand, [employer] Transfers Sit to Stand: Min guard Stand pivot transfers: Min guard General transfer comment: attempted to stand several times patient unabel to coordinate and sliding off bed. retruend to bed for safety Ambulation/Gait Ambulation/Gait assistance: Min guard, Min assist Gait Distance (Feet): 10 Feet Assistive device: Rolling walker (2 wheeled) Gait Pattern/deviations: Step-through pattern, Decreased stride length, Trunk flexed General Gait Details: Min A at times due to patients balance and altered mental status attempting to sit without warning, stating his sock was stuck when it was not. close chair follow for safety.  Gait velocity: decreased Gait velocity interpretation: <1.31 ft/sec, indicative of household ambulator    ADL: ADL Overall ADL's : Needs  assistance/impaired Eating/Feeding: Independent Grooming: Wash/dry hands, Wash/dry face, Oral care, Brushing hair, Set up, Sitting Upper Body Bathing: Set up, Supervision/ safety, Sitting Lower Body Bathing: Minimal assistance, Sit to/from stand Lower Body Bathing Details (indicate cue type and reason): assist for thoroughness.  Requires rest breaks  Upper Body Dressing : Set up, Sitting Lower Body Dressing: Minimal assistance, Sit to/from stand Toilet Transfer: Min guard, Stand-pivot, BSC, RW Toilet Transfer Details (indicate cue type and reason): close min guard assist due to being tremulous  Toileting- Clothing Manipulation and Hygiene: Minimal assistance, Sit to/from stand Functional mobility during ADLs: Min guard, Rolling walker General ADL Comments: Pt fatigues quickly.  Pt incontinent small amount of stool, had stool covered toilet paper on floor, and demonstrated difficulty performing hygiene thoroughly   Cognition: Cognition Overall Cognitive Status: Impaired/Different from baseline Orientation Level: Oriented X4 Cognition Arousal/Alertness: Awake/alert Behavior During Therapy: WFL for tasks assessed/performed Overall Cognitive Status: Impaired/Different from baseline Area of Impairment: Attention, Safety/judgement, Awareness, Problem solving Current Attention Level: Alternating Safety/Judgement: Decreased awareness of deficits, Decreased awareness of safety Awareness: Intellectual Problem Solving: Requires verbal cues General Comments: patient with metabolic encephalopathy today 1/2 able to follow all commands    Blood pressure 113/69, pulse 93, temperature 98 F (36.7 C), temperature source Oral, resp. rate 20, height 5\' 9"  (1.753 m), weight 103.9 kg, SpO2 95 %. Physical Exam  Nursing note and vitals reviewed. Constitutional: He appears well-developed and well-nourished. He appears lethargic. He is easily aroused. Nasal cannula in place.  Lying in bed with side rails up  X 4 and Telemonitor for safety.   Musculoskeletal:     Comments: Multiple scabs bilateral shins and left elbow.   Neurological: He is easily aroused. He appears lethargic.  Oriented to self and place.  Situation "legs not working". Rambling with mumbling speech and perseveration. Needs cues to stay awake and attend to task. Was able to redirect and he was able to follow simple motor commands briefly. Asterixis noted.      Results for orders  placed or performed during the hospital encounter of 02/16/2018 (from the past 24 hour(s))  Glucose, capillary     Status: Abnormal   Collection Time: 03/07/18 11:49 AM  Result Value Ref Range   Glucose-Capillary 168 (H) 70 - 99 mg/dL  CBC     Status: Abnormal   Collection Time: 03/07/18 12:42 PM  Result Value Ref Range   WBC 10.5 4.0 - 10.5 K/uL   RBC 3.19 (L) 4.22 - 5.81 MIL/uL   Hemoglobin 8.9 (L) 13.0 - 17.0 g/dL   HCT 29.1 (L) 39.0 - 52.0 %   MCV 91.2 80.0 - 100.0 fL   MCH 27.9 26.0 - 34.0 pg   MCHC 30.6 30.0 - 36.0 g/dL   RDW 15.9 (H) 11.5 - 15.5 %   Platelets 174 150 - 400 K/uL   nRBC 0.3 (H) 0.0 - 0.2 %  Glucose, capillary     Status: Abnormal   Collection Time: 03/07/18  5:11 PM  Result Value Ref Range   Glucose-Capillary 118 (H) 70 - 99 mg/dL  Glucose, capillary     Status: Abnormal   Collection Time: 03/07/18 10:15 PM  Result Value Ref Range   Glucose-Capillary 126 (H) 70 - 99 mg/dL  Renal function panel     Status: Abnormal   Collection Time: 03/08/18  3:45 AM  Result Value Ref Range   Sodium 140 135 - 145 mmol/L   Potassium 4.0 3.5 - 5.1 mmol/L   Chloride 100 98 - 111 mmol/L   CO2 29 22 - 32 mmol/L   Glucose, Bld 200 (H) 70 - 99 mg/dL   BUN 27 (H) 6 - 20 mg/dL   Creatinine, Ser 4.15 (H) 0.61 - 1.24 mg/dL   Calcium 9.3 8.9 - 10.3 mg/dL   Phosphorus 4.8 (H) 2.5 - 4.6 mg/dL   Albumin 2.8 (L) 3.5 - 5.0 g/dL   GFR calc non Af Amer 15 (L) >60 mL/min   GFR calc Af Amer 18 (L) >60 mL/min   Anion gap 11 5 - 15  Glucose, capillary      Status: Abnormal   Collection Time: 03/08/18  7:53 AM  Result Value Ref Range   Glucose-Capillary 193 (H) 70 - 99 mg/dL   Ct Head Wo Contrast  Result Date: 03/06/2018 CLINICAL DATA:  Altered mental status likely related to ethanol/substance abuse. EXAM: CT HEAD WITHOUT CONTRAST TECHNIQUE: Contiguous axial images were obtained from the base of the skull through the vertex without intravenous contrast. COMPARISON:  02/23/2018 FINDINGS: Brain: No evidence of acute infarction, hemorrhage, hydrocephalus, extra-axial collection or mass lesion/mass effect. Vascular: Atherosclerotic calcification Skull: Osteopenic appearance.  No acute finding Sinuses/Orbits: Nasal septum perforation. IMPRESSION: No acute finding Electronically Signed   By: Monte Fantasia M.D.   On: 03/06/2018 16:36     Assessment/Plan: Diagnosis: debility/metabolic encephalopathy 1. Does the need for close, 24 hr/day medical supervision in concert with the patient's rehab needs make it unreasonable for this patient to be served in a less intensive setting? No 2. Co-Morbidities requiring supervision/potential complications:   3. Due to bladder management, safety and pain management, does the patient require 24 hr/day rehab nursing? No 4. Does the patient require coordinated care of a physician, rehab nurse, n/a  to address physical and functional deficits in the context of the above medical diagnosis(es)? No Addressing deficits in the following areas: balance, locomotion, transferring, bathing and feeding 5. Can the patient actively participate in an intensive therapy program of at least 3 hrs of therapy per  day at least 5 days per week? No 6. The potential for patient to make measurable gains while on inpatient rehab is fair and poor 7. Anticipated functional outcomes upon discharge from inpatient rehab are n/a  with PT, n/a with OT, n/a with SLP. 8. Estimated rehab length of stay to reach the above functional goals is:  n/a 9. Anticipated D/C setting: Other 10. Anticipated post D/C treatments: N/A 11. Overall Rehab/Functional Prognosis: fair  RECOMMENDATIONS: This patient's condition is appropriate for continued rehabilitative care in the following setting: SNF Patient has agreed to participate in recommended program. N/A Note that insurance prior authorization may be required for reimbursement for recommended care.  Comment:    Meredith Staggers, MD, [place] 03/08/2018    Bary Leriche, PA-C 03/08/2018

## 2018-03-08 NOTE — Transfer of Care (Signed)
Code Blue full arrest with intubation

## 2018-03-08 NOTE — Progress Notes (Signed)
Chaplain responded to Code Comeri­o, 2W room 09.  Pt on dialysis with pacemaker. Chaplain provided ministry of presence and called family members with phone numbers on [place]. Unable to reach pt's father with two numbers available, but left message on sister's phone that [facility] wanted call back.  Sister returned call but may have hung up before speaking with nurse. Will continue to be available to family and staff. Tamsen Snider Pager [phone]

## 2018-03-08 NOTE — Progress Notes (Signed)
IP rehab admissions - Please see rehab md consult recommending SNF placement.  Call me for questions.  [phone]

## 2018-03-08 NOTE — Progress Notes (Signed)
PROGRESS NOTE    Matthew [name]   KDX:[id number]  DOB: 08-23-1965  DOA: 02/03/2018 PCP: Matthew Helper, MD   Brief Narrative:  Matthew [name] 53-year-old male with a past medical history of coronary artery disease status post bypass, history of cardiac arrest status post pacemaker/AICD, chronic kidney disease stage IV, diabetes mellitus type 2, hyperlipidemia, history of obstructive sleep apnea recently diagnosed hospitalized in April for hand cellulitis presented with somnolence falls patient was noted to have worsening renal function with a creatinine up to 7.4 from his baseline of 3.1.  He was also found to have urinary retention.  He was hospitalized for further management.  Nephrology was consulted.  Patient was started on dialysis.   Subjective: In the morning the patient is oriented x4.  Later in the afternoon patient becomes more agitated.  Becomes disoriented as well.  Trying to get out of the bed.  No nausea no vomiting no fever no chills.  Severely diaphoretic.  Assessment & Plan:   Principal Problem:   ESRD (end stage renal disease)-  [facility]   Chronic combined systolic and diastolic CHF (congestive heart failure) - last ECHO 5/19- EF 45-50% grade 2 D CHF   - R brachiocephalic AVF & Tunneled access placed on 02/18/2018 - fluid management per renal team- renal still feels he is volume overloaded - has a dialysis spot for [place], Thurs, Sat, will discharge when medically stable. -Due to severe diaphoresis and severe agitation giving small bolus of 250 cc.  Active Problems:   H/o Depression with anxiety   Acute encephalopathy- hallucinations ?  Serotonin syndrome - acute encephalopathy on admission apparently resolved once dialysis started and he was eating, drinking, communicating and ambulating with PT - noted to become confused on 12/29 (Ambien given on the night of 12/26 and again on 12/27) -  Psych consulted on 12/30 psych recommending starting PRN Haldol and decreasing  Remeron dose from 30 to 7.5 - Haldol started on 12/29 - given multiple doses - at home he is on Buspar BID, Prozac daily, Remeron QHS which have been continued as well -With agitation, tachycardia, and careful apathy and the patient on 3 different serotonergic medication possibility of serotonin syndrome cannot be ruled out. -The patient is reacting paradoxically to IV Ativan.  Will not use it again. -We will continue with IV Haldol as needed. -We will also stop BuSpar, Prozac and Remeron.  I do not suspect that the worsening mentation is secondary to holding these medications which would still be in system from last dose last night. - Neuro consulted as well for their input appreciate their assistance. - CT head negative - IV thiamine started to see if it would help - EEG unrevealing- see below    Diabetes mellitus, insulin dependent (IDDM), uncontrolled  - Managed at home with Tyler Aas -can use SSI if sugar is high/ if he eats   Coronary atherosclerosis of native coronary artery- s/p CABG HTN - on ASA and Metoprolol  Urinary retention/ BPH - early on in the admission, a foley was required- this issue subsequently resolved and foley was removed  - cont Flomax  Pacemaker / AICD    OSA (obstructive sleep apnea) - underwent a sleep study recently recommended BiPAP due to suboptimal CPAP titration and his outpt providers are pursuing a BiPAP for him   Anemia of chronic disease - management per renal with Iron and Aranesp - transfusion if Hb < 7  Obesity Body mass index is 33.83 kg/m.  On Baclofen  at home which has been on hold  DVT prophylaxis: Heparin Code Status: Full code Family Communication:  Disposition Plan:   needs SNF or CIR on d/c Consultants:   nephro  Psych  Vascular surgery  Neuro   Procedures:  AV fistula  Dialysis access  EEG:  Clinical Interpretation: ThisEEG was essentially normal.there was a suggestion of an attenuation of faster frequencies  in the left occipital region suggesting cortical dysfunction, but this is not definite by this exam.Correlation with imaging and clinical exam is advised.  There was no seizure or seizure predisposition recorded on this study. Please note that lack of epileptiform activity on EEG does not preclude the possibility of epilepsy.  Antimicrobials:  Anti-infectives (From admission, onward)   Start     Dose/Rate Route Frequency Ordered Stop   03/05/2018 0600  vancomycin (VANCOCIN) IVPB 1000 mg/200 mL premix  Status:  Discontinued     1,000 mg 200 mL/hr over 60 Minutes Intravenous On call to O.R. 02/25/18 1354 02/25/18 1355   02/13/2018 0600  vancomycin (VANCOCIN) IVPB 1000 mg/200 mL premix     1,000 mg 200 mL/hr over 60 Minutes Intravenous To Short Stay 02/25/18 1355 02/08/2018 1015       Objective: Vitals:   03/07/18 1714 03/07/18 2347 03/08/18 0500 03/08/18 0754  BP: 102/83 (!) 98/57  113/69  Pulse: 64 95  93  Resp:      Temp: 98.6 F (37 C) 98.6 F (37 C)  98 F (36.7 C)  TempSrc: Oral Oral  Oral  SpO2: 98% 94%  95%  Weight:   103.9 kg   Height:        Intake/Output Summary (Last 24 hours) at 03/08/2018 1925 Last data filed at 03/08/2018 0900 Gross per 24 hour  Intake 402 ml  Output -  Net 402 ml   Filed Weights   03/07/18 1225 03/07/18 1635 03/08/18 0500  Weight: 104.8 kg 102.8 kg 103.9 kg    Examination: General exam: Appears comfortable  HEENT: PERRLA, oral mucosa moist, no sclera icterus or thrush Respiratory system: Clear to auscultation. Respiratory effort normal. Cardiovascular system: S1 & S2 heard,  No murmurs  Gastrointestinal system: Abdomen soft, non-tender, nondistended. Normal bowel sound. No organomegaly Central nervous system: Alert and oriented x 3 but still has a termor, twitching and is restless. No focal neurological deficits. Extremities: No cyanosis, clubbing or edema Skin: No rashes or ulcers Psychiatry:  making more eye contact but still has abnormal  affect  Data Reviewed: I have personally reviewed following labs and imaging studies  CBC: Recent Labs  Lab 03/02/18 0242 03/03/18 0215 03/05/18 0719 03/07/18 1242  WBC 9.3 10.8* 9.5 10.5  HGB 7.2* 7.8* 7.4* 8.9*  HCT 24.3* 25.8* 25.2* 29.1*  MCV 87.4 85.7 89.4 91.2  PLT 140* 163 143* 220   Basic Metabolic Panel: Recent Labs  Lab 03/04/18 0254 03/05/18 0239 03/06/18 0259 03/07/18 0231 03/08/18 0345  NA 138 143 142 147* 140  K 3.8 4.1 3.7 3.4* 4.0  CL 101 106 105 108 100  CO2 23 24 26 28 29   GLUCOSE 103* 81 121* 175* 200*  BUN 58* 70* 35* 47* 27*  CREATININE 5.00* 5.89* 4.21* 5.41* 4.15*  CALCIUM 8.9 9.0 9.0 9.0 9.3  PHOS 5.3* 4.9* 5.0* 5.4* 4.8*   GFR: Estimated Creatinine Clearance: 24.2 mL/min (A) (by C-G formula based on SCr of 4.15 mg/dL (H)). Liver Function Tests: Recent Labs  Lab 03/04/18 0254 03/05/18 0239 03/06/18 0259 03/07/18 0231 03/08/18 0345  ALBUMIN 2.7* 2.6* 2.7* 2.6* 2.8*   No results for input(s): LIPASE, AMYLASE in the last 168 hours. Recent Labs  Lab 03/06/18 1204  AMMONIA 13   Coagulation Profile: No results for input(s): INR, PROTIME in the last 168 hours. Cardiac Enzymes: No results for input(s): CKTOTAL, CKMB, CKMBINDEX, TROPONINI in the last 168 hours. BNP (last 3 results) No results for input(s): PROBNP in the last 8760 hours. HbA1C: No results for input(s): HGBA1C in the last 72 hours. CBG: Recent Labs  Lab 03/07/18 1711 03/07/18 2215 03/08/18 0753 03/08/18 1143 03/08/18 1704  GLUCAP 118* 126* 193* 290* 182*   Lipid Profile: No results for input(s): CHOL, HDL, LDLCALC, TRIG, CHOLHDL, LDLDIRECT in the last 72 hours. Thyroid Function Tests: No results for input(s): TSH, T4TOTAL, FREET4, T3FREE, THYROIDAB in the last 72 hours. Anemia Panel: No results for input(s): VITAMINB12, FOLATE, FERRITIN, TIBC, IRON, RETICCTPCT in the last 72 hours. Urine analysis:    Component Value Date/Time   COLORURINE YELLOW 02/23/2018  0657   APPEARANCEUR HAZY (A) 02/23/2018 0657   LABSPEC 1.025 02/23/2018 0657   PHURINE 5.5 02/23/2018 0657   GLUCOSEU 250 (A) 02/23/2018 0657   HGBUR LARGE (A) 02/23/2018 0657   BILIRUBINUR NEGATIVE 02/23/2018 0657   KETONESUR 15 (A) 02/23/2018 0657   PROTEINUR >300 (A) 02/23/2018 0657   UROBILINOGEN 1.0 08/31/2009 1526   NITRITE NEGATIVE 02/23/2018 0657   LEUKOCYTESUR NEGATIVE 02/23/2018 0657   Sepsis Labs: @LABRCNTIP (procalcitonin:4,lacticidven:4) )No results found for this or any previous visit (from the past 240 hour(s)).       Radiology Studies: No results found.    Scheduled Meds: . aspirin EC  81 mg Oral Daily  . calcitRIOL  1 mcg Oral QODAY  . calcium acetate  1,334 mg Oral TID WC  . Chlorhexidine Gluconate Cloth  6 each Topical Q0600  . feeding supplement (NEPRO CARB STEADY)  237 mL Oral QID  . heparin  5,000 Units Subcutaneous Q8H  . insulin aspart  0-5 Units Subcutaneous QHS  . insulin aspart  0-9 Units Subcutaneous TID WC  . lactulose  10 g Oral Daily  . multivitamin  1 tablet Oral QHS  . rosuvastatin  10 mg Oral q1800  . tamsulosin  0.4 mg Oral Daily  . [START ON 03/16/2018] thiamine injection  100 mg Intravenous Daily   Continuous Infusions: . sodium chloride    . [START ON 03/10/2018] thiamine injection    . thiamine injection 500 mg (03/08/18 1231)     LOS: 12 days    Time spent in minutes: 35    Berle Mull, MD [facility] Pager: [email or web address] Password [email or web address] 03/08/2018, 7:25 PM

## 2018-03-09 ENCOUNTER — Inpatient Hospital Stay (HOSPITAL_COMMUNITY): Payer: Medicare Other

## 2018-03-09 LAB — BILIRUBIN, DIRECT: Bilirubin, Direct: 0.6 mg/dL — ABNORMAL HIGH (ref 0.0–0.2)

## 2018-03-09 LAB — COMPREHENSIVE METABOLIC PANEL
ALT: 3165 U/L — ABNORMAL HIGH (ref 0–44)
AST: 3948 U/L — ABNORMAL HIGH (ref 15–41)
Albumin: 2.4 g/dL — ABNORMAL LOW (ref 3.5–5.0)
Alkaline Phosphatase: 110 U/L (ref 38–126)
Anion gap: 19 — ABNORMAL HIGH (ref 5–15)
BUN: 60 mg/dL — ABNORMAL HIGH (ref 6–20)
CO2: 24 mmol/L (ref 22–32)
Calcium: 7.4 mg/dL — ABNORMAL LOW (ref 8.9–10.3)
Chloride: 95 mmol/L — ABNORMAL LOW (ref 98–111)
Creatinine, Ser: 7.31 mg/dL — ABNORMAL HIGH (ref 0.61–1.24)
GFR calc Af Amer: 9 mL/min — ABNORMAL LOW (ref >60)
GFR calc non Af Amer: 8 mL/min — ABNORMAL LOW (ref >60)
Glucose, Bld: 597 mg/dL (ref 70–99)
Potassium: 5.1 mmol/L (ref 3.5–5.1)
Sodium: 138 mmol/L (ref 135–145)
Total Bilirubin: 1.4 mg/dL — ABNORMAL HIGH (ref 0.3–1.2)
Total Protein: 6 g/dL — ABNORMAL LOW (ref 6.5–8.1)

## 2018-03-09 LAB — CBC WITH DIFFERENTIAL/PLATELET
Abs Immature Granulocytes: 1.19 10*3/uL — ABNORMAL HIGH (ref 0.00–0.07)
Basophils Absolute: 0.1 10*3/uL (ref 0.0–0.1)
Basophils Relative: 0 %
Eosinophils Absolute: 0 10*3/uL (ref 0.0–0.5)
Eosinophils Relative: 0 %
HCT: 34.3 % — ABNORMAL LOW (ref 39.0–52.0)
Hemoglobin: 10.8 g/dL — ABNORMAL LOW (ref 13.0–17.0)
Immature Granulocytes: 3 %
Lymphocytes Relative: 5 %
Lymphs Abs: 2 10*3/uL (ref 0.7–4.0)
MCH: 27.9 pg (ref 26.0–34.0)
MCHC: 31.5 g/dL (ref 30.0–36.0)
MCV: 88.6 fL (ref 80.0–100.0)
Monocytes Absolute: 0.9 10*3/uL (ref 0.1–1.0)
Monocytes Relative: 2 %
Neutro Abs: 36.1 10*3/uL — ABNORMAL HIGH (ref 1.7–7.7)
Neutrophils Relative %: 90 %
Platelets: 156 10*3/uL (ref 150–400)
RBC: 3.87 MIL/uL — ABNORMAL LOW (ref 4.22–5.81)
RDW: 17.1 % — ABNORMAL HIGH (ref 11.5–15.5)
WBC: 40.3 10*3/uL — ABNORMAL HIGH (ref 4.0–10.5)
nRBC: 2.9 % — ABNORMAL HIGH (ref 0.0–0.2)

## 2018-03-09 LAB — MRSA PCR SCREENING: MRSA BY PCR: POSITIVE — AB

## 2018-03-09 LAB — POCT I-STAT 3, ART BLOOD GAS (G3+)
Acid-Base Excess: 4 mmol/L — ABNORMAL HIGH (ref 0.0–2.0)
Bicarbonate: 27.6 mmol/L (ref 20.0–28.0)
O2 Saturation: 100 %
Patient temperature: 98.3
TCO2: 29 mmol/L (ref 22–32)
pCO2 arterial: 38.1 mmHg (ref 32.0–48.0)
pH, Arterial: 7.468 — ABNORMAL HIGH (ref 7.350–7.450)
pO2, Arterial: 295 mmHg — ABNORMAL HIGH (ref 83.0–108.0)

## 2018-03-09 LAB — URINALYSIS, ROUTINE W REFLEX MICROSCOPIC
Bilirubin Urine: NEGATIVE
Glucose, UA: >=500 mg/dL — AB
Ketones, ur: NEGATIVE mg/dL
Nitrite: NEGATIVE
Protein, ur: 100 mg/dL — AB
Specific Gravity, Urine: 1.018 (ref 1.005–1.030)
WBC, UA: >50 WBC/hpf — ABNORMAL HIGH (ref 0–5)
pH: 5 (ref 5.0–8.0)

## 2018-03-09 LAB — ABO/RH
ABO/RH(D): O NEG
Weak D: NEGATIVE

## 2018-03-09 LAB — APTT: aPTT: 56 seconds — ABNORMAL HIGH (ref 24–36)

## 2018-03-09 LAB — PROTIME-INR
INR: 2.44
Prothrombin Time: 26.2 seconds — ABNORMAL HIGH (ref 11.4–15.2)

## 2018-03-09 LAB — LACTIC ACID, PLASMA
Lactic Acid, Venous: 4 mmol/L (ref 0.5–1.9)
Lactic Acid, Venous: 5.3 mmol/L (ref 0.5–1.9)

## 2018-03-09 LAB — GLUCOSE, CAPILLARY
Glucose-Capillary: 520 mg/dL (ref 70–99)
Glucose-Capillary: 519 mg/dL (ref 70–99)
Glucose-Capillary: 548 mg/dL (ref 70–99)
Glucose-Capillary: 520 mg/dL (ref 70–99)
Glucose-Capillary: 393 mg/dL — ABNORMAL HIGH (ref 70–99)
Glucose-Capillary: 437 mg/dL — ABNORMAL HIGH (ref 70–99)

## 2018-03-09 LAB — PREPARE RBC (CROSSMATCH)

## 2018-03-09 MED ORDER — MORPHINE BOLUS VIA INFUSION
5.0000 mg | INTRAVENOUS | Status: DC | PRN
  Filled 2018-03-09: qty 5

## 2018-03-09 MED ORDER — GLYCOPYRROLATE 0.2 MG/ML IJ SOLN: 0.2000 mg | INTRAMUSCULAR | Status: DC | PRN

## 2018-03-09 MED ORDER — CHLORHEXIDINE GLUCONATE 0.12% ORAL RINSE (MEDLINE KIT)
15.0000 mL | Freq: Two times a day (BID) | OROMUCOSAL | Status: DC
  Administered 2018-03-09: 15 mL via OROMUCOSAL

## 2018-03-09 MED ORDER — AMIODARONE HCL IN DEXTROSE 360-4.14 MG/200ML-% IV SOLN
60.0000 mg/h | INTRAVENOUS | Status: AC
  Administered 2018-03-09 (×2): 60 mg/h via INTRAVENOUS
  Filled 2018-03-09: qty 200

## 2018-03-09 MED ORDER — NOREPINEPHRINE 16 MG/250ML-% IV SOLN
0.0000 ug/min | INTRAVENOUS | Status: DC
  Administered 2018-03-09 (×2): 40 ug/min via INTRAVENOUS
  Filled 2018-03-09: qty 250

## 2018-03-09 MED ORDER — NOREPINEPHRINE BITARTRATE 1 MG/ML IV SOLN: 2.0000 ug/min | INTRAVENOUS | Status: DC

## 2018-03-09 MED ORDER — DIPHENHYDRAMINE HCL 50 MG/ML IJ SOLN: 25.0000 mg | INTRAMUSCULAR | Status: DC | PRN

## 2018-03-09 MED ORDER — EPINEPHRINE PF 1 MG/ML IJ SOLN
0.5000 ug/min | INTRAVENOUS | Status: DC
  Administered 2018-03-09: 4 ug/min via INTRAVENOUS
  Administered 2018-03-09: 8 ug/min via INTRAVENOUS
  Filled 2018-03-09: qty 4

## 2018-03-09 MED ORDER — SODIUM CHLORIDE 0.9 % IV SOLN
250.0000 mL | INTRAVENOUS | Status: DC
  Administered 2018-03-09: 250 mL via INTRAVENOUS

## 2018-03-09 MED ORDER — ACETAMINOPHEN 650 MG RE SUPP: 650.0000 mg | Freq: Four times a day (QID) | RECTAL | Status: DC | PRN

## 2018-03-09 MED ORDER — GLYCOPYRROLATE 0.2 MG/ML IJ SOLN
0.2000 mg | INTRAMUSCULAR | Status: DC | PRN
  Filled 2018-03-09: qty 1

## 2018-03-09 MED ORDER — FENTANYL CITRATE (PF) 100 MCG/2ML IJ SOLN: 25.0000 ug | INTRAMUSCULAR | Status: DC | PRN

## 2018-03-09 MED ORDER — INSULIN REGULAR(HUMAN) IN NACL 100-0.9 UT/100ML-% IV SOLN
INTRAVENOUS | Status: DC
  Administered 2018-03-09: 4.6 [IU]/h via INTRAVENOUS
  Filled 2018-03-09: qty 100

## 2018-03-09 MED ORDER — INSULIN ASPART 100 UNIT/ML ~~LOC~~ SOLN: 0.0000 [IU] | SUBCUTANEOUS | Status: DC

## 2018-03-09 MED ORDER — AMIODARONE HCL IN DEXTROSE 360-4.14 MG/200ML-% IV SOLN: 30.0000 mg/h | INTRAVENOUS | Status: DC

## 2018-03-09 MED ORDER — DEXTROSE 5 % IV SOLN: INTRAVENOUS | Status: DC

## 2018-03-09 MED ORDER — POLYVINYL ALCOHOL 1.4 % OP SOLN
1.0000 [drp] | Freq: Four times a day (QID) | OPHTHALMIC | Status: DC | PRN
  Filled 2018-03-09: qty 15

## 2018-03-09 MED ORDER — STERILE WATER FOR INJECTION IV SOLN
INTRAVENOUS | Status: DC
  Administered 2018-03-09: 07:00:00 via INTRAVENOUS
  Filled 2018-03-09 (×3): qty 850

## 2018-03-09 MED ORDER — MORPHINE SULFATE (PF) 2 MG/ML IV SOLN: 2.0000 mg | INTRAVENOUS | Status: DC | PRN

## 2018-03-09 MED ORDER — SODIUM CHLORIDE 0.9% FLUSH: 10.0000 mL | INTRAVENOUS | Status: DC | PRN

## 2018-03-09 MED ORDER — FENTANYL 2500MCG IN NS 250ML (10MCG/ML) PREMIX INFUSION
25.0000 ug/h | INTRAVENOUS | Status: DC
  Administered 2018-03-09: 50 ug/h via INTRAVENOUS
  Filled 2018-03-09: qty 250

## 2018-03-09 MED ORDER — GLYCOPYRROLATE 1 MG PO TABS
1.0000 mg | ORAL_TABLET | ORAL | Status: DC | PRN
  Filled 2018-03-09: qty 1

## 2018-03-09 MED ORDER — ACETAMINOPHEN 325 MG PO TABS: 650.0000 mg | ORAL_TABLET | Freq: Four times a day (QID) | ORAL | Status: DC | PRN

## 2018-03-09 MED ORDER — MUPIROCIN 2 % EX OINT: 1.0000 "application " | TOPICAL_OINTMENT | Freq: Two times a day (BID) | CUTANEOUS | Status: DC

## 2018-03-09 MED ORDER — ORAL CARE MOUTH RINSE
15.0000 mL | OROMUCOSAL | Status: DC
  Administered 2018-03-09: 15 mL via OROMUCOSAL

## 2018-03-09 MED ORDER — CHLORHEXIDINE GLUCONATE CLOTH 2 % EX PADS
6.0000 | MEDICATED_PAD | Freq: Every day | CUTANEOUS | Status: DC
  Administered 2018-03-09: 6 via TOPICAL

## 2018-03-09 MED ORDER — SODIUM CHLORIDE 0.9 % IV SOLN: INTRAVENOUS | Status: DC | PRN

## 2018-03-09 MED ORDER — MORPHINE 100MG IN NS 100ML (1MG/ML) PREMIX INFUSION
0.0000 mg/h | INTRAVENOUS | Status: DC
  Administered 2018-03-09: 5 mg/h via INTRAVENOUS
  Filled 2018-03-09: qty 100

## 2018-03-09 MED ORDER — SODIUM CHLORIDE 0.9% IV SOLUTION: Freq: Once | INTRAVENOUS | Status: DC

## 2018-03-09 MED ORDER — SODIUM CHLORIDE 0.9% FLUSH
10.0000 mL | Freq: Two times a day (BID) | INTRAVENOUS | Status: DC
  Administered 2018-03-09: 10 mL

## 2018-03-09 MED FILL — Medication: Qty: 1 | Status: AC

## 2018-03-10 LAB — BPAM RBC
Blood Product Expiration Date: 202001242359
Blood Product Expiration Date: 202001242359
Blood Product Expiration Date: 202001242359
Blood Product Expiration Date: 202001282359
Unit Type and Rh: 9500
Unit Type and Rh: 9500
Unit Type and Rh: 9500
Unit Type and Rh: 9500

## 2018-03-10 LAB — TYPE AND SCREEN
ABO/RH(D): O NEG
Antibody Screen: NEGATIVE
UNIT DIVISION: 0
Unit division: 0
Unit division: 0
Unit division: 0
Weak D: NEGATIVE

## 2018-03-10 MED FILL — Medication: Qty: 1 | Status: AC

## 2018-03-11 ENCOUNTER — Telehealth: Payer: Self-pay | Admitting: *Deleted

## 2018-03-11 LAB — POCT I-STAT 3, ART BLOOD GAS (G3+)
Acid-base deficit: 15 mmol/L — ABNORMAL HIGH (ref 0.0–2.0)
Bicarbonate: 14 mmol/L — ABNORMAL LOW (ref 20.0–28.0)
O2 Saturation: 98 %
Patient temperature: 98.6
TCO2: 15 mmol/L — AB (ref 22–32)
pCO2 arterial: 44.1 mmHg (ref 32.0–48.0)
pH, Arterial: 7.109 — CL (ref 7.350–7.450)
pO2, Arterial: 139 mmHg — ABNORMAL HIGH (ref 83.0–108.0)

## 2018-03-11 LAB — URINE CULTURE: Culture: >=100000 — AB

## 2018-03-11 NOTE — Telephone Encounter (Signed)
-----   Message from Sueanne Margarita, MD sent at 02/20/2018  4:46 PM EST ----- Please let patient know that they had a unssuccessful PAP titration due to severity of OSA.  Please set up in lab study with BiPAP S/T ASAP

## 2018-03-11 NOTE — Telephone Encounter (Signed)
Epic indicates patient is deceased.

## 2018-03-12 ENCOUNTER — Telehealth: Payer: Self-pay | Admitting: *Deleted

## 2018-03-12 NOTE — Telephone Encounter (Signed)
Received D/C from [facility] [employer]-D/C forwarded to Dr.Sood to be signed.

## 2018-03-12 NOTE — Progress Notes (Signed)
Pt very agitated and shaking MD made aware and came to see him multiple times that afternoon and coordinated closely with the nursing team on options to help calm the patient.

## 2018-03-13 ENCOUNTER — Telehealth: Payer: Self-pay | Admitting: *Deleted

## 2018-03-13 NOTE — Telephone Encounter (Signed)
Follow Up: Pt was not established with Dr. Halford Chessman- D/C will be picked up from providers office on 03/13/18. Spoke with the [facility], they will have Pts primary care to sign D/C. Per [facility] D/C can be shredded.

## 2018-03-14 LAB — CULTURE, BLOOD (ROUTINE X 2)
Culture: NO GROWTH
Culture: NO GROWTH
Special Requests: ADEQUATE
Special Requests: ADEQUATE

## 2018-03-14 NOTE — Progress Notes (Signed)
During bedside report, was notified pt was in restraints on 2W. Restraints were discontinued prior to arrival to [facility].

## 2018-03-26 ENCOUNTER — Ambulatory Visit: Payer: Medicare Other

## 2018-04-03 ENCOUNTER — Ambulatory Visit: Payer: Medicare Other | Admitting: Neurology

## 2018-04-06 NOTE — Progress Notes (Signed)
TOD 03-27-18 1333, pt apneic and no heart sounds present. Family at bedside.  Shella Spearing, RN

## 2018-04-06 NOTE — Death Summary Note (Signed)
DEATH SUMMARY   Patient Details  Name: Matthew [name] MRN: [id number] DOB: Sep 03, 1965  Admission/Discharge Information   Admit Date:  03-20-18  Date of Death: Date of Death: 04/04/18  Time of Death: Time of Death: Jun 25, 1331  Length of Stay: 06/12/22  Referring Physician: [name], MD   Reason(s) for Hospitalization  Altered mental status  Diagnoses  Preliminary cause of death:  Secondary Diagnoses (including complications and co-morbidities):  Principal Problem:   ESRD (end stage renal disease) ([place]) Active Problems:   Diabetes mellitus, insulin dependent (IDDM), uncontrolled ([place])   Depression with anxiety   Coronary atherosclerosis of native coronary artery   OSA (obstructive sleep apnea)   Acute encephalopathy   Anemia of chronic disease   Chronic combined systolic and diastolic CHF (congestive heart failure) ([place])   Pressure injury of skin   Brief Hospital Course (including significant findings, care, treatment, and services provided and events leading to death)  Matthew [name] is a 53 y.o. year old male with CAD s/p bypass, hx cardiac arrest s/p PM/AICD, CKD IV, DM2 and OSA who was admitted for fall thought secondary to uremia in the setting of suspected oversedation. Hemodialysis was initiated for electrolyte abnormalities. During hospitalization, he was found unresponsive in PEA arrest and underwent compressions x 1 cycle, receiving atropine and epinephrine. After achieving ROSC, he required pressor support. In the ICU he had a second episode of cardiac arrest requiring ACLS with ROSC. Family arrived at bedside and after discussing his medical condition with Dr. Dellie Catholic, decision was made to transition patient to comfort care with compassionate extubation. Patient expired at April 04, 2018 at 1333.  Cause of death: Acute renal failure secondary to cardiac arrest   Pertinent Labs and Studies  Significant Diagnostic Studies Ct Head Wo Contrast  Result Date:  03/06/2018 CLINICAL DATA:  Altered mental status likely related to ethanol/substance abuse. EXAM: CT HEAD WITHOUT CONTRAST TECHNIQUE: Contiguous axial images were obtained from the base of the skull through the vertex without intravenous contrast. COMPARISON:  02/23/2018 FINDINGS: Brain: No evidence of acute infarction, hemorrhage, hydrocephalus, extra-axial collection or mass lesion/mass effect. Vascular: Atherosclerotic calcification Skull: Osteopenic appearance.  No acute finding Sinuses/Orbits: Nasal septum perforation. IMPRESSION: No acute finding Electronically Signed   By: Monte Fantasia M.D.   On: 03/06/2018 16:36   Ct Head Wo Contrast  Result Date: 02/23/2018 CLINICAL DATA:  Status post fall at home, with lethargy. Concern for head or cervical spine injury. Initial encounter. EXAM: CT HEAD WITHOUT CONTRAST CT CERVICAL SPINE WITHOUT CONTRAST TECHNIQUE: Multidetector CT imaging of the head and cervical spine was performed following the standard protocol without intravenous contrast. Multiplanar CT image reconstructions of the cervical spine were also generated. COMPARISON:  CT of the head performed 07/10/2017 FINDINGS: CT HEAD FINDINGS Brain: No evidence of acute infarction, hemorrhage, hydrocephalus, extra-axial collection or mass lesion/mass effect. The posterior fossa, including the cerebellum, brainstem and fourth ventricle, is within normal limits. The third and lateral ventricles, and basal ganglia are unremarkable in appearance. The cerebral hemispheres are symmetric in appearance, with normal gray-white differentiation. No mass effect or midline shift is seen. Vascular: No hyperdense vessel or unexpected calcification. Skull: There is no evidence of fracture; visualized osseous structures are unremarkable in appearance. Sinuses/Orbits: The visualized portions of the orbits are within normal limits. The paranasal sinuses and mastoid air cells are well-aerated. Other: No significant soft tissue  abnormalities are seen. CT CERVICAL SPINE FINDINGS Alignment: Normal. Skull base and vertebrae: No acute fracture. No primary bone lesion or  focal pathologic process. Soft tissues and spinal canal: No prevertebral fluid or swelling. No visible canal hematoma. Disc levels: Intervertebral disc spaces are preserved. The bony foramina are grossly unremarkable. Upper chest: The visualized portions of the thyroid gland are unremarkable. Mild calcification is noted at the carotid bifurcations bilaterally. Other: No additional soft tissue abnormalities are seen. IMPRESSION: 1. No evidence of traumatic intracranial injury or fracture. 2. No evidence of fracture or subluxation along the cervical spine. 3. Mild calcification at the carotid bifurcations bilaterally. Carotid ultrasound could be considered for further evaluation, when and as deemed clinically appropriate. Electronically Signed   By: Garald Balding M.D.   On: 02/23/2018 01:20   Ct Cervical Spine Wo Contrast  Result Date: 02/23/2018 CLINICAL DATA:  Status post fall at home, with lethargy. Concern for head or cervical spine injury. Initial encounter. EXAM: CT HEAD WITHOUT CONTRAST CT CERVICAL SPINE WITHOUT CONTRAST TECHNIQUE: Multidetector CT imaging of the head and cervical spine was performed following the standard protocol without intravenous contrast. Multiplanar CT image reconstructions of the cervical spine were also generated. COMPARISON:  CT of the head performed 07/10/2017 FINDINGS: CT HEAD FINDINGS Brain: No evidence of acute infarction, hemorrhage, hydrocephalus, extra-axial collection or mass lesion/mass effect. The posterior fossa, including the cerebellum, brainstem and fourth ventricle, is within normal limits. The third and lateral ventricles, and basal ganglia are unremarkable in appearance. The cerebral hemispheres are symmetric in appearance, with normal gray-white differentiation. No mass effect or midline shift is seen. Vascular: No  hyperdense vessel or unexpected calcification. Skull: There is no evidence of fracture; visualized osseous structures are unremarkable in appearance. Sinuses/Orbits: The visualized portions of the orbits are within normal limits. The paranasal sinuses and mastoid air cells are well-aerated. Other: No significant soft tissue abnormalities are seen. CT CERVICAL SPINE FINDINGS Alignment: Normal. Skull base and vertebrae: No acute fracture. No primary bone lesion or focal pathologic process. Soft tissues and spinal canal: No prevertebral fluid or swelling. No visible canal hematoma. Disc levels: Intervertebral disc spaces are preserved. The bony foramina are grossly unremarkable. Upper chest: The visualized portions of the thyroid gland are unremarkable. Mild calcification is noted at the carotid bifurcations bilaterally. Other: No additional soft tissue abnormalities are seen. IMPRESSION: 1. No evidence of traumatic intracranial injury or fracture. 2. No evidence of fracture or subluxation along the cervical spine. 3. Mild calcification at the carotid bifurcations bilaterally. Carotid ultrasound could be considered for further evaluation, when and as deemed clinically appropriate. Electronically Signed   By: Garald Balding M.D.   On: 02/23/2018 01:20   US Renal  Result Date: 02/23/2018 CLINICAL DATA:  Acute on chronic kidney injury. History of diabetes and hypertension. EXAM: RENAL / URINARY TRACT ULTRASOUND COMPLETE COMPARISON:  Renal ultrasound June 29, 2017 FINDINGS: Right Kidney: Renal measurements: 10.7 x 6 x 7 x 6 1 cm = volume: 224.1 mL . Echogenicity within normal limits. Mild cortical thinning. No mass or hydronephrosis visualized. Left Kidney: Renal measurements: 12.1 x 5.9 x 6.9 cm = volume: 256 mL. Echogenicity within normal limits. No mass or hydronephrosis visualized. Bladder: Appears normal for degree of bladder distention. IMPRESSION: 1. No obstructive uropathy. 2. Mild cortical thinning.  Electronically Signed   By: Elon Alas M.D.   On: 02/23/2018 05:10   Dg Chest Port 1 View  Result Date: 03/08/2018 CLINICAL DATA:  Acute onset of shortness of breath. Status post CPR. EXAM: PORTABLE CHEST 1 VIEW COMPARISON:  Chest radiograph performed 02/15/2018 FINDINGS: The patient's endotracheal tube is  seen ending 2-3 cm above the carina. A right IJ dual-lumen catheter is noted ending about the distal SVC. The lungs are hypoexpanded. Retrocardiac airspace opacification could reflect atelectasis or pneumonia. A small left pleural effusion is suspected. No pneumothorax is seen. The cardiomediastinal silhouette is borderline normal in size. The patient is status post median sternotomy. There are chronic fractures of the 3 superiormost sternal wires. A pacemaker/AICD is noted overlying the left chest wall, with leads ending overlying the right atrium and right ventricle. External pacing pads are noted. No acute osseous abnormalities are identified. IMPRESSION: 1. Endotracheal tube seen ending 2-3 cm above the carina. 2. Lungs hypoexpanded. Retrocardiac airspace opacification could reflect atelectasis or pneumonia. Suspect small left pleural effusion. Electronically Signed   By: Garald Balding M.D.   On: 03/08/2018 22:33   Dg Chest Port 1 View  Result Date: 02/20/2018 CLINICAL DATA:  Dialysis catheter placement. EXAM: PORTABLE CHEST 1 VIEW COMPARISON:  Chest x-ray dated February 23, 2018. FINDINGS: Interval placement of a tunneled right internal jugular dialysis catheter with the tip in the distal SVC. Unchanged left chest wall pacemaker. Stable cardiomegaly status post CABG. Mild pulmonary vascular congestion. No focal consolidation, pleural effusion, or pneumothorax. No acute osseous abnormality. IMPRESSION: 1. Tunneled right internal jugular dialysis catheter with tip in the distal SVC. No pneumothorax. 2. Stable cardiomegaly.  Mild pulmonary vascular congestion. Electronically Signed   By: Titus Dubin M.D.   On: 03/01/2018 11:49   Dg Chest Portable 1 View  Result Date: 02/23/2018 CLINICAL DATA:  Mechanical fall this afternoon. EXAM: PORTABLE CHEST 1 VIEW COMPARISON:  Chest radiograph Jul 10, 2017 FINDINGS: Cardiac silhouette is mildly enlarged and unchanged. Status post median sternotomy with multiple proximal fractured sternotomy wires. LEFT AICD in situ. No pleural effusion or focal consolidation. No pneumothorax. Osseous structures are non suspicious. IMPRESSION: 1. Stable cardiomegaly.  No acute pulmonary process. Electronically Signed   By: Elon Alas M.D.   On: 02/23/2018 01:18   Dg Abd Portable 1v  Result Date: 03-18-18 CLINICAL DATA:  Orogastric tube placement EXAM: PORTABLE ABDOMEN - 1 VIEW COMPARISON:  11/24/2013 FINDINGS: NG tube is in place. The tip is in the fundus of the stomach. There are no disproportionally dilated loops of bowel. There is no obvious free intraperitoneal gas. IMPRESSION: NG tube tip is in the fundus of the stomach. Electronically Signed   By: Marybelle Killings M.D.   On: Mar 18, 2018 10:42   Dg Fluoro Guide Cv Line Right  Result Date: 02/21/2018 CLINICAL DATA:  Intraoperative imaging during dialysis catheter insertion. EXAM: CENTRAL VENOUS CATHETER WITH FLUOROSCOPY CONTRAST:  See operative report FLUOROSCOPY TIME:  Fluoroscopy Time:  2 minutes 33 seconds Radiation Exposure Index (if provided by the fluoroscopic device): None available Number of Acquired Spot Images: Subtraction imaging performed. COMPARISON:  None. FINDINGS: Intraoperative digital subtraction imaging performed of the chest. This demonstrates catheter within the SVC. Contrast injection confirms patency of the SVC. No central venous occlusion. IMPRESSION: Patent SVC. Electronically Signed   By: Jerilynn Mages.  Shick M.D.   On: 02/03/2018 10:23   Vas [place] Upper Ext Vein Mapping (pre-op Avf)  Result Date: 02/17/2018 UPPER EXTREMITY VEIN MAPPING  Indications: Pre-access. History: CKD.  Limitations:  patient cannot keep still during the exam Comparison Study: No prior study available Performing Technologist: Rudell Cobb  Examination Guidelines: A complete evaluation includes B-mode imaging, spectral Doppler, color Doppler, and power Doppler as needed of all accessible portions of each vessel. Bilateral testing is considered an integral part  of a complete examination. Limited examinations for reoccurring indications may be performed as noted. +-----------------+-------------+----------+----------------------------------+ Right Cephalic   Diameter (cm)Depth (cm)             Findings              +-----------------+-------------+----------+----------------------------------+ Shoulder             0.49        1.28                                      +-----------------+-------------+----------+----------------------------------+ Prox upper arm       0.40        1.07               branching              +-----------------+-------------+----------+----------------------------------+ Mid upper arm        0.42        1.05   branching and Partially thrombosed +-----------------+-------------+----------+----------------------------------+ Dist upper arm       0.45        0.66               branching              +-----------------+-------------+----------+----------------------------------+ Antecubital fossa    0.26        0.70               branching              +-----------------+-------------+----------+----------------------------------+ Prox forearm         0.26        0.49                                      +-----------------+-------------+----------+----------------------------------+ Mid forearm          0.27        0.57                                      +-----------------+-------------+----------+----------------------------------+ Dist forearm         0.35        0.35        branching and thrombosed       +-----------------+-------------+----------+----------------------------------+ Wrist                                             not visualized           +-----------------+-------------+----------+----------------------------------+ +-----------------+-------------+----------+--------------+ Right Basilic    Diameter (cm)Depth (cm)   Findings    +-----------------+-------------+----------+--------------+ Shoulder                                not visualized +-----------------+-------------+----------+--------------+ Prox upper arm       0.23        0.90                  +-----------------+-------------+----------+--------------+ Mid upper arm        0.22        0.45                  +-----------------+-------------+----------+--------------+  Dist upper arm       0.19        0.29                  +-----------------+-------------+----------+--------------+ Antecubital fossa    0.21        0.24     branching    +-----------------+-------------+----------+--------------+ Prox forearm         0.41        0.39     branching    +-----------------+-------------+----------+--------------+ Mid forearm          0.27        0.44     branching    +-----------------+-------------+----------+--------------+ Distal forearm       0.28        0.35     branching    +-----------------+-------------+----------+--------------+ Elbow                                   not visualized +-----------------+-------------+----------+--------------+ Wrist                                   not visualized +-----------------+-------------+----------+--------------+ *See table(s) above for measurements and observations.  Diagnosing physician: Harold Barban MD Electronically signed by Harold Barban MD on 02/09/2018 at 10:57:16 AM.    Final     Microbiology Recent Results (from the past 240 hour(s))  Urine Culture     Status: Abnormal   Collection Time: 2018-03-17  6:30 AM  Result Value  Ref Range Status   Specimen Description URINE, RANDOM  Final   Special Requests   Final    NONE Performed at [facility], [address] [place], [place] [address]    Culture >=100,000 COLONIES/mL ESCHERICHIA COLI (A)  Final   Report Status 03/11/2018 FINAL  Final   Organism ID, Bacteria ESCHERICHIA COLI (A)  Final      Susceptibility   Escherichia coli - MIC*    AMPICILLIN 4 SENSITIVE Sensitive     CEFAZOLIN <=4 SENSITIVE Sensitive     CEFTRIAXONE <=1 SENSITIVE Sensitive     CIPROFLOXACIN <=0.25 SENSITIVE Sensitive     GENTAMICIN <=1 SENSITIVE Sensitive     IMIPENEM <=0.25 SENSITIVE Sensitive     NITROFURANTOIN <=16 SENSITIVE Sensitive     TRIMETH/SULFA <=20 SENSITIVE Sensitive     AMPICILLIN/SULBACTAM 4 SENSITIVE Sensitive     PIP/TAZO <=4 SENSITIVE Sensitive     Extended ESBL NEGATIVE Sensitive     * >=100,000 COLONIES/mL ESCHERICHIA COLI  Culture, blood (routine x 2)     Status: None   Collection Time: 17-Mar-2018  8:01 AM  Result Value Ref Range Status   Specimen Description BLOOD LEFT ANTECUBITAL  Final   Special Requests AEROBIC BOTTLE ONLY Blood Culture adequate volume  Final   Culture   Final    NO GROWTH 5 DAYS Performed at [facility], [place] [address], [place], [place] [address]    Report Status 03/14/2018 FINAL  Final  Culture, blood (routine x 2)     Status: None   Collection Time: 03-17-2018  8:10 AM  Result Value Ref Range Status   Specimen Description BLOOD BLOOD LEFT HAND  Final   Special Requests AEROBIC BOTTLE ONLY Blood Culture adequate volume  Final   Culture   Final    NO GROWTH 5 DAYS Performed at [facility], [place] [address], [place], [place] [address]    Report Status 03/14/2018 FINAL  Final  MRSA PCR Screening     Status: Abnormal   Collection Time: 04-03-18  8:58 AM  Result Value Ref Range Status   MRSA by PCR POSITIVE (A) NEGATIVE Final    Comment:        The GeneXpert MRSA Assay (FDA approved for NASAL specimens only), is  one component of a comprehensive MRSA colonization surveillance program. It is not intended to diagnose MRSA infection nor to guide or monitor treatment for MRSA infections. RESULT CALLED TO, READ BACK BY AND VERIFIED WITH: RN P TIMCHECK [id number] AT 7741 AM BY CM Performed at [facility], [place] [address], [place], [place] [address]     Lab Basic Metabolic Panel: Recent Labs  Lab 03/08/18 0345 03/08/18 2155 Apr 03, 2018 0712  NA 140 151* 138  K 4.0 4.1 5.1  CL 100 113* 95*  CO2 29 12* 24  GLUCOSE 200* 217* 597*  BUN 27* 37* 60*  CREATININE 4.15* 5.36* 7.31*  CALCIUM 9.3 7.8* 7.4*  PHOS 4.8*  --   --    Liver Function Tests: Recent Labs  Lab 03/08/18 0345 Apr 03, 2018 0712  AST  --  3,948*  ALT  --  3,165*  ALKPHOS  --  110  BILITOT  --  1.4*  PROT  --  6.0*  ALBUMIN 2.8* 2.4*   No results for input(s): LIPASE, AMYLASE in the last 168 hours. No results for input(s): AMMONIA in the last 168 hours. CBC: Recent Labs  Lab 03/08/18 2155 04-03-18 0858  WBC 11.2* 40.3*  NEUTROABS 9.2* 36.1*  HGB 8.3* 10.8*  HCT 29.4* 34.3*  MCV 97.4 88.6  PLT 157 156   Cardiac Enzymes: Recent Labs  Lab 03/08/18 2155  TROPONINI 0.04*   Sepsis Labs: Recent Labs  Lab 03/08/18 2155 03/08/18 2156 04-03-2018 0712 03-Apr-2018 0858 April 03, 2018 1107  WBC 11.2*  --   --  40.3*  --   LATICACIDVEN  --  14.8* 4.0*  --  5.3*    Procedures/Operations  EEG 03/06/18 Arterial line 03/08/18 Dialysis catheter placement 03/01/2018   Sayf Kerner Rodman Pickle 03/14/2018, 10:38 PM

## 2018-04-06 NOTE — Progress Notes (Signed)
Compassionate extubation. Family at bedside. Medications turned off. Magnet over AICD per MD Loanne Drilling.  Matthew [name] Matthew [name]

## 2018-04-06 NOTE — Progress Notes (Signed)
Patient was extubated per MD order for compassionate extubation.

## 2018-04-06 NOTE — Progress Notes (Signed)
CDS referral called in to [facility], referral #: [id number].

## 2018-04-06 NOTE — Progress Notes (Signed)
[facility] Progress Note Patient Name: Matthew [name] DOB: 06-Jan-1966 MRN: [id number]   Date of Service  Mar 17, 2018  HPI/Events of Note  Bed side RN asking for resuming all cardiac, pressure meds and reverse DNR since accepted for organ donation and family agrees.  But I do not see any documentation regarding this on chart.  eICU Interventions  I have asked to contact Nursing supervisor for further plan of action, notes from organ donation discussion with family on epic chart before ordering above.      Intervention Category Minor Interventions: Communication with other healthcare providers and/or family  [name] March 17, 2018, 5:21 AM

## 2018-04-06 NOTE — Progress Notes (Signed)
[facility] Progress Note   52y/o DM HTN Bipolar d/o CASHD ICD w/ EF 35% advanced CKD followed by Dr Jolinda Croak at [facility], initiated on dialysis for ESRD this hospitalization. Assessment/ Plan:   1 ESRD 1st HD 02/25/18 ESRD 2/2 DM/HTN - If seems likley to be liver donor can support with HD through the procedure; f/u labs today  2. PEA / VT Cardiac Arrest and VDRF 03/08/17, plan to withdraw after assessement as liver donor  2 Anemia esa/Fe-Fe load, Aranesp 200 mcg IV given 12/31. 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 Check PTH, use 2.0 Ca bath. Phos <5.5 5 Obesity 6 OSA 7 CADs/p CABG 8 Arrhythmias 9 Neuropathy 10 Deconditioning with h/o right leg trauma and hip pain  11 Psychosis - seen by Psych-possible medically related delirium/ metabolic encephalopathy.  H/O bipolar disorder.  12. New R brachiocephalic AVF 87/86/76-HMCN faint bruit lower portion of AVF. VVS has reassess and will f/u 6week post op office duplex.   Subjective:     PEA arrest yesterday evening, initially transitioned to comfort measures but now being considered for liver donation; workup in process  AM labs pending     Objective:   BP (!) 120/104   Pulse (!) 101   Temp 98.3 F (36.8 C) (Oral)   Resp 20   Ht 5\' 9"  (1.753 m)   Wt 103.9 kg   SpO2 99%   BMI 33.83 kg/m   Intake/Output Summary (Last 24 hours) at March 16, 2018 4709 Last data filed at Mar 16, 2018 0300 Gross per 24 hour  Intake 121.28 ml  Output -  Net 121.28 ml   Weight change:   Physical Exam: General:Obese malee intubated and sdeated Heart: G2,E3, 2/6 systolic M Lungs: coarse breath sounds b/l Abdomen: Obese active BS Extremities: trace L pedal edema, trace BLE edema Dialysis Access: [facility]  Imaging: Dg Chest Port 1 View  Result Date: 03/08/2018 CLINICAL DATA:  Acute onset of shortness of breath. Status post CPR. EXAM: PORTABLE CHEST 1 VIEW COMPARISON:  Chest radiograph performed 02/18/2018 FINDINGS: The  patient's endotracheal tube is seen ending 2-3 cm above the carina. A right IJ dual-lumen catheter is noted ending about the distal SVC. The lungs are hypoexpanded. Retrocardiac airspace opacification could reflect atelectasis or pneumonia. A small left pleural effusion is suspected. No pneumothorax is seen. The cardiomediastinal silhouette is borderline normal in size. The patient is status post median sternotomy. There are chronic fractures of the 3 superiormost sternal wires. A pacemaker/AICD is noted overlying the left chest wall, with leads ending overlying the right atrium and right ventricle. External pacing pads are noted. No acute osseous abnormalities are identified. IMPRESSION: 1. Endotracheal tube seen ending 2-3 cm above the carina. 2. Lungs hypoexpanded. Retrocardiac airspace opacification could reflect atelectasis or pneumonia. Suspect small left pleural effusion. Electronically Signed   By: Garald Balding M.D.   On: 03/08/2018 22:33    Labs: BMET Recent Labs  Lab 03/03/18 0215 03/04/18 0254 03/05/18 0239 03/06/18 0259 03/07/18 0231 03/08/18 0345 03/08/18 2155  NA 137 138 143 142 147* 140 151*  K 4.2 3.8 4.1 3.7 3.4* 4.0 4.1  CL 100 101 106 105 108 100 113*  CO2 26 23 24 26 28 29  12*  GLUCOSE 154* 103* 81 121* 175* 200* 217*  BUN 52* 58* 70* 35* 47* 27* 37*  CREATININE 4.77* 5.00* 5.89* 4.21* 5.41* 4.15* 5.36*  CALCIUM 9.1 8.9 9.0 9.0 9.0 9.3 7.8*  PHOS 5.6* 5.3* 4.9* 5.0*  5.4* 4.8*  --    CBC Recent Labs  Lab 03/03/18 0215 03/05/18 0719 03/07/18 1242 03/08/18 2155  WBC 10.8* 9.5 10.5 11.2*  NEUTROABS  --   --   --  9.2*  HGB 7.8* 7.4* 8.9* 8.3*  HCT 25.8* 25.2* 29.1* 29.4*  MCV 85.7 89.4 91.2 97.4  PLT 163 143* 174 157    Medications:    . sodium chloride   Intravenous Once  . EPINEPHrine         Pearson Grippe MD 28-Mar-2018, 8:14 AM

## 2018-04-06 NOTE — Code Documentation (Signed)
  Patient Name: Matthew [name]   MRN: [id number]   Date of Birth/ Sex: November 01, 1965 , male      Admission Date: 02/15/2018  Attending Provider: Lavina Hamman, MD  Primary Diagnosis: ESRD (end stage renal disease) [name]   Indication: Pt was in his usual state of health until this PM, when he was noted to be in PEA. Code blue was subsequently called. At the time of arrival on scene, ACLS protocol was underway.   Technical Description:  - CPR performance duration:  19 minutes  - Was defibrillation or cardioversion used? No   - Was external pacer placed? No  - Was patient intubated pre/post CPR? Yes   Medications Administered: Y = Yes; Blank = No Amiodarone    Atropine  Y  Calcium    Epinephrine  Y  Lidocaine    Magnesium    Norepinephrine  Y  Phenylephrine    Sodium bicarbonate  Y  Vasopressin     Post CPR evaluation:  - Final Status - Was patient successfully resuscitated ? Yes - What is current rhythm? NSR - What is current hemodynamic status? stable  Miscellaneous Information:  - Labs sent, including: BMP, CBC, lactate, troponin, CXR  - Primary team notified?  Yes  - Family Notified? Attempted, no answer and voicemail box full  - Additional notes/ transfer status: Transferred to 2H11     Isabelle Course, MD  04/04/2018, 12:08 AM [phone]

## 2018-04-06 NOTE — Progress Notes (Signed)
MD Loanne Drilling aware of critical lactic 4.0 and elevated CBGs. No additional orders at this time. Will continue to monitor.  Shella Spearing, RN

## 2018-04-06 NOTE — Progress Notes (Signed)
RN at bedside for assessment. Pupil changes observed with left pupil size 19mm, right pupil size 43mm, nonreactive & fixed. Epinephrine, norepinephrine, amiodarone & bicarb gtt infusing at this time. CBG: 520 CDS coordinator on site. Elink MD notified of changes & request clarification on orders. Comfort care orders currently in place.

## 2018-04-06 NOTE — Progress Notes (Signed)
CCM at bedside discussing goals of care with pt sister & brother-in-law. Family decided to change to DNR status & transition comfort care measures only.

## 2018-04-06 NOTE — Progress Notes (Signed)
47ml morphine gtt wasted with Cybill Eckelmann RN.  Shella Spearing, RN

## 2018-04-06 NOTE — Progress Notes (Signed)
Pt sister reports speaking to CDS for possible organ donation. Pt sister relayed decision to continue supportive care of pt for organ donation. CDS coordinator to arrive on site shortly. RN notified CCM Dr. Mali Kloefkorn.

## 2018-04-06 NOTE — Progress Notes (Signed)
Short run vtach at 2304 on monitor, no loss of pulse. 2305 vfib arrest, pulseless, code called, chest compressions started. Return of pulse at 2306. 2 amps bicarb, amio bolus & gtt started. Another episode of vtach at 2309, pt defibrillated at [place] with return to sinus rhythm, then afib. CCM at bedside with family. Assessment ongoing.

## 2018-04-06 NOTE — Progress Notes (Signed)
Pt transfer from 2W to [facility] post cardiac arrest in bed, on zoll monitor with pads in place, on ventilator, epinephrine & levophed gtt infusing (see MAR). Bedside report given by 2W RN. CCM & Cardiology at bedside. Care assumed at this time.

## 2018-04-06 NOTE — Plan of Care (Signed)
I spoke with CDS representative Neoma Laming who confirms that the family told them that they wanted to proceed with organ donation.  Mali Mortimer Bair, MD [facility] Pulmonology/Critical Care Pager [phone] After hours pager: [phone]

## 2018-04-06 NOTE — Progress Notes (Signed)
At 2109 RN was called by orientee to pt room. Pt was unresponsive. RN attempted to sternal rub pt and could not find a pulse. A code blue was then called and compressions started.

## 2018-04-06 NOTE — Accreditation Note (Signed)
Restraints reported to CMS  Pursuant to regulation 482.13 (G) (3) use of restraints was logged and CMS was notified via email on 03/12/2018.

## 2018-04-06 NOTE — Progress Notes (Signed)
[facility] Progress Note Patient Name: Matthew [name] DOB: 23-May-1965 MRN: [id number]   Date of Service  Mar 23, 2018  HPI/Events of Note  Dr Mali confirmed with organ donation. Bed side asking for orders.   eICU Interventions  As per donation protocol meds and Screen,  ordered. Amiodarone/bicarb/levophed renewed.      Intervention Category Minor Interventions: Other:  [name] March 23, 2018, 6:38 AM

## 2018-04-06 NOTE — Progress Notes (Signed)
Fentanyl gtt wasted 257mL witnessed with Rogelia Rohrer, RN.

## 2018-04-06 DEATH — deceased

## 2018-04-08 ENCOUNTER — Encounter (HOSPITAL_COMMUNITY): Payer: Self-pay

## 2018-05-21 ENCOUNTER — Ambulatory Visit: Payer: Self-pay | Admitting: Family Medicine

## 2019-12-12 IMAGING — NM NM PULMONARY VENT & PERF
11 series · 11 of 11 positions shown · non-contrast
Comparison: Chest radiographs 0344 hours today.

CLINICAL DATA: 51-year-old male with confusion and shortness of
breath. Surgical treatment a few weeks ago of right forearm and
wrist
necrotizing fasciitis.

EXAM:
NUCLEAR MEDICINE VENTILATION - PERFUSION LUNG SCAN
TECHNIQUE: Ventilation images were obtained in multiple projections using
inhaled aerosol Wc-BBm DTPA. Perfusion images were obtained in
multiple projections after intravenous injection of Nc-FFm-HMM.
RADIOPHARMACEUTICALS:  30 mCi of Wc-BBm DTPA aerosol inhalation and
4 mCi XcWWm-IHH IV

[Series 1: ant/post vent · 4.14mm/px · 1 of 1 slices shown]
[im 1/1]
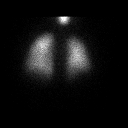

[Series 2: lao/rpo vent · 4.14mm/px · 1 of 1 slices shown (1 of 2)]
[im 1/1]
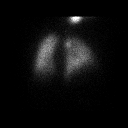

[Series 2: lao/rpo vent · 4.14mm/px · 1 of 1 slices shown (2 of 2)]
[im 1/1]
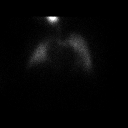

[Series 3: lpo/rao vent · 4.14mm/px · 1 of 1 slices shown (1 of 2)]
[im 1/1]
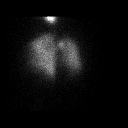

[Series 3: lpo/rao vent · 4.14mm/px · 1 of 1 slices shown (2 of 2)]
[im 1/1]
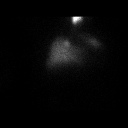

[Series 4: lt lat/rt lat vent · 4.14mm/px · 1 of 1 slices shown (1 of 2)]
[im 1/1]
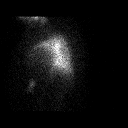

[Series 4: lt lat/rt lat vent · 4.14mm/px · 1 of 1 slices shown (2 of 2)]
[im 1/1]
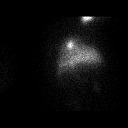

[Series 5: lt lat/rt lat perf · 4.14mm/px · 1 of 1 slices shown (1 of 2)]
[im 1/1]
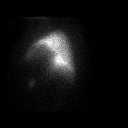

[Series 5: lt lat/rt lat perf · 4.14mm/px · 1 of 1 slices shown (2 of 2)]
[im 1/1]
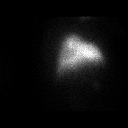

[Series 6: lpo/rao perf · 4.14mm/px · 1 of 1 slices shown (1 of 2)]
[im 1/1]
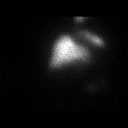

[Series 6: lpo/rao perf · 4.14mm/px · 1 of 1 slices shown (2 of 2)]
[im 1/1]
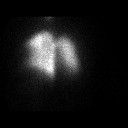

[11 of 11 positions shown; findings below may reference images not displayed]

FINDINGS: Cardiomegaly related photopenia.

Ventilation: Good ventilation radiotracer distribution. No focal
ventilation defect. Incidental gastric contamination.

Perfusion: Homogeneous perfusion radiotracer activity. No perfusion
defect.
IMPRESSION: Normal VQ scan.  No evidence of pulmonary embolus.
